# Patient Record
Sex: Female | Born: 1935 | Race: White | Hispanic: No | Marital: Married | Smoking: Never smoker
Health system: Southern US, Community
[De-identification: ages and names within clinical notes are randomized; demographics above are authoritative.]

## PROBLEM LIST (undated history)

## (undated) DIAGNOSIS — K219 Gastro-esophageal reflux disease without esophagitis: Secondary | ICD-10-CM

## (undated) DIAGNOSIS — C801 Malignant (primary) neoplasm, unspecified: Secondary | ICD-10-CM

## (undated) DIAGNOSIS — I10 Essential (primary) hypertension: Secondary | ICD-10-CM

## (undated) DIAGNOSIS — R011 Cardiac murmur, unspecified: Secondary | ICD-10-CM

## (undated) HISTORY — PX: TONSILLECTOMY: SUR1361

## (undated) HISTORY — PX: TUBAL LIGATION: SHX77

## (undated) HISTORY — PX: COLONOSCOPY W/ POLYPECTOMY: SHX1380

---

## 1984-05-28 HISTORY — PX: BREAST BIOPSY: SHX20

## 2012-01-12 DIAGNOSIS — I10 Essential (primary) hypertension: Secondary | ICD-10-CM | POA: Diagnosis not present

## 2012-01-12 DIAGNOSIS — L97909 Non-pressure chronic ulcer of unspecified part of unspecified lower leg with unspecified severity: Secondary | ICD-10-CM | POA: Diagnosis not present

## 2012-01-15 DIAGNOSIS — Z23 Encounter for immunization: Secondary | ICD-10-CM | POA: Diagnosis not present

## 2012-01-19 DIAGNOSIS — Z1231 Encounter for screening mammogram for malignant neoplasm of breast: Secondary | ICD-10-CM | POA: Diagnosis not present

## 2012-01-19 DIAGNOSIS — Z13820 Encounter for screening for osteoporosis: Secondary | ICD-10-CM | POA: Diagnosis not present

## 2012-05-24 DIAGNOSIS — M899 Disorder of bone, unspecified: Secondary | ICD-10-CM | POA: Diagnosis not present

## 2012-05-24 DIAGNOSIS — M949 Disorder of cartilage, unspecified: Secondary | ICD-10-CM | POA: Diagnosis not present

## 2012-05-24 DIAGNOSIS — I10 Essential (primary) hypertension: Secondary | ICD-10-CM | POA: Diagnosis not present

## 2012-05-24 DIAGNOSIS — E559 Vitamin D deficiency, unspecified: Secondary | ICD-10-CM | POA: Diagnosis not present

## 2012-06-22 DIAGNOSIS — I119 Hypertensive heart disease without heart failure: Secondary | ICD-10-CM | POA: Diagnosis not present

## 2012-06-22 DIAGNOSIS — I359 Nonrheumatic aortic valve disorder, unspecified: Secondary | ICD-10-CM | POA: Diagnosis not present

## 2012-08-31 DIAGNOSIS — M259 Joint disorder, unspecified: Secondary | ICD-10-CM | POA: Diagnosis not present

## 2012-09-08 DIAGNOSIS — D485 Neoplasm of uncertain behavior of skin: Secondary | ICD-10-CM | POA: Diagnosis not present

## 2012-09-08 DIAGNOSIS — C44711 Basal cell carcinoma of skin of unspecified lower limb, including hip: Secondary | ICD-10-CM | POA: Diagnosis not present

## 2012-10-05 DIAGNOSIS — L905 Scar conditions and fibrosis of skin: Secondary | ICD-10-CM | POA: Diagnosis not present

## 2012-10-05 DIAGNOSIS — C44711 Basal cell carcinoma of skin of unspecified lower limb, including hip: Secondary | ICD-10-CM | POA: Diagnosis not present

## 2012-11-18 DIAGNOSIS — I10 Essential (primary) hypertension: Secondary | ICD-10-CM | POA: Diagnosis not present

## 2012-11-18 DIAGNOSIS — E559 Vitamin D deficiency, unspecified: Secondary | ICD-10-CM | POA: Diagnosis not present

## 2012-11-18 DIAGNOSIS — E785 Hyperlipidemia, unspecified: Secondary | ICD-10-CM | POA: Diagnosis not present

## 2012-11-18 DIAGNOSIS — Z5181 Encounter for therapeutic drug level monitoring: Secondary | ICD-10-CM | POA: Diagnosis not present

## 2012-11-18 DIAGNOSIS — N390 Urinary tract infection, site not specified: Secondary | ICD-10-CM | POA: Diagnosis not present

## 2012-11-21 DIAGNOSIS — M899 Disorder of bone, unspecified: Secondary | ICD-10-CM | POA: Diagnosis not present

## 2012-11-21 DIAGNOSIS — M949 Disorder of cartilage, unspecified: Secondary | ICD-10-CM | POA: Diagnosis not present

## 2012-11-21 DIAGNOSIS — I10 Essential (primary) hypertension: Secondary | ICD-10-CM | POA: Diagnosis not present

## 2013-02-20 DIAGNOSIS — M899 Disorder of bone, unspecified: Secondary | ICD-10-CM | POA: Diagnosis not present

## 2013-02-20 DIAGNOSIS — E559 Vitamin D deficiency, unspecified: Secondary | ICD-10-CM | POA: Diagnosis not present

## 2013-03-13 DIAGNOSIS — Z1231 Encounter for screening mammogram for malignant neoplasm of breast: Secondary | ICD-10-CM | POA: Diagnosis not present

## 2013-03-15 DIAGNOSIS — D126 Benign neoplasm of colon, unspecified: Secondary | ICD-10-CM | POA: Diagnosis not present

## 2013-03-15 DIAGNOSIS — Z1211 Encounter for screening for malignant neoplasm of colon: Secondary | ICD-10-CM | POA: Diagnosis not present

## 2013-05-11 DIAGNOSIS — I789 Disease of capillaries, unspecified: Secondary | ICD-10-CM | POA: Diagnosis not present

## 2013-05-11 DIAGNOSIS — D1801 Hemangioma of skin and subcutaneous tissue: Secondary | ICD-10-CM | POA: Diagnosis not present

## 2013-05-11 DIAGNOSIS — L570 Actinic keratosis: Secondary | ICD-10-CM | POA: Diagnosis not present

## 2013-05-11 DIAGNOSIS — L723 Sebaceous cyst: Secondary | ICD-10-CM | POA: Diagnosis not present

## 2013-06-26 DIAGNOSIS — H6120 Impacted cerumen, unspecified ear: Secondary | ICD-10-CM | POA: Diagnosis not present

## 2013-06-28 DIAGNOSIS — I359 Nonrheumatic aortic valve disorder, unspecified: Secondary | ICD-10-CM | POA: Diagnosis not present

## 2013-06-28 DIAGNOSIS — I10 Essential (primary) hypertension: Secondary | ICD-10-CM | POA: Diagnosis not present

## 2013-06-28 DIAGNOSIS — I059 Rheumatic mitral valve disease, unspecified: Secondary | ICD-10-CM | POA: Diagnosis not present

## 2013-08-30 DIAGNOSIS — H52209 Unspecified astigmatism, unspecified eye: Secondary | ICD-10-CM | POA: Diagnosis not present

## 2013-08-30 DIAGNOSIS — H903 Sensorineural hearing loss, bilateral: Secondary | ICD-10-CM | POA: Diagnosis not present

## 2013-08-30 DIAGNOSIS — H902 Conductive hearing loss, unspecified: Secondary | ICD-10-CM | POA: Diagnosis not present

## 2013-08-30 DIAGNOSIS — H5200 Hypermetropia, unspecified eye: Secondary | ICD-10-CM | POA: Diagnosis not present

## 2013-08-30 DIAGNOSIS — H9319 Tinnitus, unspecified ear: Secondary | ICD-10-CM | POA: Diagnosis not present

## 2013-08-30 DIAGNOSIS — H6120 Impacted cerumen, unspecified ear: Secondary | ICD-10-CM | POA: Diagnosis not present

## 2013-08-30 DIAGNOSIS — H524 Presbyopia: Secondary | ICD-10-CM | POA: Diagnosis not present

## 2013-08-30 DIAGNOSIS — H2510 Age-related nuclear cataract, unspecified eye: Secondary | ICD-10-CM | POA: Diagnosis not present

## 2013-11-16 DIAGNOSIS — M899 Disorder of bone, unspecified: Secondary | ICD-10-CM | POA: Diagnosis not present

## 2013-11-16 DIAGNOSIS — I10 Essential (primary) hypertension: Secondary | ICD-10-CM | POA: Diagnosis not present

## 2013-11-16 DIAGNOSIS — I359 Nonrheumatic aortic valve disorder, unspecified: Secondary | ICD-10-CM | POA: Diagnosis not present

## 2013-11-16 DIAGNOSIS — Z5181 Encounter for therapeutic drug level monitoring: Secondary | ICD-10-CM | POA: Diagnosis not present

## 2013-11-29 DIAGNOSIS — M899 Disorder of bone, unspecified: Secondary | ICD-10-CM | POA: Diagnosis not present

## 2013-11-29 DIAGNOSIS — H6120 Impacted cerumen, unspecified ear: Secondary | ICD-10-CM | POA: Diagnosis not present

## 2013-11-29 DIAGNOSIS — I359 Nonrheumatic aortic valve disorder, unspecified: Secondary | ICD-10-CM | POA: Diagnosis not present

## 2013-11-29 DIAGNOSIS — Z23 Encounter for immunization: Secondary | ICD-10-CM | POA: Diagnosis not present

## 2013-11-29 DIAGNOSIS — D126 Benign neoplasm of colon, unspecified: Secondary | ICD-10-CM | POA: Diagnosis not present

## 2013-11-29 DIAGNOSIS — I10 Essential (primary) hypertension: Secondary | ICD-10-CM | POA: Diagnosis not present

## 2013-11-29 DIAGNOSIS — E559 Vitamin D deficiency, unspecified: Secondary | ICD-10-CM | POA: Diagnosis not present

## 2014-05-09 DIAGNOSIS — B351 Tinea unguium: Secondary | ICD-10-CM | POA: Diagnosis not present

## 2014-05-09 DIAGNOSIS — L570 Actinic keratosis: Secondary | ICD-10-CM | POA: Diagnosis not present

## 2014-05-09 DIAGNOSIS — D1801 Hemangioma of skin and subcutaneous tissue: Secondary | ICD-10-CM | POA: Diagnosis not present

## 2014-11-07 ENCOUNTER — Other Ambulatory Visit: Payer: Self-pay | Admitting: Internal Medicine

## 2014-11-07 DIAGNOSIS — I351 Nonrheumatic aortic (valve) insufficiency: Secondary | ICD-10-CM | POA: Diagnosis not present

## 2014-11-07 DIAGNOSIS — I10 Essential (primary) hypertension: Secondary | ICD-10-CM | POA: Diagnosis not present

## 2014-11-07 DIAGNOSIS — Z803 Family history of malignant neoplasm of breast: Secondary | ICD-10-CM | POA: Diagnosis not present

## 2014-11-07 DIAGNOSIS — Z23 Encounter for immunization: Secondary | ICD-10-CM | POA: Diagnosis not present

## 2014-11-07 DIAGNOSIS — Z1231 Encounter for screening mammogram for malignant neoplasm of breast: Secondary | ICD-10-CM

## 2014-12-06 ENCOUNTER — Ambulatory Visit
Admission: RE | Admit: 2014-12-06 | Discharge: 2014-12-06 | Disposition: A | Discharge status: Home / Self Care | Payer: Medicare Other | Source: Ambulatory Visit | Attending: Internal Medicine | Admitting: Internal Medicine

## 2014-12-06 ENCOUNTER — Other Ambulatory Visit: Payer: Self-pay | Admitting: Internal Medicine

## 2014-12-06 DIAGNOSIS — Z1231 Encounter for screening mammogram for malignant neoplasm of breast: Secondary | ICD-10-CM

## 2015-02-26 DIAGNOSIS — H2513 Age-related nuclear cataract, bilateral: Secondary | ICD-10-CM | POA: Diagnosis not present

## 2015-03-19 DIAGNOSIS — I10 Essential (primary) hypertension: Secondary | ICD-10-CM | POA: Diagnosis not present

## 2015-03-19 DIAGNOSIS — Z79899 Other long term (current) drug therapy: Secondary | ICD-10-CM | POA: Diagnosis not present

## 2015-03-19 DIAGNOSIS — Z803 Family history of malignant neoplasm of breast: Secondary | ICD-10-CM | POA: Diagnosis not present

## 2015-03-19 DIAGNOSIS — I351 Nonrheumatic aortic (valve) insufficiency: Secondary | ICD-10-CM | POA: Diagnosis not present

## 2015-03-19 DIAGNOSIS — Z0001 Encounter for general adult medical examination with abnormal findings: Secondary | ICD-10-CM | POA: Diagnosis not present

## 2015-03-19 DIAGNOSIS — Z1389 Encounter for screening for other disorder: Secondary | ICD-10-CM | POA: Diagnosis not present

## 2015-03-19 DIAGNOSIS — E559 Vitamin D deficiency, unspecified: Secondary | ICD-10-CM | POA: Diagnosis not present

## 2015-03-19 DIAGNOSIS — K635 Polyp of colon: Secondary | ICD-10-CM | POA: Diagnosis not present

## 2015-05-16 ENCOUNTER — Other Ambulatory Visit: Payer: Self-pay | Admitting: Gastroenterology

## 2015-07-30 ENCOUNTER — Encounter (HOSPITAL_COMMUNITY): Payer: Self-pay | Admitting: *Deleted

## 2015-08-06 ENCOUNTER — Ambulatory Visit (HOSPITAL_COMMUNITY)
Admission: RE | Admit: 2015-08-06 | Discharge: 2015-08-06 | Disposition: A | Discharge status: Home / Self Care | Payer: Medicare Other | Source: Ambulatory Visit | Attending: Gastroenterology | Admitting: Gastroenterology

## 2015-08-06 ENCOUNTER — Ambulatory Visit (HOSPITAL_COMMUNITY): Payer: Medicare Other | Admitting: Anesthesiology

## 2015-08-06 ENCOUNTER — Encounter (HOSPITAL_COMMUNITY)
Admission: RE | Disposition: A | Discharge status: Home / Self Care | Payer: Self-pay | Source: Ambulatory Visit | Attending: Gastroenterology

## 2015-08-06 ENCOUNTER — Encounter (HOSPITAL_COMMUNITY): Payer: Self-pay

## 2015-08-06 DIAGNOSIS — I351 Nonrheumatic aortic (valve) insufficiency: Secondary | ICD-10-CM | POA: Diagnosis not present

## 2015-08-06 DIAGNOSIS — Z86010 Personal history of colonic polyps: Secondary | ICD-10-CM | POA: Insufficient documentation

## 2015-08-06 DIAGNOSIS — I10 Essential (primary) hypertension: Secondary | ICD-10-CM | POA: Insufficient documentation

## 2015-08-06 DIAGNOSIS — K219 Gastro-esophageal reflux disease without esophagitis: Secondary | ICD-10-CM | POA: Insufficient documentation

## 2015-08-06 DIAGNOSIS — Z1211 Encounter for screening for malignant neoplasm of colon: Secondary | ICD-10-CM | POA: Diagnosis not present

## 2015-08-06 HISTORY — DX: Cardiac murmur, unspecified: R01.1

## 2015-08-06 HISTORY — DX: Malignant (primary) neoplasm, unspecified: C80.1

## 2015-08-06 HISTORY — DX: Essential (primary) hypertension: I10

## 2015-08-06 HISTORY — DX: Gastro-esophageal reflux disease without esophagitis: K21.9

## 2015-08-06 HISTORY — PX: COLONOSCOPY WITH PROPOFOL: SHX5780

## 2015-08-06 SURGERY — COLONOSCOPY WITH PROPOFOL
Anesthesia: Monitor Anesthesia Care

## 2015-08-06 MED ORDER — PROPOFOL 10 MG/ML IV BOLUS
INTRAVENOUS | Status: DC | PRN
  Administered 2015-08-06: 100 mg via INTRAVENOUS
  Administered 2015-08-06 (×2): 50 mg via INTRAVENOUS

## 2015-08-06 MED ORDER — LACTATED RINGERS IV SOLN
INTRAVENOUS | Status: DC
  Administered 2015-08-06: 11:00:00 via INTRAVENOUS
  Administered 2015-08-06: 1000 mL via INTRAVENOUS

## 2015-08-06 MED ORDER — PROPOFOL 10 MG/ML IV BOLUS
INTRAVENOUS | Status: AC
  Filled 2015-08-06: qty 20

## 2015-08-06 MED ORDER — SODIUM CHLORIDE 0.9 % IV SOLN: INTRAVENOUS | Status: DC

## 2015-08-06 SURGICAL SUPPLY — 21 items

## 2015-08-06 NOTE — Op Note (Signed)
Procedure: Surveillance colonoscopy. 03/15/2013 colonoscopy performed with removal of a 15 mm sigmoid colon tubulovillous adenomatous polyp  Endoscopist: Earle Gell  Premedication: Propofol administered by anesthesia  Procedure: The patient was placed in the left lateral decubitus position. Anal inspection and digital rectal exam were normal. The Pentax pediatric colonoscope was introduced into the rectum and advanced to the cecum. A normal-appearing appendiceal orifice and ileocecal valve were identified. Colonic preparation for the exam today was good. Withdrawal time was 10 minutes  Rectum. Normal. Retroflex view of the distal rectum was normal  Sigmoid colon and descending colon. Normal  Splenic flexure. Normal  Transverse colon. Normal  Hepatic flexure. Normal  Ascending colon. Normal  Cecum and ileocecal valve. Normal  Assessment: Normal surveillance colonoscopy.

## 2015-08-06 NOTE — Anesthesia Preprocedure Evaluation (Addendum)
Anesthesia Evaluation  Patient identified by MRN, date of birth, ID band Patient awake    Reviewed: Allergy & Precautions, NPO status , Patient's Chart, lab work & pertinent test results  History of Anesthesia Complications Negative for: history of anesthetic complications  Airway Mallampati: II  TM Distance: >3 FB Neck ROM: Full    Dental no notable dental hx. (+) Dental Advisory Given   Pulmonary neg pulmonary ROS,  breath sounds clear to auscultation  Pulmonary exam normal       Cardiovascular hypertension, Pt. on medications Normal cardiovascular examRhythm:Regular Rate:Normal     Neuro/Psych negative neurological ROS  negative psych ROS   GI/Hepatic Neg liver ROS, GERD-  Medicated,  Endo/Other  negative endocrine ROS  Renal/GU negative Renal ROS  negative genitourinary   Musculoskeletal negative musculoskeletal ROS (+)   Abdominal   Peds negative pediatric ROS (+)  Hematology negative hematology ROS (+)   Anesthesia Other Findings   Reproductive/Obstetrics negative OB ROS                           Anesthesia Physical Anesthesia Plan  ASA: II  Anesthesia Plan: MAC   Post-op Pain Management:    Induction: Intravenous  Airway Management Planned: Nasal Cannula  Additional Equipment:   Intra-op Plan:   Post-operative Plan: Extubation in OR  Informed Consent: I have reviewed the patients History and Physical, chart, labs and discussed the procedure including the risks, benefits and alternatives for the proposed anesthesia with the patient or authorized representative who has indicated his/her understanding and acceptance.   Dental advisory given  Plan Discussed with: CRNA  Anesthesia Plan Comments:         Anesthesia Quick Evaluation

## 2015-08-06 NOTE — Anesthesia Postprocedure Evaluation (Signed)
  Anesthesia Post-op Note  Patient: Tracy Preston  Procedure(s) Performed: Procedure(s) (LRB): COLONOSCOPY WITH PROPOFOL (N/A)  Patient Location: PACU  Anesthesia Type: MAC  Level of Consciousness: awake and alert   Airway and Oxygen Therapy: Patient Spontanous Breathing  Post-op Pain: mild  Post-op Assessment: Post-op Vital signs reviewed, Patient's Cardiovascular Status Stable, Respiratory Function Stable, Patent Airway and No signs of Nausea or vomiting  Last Vitals:  Filed Vitals:   08/06/15 1210  BP: 106/42  Pulse: 62  Temp:   Resp: 14    Post-op Vital Signs: stable   Complications: No apparent anesthesia complications

## 2015-08-06 NOTE — Transfer of Care (Signed)
Immediate Anesthesia Transfer of Care Note  Patient: Tracy Preston  Procedure(s) Performed: Procedure(s): COLONOSCOPY WITH PROPOFOL (N/A)  Patient Location: PACU  Anesthesia Type:MAC  Level of Consciousness:  sedated, patient cooperative and responds to stimulation  Airway & Oxygen Therapy:Patient Spontanous Breathing   Post-op Assessment:  Report given to PACU RN and Post -op Vital signs reviewed and stable  Post vital signs:  Reviewed and stable  Last Vitals:  Filed Vitals:   08/06/15 1105  BP: 151/71  Pulse: 75  Temp: 36.7 C  Resp: 24    Complications: No apparent anesthesia complications

## 2015-08-06 NOTE — H&P (Signed)
  Procedure: Surveillance colonoscopy. 03/15/2013 colonoscopy performed with removal of a 15 mm sigmoid colon tubulovillous adenomatous polyp in [place], [place]  History: The patient is a 80 year old female born August 02, 1935. She is scheduled to undergo a surveillance colonoscopy today.  Past medical history: Tubal ligation. Hypertension. Aortic valve insufficiency.  Exam: The patient is alert and lying comfortably on the endoscopy stretcher. Abdomen is soft and nontender to palpation. Lungs are clear to auscultation. Cardiac exam reveals a regular rhythm. She has been diagnosed with aortic valve insufficiency.  Plan: Proceed with surveillance colonoscopy

## 2015-08-08 ENCOUNTER — Encounter (HOSPITAL_COMMUNITY): Payer: Self-pay | Admitting: Gastroenterology

## 2015-11-08 DIAGNOSIS — Z23 Encounter for immunization: Secondary | ICD-10-CM | POA: Diagnosis not present

## 2016-01-16 ENCOUNTER — Other Ambulatory Visit: Payer: Self-pay

## 2016-01-16 DIAGNOSIS — Z1231 Encounter for screening mammogram for malignant neoplasm of breast: Secondary | ICD-10-CM

## 2016-02-24 ENCOUNTER — Ambulatory Visit
Admission: RE | Admit: 2016-02-24 | Discharge: 2016-02-24 | Disposition: A | Discharge status: Home / Self Care | Payer: Medicare Other | Source: Ambulatory Visit

## 2016-02-24 DIAGNOSIS — Z1231 Encounter for screening mammogram for malignant neoplasm of breast: Secondary | ICD-10-CM | POA: Diagnosis not present

## 2016-03-27 DIAGNOSIS — Z803 Family history of malignant neoplasm of breast: Secondary | ICD-10-CM | POA: Diagnosis not present

## 2016-03-27 DIAGNOSIS — Z23 Encounter for immunization: Secondary | ICD-10-CM | POA: Diagnosis not present

## 2016-03-27 DIAGNOSIS — I351 Nonrheumatic aortic (valve) insufficiency: Secondary | ICD-10-CM | POA: Diagnosis not present

## 2016-03-27 DIAGNOSIS — Z79899 Other long term (current) drug therapy: Secondary | ICD-10-CM | POA: Diagnosis not present

## 2016-03-27 DIAGNOSIS — Z86010 Personal history of colonic polyps: Secondary | ICD-10-CM | POA: Diagnosis not present

## 2016-03-27 DIAGNOSIS — L820 Inflamed seborrheic keratosis: Secondary | ICD-10-CM | POA: Diagnosis not present

## 2016-03-27 DIAGNOSIS — I10 Essential (primary) hypertension: Secondary | ICD-10-CM | POA: Diagnosis not present

## 2016-03-27 DIAGNOSIS — Z1389 Encounter for screening for other disorder: Secondary | ICD-10-CM | POA: Diagnosis not present

## 2016-03-27 DIAGNOSIS — E559 Vitamin D deficiency, unspecified: Secondary | ICD-10-CM | POA: Diagnosis not present

## 2016-03-27 DIAGNOSIS — K635 Polyp of colon: Secondary | ICD-10-CM | POA: Diagnosis not present

## 2016-03-27 DIAGNOSIS — Z0001 Encounter for general adult medical examination with abnormal findings: Secondary | ICD-10-CM | POA: Diagnosis not present

## 2016-10-02 DIAGNOSIS — H353111 Nonexudative age-related macular degeneration, right eye, early dry stage: Secondary | ICD-10-CM | POA: Diagnosis not present

## 2016-10-02 DIAGNOSIS — H353121 Nonexudative age-related macular degeneration, left eye, early dry stage: Secondary | ICD-10-CM | POA: Diagnosis not present

## 2016-10-02 DIAGNOSIS — H2513 Age-related nuclear cataract, bilateral: Secondary | ICD-10-CM | POA: Diagnosis not present

## 2016-10-02 DIAGNOSIS — H524 Presbyopia: Secondary | ICD-10-CM | POA: Diagnosis not present

## 2016-10-27 DIAGNOSIS — H2513 Age-related nuclear cataract, bilateral: Secondary | ICD-10-CM | POA: Diagnosis not present

## 2016-10-27 DIAGNOSIS — H2511 Age-related nuclear cataract, right eye: Secondary | ICD-10-CM | POA: Diagnosis not present

## 2016-10-27 DIAGNOSIS — H25811 Combined forms of age-related cataract, right eye: Secondary | ICD-10-CM | POA: Diagnosis not present

## 2016-11-10 DIAGNOSIS — Z23 Encounter for immunization: Secondary | ICD-10-CM | POA: Diagnosis not present

## 2016-11-17 DIAGNOSIS — H25812 Combined forms of age-related cataract, left eye: Secondary | ICD-10-CM | POA: Diagnosis not present

## 2016-11-17 DIAGNOSIS — H2513 Age-related nuclear cataract, bilateral: Secondary | ICD-10-CM | POA: Diagnosis not present

## 2016-11-17 DIAGNOSIS — H2512 Age-related nuclear cataract, left eye: Secondary | ICD-10-CM | POA: Diagnosis not present

## 2017-01-19 DIAGNOSIS — L820 Inflamed seborrheic keratosis: Secondary | ICD-10-CM | POA: Diagnosis not present

## 2017-03-03 ENCOUNTER — Other Ambulatory Visit: Payer: Self-pay | Admitting: Internal Medicine

## 2017-03-03 DIAGNOSIS — Z1231 Encounter for screening mammogram for malignant neoplasm of breast: Secondary | ICD-10-CM

## 2017-03-26 ENCOUNTER — Ambulatory Visit
Admission: RE | Admit: 2017-03-26 | Discharge: 2017-03-26 | Disposition: A | Discharge status: Home / Self Care | Payer: Medicare Other | Source: Ambulatory Visit | Attending: Internal Medicine | Admitting: Internal Medicine

## 2017-03-26 DIAGNOSIS — Z1231 Encounter for screening mammogram for malignant neoplasm of breast: Secondary | ICD-10-CM

## 2017-04-13 DIAGNOSIS — L820 Inflamed seborrheic keratosis: Secondary | ICD-10-CM | POA: Diagnosis not present

## 2017-04-13 DIAGNOSIS — Z803 Family history of malignant neoplasm of breast: Secondary | ICD-10-CM | POA: Diagnosis not present

## 2017-04-13 DIAGNOSIS — Z0000 Encounter for general adult medical examination without abnormal findings: Secondary | ICD-10-CM | POA: Diagnosis not present

## 2017-04-13 DIAGNOSIS — E559 Vitamin D deficiency, unspecified: Secondary | ICD-10-CM | POA: Diagnosis not present

## 2017-04-13 DIAGNOSIS — K635 Polyp of colon: Secondary | ICD-10-CM | POA: Diagnosis not present

## 2017-04-13 DIAGNOSIS — Z79899 Other long term (current) drug therapy: Secondary | ICD-10-CM | POA: Diagnosis not present

## 2017-04-13 DIAGNOSIS — Z1389 Encounter for screening for other disorder: Secondary | ICD-10-CM | POA: Diagnosis not present

## 2017-04-13 DIAGNOSIS — I351 Nonrheumatic aortic (valve) insufficiency: Secondary | ICD-10-CM | POA: Diagnosis not present

## 2017-04-13 DIAGNOSIS — I10 Essential (primary) hypertension: Secondary | ICD-10-CM | POA: Diagnosis not present

## 2017-04-13 DIAGNOSIS — Z86010 Personal history of colonic polyps: Secondary | ICD-10-CM | POA: Diagnosis not present

## 2017-11-16 DIAGNOSIS — Z23 Encounter for immunization: Secondary | ICD-10-CM | POA: Diagnosis not present

## 2018-03-09 ENCOUNTER — Other Ambulatory Visit: Payer: Self-pay | Admitting: Internal Medicine

## 2018-03-09 DIAGNOSIS — Z1231 Encounter for screening mammogram for malignant neoplasm of breast: Secondary | ICD-10-CM

## 2018-04-07 ENCOUNTER — Ambulatory Visit
Admission: RE | Admit: 2018-04-07 | Discharge: 2018-04-07 | Disposition: A | Discharge status: Home / Self Care | Payer: Medicare Other | Source: Ambulatory Visit | Attending: Internal Medicine | Admitting: Internal Medicine

## 2018-04-07 DIAGNOSIS — Z1231 Encounter for screening mammogram for malignant neoplasm of breast: Secondary | ICD-10-CM

## 2018-04-20 DIAGNOSIS — Z0000 Encounter for general adult medical examination without abnormal findings: Secondary | ICD-10-CM | POA: Diagnosis not present

## 2018-04-20 DIAGNOSIS — E559 Vitamin D deficiency, unspecified: Secondary | ICD-10-CM | POA: Diagnosis not present

## 2018-04-20 DIAGNOSIS — Z803 Family history of malignant neoplasm of breast: Secondary | ICD-10-CM | POA: Diagnosis not present

## 2018-04-20 DIAGNOSIS — I351 Nonrheumatic aortic (valve) insufficiency: Secondary | ICD-10-CM | POA: Diagnosis not present

## 2018-04-20 DIAGNOSIS — Z86010 Personal history of colonic polyps: Secondary | ICD-10-CM | POA: Diagnosis not present

## 2018-04-20 DIAGNOSIS — Z1389 Encounter for screening for other disorder: Secondary | ICD-10-CM | POA: Diagnosis not present

## 2018-04-20 DIAGNOSIS — L820 Inflamed seborrheic keratosis: Secondary | ICD-10-CM | POA: Diagnosis not present

## 2018-04-20 DIAGNOSIS — I10 Essential (primary) hypertension: Secondary | ICD-10-CM | POA: Diagnosis not present

## 2018-04-20 DIAGNOSIS — Z79899 Other long term (current) drug therapy: Secondary | ICD-10-CM | POA: Diagnosis not present

## 2018-04-20 DIAGNOSIS — K635 Polyp of colon: Secondary | ICD-10-CM | POA: Diagnosis not present

## 2018-08-12 IMAGING — MG 2D DIGITAL SCREENING BILATERAL MAMMOGRAM WITH CAD AND ADJUNCT TO
7 series · 9 of 19 positions shown · non-contrast
Comparison: Previous exam(s).

CLINICAL DATA: Screening.

EXAM:
2D DIGITAL SCREENING BILATERAL MAMMOGRAM WITH CAD AND ADJUNCT TOMO

[R CC]
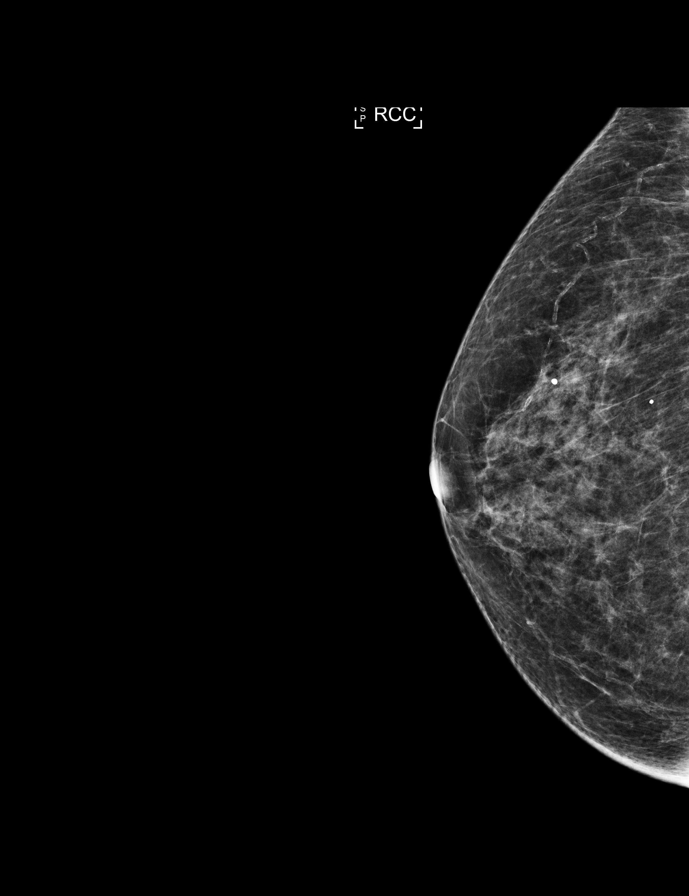

[L CC synth-2D]
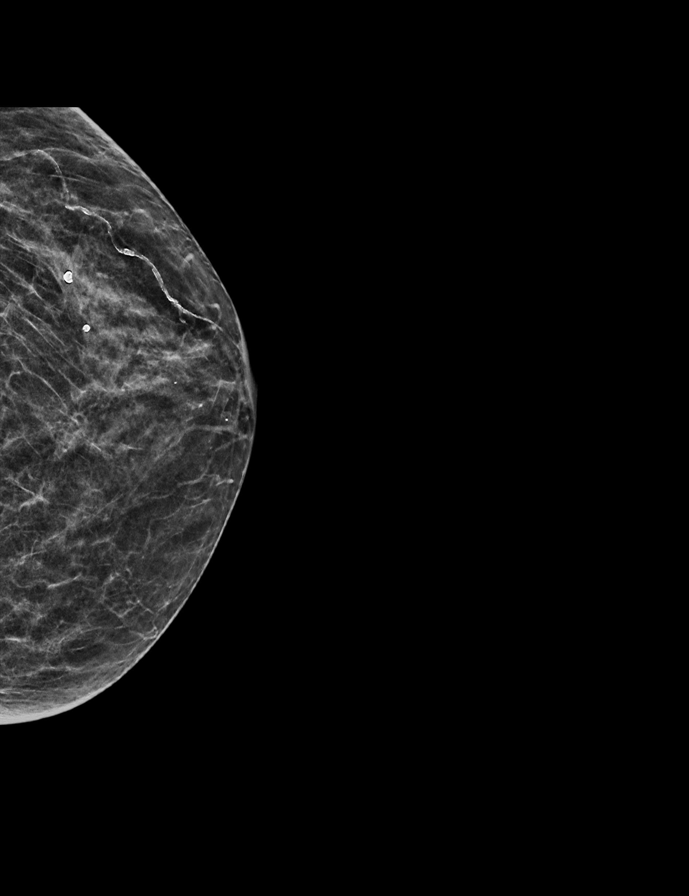

[R MLO synth-2D]
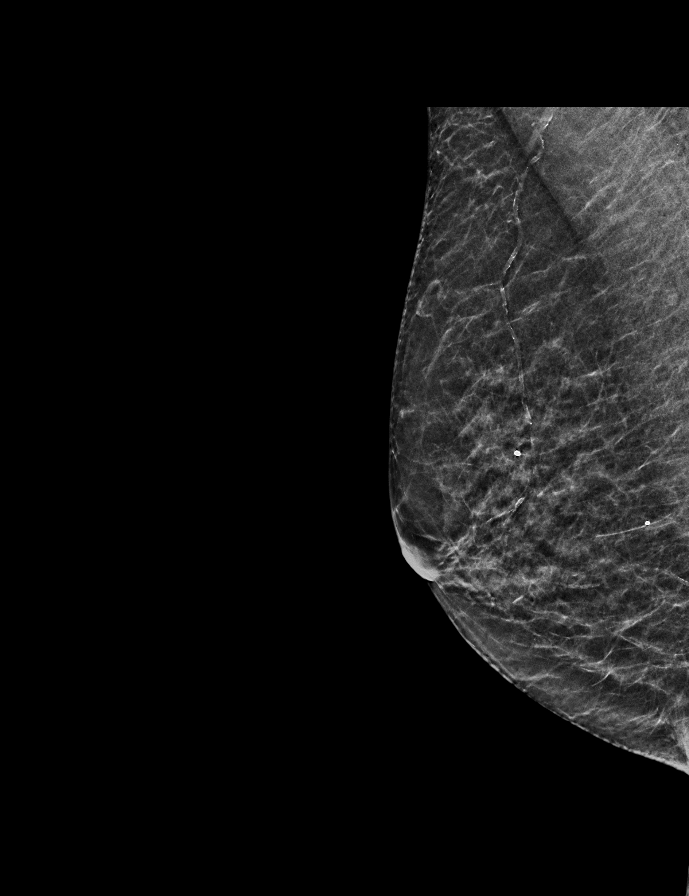

[R CC synth-2D]
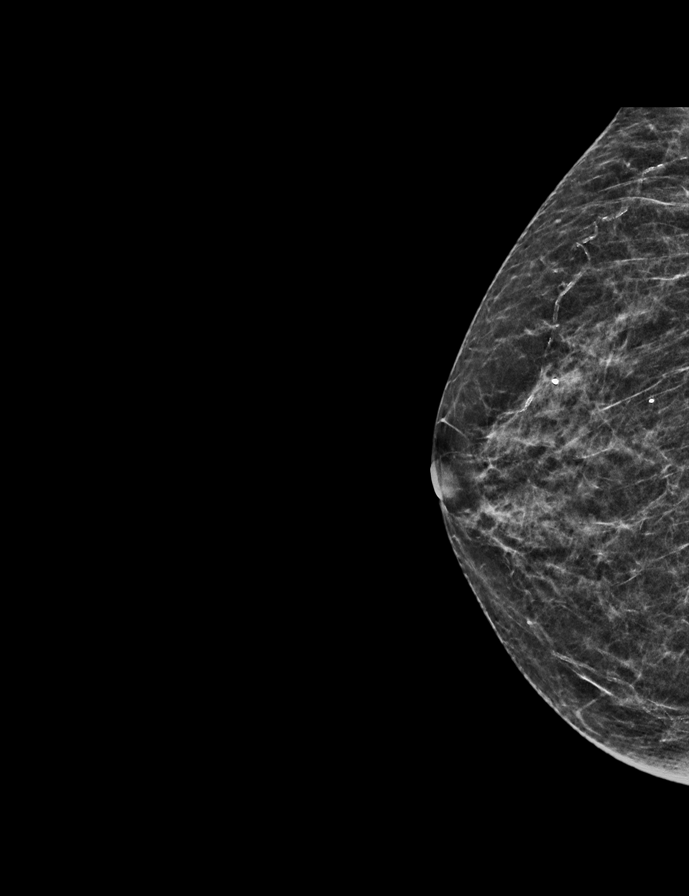

[R MLO tomo · 3 of 42 frames shown]
[frame 14/42]
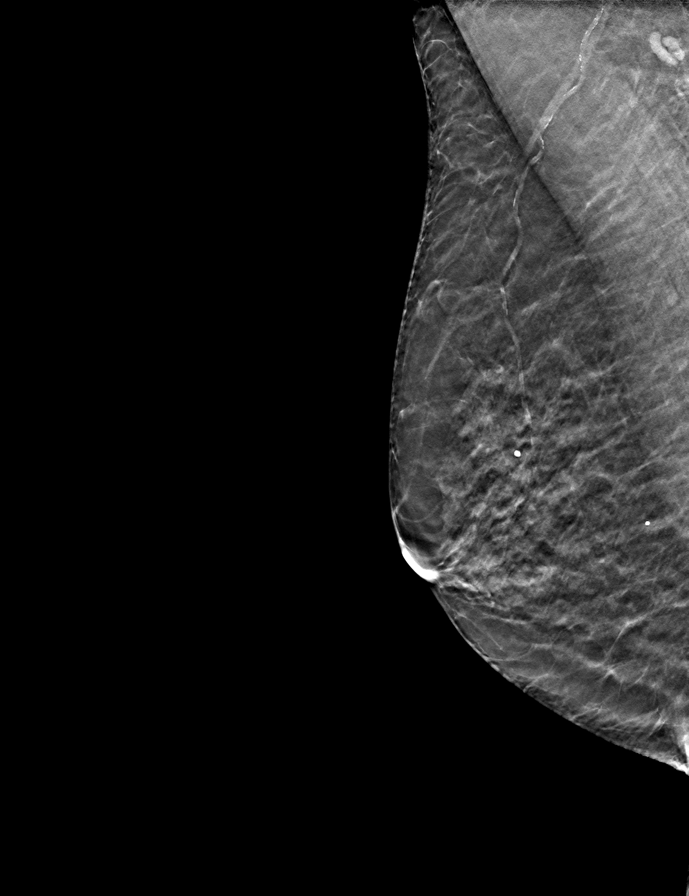
[frame 21/42]
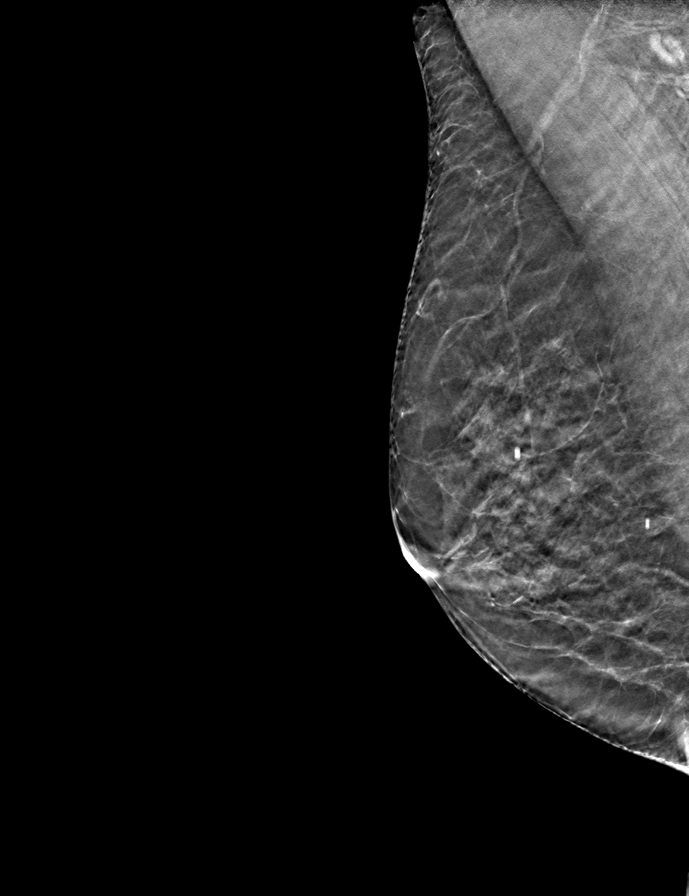
[frame 29/42]
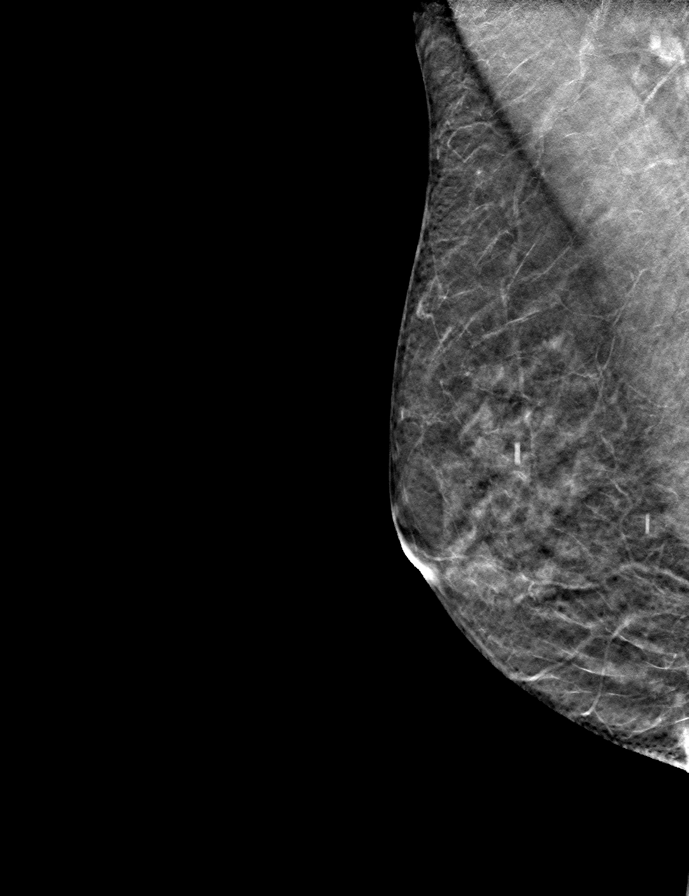

[L MLO tomo · tomo slice 22/43.0]
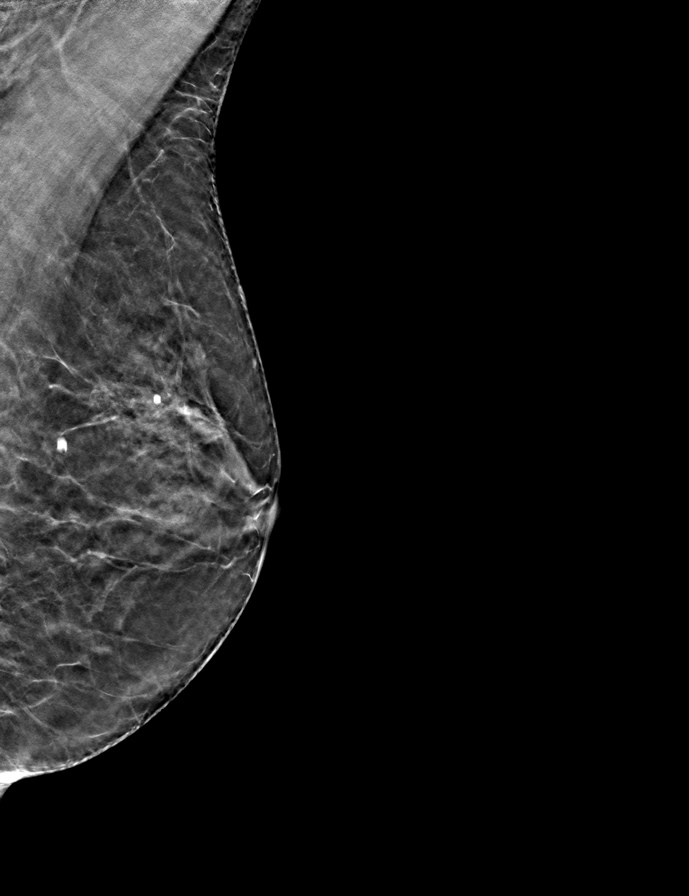

[L CC tomo · tomo slice 23/45.0]
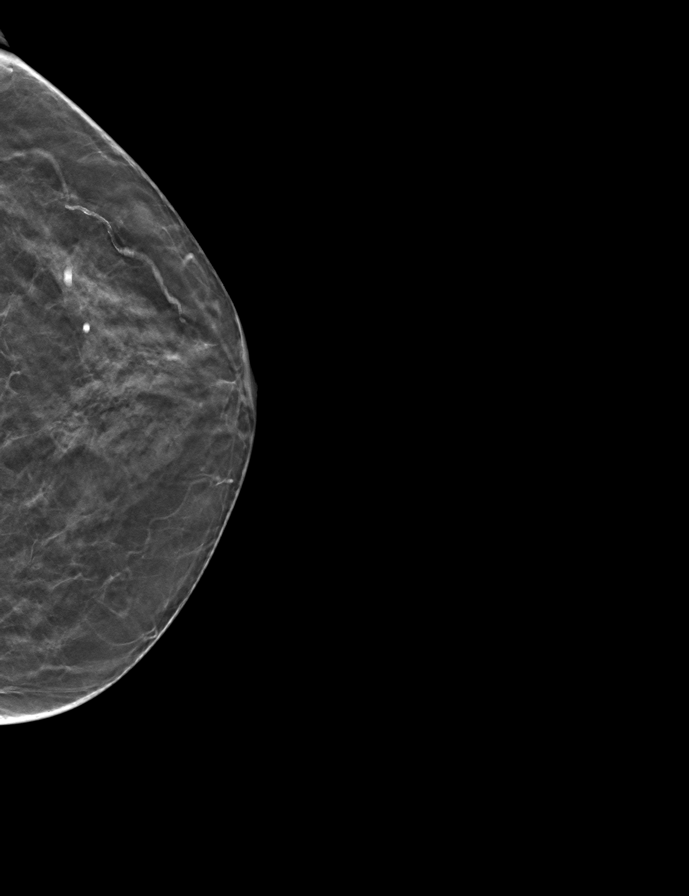

[9 of 19 positions shown; findings below may reference images not displayed]

ACR Breast Density Category c: The breast tissue is heterogeneously
dense, which may obscure small masses.
FINDINGS: There are no findings suspicious for malignancy. Images were
processed with CAD.
IMPRESSION: No mammographic evidence of malignancy. A result letter of this
screening mammogram will be mailed directly to the patient.

RECOMMENDATION:
Screening mammogram in one year. (Code:[id number])

BI-RADS CATEGORY  1: Negative.

## 2018-09-08 DIAGNOSIS — Z23 Encounter for immunization: Secondary | ICD-10-CM | POA: Diagnosis not present

## 2019-03-14 ENCOUNTER — Other Ambulatory Visit: Payer: Self-pay | Admitting: Internal Medicine

## 2019-03-14 DIAGNOSIS — Z1231 Encounter for screening mammogram for malignant neoplasm of breast: Secondary | ICD-10-CM

## 2019-04-13 ENCOUNTER — Ambulatory Visit: Payer: Medicare Other

## 2019-04-26 DIAGNOSIS — I10 Essential (primary) hypertension: Secondary | ICD-10-CM | POA: Diagnosis not present

## 2019-04-26 DIAGNOSIS — Z79899 Other long term (current) drug therapy: Secondary | ICD-10-CM | POA: Diagnosis not present

## 2019-04-26 DIAGNOSIS — Z803 Family history of malignant neoplasm of breast: Secondary | ICD-10-CM | POA: Diagnosis not present

## 2019-04-26 DIAGNOSIS — Z1389 Encounter for screening for other disorder: Secondary | ICD-10-CM | POA: Diagnosis not present

## 2019-04-26 DIAGNOSIS — K635 Polyp of colon: Secondary | ICD-10-CM | POA: Diagnosis not present

## 2019-04-26 DIAGNOSIS — Z0000 Encounter for general adult medical examination without abnormal findings: Secondary | ICD-10-CM | POA: Diagnosis not present

## 2019-04-26 DIAGNOSIS — Z86010 Personal history of colonic polyps: Secondary | ICD-10-CM | POA: Diagnosis not present

## 2019-04-26 DIAGNOSIS — I351 Nonrheumatic aortic (valve) insufficiency: Secondary | ICD-10-CM | POA: Diagnosis not present

## 2019-04-26 DIAGNOSIS — E559 Vitamin D deficiency, unspecified: Secondary | ICD-10-CM | POA: Diagnosis not present

## 2019-06-02 ENCOUNTER — Ambulatory Visit: Payer: Medicare Other

## 2019-06-02 ENCOUNTER — Other Ambulatory Visit: Payer: Self-pay

## 2019-06-02 ENCOUNTER — Ambulatory Visit
Admission: RE | Admit: 2019-06-02 | Discharge: 2019-06-02 | Disposition: A | Discharge status: Home / Self Care | Payer: Medicare Other | Source: Ambulatory Visit | Attending: Internal Medicine | Admitting: Internal Medicine

## 2019-06-02 DIAGNOSIS — Z1231 Encounter for screening mammogram for malignant neoplasm of breast: Secondary | ICD-10-CM | POA: Diagnosis not present

## 2019-06-02 DIAGNOSIS — Z79899 Other long term (current) drug therapy: Secondary | ICD-10-CM | POA: Diagnosis not present

## 2019-06-02 DIAGNOSIS — I351 Nonrheumatic aortic (valve) insufficiency: Secondary | ICD-10-CM | POA: Diagnosis not present

## 2019-06-02 DIAGNOSIS — I10 Essential (primary) hypertension: Secondary | ICD-10-CM | POA: Diagnosis not present

## 2019-06-02 DIAGNOSIS — E559 Vitamin D deficiency, unspecified: Secondary | ICD-10-CM | POA: Diagnosis not present

## 2019-10-04 DIAGNOSIS — Z23 Encounter for immunization: Secondary | ICD-10-CM | POA: Diagnosis not present

## 2019-10-23 DIAGNOSIS — L821 Other seborrheic keratosis: Secondary | ICD-10-CM | POA: Diagnosis not present

## 2019-10-23 DIAGNOSIS — I351 Nonrheumatic aortic (valve) insufficiency: Secondary | ICD-10-CM | POA: Diagnosis not present

## 2019-10-23 DIAGNOSIS — I10 Essential (primary) hypertension: Secondary | ICD-10-CM | POA: Diagnosis not present

## 2019-10-23 DIAGNOSIS — R739 Hyperglycemia, unspecified: Secondary | ICD-10-CM | POA: Diagnosis not present

## 2019-10-23 DIAGNOSIS — E559 Vitamin D deficiency, unspecified: Secondary | ICD-10-CM | POA: Diagnosis not present

## 2019-11-10 DIAGNOSIS — H5213 Myopia, bilateral: Secondary | ICD-10-CM | POA: Diagnosis not present

## 2019-11-10 DIAGNOSIS — H353132 Nonexudative age-related macular degeneration, bilateral, intermediate dry stage: Secondary | ICD-10-CM | POA: Diagnosis not present

## 2019-11-10 DIAGNOSIS — Z961 Presence of intraocular lens: Secondary | ICD-10-CM | POA: Diagnosis not present

## 2020-01-06 ENCOUNTER — Ambulatory Visit: Payer: Medicare Other | Attending: Internal Medicine

## 2020-01-06 DIAGNOSIS — Z23 Encounter for immunization: Secondary | ICD-10-CM | POA: Insufficient documentation

## 2020-01-06 NOTE — Progress Notes (Signed)
   Covid-19 Vaccination Clinic  Name:  Jakyrah Myklebust    MRN: [id number] DOB: [birth year]  01/06/2020  Ms. Floyd was observed post Covid-19 immunization for 15 minutes without incidence. She was provided with Vaccine Information Sheet and instruction to access the V-Safe system.   Ms. Renninger was instructed to call 911 with any severe reactions post vaccine: Marland Kitchen Difficulty breathing  . Swelling of your face and throat  . A fast heartbeat  . A bad rash all over your body  . Dizziness and weakness

## 2020-01-27 ENCOUNTER — Ambulatory Visit: Payer: Medicare PPO

## 2020-01-27 ENCOUNTER — Ambulatory Visit: Payer: Medicare PPO | Attending: Internal Medicine

## 2020-01-27 DIAGNOSIS — Z23 Encounter for immunization: Secondary | ICD-10-CM

## 2020-01-27 NOTE — Progress Notes (Signed)
   Covid-19 Vaccination Clinic  Name:  Tracy Preston    MRN: [id number] DOB: 1935-09-06  01/27/2020  [name] was observed post Covid-19 immunization for 15 minutes without incidence. She was provided with Vaccine Information Sheet and instruction to access the V-Safe system.   [name] was instructed to call 911 with any severe reactions post vaccine: Marland Kitchen Difficulty breathing  . Swelling of your face and throat  . A fast heartbeat  . A bad rash all over your body  . Dizziness and weakness    Immunizations Administered    Name Date Dose VIS Date Route   Pfizer COVID-19 Vaccine 01/27/2020 12:03 PM 0.3 mL 12/08/2019 Intramuscular   Manufacturer: [place]   Lot: [id number]   [place]: [id number]

## 2020-05-29 ENCOUNTER — Other Ambulatory Visit: Payer: Self-pay | Admitting: Internal Medicine

## 2020-05-29 DIAGNOSIS — Z1231 Encounter for screening mammogram for malignant neoplasm of breast: Secondary | ICD-10-CM

## 2020-06-25 ENCOUNTER — Other Ambulatory Visit: Payer: Self-pay

## 2020-06-25 ENCOUNTER — Ambulatory Visit
Admission: RE | Admit: 2020-06-25 | Discharge: 2020-06-25 | Disposition: A | Discharge status: Home / Self Care | Payer: Medicare PPO | Source: Ambulatory Visit | Attending: Internal Medicine | Admitting: Internal Medicine

## 2020-06-25 DIAGNOSIS — Z1231 Encounter for screening mammogram for malignant neoplasm of breast: Secondary | ICD-10-CM

## 2020-10-17 DIAGNOSIS — Z23 Encounter for immunization: Secondary | ICD-10-CM | POA: Diagnosis not present

## 2020-11-20 DIAGNOSIS — Z961 Presence of intraocular lens: Secondary | ICD-10-CM | POA: Diagnosis not present

## 2020-11-20 DIAGNOSIS — H353134 Nonexudative age-related macular degeneration, bilateral, advanced atrophic with subfoveal involvement: Secondary | ICD-10-CM | POA: Diagnosis not present

## 2020-11-20 DIAGNOSIS — H524 Presbyopia: Secondary | ICD-10-CM | POA: Diagnosis not present

## 2021-06-12 ENCOUNTER — Other Ambulatory Visit: Payer: Self-pay | Admitting: Internal Medicine

## 2021-06-12 DIAGNOSIS — Z1231 Encounter for screening mammogram for malignant neoplasm of breast: Secondary | ICD-10-CM

## 2021-07-10 DIAGNOSIS — R739 Hyperglycemia, unspecified: Secondary | ICD-10-CM | POA: Diagnosis not present

## 2021-07-10 DIAGNOSIS — I10 Essential (primary) hypertension: Secondary | ICD-10-CM | POA: Diagnosis not present

## 2021-07-10 DIAGNOSIS — R7309 Other abnormal glucose: Secondary | ICD-10-CM | POA: Diagnosis not present

## 2021-07-10 DIAGNOSIS — E559 Vitamin D deficiency, unspecified: Secondary | ICD-10-CM | POA: Diagnosis not present

## 2021-07-10 DIAGNOSIS — Z1389 Encounter for screening for other disorder: Secondary | ICD-10-CM | POA: Diagnosis not present

## 2021-07-10 DIAGNOSIS — I351 Nonrheumatic aortic (valve) insufficiency: Secondary | ICD-10-CM | POA: Diagnosis not present

## 2021-07-10 DIAGNOSIS — Z0001 Encounter for general adult medical examination with abnormal findings: Secondary | ICD-10-CM | POA: Diagnosis not present

## 2021-08-06 ENCOUNTER — Ambulatory Visit
Admission: RE | Admit: 2021-08-06 | Discharge: 2021-08-06 | Disposition: A | Discharge status: Home / Self Care | Payer: Medicare PPO | Source: Ambulatory Visit | Attending: Internal Medicine | Admitting: Internal Medicine

## 2021-08-06 ENCOUNTER — Other Ambulatory Visit: Payer: Self-pay

## 2021-08-06 DIAGNOSIS — Z1231 Encounter for screening mammogram for malignant neoplasm of breast: Secondary | ICD-10-CM | POA: Diagnosis not present

## 2022-01-29 DIAGNOSIS — H5213 Myopia, bilateral: Secondary | ICD-10-CM | POA: Diagnosis not present

## 2022-01-29 DIAGNOSIS — Z961 Presence of intraocular lens: Secondary | ICD-10-CM | POA: Diagnosis not present

## 2022-01-29 DIAGNOSIS — H353134 Nonexudative age-related macular degeneration, bilateral, advanced atrophic with subfoveal involvement: Secondary | ICD-10-CM | POA: Diagnosis not present

## 2022-07-21 ENCOUNTER — Other Ambulatory Visit: Payer: Self-pay | Admitting: Internal Medicine

## 2022-07-21 DIAGNOSIS — Z1231 Encounter for screening mammogram for malignant neoplasm of breast: Secondary | ICD-10-CM

## 2022-07-28 DIAGNOSIS — Z23 Encounter for immunization: Secondary | ICD-10-CM | POA: Diagnosis not present

## 2022-07-28 DIAGNOSIS — E559 Vitamin D deficiency, unspecified: Secondary | ICD-10-CM | POA: Diagnosis not present

## 2022-07-28 DIAGNOSIS — Z1389 Encounter for screening for other disorder: Secondary | ICD-10-CM | POA: Diagnosis not present

## 2022-07-28 DIAGNOSIS — Z0000 Encounter for general adult medical examination without abnormal findings: Secondary | ICD-10-CM | POA: Diagnosis not present

## 2022-07-28 DIAGNOSIS — Z1331 Encounter for screening for depression: Secondary | ICD-10-CM | POA: Diagnosis not present

## 2022-07-28 DIAGNOSIS — I10 Essential (primary) hypertension: Secondary | ICD-10-CM | POA: Diagnosis not present

## 2022-07-28 DIAGNOSIS — R739 Hyperglycemia, unspecified: Secondary | ICD-10-CM | POA: Diagnosis not present

## 2022-07-28 DIAGNOSIS — R21 Rash and other nonspecific skin eruption: Secondary | ICD-10-CM | POA: Diagnosis not present

## 2022-07-28 DIAGNOSIS — I351 Nonrheumatic aortic (valve) insufficiency: Secondary | ICD-10-CM | POA: Diagnosis not present

## 2022-08-11 ENCOUNTER — Ambulatory Visit
Admission: RE | Admit: 2022-08-11 | Discharge: 2022-08-11 | Disposition: A | Discharge status: Home / Self Care | Payer: Medicare PPO | Source: Ambulatory Visit | Attending: Internal Medicine | Admitting: Internal Medicine

## 2022-08-11 DIAGNOSIS — Z1231 Encounter for screening mammogram for malignant neoplasm of breast: Secondary | ICD-10-CM

## 2022-09-01 DIAGNOSIS — I10 Essential (primary) hypertension: Secondary | ICD-10-CM | POA: Diagnosis not present

## 2022-09-01 DIAGNOSIS — I359 Nonrheumatic aortic valve disorder, unspecified: Secondary | ICD-10-CM | POA: Diagnosis not present

## 2022-09-30 DIAGNOSIS — I359 Nonrheumatic aortic valve disorder, unspecified: Secondary | ICD-10-CM | POA: Diagnosis not present

## 2022-09-30 DIAGNOSIS — I10 Essential (primary) hypertension: Secondary | ICD-10-CM | POA: Diagnosis not present

## 2023-02-15 DIAGNOSIS — Z961 Presence of intraocular lens: Secondary | ICD-10-CM | POA: Diagnosis not present

## 2023-02-15 DIAGNOSIS — H5213 Myopia, bilateral: Secondary | ICD-10-CM | POA: Diagnosis not present

## 2023-02-15 DIAGNOSIS — H26493 Other secondary cataract, bilateral: Secondary | ICD-10-CM | POA: Diagnosis not present

## 2023-02-15 DIAGNOSIS — H353113 Nonexudative age-related macular degeneration, right eye, advanced atrophic without subfoveal involvement: Secondary | ICD-10-CM | POA: Diagnosis not present

## 2023-04-01 DIAGNOSIS — H26491 Other secondary cataract, right eye: Secondary | ICD-10-CM | POA: Diagnosis not present

## 2023-07-14 ENCOUNTER — Other Ambulatory Visit: Payer: Self-pay | Admitting: Internal Medicine

## 2023-07-14 DIAGNOSIS — Z1231 Encounter for screening mammogram for malignant neoplasm of breast: Secondary | ICD-10-CM

## 2023-08-04 DIAGNOSIS — Z803 Family history of malignant neoplasm of breast: Secondary | ICD-10-CM | POA: Diagnosis not present

## 2023-08-04 DIAGNOSIS — Z1331 Encounter for screening for depression: Secondary | ICD-10-CM | POA: Diagnosis not present

## 2023-08-04 DIAGNOSIS — I10 Essential (primary) hypertension: Secondary | ICD-10-CM | POA: Diagnosis not present

## 2023-08-04 DIAGNOSIS — I359 Nonrheumatic aortic valve disorder, unspecified: Secondary | ICD-10-CM | POA: Diagnosis not present

## 2023-08-04 DIAGNOSIS — E559 Vitamin D deficiency, unspecified: Secondary | ICD-10-CM | POA: Diagnosis not present

## 2023-08-04 DIAGNOSIS — Z23 Encounter for immunization: Secondary | ICD-10-CM | POA: Diagnosis not present

## 2023-08-04 DIAGNOSIS — N1831 Chronic kidney disease, stage 3a: Secondary | ICD-10-CM | POA: Diagnosis not present

## 2023-08-04 DIAGNOSIS — I351 Nonrheumatic aortic (valve) insufficiency: Secondary | ICD-10-CM | POA: Diagnosis not present

## 2023-08-04 DIAGNOSIS — L820 Inflamed seborrheic keratosis: Secondary | ICD-10-CM | POA: Diagnosis not present

## 2023-08-04 DIAGNOSIS — Z0000 Encounter for general adult medical examination without abnormal findings: Secondary | ICD-10-CM | POA: Diagnosis not present

## 2023-08-16 ENCOUNTER — Ambulatory Visit
Admission: RE | Admit: 2023-08-16 | Discharge: 2023-08-16 | Disposition: A | Discharge status: Home / Self Care | Payer: Medicare PPO | Source: Ambulatory Visit | Attending: Internal Medicine | Admitting: Internal Medicine

## 2023-08-16 DIAGNOSIS — Z1231 Encounter for screening mammogram for malignant neoplasm of breast: Secondary | ICD-10-CM

## 2023-11-22 DIAGNOSIS — R399 Unspecified symptoms and signs involving the genitourinary system: Secondary | ICD-10-CM | POA: Diagnosis not present

## 2024-02-03 ENCOUNTER — Other Ambulatory Visit (HOSPITAL_COMMUNITY): Payer: Self-pay | Admitting: Internal Medicine

## 2024-02-03 DIAGNOSIS — R140 Abdominal distension (gaseous): Secondary | ICD-10-CM | POA: Diagnosis not present

## 2024-02-03 DIAGNOSIS — N390 Urinary tract infection, site not specified: Secondary | ICD-10-CM | POA: Diagnosis not present

## 2024-02-03 DIAGNOSIS — K56609 Unspecified intestinal obstruction, unspecified as to partial versus complete obstruction: Secondary | ICD-10-CM | POA: Diagnosis not present

## 2024-02-03 DIAGNOSIS — R112 Nausea with vomiting, unspecified: Secondary | ICD-10-CM

## 2024-02-03 DIAGNOSIS — C786 Secondary malignant neoplasm of retroperitoneum and peritoneum: Secondary | ICD-10-CM | POA: Diagnosis not present

## 2024-02-04 ENCOUNTER — Encounter (HOSPITAL_COMMUNITY): Payer: Self-pay | Admitting: Emergency Medicine

## 2024-02-04 ENCOUNTER — Ambulatory Visit (HOSPITAL_COMMUNITY)
Admission: RE | Admit: 2024-02-04 | Discharge: 2024-02-04 | Disposition: A | Discharge status: Home / Self Care | Payer: Medicare PPO | Source: Ambulatory Visit | Attending: Internal Medicine | Admitting: Internal Medicine

## 2024-02-04 ENCOUNTER — Inpatient Hospital Stay (HOSPITAL_COMMUNITY)
Admission: EM | Admit: 2024-02-04 | Discharge: 2024-03-01 | DRG: 987 | Disposition: A | Discharge status: Home Health | Payer: Medicare PPO | Attending: Internal Medicine | Admitting: Internal Medicine

## 2024-02-04 ENCOUNTER — Other Ambulatory Visit: Payer: Self-pay

## 2024-02-04 ENCOUNTER — Inpatient Hospital Stay (HOSPITAL_COMMUNITY): Payer: Medicare PPO

## 2024-02-04 DIAGNOSIS — Z7901 Long term (current) use of anticoagulants: Secondary | ICD-10-CM

## 2024-02-04 DIAGNOSIS — Z79899 Other long term (current) drug therapy: Secondary | ICD-10-CM | POA: Diagnosis not present

## 2024-02-04 DIAGNOSIS — K56609 Unspecified intestinal obstruction, unspecified as to partial versus complete obstruction: Secondary | ICD-10-CM | POA: Diagnosis not present

## 2024-02-04 DIAGNOSIS — R1900 Intra-abdominal and pelvic swelling, mass and lump, unspecified site: Principal | ICD-10-CM

## 2024-02-04 DIAGNOSIS — I428 Other cardiomyopathies: Secondary | ICD-10-CM | POA: Diagnosis not present

## 2024-02-04 DIAGNOSIS — Z6821 Body mass index (BMI) 21.0-21.9, adult: Secondary | ICD-10-CM

## 2024-02-04 DIAGNOSIS — J9811 Atelectasis: Secondary | ICD-10-CM | POA: Diagnosis not present

## 2024-02-04 DIAGNOSIS — N281 Cyst of kidney, acquired: Secondary | ICD-10-CM | POA: Diagnosis not present

## 2024-02-04 DIAGNOSIS — I700 Atherosclerosis of aorta: Secondary | ICD-10-CM | POA: Diagnosis present

## 2024-02-04 DIAGNOSIS — K1230 Oral mucositis (ulcerative), unspecified: Secondary | ICD-10-CM

## 2024-02-04 DIAGNOSIS — I82402 Acute embolism and thrombosis of unspecified deep veins of left lower extremity: Secondary | ICD-10-CM | POA: Diagnosis not present

## 2024-02-04 DIAGNOSIS — Z452 Encounter for adjustment and management of vascular access device: Secondary | ICD-10-CM | POA: Diagnosis not present

## 2024-02-04 DIAGNOSIS — C787 Secondary malignant neoplasm of liver and intrahepatic bile duct: Secondary | ICD-10-CM | POA: Diagnosis not present

## 2024-02-04 DIAGNOSIS — R180 Malignant ascites: Secondary | ICD-10-CM | POA: Diagnosis not present

## 2024-02-04 DIAGNOSIS — R590 Localized enlarged lymph nodes: Secondary | ICD-10-CM | POA: Diagnosis present

## 2024-02-04 DIAGNOSIS — B370 Candidal stomatitis: Secondary | ICD-10-CM | POA: Diagnosis present

## 2024-02-04 DIAGNOSIS — N858 Other specified noninflammatory disorders of uterus: Secondary | ICD-10-CM | POA: Diagnosis not present

## 2024-02-04 DIAGNOSIS — Z85831 Personal history of malignant neoplasm of soft tissue: Secondary | ICD-10-CM

## 2024-02-04 DIAGNOSIS — B3789 Other sites of candidiasis: Secondary | ICD-10-CM | POA: Diagnosis not present

## 2024-02-04 DIAGNOSIS — K561 Intussusception: Secondary | ICD-10-CM | POA: Diagnosis present

## 2024-02-04 DIAGNOSIS — Z888 Allergy status to other drugs, medicaments and biological substances status: Secondary | ICD-10-CM

## 2024-02-04 DIAGNOSIS — I10 Essential (primary) hypertension: Secondary | ICD-10-CM | POA: Diagnosis present

## 2024-02-04 DIAGNOSIS — N390 Urinary tract infection, site not specified: Secondary | ICD-10-CM | POA: Diagnosis present

## 2024-02-04 DIAGNOSIS — C772 Secondary and unspecified malignant neoplasm of intra-abdominal lymph nodes: Secondary | ICD-10-CM | POA: Diagnosis not present

## 2024-02-04 DIAGNOSIS — C7989 Secondary malignant neoplasm of other specified sites: Secondary | ICD-10-CM | POA: Diagnosis not present

## 2024-02-04 DIAGNOSIS — N179 Acute kidney failure, unspecified: Secondary | ICD-10-CM | POA: Diagnosis not present

## 2024-02-04 DIAGNOSIS — K219 Gastro-esophageal reflux disease without esophagitis: Secondary | ICD-10-CM | POA: Diagnosis not present

## 2024-02-04 DIAGNOSIS — E43 Unspecified severe protein-calorie malnutrition: Secondary | ICD-10-CM | POA: Diagnosis present

## 2024-02-04 DIAGNOSIS — J90 Pleural effusion, not elsewhere classified: Secondary | ICD-10-CM | POA: Diagnosis not present

## 2024-02-04 DIAGNOSIS — K5669 Other partial intestinal obstruction: Secondary | ICD-10-CM | POA: Diagnosis present

## 2024-02-04 DIAGNOSIS — R971 Elevated cancer antigen 125 [CA 125]: Secondary | ICD-10-CM | POA: Diagnosis not present

## 2024-02-04 DIAGNOSIS — C579 Malignant neoplasm of female genital organ, unspecified: Secondary | ICD-10-CM | POA: Diagnosis not present

## 2024-02-04 DIAGNOSIS — I82452 Acute embolism and thrombosis of left peroneal vein: Secondary | ICD-10-CM | POA: Diagnosis not present

## 2024-02-04 DIAGNOSIS — C801 Malignant (primary) neoplasm, unspecified: Secondary | ICD-10-CM | POA: Diagnosis not present

## 2024-02-04 DIAGNOSIS — C786 Secondary malignant neoplasm of retroperitoneum and peritoneum: Principal | ICD-10-CM

## 2024-02-04 DIAGNOSIS — I2609 Other pulmonary embolism with acute cor pulmonale: Secondary | ICD-10-CM | POA: Diagnosis not present

## 2024-02-04 DIAGNOSIS — R112 Nausea with vomiting, unspecified: Secondary | ICD-10-CM

## 2024-02-04 DIAGNOSIS — E440 Moderate protein-calorie malnutrition: Secondary | ICD-10-CM

## 2024-02-04 DIAGNOSIS — C569 Malignant neoplasm of unspecified ovary: Secondary | ICD-10-CM | POA: Diagnosis present

## 2024-02-04 DIAGNOSIS — R140 Abdominal distension (gaseous): Secondary | ICD-10-CM | POA: Insufficient documentation

## 2024-02-04 DIAGNOSIS — C482 Malignant neoplasm of peritoneum, unspecified: Secondary | ICD-10-CM | POA: Diagnosis present

## 2024-02-04 DIAGNOSIS — T380X5A Adverse effect of glucocorticoids and synthetic analogues, initial encounter: Secondary | ICD-10-CM | POA: Diagnosis not present

## 2024-02-04 DIAGNOSIS — R188 Other ascites: Secondary | ICD-10-CM | POA: Diagnosis not present

## 2024-02-04 DIAGNOSIS — I2699 Other pulmonary embolism without acute cor pulmonale: Secondary | ICD-10-CM | POA: Diagnosis present

## 2024-02-04 DIAGNOSIS — I129 Hypertensive chronic kidney disease with stage 1 through stage 4 chronic kidney disease, or unspecified chronic kidney disease: Secondary | ICD-10-CM | POA: Diagnosis present

## 2024-02-04 DIAGNOSIS — Z8543 Personal history of malignant neoplasm of ovary: Secondary | ICD-10-CM

## 2024-02-04 DIAGNOSIS — Z7189 Other specified counseling: Secondary | ICD-10-CM

## 2024-02-04 DIAGNOSIS — K2100 Gastro-esophageal reflux disease with esophagitis, without bleeding: Secondary | ICD-10-CM | POA: Diagnosis not present

## 2024-02-04 DIAGNOSIS — Z66 Do not resuscitate: Secondary | ICD-10-CM | POA: Diagnosis not present

## 2024-02-04 DIAGNOSIS — D630 Anemia in neoplastic disease: Secondary | ICD-10-CM | POA: Diagnosis present

## 2024-02-04 DIAGNOSIS — Z4682 Encounter for fitting and adjustment of non-vascular catheter: Secondary | ICD-10-CM | POA: Diagnosis not present

## 2024-02-04 DIAGNOSIS — R109 Unspecified abdominal pain: Secondary | ICD-10-CM | POA: Diagnosis not present

## 2024-02-04 DIAGNOSIS — R4589 Other symptoms and signs involving emotional state: Secondary | ICD-10-CM

## 2024-02-04 DIAGNOSIS — E871 Hypo-osmolality and hyponatremia: Secondary | ICD-10-CM | POA: Diagnosis not present

## 2024-02-04 DIAGNOSIS — Z800 Family history of malignant neoplasm of digestive organs: Secondary | ICD-10-CM

## 2024-02-04 DIAGNOSIS — D649 Anemia, unspecified: Secondary | ICD-10-CM

## 2024-02-04 DIAGNOSIS — I82492 Acute embolism and thrombosis of other specified deep vein of left lower extremity: Secondary | ICD-10-CM | POA: Diagnosis not present

## 2024-02-04 DIAGNOSIS — D509 Iron deficiency anemia, unspecified: Secondary | ICD-10-CM | POA: Diagnosis not present

## 2024-02-04 DIAGNOSIS — Z170 Estrogen receptor positive status [ER+]: Secondary | ICD-10-CM

## 2024-02-04 DIAGNOSIS — Z9221 Personal history of antineoplastic chemotherapy: Secondary | ICD-10-CM

## 2024-02-04 DIAGNOSIS — Z85828 Personal history of other malignant neoplasm of skin: Secondary | ICD-10-CM

## 2024-02-04 DIAGNOSIS — R12 Heartburn: Secondary | ICD-10-CM | POA: Diagnosis not present

## 2024-02-04 DIAGNOSIS — R1907 Generalized intra-abdominal and pelvic swelling, mass and lump: Secondary | ICD-10-CM | POA: Diagnosis present

## 2024-02-04 DIAGNOSIS — R110 Nausea: Secondary | ICD-10-CM | POA: Diagnosis not present

## 2024-02-04 DIAGNOSIS — N189 Chronic kidney disease, unspecified: Secondary | ICD-10-CM | POA: Diagnosis present

## 2024-02-04 DIAGNOSIS — Z515 Encounter for palliative care: Secondary | ICD-10-CM | POA: Diagnosis not present

## 2024-02-04 DIAGNOSIS — C779 Secondary and unspecified malignant neoplasm of lymph node, unspecified: Secondary | ICD-10-CM | POA: Diagnosis not present

## 2024-02-04 DIAGNOSIS — R9389 Abnormal findings on diagnostic imaging of other specified body structures: Secondary | ICD-10-CM | POA: Diagnosis not present

## 2024-02-04 DIAGNOSIS — C541 Malignant neoplasm of endometrium: Secondary | ICD-10-CM | POA: Diagnosis not present

## 2024-02-04 DIAGNOSIS — Z803 Family history of malignant neoplasm of breast: Secondary | ICD-10-CM

## 2024-02-04 DIAGNOSIS — Z853 Personal history of malignant neoplasm of breast: Secondary | ICD-10-CM

## 2024-02-04 DIAGNOSIS — Z86718 Personal history of other venous thrombosis and embolism: Secondary | ICD-10-CM

## 2024-02-04 DIAGNOSIS — Z86711 Personal history of pulmonary embolism: Secondary | ICD-10-CM | POA: Diagnosis not present

## 2024-02-04 DIAGNOSIS — C7960 Secondary malignant neoplasm of unspecified ovary: Secondary | ICD-10-CM | POA: Diagnosis not present

## 2024-02-04 LAB — COMPREHENSIVE METABOLIC PANEL
ALT: 23 U/L (ref 0–44)
AST: 22 U/L (ref 15–41)
Albumin: 3.6 g/dL (ref 3.5–5.0)
Alkaline Phosphatase: 89 U/L (ref 38–126)
Anion gap: 15 (ref 5–15)
BUN: 35 mg/dL — ABNORMAL HIGH (ref 8–23)
CO2: 24 mmol/L (ref 22–32)
Calcium: 10.1 mg/dL (ref 8.9–10.3)
Chloride: 98 mmol/L (ref 98–111)
Creatinine, Ser: 1.27 mg/dL — ABNORMAL HIGH (ref 0.44–1.00)
GFR, Estimated: 41 mL/min — ABNORMAL LOW (ref >60)
Glucose, Bld: 117 mg/dL — ABNORMAL HIGH (ref 70–99)
Potassium: 4.4 mmol/L (ref 3.5–5.1)
Sodium: 137 mmol/L (ref 135–145)
Total Bilirubin: 0.9 mg/dL (ref 0.0–1.2)
Total Protein: 7.3 g/dL (ref 6.5–8.1)

## 2024-02-04 LAB — ABO/RH: ABO/RH(D): B POS

## 2024-02-04 LAB — CBC
HCT: 39.4 % (ref 36.0–46.0)
Hemoglobin: 13.2 g/dL (ref 12.0–15.0)
MCH: 31.5 pg (ref 26.0–34.0)
MCHC: 33.5 g/dL (ref 30.0–36.0)
MCV: 94 fL (ref 80.0–100.0)
Platelets: 334 10*3/uL (ref 150–400)
RBC: 4.19 MIL/uL (ref 3.87–5.11)
RDW: 11.9 % (ref 11.5–15.5)
WBC: 9.8 10*3/uL (ref 4.0–10.5)
nRBC: 0 % (ref 0.0–0.2)

## 2024-02-04 LAB — LIPASE, BLOOD: Lipase: 29 U/L (ref 11–51)

## 2024-02-04 LAB — URINALYSIS, ROUTINE W REFLEX MICROSCOPIC
Bacteria, UA: NONE SEEN
Bilirubin Urine: NEGATIVE
Glucose, UA: NEGATIVE mg/dL
Ketones, ur: 20 mg/dL — AB
Leukocytes,Ua: NEGATIVE
Nitrite: NEGATIVE
Protein, ur: NEGATIVE mg/dL
Specific Gravity, Urine: >1.046 — ABNORMAL HIGH (ref 1.005–1.030)
pH: 5 (ref 5.0–8.0)

## 2024-02-04 LAB — CREATININE, SERUM
Creatinine, Ser: 1.34 mg/dL — ABNORMAL HIGH (ref 0.44–1.00)
GFR, Estimated: 39 mL/min — ABNORMAL LOW (ref >60)

## 2024-02-04 LAB — TYPE AND SCREEN
ABO/RH(D): B POS
Antibody Screen: NEGATIVE

## 2024-02-04 MED ORDER — MORPHINE SULFATE (PF) 2 MG/ML IV SOLN
2.0000 mg | INTRAVENOUS | Status: DC | PRN
  Filled 2024-02-04 (×2): qty 1

## 2024-02-04 MED ORDER — LOSARTAN POTASSIUM 25 MG PO TABS: 100.0000 mg | ORAL_TABLET | Freq: Every morning | ORAL | Status: DC

## 2024-02-04 MED ORDER — ENOXAPARIN SODIUM 40 MG/0.4ML IJ SOSY
40.0000 mg | PREFILLED_SYRINGE | INTRAMUSCULAR | Status: DC
  Administered 2024-02-05 – 2024-02-11 (×7): 40 mg via SUBCUTANEOUS
  Filled 2024-02-04 (×7): qty 0.4

## 2024-02-04 MED ORDER — ONDANSETRON HCL 4 MG/2ML IJ SOLN
4.0000 mg | Freq: Once | INTRAMUSCULAR | Status: AC
  Administered 2024-02-04: 4 mg via INTRAVENOUS
  Filled 2024-02-04: qty 2

## 2024-02-04 MED ORDER — ONDANSETRON HCL 4 MG/2ML IJ SOLN
4.0000 mg | Freq: Four times a day (QID) | INTRAMUSCULAR | Status: DC | PRN
  Administered 2024-02-04 – 2024-02-21 (×3): 4 mg via INTRAVENOUS
  Filled 2024-02-04 (×4): qty 2

## 2024-02-04 MED ORDER — IOHEXOL 300 MG/ML  SOLN
100.0000 mL | Freq: Once | INTRAMUSCULAR | Status: AC | PRN
  Administered 2024-02-04: 100 mL via INTRAVENOUS

## 2024-02-04 MED ORDER — LOSARTAN POTASSIUM 50 MG PO TABS
100.0000 mg | ORAL_TABLET | Freq: Every morning | ORAL | Status: DC
  Administered 2024-02-05 – 2024-02-19 (×15): 100 mg
  Filled 2024-02-04 (×5): qty 2
  Filled 2024-02-04: qty 4
  Filled 2024-02-04 (×10): qty 2

## 2024-02-04 MED ORDER — DEXTROSE IN LACTATED RINGERS 5 % IV SOLN: INTRAVENOUS | Status: AC

## 2024-02-04 MED ORDER — LACTATED RINGERS IV BOLUS
1000.0000 mL | Freq: Once | INTRAVENOUS | Status: AC
  Administered 2024-02-04: 1000 mL via INTRAVENOUS

## 2024-02-04 NOTE — ED Triage Notes (Signed)
 Pt reports abdominal discomfort, heartburn and nausea x 2 weeks. Had a scheduled CT scan from her physician who told her she has 2 masses that are suspicious for endometrial or cervical cancer but also had evidence of a bowel obstruction and needed to come to the ED  Pt reports no abdominal pain at this time just nausea.

## 2024-02-04 NOTE — Consult Note (Signed)
 Reason for Consult: Abdominal distention Referring Physician: Dr. Lenor Quant Tracy Preston is an 89 y.o. female.  HPI: The patient is a 89 year old white female who was in her normal state of health until about 2 weeks ago.  At that time she started experiencing more reflux type symptoms.  Over the last several days she has developed abdominal distention.  She has had several episodes of nausea and vomiting.  She denies any abdominal pain.  She is otherwise in good health and does not smoke.  She is also of the mother-in-law of Dr. Unice who was a neurosurgeon in town several years ago  Past Medical History:  Diagnosis Date   Cancer [facility])    skin cancer leg -no problems now   GERD (gastroesophageal reflux disease)    mild -no meds   Heart murmur    faint   Hypertension     Past Surgical History:  Procedure Laterality Date   BREAST BIOPSY  05/28/1984   benign-unknown which breast   COLONOSCOPY W/ POLYPECTOMY     COLONOSCOPY WITH PROPOFOL  N/A 08/06/2015   Procedure: COLONOSCOPY WITH PROPOFOL ;  Surgeon: Gladis MARLA Louder, MD;  Location: [facility];  Service: Endoscopy;  Laterality: N/A;   TONSILLECTOMY     child   TUBAL LIGATION      Family History  Problem Relation Age of Onset   Breast cancer Sister    Breast cancer Sister    Breast cancer Sister     Social History:  reports that she has never smoked. She does not have any smokeless tobacco history on file. She reports current alcohol use. She reports that she does not use drugs.  Allergies: No Known Allergies  Medications: I have reviewed the patient's current medications.  Results for orders placed or performed during the hospital encounter of 02/04/24 (from the past 48 hours)  Lipase, blood     Status: None   Collection Time: 02/04/24  4:19 PM  Result Value Ref Range   Lipase 29 11 - 51 U/L    Comment: Performed at [facility], [address], [place], [place] [address]  Comprehensive metabolic  panel     Status: Abnormal   Collection Time: 02/04/24  4:19 PM  Result Value Ref Range   Sodium 137 135 - 145 mmol/L   Potassium 4.4 3.5 - 5.1 mmol/L   Chloride 98 98 - 111 mmol/L   CO2 24 22 - 32 mmol/L   Glucose, Bld 117 (H) 70 - 99 mg/dL    Comment: Glucose reference range applies only to samples taken after fasting for at least 8 hours.   BUN 35 (H) 8 - 23 mg/dL   Creatinine, Ser 8.72 (H) 0.44 - 1.00 mg/dL   Calcium 89.8 8.9 - 89.6 mg/dL   Total Protein 7.3 6.5 - 8.1 g/dL   Albumin 3.6 3.5 - 5.0 g/dL   AST 22 15 - 41 U/L   ALT 23 0 - 44 U/L   Alkaline Phosphatase 89 38 - 126 U/L   Total Bilirubin 0.9 0.0 - 1.2 mg/dL   GFR, Estimated 41 (L) >60 mL/min    Comment: (NOTE) Calculated using the CKD-EPI Creatinine Equation (2021)    Anion gap 15 5 - 15    Comment: Performed at [facility], [address] [place], [place] [address]  CBC     Status: None   Collection Time: 02/04/24  4:19 PM  Result Value Ref Range   WBC 9.8  4.0 - 10.5 K/uL   RBC 4.19 3.87 - 5.11 MIL/uL   Hemoglobin 13.2 12.0 - 15.0 g/dL   HCT 60.5 63.9 - 53.9 %   MCV 94.0 80.0 - 100.0 fL   MCH 31.5 26.0 - 34.0 pg   MCHC 33.5 30.0 - 36.0 g/dL   RDW 88.0 88.4 - 84.4 %   Platelets 334 150 - 400 K/uL   nRBC 0.0 0.0 - 0.2 %    Comment: Performed at [facility], [address], [place], [place] [address]  Urinalysis, Routine w reflex microscopic -Urine, Clean Catch     Status: Abnormal   Collection Time: 02/04/24  4:19 PM  Result Value Ref Range   Color, Urine YELLOW YELLOW   APPearance CLEAR CLEAR   Specific Gravity, Urine >1.046 (H) 1.005 - 1.030   pH 5.0 5.0 - 8.0   Glucose, UA NEGATIVE NEGATIVE mg/dL   Hgb urine dipstick SMALL (A) NEGATIVE   Bilirubin Urine NEGATIVE NEGATIVE   Ketones, ur 20 (A) NEGATIVE mg/dL   Protein, ur NEGATIVE NEGATIVE mg/dL   Nitrite NEGATIVE NEGATIVE   Leukocytes,Ua NEGATIVE NEGATIVE   RBC / HPF 0-5 0 - 5 RBC/hpf   WBC, UA 0-5 0 - 5  WBC/hpf   Bacteria, UA NONE SEEN NONE SEEN   Squamous Epithelial / HPF 0-5 0 - 5 /HPF    Comment: Performed at [facility], [address] [place], [place] [address]  Type and screen [facility]     Status: None   Collection Time: 02/04/24  4:19 PM  Result Value Ref Range   ABO/RH(D) B POS    Antibody Screen NEG    Sample Expiration      02/07/2024,2359 Performed at [facility], [address], [place], [place] [address]     CT ABDOMEN PELVIS W CONTRAST Result Date: 02/04/2024 CLINICAL DATA:  Abdominal pain, bloating, nausea and vomiting for 2 weeks. * Tracking Code: BO * EXAM: CT ABDOMEN AND PELVIS WITH CONTRAST TECHNIQUE: Multidetector CT imaging of the abdomen and pelvis was performed using the standard protocol following bolus administration of intravenous contrast. RADIATION DOSE REDUCTION: This exam was performed according to the departmental dose-optimization program which includes automated exposure control, adjustment of the mA and/or kV according to patient size and/or use of iterative reconstruction technique. CONTRAST:  OMNIPAQUE  IOHEXOL  300 MG/ML  SOLN COMPARISON:  None Available. FINDINGS: Lower Chest: No acute findings. Hepatobiliary: A few tiny hepatic cysts are seen, however, there are multiple hypovascular masses involving the right and left hepatic lobes, largest in the central right hepatic lobe measuring 1.9 x 1.5 cm on image 26/2. These are consistent with liver metastases. Gallbladder is unremarkable. No evidence of biliary ductal dilatation. Pancreas:  No mass or inflammatory changes. Spleen: Within normal limits in size and appearance. Adrenals/Urinary Tract: No suspicious masses identified. No evidence of ureteral calculi or hydronephrosis. Stomach/Bowel: Distal small bowel obstruction, with transition point in the right lower quadrant, likely due to peritoneal carcinoma. Rectal intussusception incidentally  noted. Vascular/Lymphatic: Mild retroperitoneal lymphadenopathy in the left para-aortic and aortocaval spaces. No acute vascular findings. Reproductive: A 1.6 cm peripherally calcified fibroid is seen in the uterine fundus. Poorly defined masslike soft tissue prominence is seen in the region of the cervix and lower uterine segment measuring 3.6 x 3.3 cm on image 63/2, possibly representing a primary cervical or endometrial carcinoma. Poorly defined soft tissue density is seen in both adnexal regions and along  the uterine fundus, consistent with peritoneal carcinomatosis. Other: Mild ascites is seen. Peritoneal and omental soft tissue nodules and masses are seen throughout the abdomen and pelvis, consistent with peritoneal carcinomatosis. Largest conglomerate mass is seen in the right abdomen measuring approximately 6.7 x 3.4 cm on image 37/2. Musculoskeletal:  No suspicious bone lesions identified. IMPRESSION: Diffuse peritoneal carcinomatosis and mild ascites. Distal small bowel obstruction, with transition point in the right lower quadrant, likely due to peritoneal carcinomatosis. Masslike soft tissue prominence in the region of the lower uterine segment and cervix, possibly representing primary cervical or endometrial carcinoma. Liver metastases. Mild retroperitoneal lymphadenopathy, consistent with metastatic disease. Rectal intussusception incidentally noted. No definite lead mass identified. Electronically Signed   By: Norleen DELENA Kil M.D.   On: 02/04/2024 11:34    Review of Systems  Constitutional: Negative.   HENT: Negative.    Eyes: Negative.   Respiratory: Negative.    Cardiovascular: Negative.   Gastrointestinal:  Positive for abdominal distention, nausea and vomiting. Negative for abdominal pain.  Endocrine: Negative.   Genitourinary: Negative.   Musculoskeletal: Negative.   Skin: Negative.   Allergic/Immunologic: Negative.   Neurological: Negative.   Hematological: Negative.    Psychiatric/Behavioral: Negative.     Blood pressure 138/75, pulse 74, temperature 98 F (36.7 C), temperature source Oral, resp. rate 17, SpO2 96%. Physical Exam Vitals reviewed.  Constitutional:      General: She is not in acute distress.    Appearance: Normal appearance. She is well-developed.  HENT:     Head: Normocephalic and atraumatic.     Right Ear: External ear normal.     Left Ear: External ear normal.     Nose: Nose normal.     Mouth/Throat:     Mouth: Mucous membranes are dry.     Pharynx: Oropharynx is clear.  Eyes:     General: No scleral icterus.    Extraocular Movements: Extraocular movements intact.     Pupils: Pupils are equal, round, and reactive to light.  Cardiovascular:     Rate and Rhythm: Normal rate and regular rhythm.     Pulses: Normal pulses.     Heart sounds: Murmur heard.  Pulmonary:     Effort: Pulmonary effort is normal. No respiratory distress.     Breath sounds: Normal breath sounds.  Abdominal:     Comments: The abdomen is soft but quite distended with no guarding  Musculoskeletal:        General: No swelling or deformity. Normal range of motion.  Skin:    General: Skin is warm and dry.     Coloration: Skin is not jaundiced.  Neurological:     General: No focal deficit present.     Mental Status: She is alert and oriented to person, place, and time.  Psychiatric:        Mood and Affect: Mood normal.        Behavior: Behavior normal.     Assessment/Plan: The patient appears to have a small bowel obstruction most likely secondary to carcinomatosis.  The primary is unknown at this point.  At this point she will need admission to the medical service.  She will need an oncology consult.  She will need an NG tube placed for decompression.  It is quite possible she will come to need exploration over the next several days.  Her and her family understand that this is not a curable situation.  We will follow her closely and see what  recommendations oncology has.  Deward Null III 02/04/2024, 6:44 PM

## 2024-02-04 NOTE — H&P (Signed)
 History and Physical    Patient: Tracy Preston DOB: 05-08-1935 DOA: 02/04/2024 DOS: the patient was seen and examined on 02/04/2024 PCP: Delice Charleston, MD (Inactive)  Patient coming from: Home  Chief Complaint:  Chief Complaint  Patient presents with   Abdominal Pain   HPI: Tracy Preston is a 89 y.o. female with medical history significant of GERD, essential hypertension, previous history of skin cancer who has been having progressive abdominal pain with nausea over the last 2 weeks.  Patient also noted some constipation.  She went to see her primary care physician who scheduled CT abdomen pelvis that came back showing some masses.  Suspicion was for possible endometrial or cervical cancer.  There are also suspicion for possible small bowel obstruction.  Patient was asked to come to the ER for further evaluation.  In the ER patient appears to have peritoneal carcinomatosis with multiple abdominal masses.  She also has abdominal distention with evidence of small bowel obstruction.  Surgery was consulted and NG tube is started.  Patient is fully awake and able to give history.  Her daughter at her side says she also noticed some weight loss.  No melena, no hematemesis no bright red blood per rectum.  Patient is being admitted with small bowel obstruction secondary to intra-abdominal masses and peritoneal carcinomatosis.  Review of Systems: As mentioned in the history of present illness. All other systems reviewed and are negative. Past Medical History:  Diagnosis Date   Cancer (HCC)    skin cancer leg -no problems now   GERD (gastroesophageal reflux disease)    mild -no meds   Heart murmur    faint   Hypertension    Past Surgical History:  Procedure Laterality Date   BREAST BIOPSY  05/28/1984   benign-unknown which breast   COLONOSCOPY W/ POLYPECTOMY     COLONOSCOPY WITH PROPOFOL  N/A 08/06/2015   Procedure: COLONOSCOPY WITH PROPOFOL ;  Surgeon: Gladis MARLA Louder, MD;   Location: [facility];  Service: Endoscopy;  Laterality: N/A;   TONSILLECTOMY     child   TUBAL LIGATION     Social History:  reports that she has never smoked. She does not have any smokeless tobacco history on file. She reports current alcohol use. She reports that she does not use drugs.  No Known Allergies  Family History  Problem Relation Age of Onset   Breast cancer Sister    Breast cancer Sister    Breast cancer Sister     Prior to Admission medications   Medication Sig Start Date End Date Taking? Authorizing Provider  calcium-vitamin D (OSCAL WITH D) 500-200 MG-UNIT per tablet Take 1 tablet by mouth daily.    [provider]  cholecalciferol (VITAMIN D) 1000 UNITS tablet Take 1,000 Units by mouth daily.    [provider]  losartan  (COZAAR ) 100 MG tablet Take 100 mg by mouth every morning.    [provider]    Physical Exam: Vitals:   02/04/24 1559 02/04/24 1710 02/04/24 1947  BP: (!) 171/106 138/75   Pulse: (!) 102 74   Resp: 16 17   Temp: 98 F (36.7 C)  98 F (36.7 C)  TempSrc: Oral    SpO2: 98% 96%    Constitutional: Chronically ill looking, NG tube in place, pleasant, NAD, calm, comfortable Eyes: PERRL, lids and conjunctivae normal ENMT: Mucous membranes are moist. Posterior pharynx clear of any exudate or lesions.Normal dentition.  Neck: normal, supple, no masses, no thyromegaly Respiratory: clear  to auscultation bilaterally, no wheezing, no crackles. Normal respiratory effort. No accessory muscle use.  Cardiovascular: Sinus tachycardia, no murmurs / rubs / gallops. No extremity edema. 2+ pedal pulses. No carotid bruits.  Abdomen: Distended abdomen, nontender, some palpable intra-abdominal masses.  No hepatosplenomegaly. Bowel sounds positive.  Musculoskeletal: Good range of motion, no joint swelling or tenderness, Skin: no rashes, lesions, ulcers. No induration Neurologic: CN 2-12 grossly intact. Sensation intact, DTR normal.  Strength 5/5 in all 4.  Psychiatric: Normal judgment and insight. Alert and oriented x 3. Normal mood  Data Reviewed:  Blood pressure is 171/106, pulse 102, BUN is 35 creatinine 1.27, no recent labs to compare.  Urinalysis essentially negative.  CT abdomen pelvis showed diffuse peritoneal carcinomatosis with mild ascites.  There was distal small bowel obstruction with transition point in the right lower quadrant.  This is likely due to peritoneal carcinomatosis.  Patient also has masslike soft tissue prominence in the region of the lower uterine segment and cervix possibly representing primary cervical or endometrial carcinoma.  There is evidence of liver metastasis with mild retroperitoneal lymphadenopathy consistent with disease as well as rectal intussusception is incidentally noted  Assessment and Plan:  #1 small bowel obstruction: Most likely as a result of peritoneal carcinomatosis.  Patient will be admitted.  Surgery already consulted.  Will need some tissue probably IR or surgery will await surgical recommendations.  Patient will ultimately need oncology consult.  NG tube is started.  IV fluid.  Pain control.  Nausea control.  Patient wants to have ice chips we will allow ice chips.  Patient will be n.p.o.  #2 peritoneal carcinomatosis: Most likely from her GYN related cancer.  Patient will needs diagnosis.  She has very little ascites otherwise paracentesis may be the best way to get it.  Surgery involved.  We may need IR for fine-needle biopsy.  I will defer to surgery for their recommendations.  #3 essential hypertension: Patient will be NPO.  If blood pressure continues to be markedly elevated we may have to consider IV medications.  #4 AKI: Most likely prerenal.  Will hydrate aggressively and monitor renal function.  #5 GERD: Resume PPIs when ready.  May need IV Protonix .    Advance Care Planning:   Code Status: Full Code   Consults: Dr. Curvin, general surgery  Family  Communication: Daughter in the room with the patient  Severity of Illness: The appropriate patient status for this patient is INPATIENT. Inpatient status is judged to be reasonable and necessary in order to provide the required intensity of service to ensure the patient's safety. The patient's presenting symptoms, physical exam findings, and initial radiographic and laboratory data in the context of their chronic comorbidities is felt to place them at high risk for further clinical deterioration. Furthermore, it is not anticipated that the patient will be medically stable for discharge from the hospital within 2 midnights of admission.   * I certify that at the point of admission it is my clinical judgment that the patient will require inpatient hospital care spanning beyond 2 midnights from the point of admission due to high intensity of service, high risk for further deterioration and high frequency of surveillance required.*  AuthorBETHA SIM KNOLL, MD 02/04/2024 9:13 PM  For on call review [email or web address].

## 2024-02-04 NOTE — ED Provider Notes (Signed)
  EMERGENCY DEPARTMENT AT [facility] Provider Note   CSN: [id number] Arrival date & time: 02/04/24  1553     History  Chief Complaint  Patient presents with   Abdominal Pain    Tracy Preston is a 89 y.o. female.  Patient is a 89 year old female with a history of hypertension, chronic kidney disease, family history of breast cancer who presents with abdominal distention and vomiting.  She reports abdominal distention for the last 2 weeks.  Over the last 2 days she has had some intermittent nausea and vomiting.  This morning she was able to keep down some tea and crackers but has ongoing nausea.  She says she has had normal bowel movements.  She has lost a couple pounds in the last several days due to not eating but otherwise does not report any significant weight loss.  No urinary symptoms.  She saw her PCP earlier this week.  Her urine was concerning for infection and she was started on Macrobid .  She is only taken 1 days worth.  She had a CT scan today which showed some concerning findings and she was sent here for further evaluation.       Home Medications Prior to Admission medications   Medication Sig Start Date End Date Taking? Authorizing Provider  calcium-vitamin D (OSCAL WITH D) 500-200 MG-UNIT per tablet Take 1 tablet by mouth daily.    [provider]  cholecalciferol (VITAMIN D) 1000 UNITS tablet Take 1,000 Units by mouth daily.    [provider]  losartan  (COZAAR ) 100 MG tablet Take 100 mg by mouth every morning.    [provider]      Allergies    Patient has no known allergies.    Review of Systems   Review of Systems  Constitutional:  Negative for chills, diaphoresis, fatigue and fever.  HENT:  Negative for congestion, rhinorrhea and sneezing.   Eyes: Negative.   Respiratory:  Negative for cough, chest tightness and shortness of breath.   Cardiovascular:  Negative for chest pain and leg swelling.   Gastrointestinal:  Positive for abdominal distention, nausea and vomiting. Negative for abdominal pain, blood in stool and diarrhea.  Genitourinary:  Negative for difficulty urinating, flank pain, frequency and hematuria.  Musculoskeletal:  Negative for arthralgias and back pain.  Skin:  Negative for rash.  Neurological:  Negative for dizziness, speech difficulty, weakness, numbness and headaches.    Physical Exam Updated Vital Signs BP 138/75   Pulse 74   Temp 98 F (36.7 C) (Oral)   Resp 17   SpO2 96%  Physical Exam Constitutional:      Appearance: She is well-developed.  HENT:     Head: Normocephalic and atraumatic.  Eyes:     Pupils: Pupils are equal, round, and reactive to light.  Cardiovascular:     Rate and Rhythm: Normal rate and regular rhythm.     Heart sounds: Normal heart sounds.  Pulmonary:     Effort: Pulmonary effort is normal. No respiratory distress.     Breath sounds: Normal breath sounds. No wheezing or rales.  Chest:     Chest wall: No tenderness.  Abdominal:     General: Bowel sounds are normal. There is distension.     Palpations: Abdomen is soft.     Tenderness: There is no abdominal tenderness. There is no guarding or rebound.     Comments: Tympanic to percussion  Musculoskeletal:        General:  Normal range of motion.     Cervical back: Normal range of motion and neck supple.  Lymphadenopathy:     Cervical: No cervical adenopathy.  Skin:    General: Skin is warm and dry.     Findings: No rash.  Neurological:     Mental Status: She is alert and oriented to person, place, and time.     ED Results / Procedures / Treatments   Labs (all labs ordered are listed, but only abnormal results are displayed) Labs Reviewed  COMPREHENSIVE METABOLIC PANEL - Abnormal; Notable for the following components:      Result Value   Glucose, Bld 117 (*)    BUN 35 (*)    Creatinine, Ser 1.27 (*)    GFR, Estimated 41 (*)    All other components within  normal limits  URINALYSIS, ROUTINE W REFLEX MICROSCOPIC - Abnormal; Notable for the following components:   Specific Gravity, Urine >1.046 (*)    Hgb urine dipstick SMALL (*)    Ketones, ur 20 (*)    All other components within normal limits  LIPASE, BLOOD  CBC  TYPE AND SCREEN  ABO/RH    EKG None  Radiology CT ABDOMEN PELVIS W CONTRAST Result Date: 02/04/2024 CLINICAL DATA:  Abdominal pain, bloating, nausea and vomiting for 2 weeks. * Tracking Code: BO * EXAM: CT ABDOMEN AND PELVIS WITH CONTRAST TECHNIQUE: Multidetector CT imaging of the abdomen and pelvis was performed using the standard protocol following bolus administration of intravenous contrast. RADIATION DOSE REDUCTION: This exam was performed according to the departmental dose-optimization program which includes automated exposure control, adjustment of the mA and/or kV according to patient size and/or use of iterative reconstruction technique. CONTRAST:  OMNIPAQUE  IOHEXOL  300 MG/ML  SOLN COMPARISON:  None Available. FINDINGS: Lower Chest: No acute findings. Hepatobiliary: A few tiny hepatic cysts are seen, however, there are multiple hypovascular masses involving the right and left hepatic lobes, largest in the central right hepatic lobe measuring 1.9 x 1.5 cm on image 26/2. These are consistent with liver metastases. Gallbladder is unremarkable. No evidence of biliary ductal dilatation. Pancreas:  No mass or inflammatory changes. Spleen: Within normal limits in size and appearance. Adrenals/Urinary Tract: No suspicious masses identified. No evidence of ureteral calculi or hydronephrosis. Stomach/Bowel: Distal small bowel obstruction, with transition point in the right lower quadrant, likely due to peritoneal carcinoma. Rectal intussusception incidentally noted. Vascular/Lymphatic: Mild retroperitoneal lymphadenopathy in the left para-aortic and aortocaval spaces. No acute vascular findings. Reproductive: A 1.6 cm peripherally  calcified fibroid is seen in the uterine fundus. Poorly defined masslike soft tissue prominence is seen in the region of the cervix and lower uterine segment measuring 3.6 x 3.3 cm on image 63/2, possibly representing a primary cervical or endometrial carcinoma. Poorly defined soft tissue density is seen in both adnexal regions and along the uterine fundus, consistent with peritoneal carcinomatosis. Other: Mild ascites is seen. Peritoneal and omental soft tissue nodules and masses are seen throughout the abdomen and pelvis, consistent with peritoneal carcinomatosis. Largest conglomerate mass is seen in the right abdomen measuring approximately 6.7 x 3.4 cm on image 37/2. Musculoskeletal:  No suspicious bone lesions identified. IMPRESSION: Diffuse peritoneal carcinomatosis and mild ascites. Distal small bowel obstruction, with transition point in the right lower quadrant, likely due to peritoneal carcinomatosis. Masslike soft tissue prominence in the region of the lower uterine segment and cervix, possibly representing primary cervical or endometrial carcinoma. Liver metastases. Mild retroperitoneal lymphadenopathy, consistent with metastatic disease. Rectal intussusception  incidentally noted. No definite lead mass identified. Electronically Signed   By: Norleen DELENA Kil M.D.   On: 02/04/2024 11:34    Procedures Procedures    Medications Ordered in ED Medications  lactated ringers  bolus 1,000 mL (1,000 mLs Intravenous New Bag/Given 02/04/24 1656)  ondansetron  (ZOFRAN ) injection 4 mg (4 mg Intravenous Given 02/04/24 1649)    ED Course/ Medical Decision Making/ A&P                                 Medical Decision Making Amount and/or Complexity of Data Reviewed Labs: ordered.  Risk Prescription drug management. Decision regarding hospitalization.   Patient is a 89 year old female who presents with abdominal distention associate with some recent nausea and vomiting.  She had a CT scan done as an  outpatient.  I reviewed this and she has concerning soft tissue mass in her lower pelvic area as well as peritoneal carcinomatosis.  She has an associated small bowel obstruction and a rectal intussusception.  Her labs reviewed.  Her creatinine is mildly elevated but I cannot find any recent values to compare to it based on chart review.  I did discuss the patient with Dr. Curvin with general surgery.  He recommends a NG tube which was ordered.  He recommends admission to the hospital service.  I spoke with Dr. Sim who will admit the patient for further treatment.  Final Clinical Impression(s) / ED Diagnoses Final diagnoses:  Abdominal mass, unspecified abdominal location  SBO (small bowel obstruction) [facility])    Rx / DC Orders ED Discharge Orders     None         Lenor Hollering, MD 02/04/24 1942

## 2024-02-05 ENCOUNTER — Inpatient Hospital Stay (HOSPITAL_COMMUNITY): Payer: Medicare PPO

## 2024-02-05 DIAGNOSIS — K56609 Unspecified intestinal obstruction, unspecified as to partial versus complete obstruction: Secondary | ICD-10-CM | POA: Diagnosis not present

## 2024-02-05 DIAGNOSIS — R188 Other ascites: Secondary | ICD-10-CM | POA: Diagnosis not present

## 2024-02-05 DIAGNOSIS — C786 Secondary malignant neoplasm of retroperitoneum and peritoneum: Principal | ICD-10-CM

## 2024-02-05 DIAGNOSIS — I10 Essential (primary) hypertension: Secondary | ICD-10-CM

## 2024-02-05 DIAGNOSIS — K219 Gastro-esophageal reflux disease without esophagitis: Secondary | ICD-10-CM | POA: Diagnosis not present

## 2024-02-05 DIAGNOSIS — Z4682 Encounter for fitting and adjustment of non-vascular catheter: Secondary | ICD-10-CM | POA: Diagnosis not present

## 2024-02-05 LAB — CBC
HCT: 36.1 % (ref 36.0–46.0)
HCT: 33.3 % — ABNORMAL LOW (ref 36.0–46.0)
Hemoglobin: 12 g/dL (ref 12.0–15.0)
Hemoglobin: 10.7 g/dL — ABNORMAL LOW (ref 12.0–15.0)
MCH: 31.5 pg (ref 26.0–34.0)
MCH: 30.7 pg (ref 26.0–34.0)
MCHC: 33.2 g/dL (ref 30.0–36.0)
MCHC: 32.1 g/dL (ref 30.0–36.0)
MCV: 94.8 fL (ref 80.0–100.0)
MCV: 95.4 fL (ref 80.0–100.0)
Platelets: 290 10*3/uL (ref 150–400)
Platelets: 286 10*3/uL (ref 150–400)
RBC: 3.81 MIL/uL — ABNORMAL LOW (ref 3.87–5.11)
RBC: 3.49 MIL/uL — ABNORMAL LOW (ref 3.87–5.11)
RDW: 11.9 % (ref 11.5–15.5)
RDW: 12 % (ref 11.5–15.5)
WBC: 8.7 10*3/uL (ref 4.0–10.5)
WBC: 8.9 10*3/uL (ref 4.0–10.5)
nRBC: 0 % (ref 0.0–0.2)
nRBC: 0 % (ref 0.0–0.2)

## 2024-02-05 LAB — COMPREHENSIVE METABOLIC PANEL
ALT: 19 U/L (ref 0–44)
AST: 19 U/L (ref 15–41)
Albumin: 3.2 g/dL — ABNORMAL LOW (ref 3.5–5.0)
Alkaline Phosphatase: 82 U/L (ref 38–126)
Anion gap: 14 (ref 5–15)
BUN: 33 mg/dL — ABNORMAL HIGH (ref 8–23)
CO2: 24 mmol/L (ref 22–32)
Calcium: 9.8 mg/dL (ref 8.9–10.3)
Chloride: 98 mmol/L (ref 98–111)
Creatinine, Ser: 1.36 mg/dL — ABNORMAL HIGH (ref 0.44–1.00)
GFR, Estimated: 38 mL/min — ABNORMAL LOW (ref >60)
Glucose, Bld: 125 mg/dL — ABNORMAL HIGH (ref 70–99)
Potassium: 4 mmol/L (ref 3.5–5.1)
Sodium: 136 mmol/L (ref 135–145)
Total Bilirubin: 1.1 mg/dL (ref 0.0–1.2)
Total Protein: 6.7 g/dL (ref 6.5–8.1)

## 2024-02-05 LAB — BODY FLUID CELL COUNT WITH DIFFERENTIAL
Eos, Fluid: 0 %
Lymphs, Fluid: 46 %
Monocyte-Macrophage-Serous Fluid: 9 % — ABNORMAL LOW (ref 50–90)
Neutrophil Count, Fluid: 45 % — ABNORMAL HIGH (ref 0–25)
Total Nucleated Cell Count, Fluid: 2854 uL — ABNORMAL HIGH (ref 0–1000)

## 2024-02-05 MED ORDER — PANTOPRAZOLE SODIUM 40 MG IV SOLR
40.0000 mg | Freq: Two times a day (BID) | INTRAVENOUS | Status: DC
  Administered 2024-02-05 – 2024-02-26 (×41): 40 mg via INTRAVENOUS
  Filled 2024-02-05 (×41): qty 10

## 2024-02-05 MED ORDER — LIDOCAINE HCL 1 % IJ SOLN
INTRAMUSCULAR | Status: AC
  Filled 2024-02-05: qty 20

## 2024-02-05 MED ORDER — HYDRALAZINE HCL 20 MG/ML IJ SOLN: 10.0000 mg | Freq: Four times a day (QID) | INTRAMUSCULAR | Status: DC | PRN

## 2024-02-05 NOTE — Procedures (Signed)
 PROCEDURE SUMMARY:  Successful US  guided paracentesis from left lateral abdomen.  Yielded 100 mL of yellow fluid.  No immediate complications.  Pt tolerated well.   Specimen was sent for labs.  EBL < 5mL  Solmon Selmer Ku PA-C 02/05/2024 2:04 PM

## 2024-02-05 NOTE — Assessment & Plan Note (Signed)
 Currently on intravenous Protonix  as noted above

## 2024-02-05 NOTE — Progress Notes (Signed)
 Subjective/Chief Complaint: She feels much better this am   Objective: Vital signs in last 24 hours: Temp:  [98 F (36.7 C)-98.5 F (36.9 C)] 98.5 F (36.9 C) (02/08 0617) Pulse Rate:  [69-102] 73 (02/08 0700) Resp:  [16-19] 18 (02/08 0617) BP: (109-171)/(54-106) 134/70 (02/08 0700) SpO2:  [91 %-98 %] 93 % (02/08 0700)    Intake/Output from previous day: No intake/output data recorded. Intake/Output this shift: No intake/output data recorded.  General appearance: alert and cooperative Resp: clear to auscultation bilaterally Cardio: regular rate and rhythm GI: soft, less distended. nontender  Lab Results:  Recent Labs    02/04/24 1619 02/05/24 0003  WBC 9.8 8.7  HGB 13.2 12.0  HCT 39.4 36.1  PLT 334 290   BMET Recent Labs    02/04/24 1619 02/04/24 2316 02/05/24 0003  NA 137  --  136  K 4.4  --  4.0  CL 98  --  98  CO2 24  --  24  GLUCOSE 117*  --  125*  BUN 35*  --  33*  CREATININE 1.27* 1.34* 1.36*  CALCIUM 10.1  --  9.8   PT/INR No results for input(s): LABPROT, INR in the last 72 hours. ABG No results for input(s): PHART, HCO3 in the last 72 hours.  Invalid input(s): PCO2, PO2  Studies/Results: DG Chest Portable 1 View Result Date: 02/05/2024 CLINICAL DATA:  NG tube replacement confirmation EXAM: PORTABLE CHEST - 1 VIEW COMPARISON:  02/04/2024 FINDINGS: Cardiomediastinal silhouette and pulmonary vasculature are within normal limits. Lungs are clear. Nasogastric tube extends to the left upper quadrant the side hole located at the level of the gastroesophageal junction. It is slightly retracted compared to prior exam. IMPRESSION: Nasogastric tube terminates in the left upper quadrant in the expected site of the stomach. The side hole of the NG tube is located at the level of the gastroesophageal junction. Electronically Signed   By: Aliene Lloyd M.D.   On: 02/05/2024 07:38   DG Chest Portable 1 View Result Date: 02/04/2024 CLINICAL DATA:   NG tube placement. Abdominal discomfort, heartburn, and nausea. EXAM: PORTABLE CHEST 1 VIEW COMPARISON:  None Available. FINDINGS: The heart size and mediastinal contours are within normal limits. There is atherosclerotic calcification of the aorta. No consolidation, effusion, or pneumothorax. An enteric tube terminates in the stomach. No acute osseous abnormality. IMPRESSION: 1. No active disease. 2. Enteric tube terminates in the stomach. Electronically Signed   By: Leita Birmingham M.D.   On: 02/04/2024 21:20   CT ABDOMEN PELVIS W CONTRAST Result Date: 02/04/2024 CLINICAL DATA:  Abdominal pain, bloating, nausea and vomiting for 2 weeks. * Tracking Code: BO * EXAM: CT ABDOMEN AND PELVIS WITH CONTRAST TECHNIQUE: Multidetector CT imaging of the abdomen and pelvis was performed using the standard protocol following bolus administration of intravenous contrast. RADIATION DOSE REDUCTION: This exam was performed according to the departmental dose-optimization program which includes automated exposure control, adjustment of the mA and/or kV according to patient size and/or use of iterative reconstruction technique. CONTRAST:  OMNIPAQUE  IOHEXOL  300 MG/ML  SOLN COMPARISON:  None Available. FINDINGS: Lower Chest: No acute findings. Hepatobiliary: A few tiny hepatic cysts are seen, however, there are multiple hypovascular masses involving the right and left hepatic lobes, largest in the central right hepatic lobe measuring 1.9 x 1.5 cm on image 26/2. These are consistent with liver metastases. Gallbladder is unremarkable. No evidence of biliary ductal dilatation. Pancreas:  No mass or inflammatory changes. Spleen: Within normal limits  in size and appearance. Adrenals/Urinary Tract: No suspicious masses identified. No evidence of ureteral calculi or hydronephrosis. Stomach/Bowel: Distal small bowel obstruction, with transition point in the right lower quadrant, likely due to peritoneal carcinoma. Rectal intussusception  incidentally noted. Vascular/Lymphatic: Mild retroperitoneal lymphadenopathy in the left para-aortic and aortocaval spaces. No acute vascular findings. Reproductive: A 1.6 cm peripherally calcified fibroid is seen in the uterine fundus. Poorly defined masslike soft tissue prominence is seen in the region of the cervix and lower uterine segment measuring 3.6 x 3.3 cm on image 63/2, possibly representing a primary cervical or endometrial carcinoma. Poorly defined soft tissue density is seen in both adnexal regions and along the uterine fundus, consistent with peritoneal carcinomatosis. Other: Mild ascites is seen. Peritoneal and omental soft tissue nodules and masses are seen throughout the abdomen and pelvis, consistent with peritoneal carcinomatosis. Largest conglomerate mass is seen in the right abdomen measuring approximately 6.7 x 3.4 cm on image 37/2. Musculoskeletal:  No suspicious bone lesions identified. IMPRESSION: Diffuse peritoneal carcinomatosis and mild ascites. Distal small bowel obstruction, with transition point in the right lower quadrant, likely due to peritoneal carcinomatosis. Masslike soft tissue prominence in the region of the lower uterine segment and cervix, possibly representing primary cervical or endometrial carcinoma. Liver metastases. Mild retroperitoneal lymphadenopathy, consistent with metastatic disease. Rectal intussusception incidentally noted. No definite lead mass identified. Electronically Signed   By: Norleen DELENA Kil M.D.   On: 02/04/2024 11:34    Anti-infectives: Anti-infectives (From admission, onward)    None       Assessment/Plan: s/p * No surgery found * Continue ng and bowel rest She will need to be admitted to medicine service She needs oncology consult and may need gyn oncology to see as well Improving from bowel obstruction standpoint Will follow  LOS: 1 day    Tracy Preston 02/05/2024

## 2024-02-05 NOTE — Assessment & Plan Note (Addendum)
 NG tube at low intermittent suction Keeping n.p.o. managing conservatively for now. Intravenous Protonix  for evidence of small amounts of coffee-ground NG suction Dr. Curvin with General Surgery following, their input is appreciated As needed opiate-based analgesics for associated pain.

## 2024-02-05 NOTE — Assessment & Plan Note (Signed)
 Holding home regimen of losartan  Normotensive As needed intravenous angiotensins for markedly elevated blood pressure.

## 2024-02-05 NOTE — Assessment & Plan Note (Signed)
 CT imaging of the abdomen and pelvis on 2/7 revealing diffuse peritoneal carcinomatosis, masslike soft tissue prominence in the region of the lower ureter and segment and cervix, mild retroperitoneal lymphadenopathy and liver metastases Concern for gynecological malignancy as the source Case discussed with Dr. Rogelio with gynecology oncology who recommended IR consultation for obtaining paracentesis with cytology as well as evaluation for potential biopsy of one of the identified lesions. IR successfully obtained 100 cc of fluid via paracentesis, cytology and cell count obtained.

## 2024-02-05 NOTE — Progress Notes (Signed)
 Interventional Radiology Brief Note:  Patient presents to [facility] ED with abdominal distention, nausea found to have bowel obstruction vs. Ileus and ascites with concern for intra-abdominal malignancy.  IR consulted for paracentesis as well as possible biopsy.  Paracentesis complete with 100 mL removed.  Discussed biopsy with Dr. Karalee who notes the large mass exuding from the cervix by CT imaging.  Recommend biopsy of this mass if possible by Gyn/Onc or other performing service.  If unable to achieve diagnosis with cervical biopsy and diagnostic paracentesis, could consider liver lesion biopsy noting these masses are located very centrally.  Discussed with patient at bedside who is agreeable to next  steps as directed by medical team.   Bryant Lipps, MS RD PA-C

## 2024-02-05 NOTE — ED Notes (Signed)
 ED tech went in to help pt use bedside commode and pt began sneezing. After pt sneezed the NG tube came out. This RN replaced NG tube with a 14 fr.

## 2024-02-05 NOTE — Hospital Course (Addendum)
 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   89 year old female with history of hypertension, breast cancer who presented with abdominal distention, vomiting for 2 weeks.    CT abdomen was concerning for new metastatic disease from possible GYN origin, SBO.  Further workup showed peritoneal carcinomatosis with likely GYN malignancy.   GYN oncology, general surgery consulted, recommended pelvic MRI which showed advanced GYN malignancy.   CT chest with contrast showed distal pulmonary embolism present with cor pulmonale, started on heparin  drip.   Received first cycle of chemo on 2/15.    Hospital course remarkable for persistent bowel obstruction, started on TPN, Decadron .  Palliative care following.  Now having bowel movements.  Small bowel SBO protocol shows nondilated colon with contrast on entire colon. NG tube removed on 2/22, started on clears.  After that she developed intermittent nausea, andominal distention. Consulted  IR for exploring option of venting PEG tube

## 2024-02-05 NOTE — ED Notes (Addendum)
 Assumed care of pt. Pt is resting comfortably in bed. NG tube placement confirmed and placed back on low intermittent suction. Admitted and waiting for bed placement. D5LR maintenance running.

## 2024-02-05 NOTE — Progress Notes (Addendum)
 PROGRESS NOTE   Tracy Preston  FMW:[id number] DOB: 11/01/1935 DOA: 02/04/2024 PCP: Delice Charleston, MD (Inactive)   Date of Service: the patient was seen and examined on 02/05/2024  Brief Narrative:  89 year old female with a history of hypertension, chronic kidney disease, family history of breast cancer who presents with abdominal distention and vomiting for approximately 2 weeks presenting to [facility] emergency department by her outpatient provider after CT scan of the abdomen is performed and is found to have findings concerning for new metastatic gynecological malignancy.  Upon evaluation in the emergency department, the hospitalist group was asked to assess the patient for admission the hospital.  General surgery was additionally consulted and recommended gynecology/oncology evaluation.   Assessment & Plan SBO (small bowel obstruction) (HCC) NG tube at low intermittent suction Keeping n.p.o. managing conservatively for now. Intravenous Protonix  for evidence of small amounts of coffee-ground NG suction Dr. Curvin with General Surgery following, their input is appreciated As needed opiate-based analgesics for associated pain. Peritoneal carcinomatosis (HCC) CT imaging of the abdomen and pelvis on 2/7 revealing diffuse peritoneal carcinomatosis, masslike soft tissue prominence in the region of the lower ureter and segment and cervix, mild retroperitoneal lymphadenopathy and liver metastases Concern for gynecological malignancy as the source Case discussed with Dr. Rogelio with gynecology oncology who recommended IR consultation for obtaining paracentesis with cytology as well as evaluation for potential biopsy of one of the identified lesions. IR successfully obtained 100 cc of fluid via paracentesis, cytology and cell count obtained. Essential hypertension Holding home regimen of losartan  Normotensive As needed intravenous angiotensins for markedly elevated blood  pressure. GERD (gastroesophageal reflux disease) Currently on intravenous Protonix  as noted above    Subjective:  Patient complaining of mild abdominal pain currently, stating that her abdominal pain has dramatically improved since yesterday.  Physical Exam:  Vitals:   02/05/24 0900 02/05/24 0930 02/05/24 1000 02/05/24 1002  BP: 129/68 121/67 122/64   Pulse: 72 71 70   Resp: 18 18 18    Temp:    98.4 F (36.9 C)  TempSrc:    Oral  SpO2: 93% 91% 91%     Constitutional: Awake alert and oriented x3, no associated distress.   Skin: no rashes, no lesions, poor skin turgor noted. Eyes: Pupils are equally reactive to light.  No evidence of scleral icterus or conjunctival pallor.  ENMT: NG tube in place, dry mucous membranes noted.  Posterior pharynx clear of any exudate or lesions.   Respiratory: clear to auscultation bilaterally, no wheezing, no crackles. Normal respiratory effort. No accessory muscle use.  Cardiovascular: Regular rate and rhythm, no murmurs / rubs / gallops. No extremity edema. 2+ pedal pulses. No carotid bruits.  Abdomen: Abdomen is soft and nontender.  No evidence of intra-abdominal masses.  Positive bowel sounds noted in all quadrants.   Musculoskeletal: No joint deformity upper and lower extremities. Good ROM, no contractures. Normal muscle tone.    Data Reviewed:  I have personally reviewed and interpreted labs, imaging.  Significant findings are   CBC: Recent Labs  Lab 02/04/24 1619 02/05/24 0003  WBC 9.8 8.7  HGB 13.2 12.0  HCT 39.4 36.1  MCV 94.0 94.8  PLT 334 290   Basic Metabolic Panel: Recent Labs  Lab 02/04/24 1619 02/04/24 2316 02/05/24 0003  NA 137  --  136  K 4.4  --  4.0  CL 98  --  98  CO2 24  --  24  GLUCOSE 117*  --  125*  BUN 35*  --  33*  CREATININE 1.27* 1.34* 1.36*  CALCIUM 10.1  --  9.8   GFR: CrCl cannot be calculated (Unknown ideal weight.). Liver Function Tests: Recent Labs  Lab 02/04/24 1619 02/05/24 0003   AST 22 19  ALT 23 19  ALKPHOS 89 82  BILITOT 0.9 1.1  PROT 7.3 6.7  ALBUMIN 3.6 3.2*     Code Status:  DNR.  Code status decision has been confirmed with: Patient.  Additionally, MOST form reviewed that is scanned into [employer] from 2023 Family Communication: none   Severity of Illness:  The appropriate patient status for this patient is INPATIENT. Inpatient status is judged to be reasonable and necessary in order to provide the required intensity of service to ensure the patient's safety. The patient's presenting symptoms, physical exam findings, and initial radiographic and laboratory data in the context of their chronic comorbidities is felt to place them at high risk for further clinical deterioration. Furthermore, it is not anticipated that the patient will be medically stable for discharge from the hospital within 2 midnights of admission.   * I certify that at the point of admission it is my clinical judgment that the patient will require inpatient hospital care spanning beyond 2 midnights from the point of admission due to high intensity of service, high risk for further deterioration and high frequency of surveillance required.*  Time spent:  54 minutes  Author:  Zachary JINNY Ba MD  02/05/2024 10:23 AM

## 2024-02-06 DIAGNOSIS — K56609 Unspecified intestinal obstruction, unspecified as to partial versus complete obstruction: Secondary | ICD-10-CM | POA: Diagnosis not present

## 2024-02-06 DIAGNOSIS — C786 Secondary malignant neoplasm of retroperitoneum and peritoneum: Secondary | ICD-10-CM | POA: Diagnosis not present

## 2024-02-06 DIAGNOSIS — K219 Gastro-esophageal reflux disease without esophagitis: Secondary | ICD-10-CM | POA: Diagnosis not present

## 2024-02-06 DIAGNOSIS — I10 Essential (primary) hypertension: Secondary | ICD-10-CM | POA: Diagnosis not present

## 2024-02-06 DIAGNOSIS — D649 Anemia, unspecified: Secondary | ICD-10-CM

## 2024-02-06 LAB — CBC WITH DIFFERENTIAL/PLATELET
Abs Immature Granulocytes: 0.03 10*3/uL (ref 0.00–0.07)
Basophils Absolute: 0 10*3/uL (ref 0.0–0.1)
Basophils Relative: 0 %
Eosinophils Absolute: 0.1 10*3/uL (ref 0.0–0.5)
Eosinophils Relative: 1 %
HCT: 31.6 % — ABNORMAL LOW (ref 36.0–46.0)
Hemoglobin: 9.9 g/dL — ABNORMAL LOW (ref 12.0–15.0)
Immature Granulocytes: 1 %
Lymphocytes Relative: 15 %
Lymphs Abs: 1 10*3/uL (ref 0.7–4.0)
MCH: 30.3 pg (ref 26.0–34.0)
MCHC: 31.3 g/dL (ref 30.0–36.0)
MCV: 96.6 fL (ref 80.0–100.0)
Monocytes Absolute: 0.8 10*3/uL (ref 0.1–1.0)
Monocytes Relative: 12 %
Neutro Abs: 4.7 10*3/uL (ref 1.7–7.7)
Neutrophils Relative %: 71 %
Platelets: 262 10*3/uL (ref 150–400)
RBC: 3.27 MIL/uL — ABNORMAL LOW (ref 3.87–5.11)
RDW: 12 % (ref 11.5–15.5)
WBC: 6.6 10*3/uL (ref 4.0–10.5)
nRBC: 0 % (ref 0.0–0.2)

## 2024-02-06 LAB — COMPREHENSIVE METABOLIC PANEL
ALT: 15 U/L (ref 0–44)
AST: 15 U/L (ref 15–41)
Albumin: 2.4 g/dL — ABNORMAL LOW (ref 3.5–5.0)
Alkaline Phosphatase: 62 U/L (ref 38–126)
Anion gap: 6 (ref 5–15)
BUN: 23 mg/dL (ref 8–23)
CO2: 31 mmol/L (ref 22–32)
Calcium: 8.9 mg/dL (ref 8.9–10.3)
Chloride: 105 mmol/L (ref 98–111)
Creatinine, Ser: 1.08 mg/dL — ABNORMAL HIGH (ref 0.44–1.00)
GFR, Estimated: 50 mL/min — ABNORMAL LOW (ref >60)
Glucose, Bld: 115 mg/dL — ABNORMAL HIGH (ref 70–99)
Potassium: 3.8 mmol/L (ref 3.5–5.1)
Sodium: 142 mmol/L (ref 135–145)
Total Bilirubin: 0.5 mg/dL (ref 0.0–1.2)
Total Protein: 5.3 g/dL — ABNORMAL LOW (ref 6.5–8.1)

## 2024-02-06 LAB — FERRITIN: Ferritin: 205 ng/mL (ref 11–307)

## 2024-02-06 LAB — FOLATE: Folate: 8.9 ng/mL (ref >5.9)

## 2024-02-06 LAB — IRON AND TIBC
Iron: 28 ug/dL (ref 28–170)
Saturation Ratios: 12 % (ref 10.4–31.8)
TIBC: 230 ug/dL — ABNORMAL LOW (ref 250–450)
UIBC: 202 ug/dL

## 2024-02-06 LAB — CANCER ANTIGEN 19-9: CA 19-9: 18 U/mL (ref 0–35)

## 2024-02-06 LAB — CEA: CEA: 2.7 ng/mL (ref 0.0–4.7)

## 2024-02-06 LAB — CA 125: Cancer Antigen (CA) 125: 584 U/mL — ABNORMAL HIGH (ref 0.0–38.1)

## 2024-02-06 LAB — MAGNESIUM: Magnesium: 1.9 mg/dL (ref 1.7–2.4)

## 2024-02-06 LAB — VITAMIN B12: Vitamin B-12: 419 pg/mL (ref 180–914)

## 2024-02-06 NOTE — Progress Notes (Addendum)
 PROGRESS NOTE   Tracy Preston  FMW:[id number] DOB: Sep 30, 1935 DOA: 02/04/2024 PCP: Delice Charleston, MD (Inactive)   Date of Service: the patient was seen and examined on 02/06/2024  Brief Narrative:  89 year old female with a history of hypertension, chronic kidney disease, family history of breast cancer who presents with abdominal distention and vomiting for approximately 2 weeks presenting to [facility] emergency department by her outpatient provider after CT scan of the abdomen is performed and is found to have findings concerning for new metastatic gynecological malignancy and small bowel obstruction.  Upon evaluation in the emergency department, the hospitalist group was asked to assess the patient for admission the hospital.    General surgery was consulted and recommended NG tube placement set to intermittent suction with intravenous fluids.  They additionally recommended gynecology/oncology evaluation.  Dr. Rogelio with hematology/oncology was consulted and recommended obtaining tumor markers as well as obtaining an IR guided paracentesis for cytology.  This was performed on 2/8.  Plan is for focused pelvic exam at the cancer center on 2/10 and eventual plan for initiation of chemotherapy once tissue diagnosis established.     Assessment & Plan SBO (small bowel obstruction) (HCC) Etiology likely secondary to concurrent malignancy.   NG tube continues to be at low intermittent suction Keeping n.p.o.  Intravenous Protonix  for evidence of small amounts of coffee-ground NG suction on 2/8 likely from gastric wall trauma. Dr. Toth/White with General Surgery following, their input is appreciated As needed opiate-based analgesics for associated pain.  Peritoneal carcinomatosis (HCC) CT imaging of the abdomen and pelvis on 2/7 revealing diffuse peritoneal carcinomatosis, masslike soft tissue prominence in the region of the lower ureter and segment and cervix, mild  retroperitoneal lymphadenopathy and liver metastases Concern for gynecological malignancy as the source supported by presence of elevated CA 125. Dr. Rogelio with gynecology oncology following, her input is appreciated.  Plan is for patient to have focused gynecological examination in the cancer center on 2/10.   IR guided paracentesis for cytology obtained 2/9, 100 cc obtained.  Awaiting final results.   Additional ways definitive diagnosis may be obtained may include biopsy of liver masses or samples taken from the cervix during planned pelvic exam on 2/10  Essential hypertension Holding home regimen of losartan  Patient remains normotensive As needed intravenous angiotensins for markedly elevated blood pressure. GERD (gastroesophageal reflux disease) Currently on intravenous Protonix  as noted above Anemia Notable substantial anemia No clinical evidence of bleeding We will obtain iron panel, folate, ferritin and vitamin b12 Monitoring hemoglobin and hematocrit with serial CBCs     Subjective:  Abdominal discomfort is mild and vague.  Patient reports having occasional flatus but no bowel movements.  Physical Exam:  Vitals:   02/05/24 2158 02/06/24 0556 02/06/24 1432 02/06/24 2106  BP: 137/71 115/63 115/61 122/63  Pulse: 71 72 77 74  Resp: 18 18 18 19   Temp: 97.7 F (36.5 C) 98.6 F (37 C) 98.1 F (36.7 C) 98.7 F (37.1 C)  TempSrc: Oral Oral Oral Oral  SpO2: 93% 96% 94% 95%    Constitutional: Awake alert and oriented x3, no associated distress.   Skin: no rashes, no lesions, poor skin turgor noted. Eyes: Pupils are equally reactive to light.  No evidence of scleral icterus or conjunctival pallor.  ENMT: NG tube in place, dry mucous membranes noted.  Posterior pharynx clear of any exudate or lesions.   Respiratory: clear to auscultation bilaterally, no wheezing, no crackles. Normal respiratory effort. No accessory muscle use.  Cardiovascular: Regular rate and rhythm,  no murmurs / rubs / gallops. No extremity edema. 2+ pedal pulses. No carotid bruits.  Abdomen: Abdomen is soft with mild generalized tenderness.  Notable protuberance of the abdomen without definitive abdominal masses identified.  Positive bowel sounds noted in all quadrants.   Musculoskeletal: No joint deformity upper and lower extremities. Good ROM, no contractures. Normal muscle tone.    Data Reviewed:  I have personally reviewed and interpreted labs, imaging.  Significant findings are   CBC: Recent Labs  Lab 02/04/24 1619 02/05/24 0003 02/05/24 1612 02/06/24 0526  WBC 9.8 8.7 8.9 6.6  NEUTROABS  --   --   --  4.7  HGB 13.2 12.0 10.7* 9.9*  HCT 39.4 36.1 33.3* 31.6*  MCV 94.0 94.8 95.4 96.6  PLT 334 290 286 262   Basic Metabolic Panel: Recent Labs  Lab 02/04/24 1619 02/04/24 2316 02/05/24 0003 02/06/24 0526  NA 137  --  136 142  K 4.4  --  4.0 3.8  CL 98  --  98 105  CO2 24  --  24 31  GLUCOSE 117*  --  125* 115*  BUN 35*  --  33* 23  CREATININE 1.27* 1.34* 1.36* 1.08*  CALCIUM 10.1  --  9.8 8.9  MG  --   --   --  1.9   GFR: CrCl cannot be calculated (Unknown ideal weight.). Liver Function Tests: Recent Labs  Lab 02/04/24 1619 02/05/24 0003 02/06/24 0526  AST 22 19 15   ALT 23 19 15   ALKPHOS 89 82 62  BILITOT 0.9 1.1 0.5  PROT 7.3 6.7 5.3*  ALBUMIN 3.6 3.2* 2.4*     Code Status:  DNR.  Code status decision has been confirmed with: Patient.  Additionally, MOST form reviewed that is scanned into [employer] from 2023 Family Communication: Son and daughter-in-law updated on plan of care at the bedside.   Severity of Illness:  The appropriate patient status for this patient is INPATIENT. Inpatient status is judged to be reasonable and necessary in order to provide the required intensity of service to ensure the patient's safety. The patient's presenting symptoms, physical exam findings, and initial radiographic and laboratory data in the context of their  chronic comorbidities is felt to place them at high risk for further clinical deterioration. Furthermore, it is not anticipated that the patient will be medically stable for discharge from the hospital within 2 midnights of admission.   * I certify that at the point of admission it is my clinical judgment that the patient will require inpatient hospital care spanning beyond 2 midnights from the point of admission due to high intensity of service, high risk for further deterioration and high frequency of surveillance required.*  Time spent:  50 minutes  Author:  Zachary JINNY Ba MD  02/06/2024 9:14 PM

## 2024-02-06 NOTE — Assessment & Plan Note (Addendum)
 Etiology likely secondary to concurrent malignancy.   NG tube continues to be at low intermittent suction Keeping n.p.o.  Intravenous Protonix  for evidence of small amounts of coffee-ground NG suction on 2/8 likely from gastric wall trauma. Dr. Toth/White with General Surgery following, their input is appreciated As needed opiate-based analgesics for associated pain.

## 2024-02-06 NOTE — Assessment & Plan Note (Addendum)
 Currently on intravenous Protonix  as noted above

## 2024-02-06 NOTE — Assessment & Plan Note (Deleted)
 Holding home regimen of losartan  Normotensive As needed intravenous angiotensins for markedly elevated blood pressure.

## 2024-02-06 NOTE — Progress Notes (Signed)
 Mobility Specialist - Progress Note   02/06/24 0949  Mobility  Activity Ambulated with assistance in hallway  Level of Assistance Independent after set-up  Assistive Device Other (Comment) (IV Pole)  Distance Ambulated (ft) 700 ft  Activity Response Tolerated well  Mobility Referral Yes  Mobility visit 1 Mobility  Mobility Specialist Stop Time (ACUTE ONLY) 0948   Pt received in bed and agreeable to mobility. No complaints during session. Pt to bathroom after session with all needs met. Instructed pt to pull call bell when finished. NT made aware.   [facility]

## 2024-02-06 NOTE — Consult Note (Signed)
 Reason for Consult:SBO, concern for gynecologic malignancy Referring Physician: Zachary Ba, MD  Tracy Preston is an 89 y.o. female.  HPI: The patient is an 89 year old female who presents with abdominal swelling and gastrointestinal symptoms. She is accompanied by her family, including her daughter and son-in-law.   She began experiencing abdominal swelling about three weeks ago, accompanied by significant acid reflux. Initially, dietary changes such as avoiding wine and rich foods provided some relief, but the reflux persisted even with bland foods. The abdominal swelling continued to increase, leading to severe heartburn and reduced food intake.  Last Tuesday, she started vomiting, which led her to stop eating altogether. She contacted her doctor on Wednesday and was seen on Thursday, with a CT scan ordered for Friday morning. Following the scan, she was immediately sent to the hospital. Since being admitted and having fluid removed from her abdomen, she no longer feels nauseous, and her heartburn has resolved. She reports minimal gas passage and no significant hunger, although she does not feel nauseous at present. She has not experienced any weight loss until recently, noting a drop from her usual 121-122 pounds to 119 pounds as of her last doctor's visit. Her last substantial meal was on the previous Sunday night, with minimal food intake since then, consisting mainly of crackers and apples. She had a bowel movement on Friday morning after eating some rice and noodles on Monday night.  She has a family history of cancer, with four sisters having had breast cancer and an aunt on her father's side with colon cancer. She has no personal history of cancer or hepatitis.  She was active, walking five miles a day and traveling the world until her recent symptoms began.  No current abdominal pain, vaginal bleeding, and she has not passed significant gas or had a bowel movement since Friday  morning  Past Medical History:  Diagnosis Date   Cancer (HCC)    skin cancer leg -no problems now   GERD (gastroesophageal reflux disease)    mild -no meds   Heart murmur    faint   Hypertension     Past Surgical History:  Procedure Laterality Date   BREAST BIOPSY  05/28/1984   benign-unknown which breast   COLONOSCOPY W/ POLYPECTOMY     COLONOSCOPY WITH PROPOFOL  N/A 08/06/2015   Procedure: COLONOSCOPY WITH PROPOFOL ;  Surgeon: Gladis MARLA Louder, MD;  Location: [facility];  Service: Endoscopy;  Laterality: N/A;   TONSILLECTOMY     child   TUBAL LIGATION      Family History  Problem Relation Age of Onset   Breast cancer Sister    Breast cancer Sister    Breast cancer Sister     Social History:  reports that she has never smoked. She does not have any smokeless tobacco history on file. She reports current alcohol use. She reports that she does not use drugs.  Allergies:  Allergies  Allergen Reactions   Calcium-Containing Compounds Other (See Comments)    Upsets the stomach, so it is not taken very often    Medications: I have reviewed the patient's current medications.  Results for orders placed or performed during the hospital encounter of 02/04/24 (from the past 48 hours)  Lipase, blood     Status: None   Collection Time: 02/04/24  4:19 PM  Result Value Ref Range   Lipase 29 11 - 51 U/L    Comment: Performed at [facility], [address] [place],  [place] [address]  Comprehensive metabolic panel     Status: Abnormal   Collection Time: 02/04/24  4:19 PM  Result Value Ref Range   Sodium 137 135 - 145 mmol/L   Potassium 4.4 3.5 - 5.1 mmol/L   Chloride 98 98 - 111 mmol/L   CO2 24 22 - 32 mmol/L   Glucose, Bld 117 (H) 70 - 99 mg/dL    Comment: Glucose reference range applies only to samples taken after fasting for at least 8 hours.   BUN 35 (H) 8 - 23 mg/dL   Creatinine, Ser 8.72 (H) 0.44 - 1.00 mg/dL   Calcium 89.8 8.9 - 89.6 mg/dL   Total  Protein 7.3 6.5 - 8.1 g/dL   Albumin 3.6 3.5 - 5.0 g/dL   AST 22 15 - 41 U/L   ALT 23 0 - 44 U/L   Alkaline Phosphatase 89 38 - 126 U/L   Total Bilirubin 0.9 0.0 - 1.2 mg/dL   GFR, Estimated 41 (L) >60 mL/min    Comment: (NOTE) Calculated using the CKD-EPI Creatinine Equation (2021)    Anion gap 15 5 - 15    Comment: Performed at [facility], [address], [place], [place] [address]  CBC     Status: None   Collection Time: 02/04/24  4:19 PM  Result Value Ref Range   WBC 9.8 4.0 - 10.5 K/uL   RBC 4.19 3.87 - 5.11 MIL/uL   Hemoglobin 13.2 12.0 - 15.0 g/dL   HCT 60.5 63.9 - 53.9 %   MCV 94.0 80.0 - 100.0 fL   MCH 31.5 26.0 - 34.0 pg   MCHC 33.5 30.0 - 36.0 g/dL   RDW 88.0 88.4 - 84.4 %   Platelets 334 150 - 400 K/uL   nRBC 0.0 0.0 - 0.2 %    Comment: Performed at [facility], [address] [place], [place] [address]  Urinalysis, Routine w reflex microscopic -Urine, Clean Catch     Status: Abnormal   Collection Time: 02/04/24  4:19 PM  Result Value Ref Range   Color, Urine YELLOW YELLOW   APPearance CLEAR CLEAR   Specific Gravity, Urine >1.046 (H) 1.005 - 1.030   pH 5.0 5.0 - 8.0   Glucose, UA NEGATIVE NEGATIVE mg/dL   Hgb urine dipstick SMALL (A) NEGATIVE   Bilirubin Urine NEGATIVE NEGATIVE   Ketones, ur 20 (A) NEGATIVE mg/dL   Protein, ur NEGATIVE NEGATIVE mg/dL   Nitrite NEGATIVE NEGATIVE   Leukocytes,Ua NEGATIVE NEGATIVE   RBC / HPF 0-5 0 - 5 RBC/hpf   WBC, UA 0-5 0 - 5 WBC/hpf   Bacteria, UA NONE SEEN NONE SEEN   Squamous Epithelial / HPF 0-5 0 - 5 /HPF    Comment: Performed at [facility], [address], [place], [place] [address]  Type and screen [facility]     Status: None   Collection Time: 02/04/24  4:19 PM  Result Value Ref Range   ABO/RH(D) B POS    Antibody Screen NEG    Sample Expiration      02/07/2024,2359 Performed at [facility], [address] [place], [place] [address]   ABO/Rh     Status: None   Collection Time: 02/04/24  4:22 PM  Result Value Ref Range   ABO/RH(D)      B POS Performed at [facility], [address], [place], [place] [address]   Creatinine, serum  Status: Abnormal   Collection Time: 02/04/24 11:16 PM  Result Value Ref Range   Creatinine, Ser 1.34 (H) 0.44 - 1.00 mg/dL   GFR, Estimated 39 (L) >60 mL/min    Comment: (NOTE) Calculated using the CKD-EPI Creatinine Equation (2021) Performed at [facility], [address] [place], [place] [address]   Comprehensive metabolic panel     Status: Abnormal   Collection Time: 02/05/24 12:03 AM  Result Value Ref Range   Sodium 136 135 - 145 mmol/L   Potassium 4.0 3.5 - 5.1 mmol/L   Chloride 98 98 - 111 mmol/L   CO2 24 22 - 32 mmol/L   Glucose, Bld 125 (H) 70 - 99 mg/dL    Comment: Glucose reference range applies only to samples taken after fasting for at least 8 hours.   BUN 33 (H) 8 - 23 mg/dL   Creatinine, Ser 8.63 (H) 0.44 - 1.00 mg/dL   Calcium 9.8 8.9 - 89.6 mg/dL   Total Protein 6.7 6.5 - 8.1 g/dL   Albumin 3.2 (L) 3.5 - 5.0 g/dL   AST 19 15 - 41 U/L   ALT 19 0 - 44 U/L   Alkaline Phosphatase 82 38 - 126 U/L   Total Bilirubin 1.1 0.0 - 1.2 mg/dL   GFR, Estimated 38 (L) >60 mL/min    Comment: (NOTE) Calculated using the CKD-EPI Creatinine Equation (2021)    Anion gap 14 5 - 15    Comment: Performed at [facility], [address] [place], [place] [address]  CBC     Status: Abnormal   Collection Time: 02/05/24 12:03 AM  Result Value Ref Range   WBC 8.7 4.0 - 10.5 K/uL   RBC 3.81 (L) 3.87 - 5.11 MIL/uL   Hemoglobin 12.0 12.0 - 15.0 g/dL   HCT 63.8 63.9 - 53.9 %   MCV 94.8 80.0 - 100.0 fL   MCH 31.5 26.0 - 34.0 pg   MCHC 33.2 30.0 - 36.0 g/dL   RDW 88.0 88.4 - 84.4 %   Platelets 290 150 - 400 K/uL   nRBC 0.0 0.0 - 0.2 %    Comment: Performed at [facility], [address] [place], [place] [address]  CA 125     Status: Abnormal   Collection Time: 02/05/24 11:52 AM  Result Value Ref Range   Cancer Antigen (CA) 125 584.0 (H) 0.0 - 38.1 U/mL    Comment: (NOTE) Roche [employer] Electrochemiluminescence Immunoassay (ECLIA) Values obtained with different assay methods or kits cannot be used interchangeably.  Results cannot be interpreted as absolute evidence of the presence or absence of malignant disease. Performed At: [facility] [address] [place], [place] [id number] Jennette Shorter MD Ey:[id number]   CEA     Status: None   Collection Time: 02/05/24 11:52 AM  Result Value Ref Range   CEA 2.7 0.0 - 4.7 ng/mL    Comment: (NOTE)                             Nonsmokers          <3.9                             Smokers             <5.6 Roche [employer] Electrochemiluminescence Immunoassay (ECLIA) Values obtained with different assay methods or kits cannot  be used interchangeably.  Results cannot be interpreted as absolute evidence of the presence or absence of malignant disease. Performed At: [facility] [address] [place], [place] [id number] Jennette Shorter MD Ey:[id number]   Cancer antigen 19-9     Status: None   Collection Time: 02/05/24 11:52 AM  Result Value Ref Range   CA 19-9 18 0 - 35 U/mL    Comment: (NOTE) Roche [employer] Electrochemiluminescence Immunoassay (ECLIA) Values obtained with different assay methods or kits cannot be used interchangeably.  Results cannot be interpreted as absolute evidence of the presence or absence of malignant disease. Performed At: [facility] [address] [place], [place] [id number] Jennette Shorter MD Ey:[id number]   Body fluid cell count with differential     Status: Abnormal   Collection Time: 02/05/24  2:14 PM  Result Value Ref Range   Fluid Type-FCT CYTO PLEU    Color, Fluid YELLOW (A) YELLOW   Appearance, Fluid CLOUDY (A) CLEAR   Total Nucleated  Cell Count, Fluid 2,854 (H) 0 - 1,000 cu mm   Neutrophil Count, Fluid 45 (H) 0 - 25 %   Lymphs, Fluid 46 %   Monocyte-Macrophage-Serous Fluid 9 (L) 50 - 90 %   Eos, Fluid 0 %    Comment: Performed at [facility], [address] [place], [place] [address]  Body fluid culture w Gram Stain     Status: None (Preliminary result)   Collection Time: 02/05/24  2:14 PM   Specimen: PATH Cytology Pleural fluid  Result Value Ref Range   Specimen Description      PLEURAL Performed at [facility], [address], [place], [place] [address]    Special Requests      NONE Performed at [facility], [address], [place], [place] [address]    Gram Stain      WBC SEEN NO ORGANISMS SEEN CYTOSPIN SMEAR Performed at [facility], [address] [place], [place] [address]    Culture PENDING    Report Status PENDING   CBC     Status: Abnormal   Collection Time: 02/05/24  4:12 PM  Result Value Ref Range   WBC 8.9 4.0 - 10.5 K/uL   RBC 3.49 (L) 3.87 - 5.11 MIL/uL   Hemoglobin 10.7 (L) 12.0 - 15.0 g/dL   HCT 66.6 (L) 63.9 - 53.9 %   MCV 95.4 80.0 - 100.0 fL   MCH 30.7 26.0 - 34.0 pg   MCHC 32.1 30.0 - 36.0 g/dL   RDW 87.9 88.4 - 84.4 %   Platelets 286 150 - 400 K/uL   nRBC 0.0 0.0 - 0.2 %    Comment: Performed at [facility], [address], [place], [place] [address]  CBC with Differential/Platelet     Status: Abnormal   Collection Time: 02/06/24  5:26 AM  Result Value Ref Range   WBC 6.6 4.0 - 10.5 K/uL   RBC 3.27 (L) 3.87 - 5.11 MIL/uL   Hemoglobin 9.9 (L) 12.0 - 15.0 g/dL   HCT 68.3 (L) 63.9 - 53.9 %   MCV 96.6 80.0 - 100.0 fL   MCH 30.3 26.0 - 34.0 pg   MCHC 31.3 30.0 - 36.0 g/dL   RDW 87.9 88.4 - 84.4 %   Platelets 262 150 - 400 K/uL   nRBC 0.0 0.0 - 0.2 %   Neutrophils Relative % 71 %   Neutro Abs 4.7 1.7 - 7.7 K/uL  Lymphocytes Relative 15 %   Lymphs Abs 1.0 0.7 - 4.0 K/uL   Monocytes  Relative 12 %   Monocytes Absolute 0.8 0.1 - 1.0 K/uL   Eosinophils Relative 1 %   Eosinophils Absolute 0.1 0.0 - 0.5 K/uL   Basophils Relative 0 %   Basophils Absolute 0.0 0.0 - 0.1 K/uL   Immature Granulocytes 1 %   Abs Immature Granulocytes 0.03 0.00 - 0.07 K/uL    Comment: Performed at [facility], [address], [place], [place] [address]  Comprehensive metabolic panel     Status: Abnormal   Collection Time: 02/06/24  5:26 AM  Result Value Ref Range   Sodium 142 135 - 145 mmol/L   Potassium 3.8 3.5 - 5.1 mmol/L   Chloride 105 98 - 111 mmol/L   CO2 31 22 - 32 mmol/L   Glucose, Bld 115 (H) 70 - 99 mg/dL    Comment: Glucose reference range applies only to samples taken after fasting for at least 8 hours.   BUN 23 8 - 23 mg/dL   Creatinine, Ser 8.91 (H) 0.44 - 1.00 mg/dL   Calcium 8.9 8.9 - 89.6 mg/dL   Total Protein 5.3 (L) 6.5 - 8.1 g/dL   Albumin 2.4 (L) 3.5 - 5.0 g/dL   AST 15 15 - 41 U/L   ALT 15 0 - 44 U/L   Alkaline Phosphatase 62 38 - 126 U/L   Total Bilirubin 0.5 0.0 - 1.2 mg/dL   GFR, Estimated 50 (L) >60 mL/min    Comment: (NOTE) Calculated using the CKD-EPI Creatinine Equation (2021)    Anion gap 6 5 - 15    Comment: Performed at [facility], [address], [place], [place] [address]  Magnesium      Status: None   Collection Time: 02/06/24  5:26 AM  Result Value Ref Range   Magnesium  1.9 1.7 - 2.4 mg/dL    Comment: Performed at [facility], [address], [place], [place] [address]  Ferritin     Status: None   Collection Time: 02/06/24  8:56 AM  Result Value Ref Range   Ferritin 205 11 - 307 ng/mL    Comment: Performed at [facility], [address], [place], [place] [address]  Folate     Status: None   Collection Time: 02/06/24  8:56 AM  Result Value Ref Range   Folate 8.9 >5.9 ng/mL    Comment: Performed at [facility], [address] [place],  [place] [address]  Iron and TIBC     Status: Abnormal   Collection Time: 02/06/24  8:56 AM  Result Value Ref Range   Iron 28 28 - 170 ug/dL   TIBC 769 (L) 749 - 549 ug/dL   Saturation Ratios 12 10.4 - 31.8 %   UIBC 202 ug/dL    Comment: Performed at [facility], [address] [place], [place] [address]  Vitamin B12     Status: None   Collection Time: 02/06/24  8:56 AM  Result Value Ref Range   Vitamin B-12 419 180 - 914 pg/mL    Comment: (NOTE) This assay is not validated for testing neonatal or myeloproliferative syndrome specimens for Vitamin B12 levels. Performed at [facility], [address], [place], [place] [address]     US  Paracentesis Result Date: 02/05/2024 INDICATION: 89 year old with new abdominal distention, ascites. Request made for diagnostic and therapeutic paracentesis. EXAM:  ULTRASOUND GUIDED DIAGNOSTIC  PARACENTESIS MEDICATIONS: 10 mL 1% lidocaine . COMPLICATIONS: None immediate. PROCEDURE: Informed written consent was obtained from the patient after a discussion of the risks, benefits and alternatives to treatment. A timeout was performed prior to the initiation of the procedure. Initial ultrasound scanning demonstrates a small amount of ascites within the left lateral abdomen interlooped with bowel. The left lateral abdomen was prepped and draped in the usual sterile fashion. 1% lidocaine  was used for local anesthesia. Following this, a 19 gauge, 7-cm, Yueh catheter was introduced. An ultrasound image was saved for documentation purposes. The paracentesis was performed. The catheter was removed and a dressing was applied. The patient tolerated the procedure well without immediate post procedural complication. FINDINGS: A total of approximately 100 mL of yellow fluid was removed. Samples were sent to the laboratory as requested by the clinical team. IMPRESSION: Successful ultrasound-guided paracentesis yielding 100 mL of peritoneal fluid.  Performed by: Kacie Matthews PA-C Electronically Signed   By: Wilkie Lent M.D.   On: 02/05/2024 14:31   US  PELVIC COMPLETE WITH TRANSVAGINAL Result Date: 02/05/2024 CLINICAL DATA:  89 year old female with ascites, peritoneal carcinomatosis on CT Abdomen and Pelvis yesterday. EXAM: TRANSABDOMINAL AND TRANSVAGINAL ULTRASOUND OF PELVIS TECHNIQUE: Both transabdominal and transvaginal ultrasound examinations of the pelvis were performed. Transabdominal technique was performed for global imaging of the pelvis including uterus, ovaries, adnexal regions, and pelvic cul-de-sac. It was necessary to proceed with endovaginal exam following the transabdominal exam to visualize the ovaries. COMPARISON:  CT Abdomen and Pelvis 02/04/2024. FINDINGS: Uterus Measurements: 6.9 x 3.9 x 3.9 cm = volume: 41 mL. No obvious myometrial mass. Endometrium Heterogeneous hypoechoic area at the fundus endometrium (image 49 of series 1). This area encompasses about 2 cm (image 55). See also cine series 3 images. Otherwise the echogenic endometrial stripe is up to 5 mm. Right ovary Measurements: 3.5 x 2.1 x 2.8 cm = volume: 11 mL. No ovarian mass identified. Left ovary Measurements: 2.3 x 1.5 x 1.7 cm = volume: 3 mL. No ovarian mass identified. Other findings Small volume ascites again visible in the pelvis. IMPRESSION: 1. No ovarian mass identified by ultrasound. 2. There is a 2 cm heterogeneous area at the uterine fundus which is favored to be endometrial related. Elsewhere the endometrial stripe is about 5 mm. An Endometrial Malignancy is not excluded. 3. Ascites, in conjunction with the extensive additional abdominal and pelvic abnormalities demonstrated by CT yesterday. Electronically Signed   By: VEAR Hurst M.D.   On: 02/05/2024 12:17   DG Chest Portable 1 View Result Date: 02/05/2024 CLINICAL DATA:  NG tube replacement confirmation EXAM: PORTABLE CHEST - 1 VIEW COMPARISON:  02/04/2024 FINDINGS: Cardiomediastinal silhouette and  pulmonary vasculature are within normal limits. Lungs are clear. Nasogastric tube extends to the left upper quadrant the side hole located at the level of the gastroesophageal junction. It is slightly retracted compared to prior exam. IMPRESSION: Nasogastric tube terminates in the left upper quadrant in the expected site of the stomach. The side hole of the NG tube is located at the level of the gastroesophageal junction. Electronically Signed   By: Aliene Lloyd M.D.   On: 02/05/2024 07:38   DG Chest Portable 1 View Result Date: 02/04/2024 CLINICAL DATA:  NG tube placement. Abdominal discomfort, heartburn, and nausea. EXAM: PORTABLE CHEST 1 VIEW COMPARISON:  None Available. FINDINGS: The heart size and mediastinal contours are within normal limits. There is atherosclerotic calcification of the aorta. No consolidation, effusion, or pneumothorax. An enteric tube  terminates in the stomach. No acute osseous abnormality. IMPRESSION: 1. No active disease. 2. Enteric tube terminates in the stomach. Electronically Signed   By: Leita Birmingham M.D.   On: 02/04/2024 21:20    Review of Systems  Gastrointestinal:  Positive for abdominal distention.  Genitourinary:  Negative for difficulty urinating and vaginal bleeding.   Blood pressure 115/63, pulse 72, temperature 98.6 F (37 C), temperature source Oral, resp. rate 18, SpO2 96%. Physical Exam Constitutional:      Comments: Thin  Abdominal:     General: Bowel sounds are decreased. There is distension.     Palpations: Abdomen is soft.     Tenderness: There is no abdominal tenderness.  Musculoskeletal:     Right lower leg: No edema.     Left lower leg: No edema.  Skin:    General: Skin is warm.  Neurological:     General: No focal deficit present.  Psychiatric:        Mood and Affect: Mood normal.     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 Elevated CA 125.   Symptomatic improvement w/bowel decompression/NGT. -Continue supportive, conservative management for now -Review the cystology from the paracentesis -Possible pelvic exam at the Cancer Center -Consider TPN for prolonged SBO/NGT -Anticipate she will be a candidate for chemotherapy after a tissue diagnosis is established  [place], MD GYN-Oncology 02/06/2024, 11:33 AM

## 2024-02-06 NOTE — Assessment & Plan Note (Deleted)
 NG tube at low intermittent suction Keeping n.p.o. managing conservatively for now. Intravenous Protonix  for evidence of small amounts of coffee-ground NG suction Dr. Curvin with General Surgery following, their input is appreciated As needed opiate-based analgesics for associated pain.

## 2024-02-06 NOTE — Plan of Care (Signed)
   Problem: Education: Goal: Knowledge of General Education information will improve Description Including pain rating scale, medication(s)/side effects and non-pharmacologic comfort measures Outcome: Progressing   Problem: Health Behavior/Discharge Planning: Goal: Ability to manage health-related needs will improve Outcome: Progressing

## 2024-02-06 NOTE — Assessment & Plan Note (Deleted)
 Currently on intravenous Protonix  as noted above

## 2024-02-06 NOTE — Assessment & Plan Note (Deleted)
 CT imaging of the abdomen and pelvis on 2/7 revealing diffuse peritoneal carcinomatosis, masslike soft tissue prominence in the region of the lower ureter and segment and cervix, mild retroperitoneal lymphadenopathy and liver metastases Concern for gynecological malignancy as the source Case discussed with Dr. Rogelio with gynecology oncology who recommended IR consultation for obtaining paracentesis with cytology as well as evaluation for potential biopsy of one of the identified lesions. IR successfully obtained 100 cc of fluid via paracentesis, cytology and cell count obtained.

## 2024-02-06 NOTE — Assessment & Plan Note (Signed)
 Notable substantial anemia No clinical evidence of bleeding We will obtain iron panel, folate, ferritin and vitamin b12 Monitoring hemoglobin and hematocrit with serial CBCs

## 2024-02-06 NOTE — Assessment & Plan Note (Addendum)
 CT imaging of the abdomen and pelvis on 2/7 revealing diffuse peritoneal carcinomatosis, masslike soft tissue prominence in the region of the lower ureter and segment and cervix, mild retroperitoneal lymphadenopathy and liver metastases Concern for gynecological malignancy as the source supported by presence of elevated CA 125. Dr. Rogelio with gynecology oncology following, her input is appreciated.  Plan is for patient to have focused gynecological examination in the cancer center on 2/10.   IR guided paracentesis for cytology obtained 2/9, 100 cc obtained.  Awaiting final results.   Additional ways definitive diagnosis may be obtained may include biopsy of liver masses or samples taken from the cervix during planned pelvic exam on 2/10

## 2024-02-06 NOTE — Assessment & Plan Note (Addendum)
 Holding home regimen of losartan  Patient remains normotensive As needed intravenous angiotensins for markedly elevated blood pressure.

## 2024-02-06 NOTE — Progress Notes (Signed)
  Subjective No acute events.  Procedure from yesterday has been noted.  Reports she is feeling quite well.  NG tube in place, denies any nausea or vomiting.  Has had some flatus, no bowel movements.  Denies any abdominal pain.  Objective: Vital signs in last 24 hours: Temp:  [97.7 F (36.5 C)-98.6 F (37 C)] 98.6 F (37 C) (02/09 0556) Pulse Rate:  [70-74] 72 (02/09 0556) Resp:  [18] 18 (02/09 0556) BP: (110-137)/(61-75) 115/63 (02/09 0556) SpO2:  [89 %-96 %] 96 % (02/09 0556) Last BM Date : 02/04/24  Intake/Output from previous day: 02/08 0701 - 02/09 0700 In: 2702 [I.V.:2702] Out: 1450 [Urine:900; Emesis/NG output:550] Intake/Output this shift: No intake/output data recorded.  Gen: NAD, comfortable CV: RRR Pulm: Normal work of breathing Abd: Soft, mild to moderate distention, nontender.  No rebound or guarding. Ext: SCDs in place  Lab Results: CBC  Recent Labs    02/05/24 1612 02/06/24 0526  WBC 8.9 6.6  HGB 10.7* 9.9*  HCT 33.3* 31.6*  PLT 286 262   BMET Recent Labs    02/05/24 0003 02/06/24 0526  NA 136 142  K 4.0 3.8  CL 98 105  CO2 24 31  GLUCOSE 125* 115*  BUN 33* 23  CREATININE 1.36* 1.08*  CALCIUM 9.8 8.9   PT/INR No results for input(s): LABPROT, INR in the last 72 hours. ABG No results for input(s): PHART, HCO3 in the last 72 hours.  Invalid input(s): PCO2, PO2  Studies/Results:  Anti-infectives: Anti-infectives (From admission, onward)    None        Assessment/Plan: Patient Active Problem List   Diagnosis Date Noted   SBO (small bowel obstruction) (HCC) 02/04/2024   GERD (gastroesophageal reflux disease) 02/04/2024   Essential hypertension 02/04/2024   Peritoneal carcinomatosis (HCC) 02/04/2024   At least pSBO; suspected secondary to peritoneal carcinomatosis with evidence of suspected malignant ascites, origin has not been definitively established yet  Paracentesis 02/05/24-100 cc of yellow fluid sent for  cytology.  Noted high cell count at 2850  CT imaging notes - concern for mass exuding from the cervix  Recommendations have been made for attempted biopsy of this mass with Gyn/Onc -would recommend reaching out to their service for further evaluation. Would also recommend engagement with medical oncology.  Regarding her apparent suspected partial small bowel obstruction, continue NG tube to low intermittent wall suction; NPO; IV fluid per primary  All the above has been reviewed with her.  All of her questions were answered to her satisfaction, she expressed understanding, and agreement with the plan.   LOS: 2 days   I spent a total of 35 minutes in both face-to-face and non-face-to-face activities, excluding procedures performed, for this visit on the date of this encounter.  Lonni Pizza, MD [facility], [facility]

## 2024-02-07 ENCOUNTER — Encounter (HOSPITAL_COMMUNITY): Payer: Self-pay | Admitting: Internal Medicine

## 2024-02-07 DIAGNOSIS — C772 Secondary and unspecified malignant neoplasm of intra-abdominal lymph nodes: Secondary | ICD-10-CM | POA: Diagnosis not present

## 2024-02-07 DIAGNOSIS — C787 Secondary malignant neoplasm of liver and intrahepatic bile duct: Secondary | ICD-10-CM

## 2024-02-07 DIAGNOSIS — R188 Other ascites: Secondary | ICD-10-CM

## 2024-02-07 DIAGNOSIS — R971 Elevated cancer antigen 125 [CA 125]: Secondary | ICD-10-CM

## 2024-02-07 DIAGNOSIS — K56609 Unspecified intestinal obstruction, unspecified as to partial versus complete obstruction: Secondary | ICD-10-CM | POA: Diagnosis not present

## 2024-02-07 DIAGNOSIS — C801 Malignant (primary) neoplasm, unspecified: Secondary | ICD-10-CM | POA: Diagnosis not present

## 2024-02-07 DIAGNOSIS — C786 Secondary malignant neoplasm of retroperitoneum and peritoneum: Secondary | ICD-10-CM | POA: Diagnosis not present

## 2024-02-07 LAB — COMPREHENSIVE METABOLIC PANEL
ALT: 13 U/L (ref 0–44)
AST: 16 U/L (ref 15–41)
Albumin: 2.5 g/dL — ABNORMAL LOW (ref 3.5–5.0)
Alkaline Phosphatase: 69 U/L (ref 38–126)
Anion gap: 9 (ref 5–15)
BUN: 18 mg/dL (ref 8–23)
CO2: 26 mmol/L (ref 22–32)
Calcium: 8.6 mg/dL — ABNORMAL LOW (ref 8.9–10.3)
Chloride: 106 mmol/L (ref 98–111)
Creatinine, Ser: 1.06 mg/dL — ABNORMAL HIGH (ref 0.44–1.00)
GFR, Estimated: 51 mL/min — ABNORMAL LOW (ref >60)
Glucose, Bld: 86 mg/dL (ref 70–99)
Potassium: 3.7 mmol/L (ref 3.5–5.1)
Sodium: 141 mmol/L (ref 135–145)
Total Bilirubin: 0.5 mg/dL (ref 0.0–1.2)
Total Protein: 5.4 g/dL — ABNORMAL LOW (ref 6.5–8.1)

## 2024-02-07 LAB — CBC WITH DIFFERENTIAL/PLATELET
Abs Immature Granulocytes: 0.04 10*3/uL (ref 0.00–0.07)
Basophils Absolute: 0 10*3/uL (ref 0.0–0.1)
Basophils Relative: 0 %
Eosinophils Absolute: 0.1 10*3/uL (ref 0.0–0.5)
Eosinophils Relative: 1 %
HCT: 34.3 % — ABNORMAL LOW (ref 36.0–46.0)
Hemoglobin: 10.8 g/dL — ABNORMAL LOW (ref 12.0–15.0)
Immature Granulocytes: 1 %
Lymphocytes Relative: 14 %
Lymphs Abs: 1 10*3/uL (ref 0.7–4.0)
MCH: 30.5 pg (ref 26.0–34.0)
MCHC: 31.5 g/dL (ref 30.0–36.0)
MCV: 96.9 fL (ref 80.0–100.0)
Monocytes Absolute: 0.8 10*3/uL (ref 0.1–1.0)
Monocytes Relative: 12 %
Neutro Abs: 5.2 10*3/uL (ref 1.7–7.7)
Neutrophils Relative %: 72 %
Platelets: 288 10*3/uL (ref 150–400)
RBC: 3.54 MIL/uL — ABNORMAL LOW (ref 3.87–5.11)
RDW: 12 % (ref 11.5–15.5)
WBC: 7.2 10*3/uL (ref 4.0–10.5)
nRBC: 0 % (ref 0.0–0.2)

## 2024-02-07 MED ORDER — METOPROLOL TARTRATE 5 MG/5ML IV SOLN: 5.0000 mg | INTRAVENOUS | Status: DC | PRN

## 2024-02-07 MED ORDER — TRAZODONE HCL 50 MG PO TABS: 50.0000 mg | ORAL_TABLET | Freq: Every evening | ORAL | Status: DC | PRN

## 2024-02-07 MED ORDER — SENNOSIDES-DOCUSATE SODIUM 8.6-50 MG PO TABS: 1.0000 | ORAL_TABLET | Freq: Every evening | ORAL | Status: DC | PRN

## 2024-02-07 MED ORDER — IPRATROPIUM-ALBUTEROL 0.5-2.5 (3) MG/3ML IN SOLN
3.0000 mL | RESPIRATORY_TRACT | Status: DC | PRN
  Administered 2024-02-15: 3 mL via RESPIRATORY_TRACT
  Filled 2024-02-07: qty 3

## 2024-02-07 MED ORDER — GUAIFENESIN 100 MG/5ML PO LIQD: 5.0000 mL | ORAL | Status: DC | PRN

## 2024-02-07 MED ORDER — ACETAMINOPHEN 325 MG PO TABS: 650.0000 mg | ORAL_TABLET | Freq: Four times a day (QID) | ORAL | Status: DC | PRN

## 2024-02-07 NOTE — Progress Notes (Signed)
 Subjective: CC: Feeling overall better since admission - stable from yesterday.  No nausea. Abdomen feels tight with continued bloating. Small amount of flatus. No BM. NGT w/ 600cc/24 hours.  Her 2 sons and grandson are at bedside.  She reports she is about to go to the cancer center for a pelvic exam.   Objective: Vital signs in last 24 hours: Temp:  [98.1 F (36.7 C)-98.7 F (37.1 C)] 98.7 F (37.1 C) (02/10 0613) Pulse Rate:  [66-77] 66 (02/10 0613) Resp:  [18-19] 18 (02/10 0613) BP: (115-138)/(61-78) 138/78 (02/10 0613) SpO2:  [94 %-97 %] 97 % (02/10 0613) Last BM Date : 02/04/24  Intake/Output from previous day: 02/09 0701 - 02/10 0700 In: 0  Out: 1050 [Urine:450; Emesis/NG output:600] Intake/Output this shift: No intake/output data recorded.  PE: Gen:  Alert, NAD, pleasant Abd: Soft, at least mild distension, NT, some BS. NGT in place on LIWS.   Lab Results:  Recent Labs    02/06/24 0526 02/07/24 0449  WBC 6.6 7.2  HGB 9.9* 10.8*  HCT 31.6* 34.3*  PLT 262 288   BMET Recent Labs    02/06/24 0526 02/07/24 0449  NA 142 141  K 3.8 3.7  CL 105 106  CO2 31 26  GLUCOSE 115* 86  BUN 23 18  CREATININE 1.08* 1.06*  CALCIUM 8.9 8.6*   PT/INR No results for input(s): "LABPROT", "INR" in the last 72 hours. CMP     Component Value Date/Time   NA 141 02/07/2024 0449   K 3.7 02/07/2024 0449   CL 106 02/07/2024 0449   CO2 26 02/07/2024 0449   GLUCOSE 86 02/07/2024 0449   BUN 18 02/07/2024 0449   CREATININE 1.06 (H) 02/07/2024 0449   CALCIUM 8.6 (L) 02/07/2024 0449   PROT 5.4 (L) 02/07/2024 0449   ALBUMIN 2.5 (L) 02/07/2024 0449   AST 16 02/07/2024 0449   ALT 13 02/07/2024 0449   ALKPHOS 69 02/07/2024 0449   BILITOT 0.5 02/07/2024 0449   GFRNONAA 51 (L) 02/07/2024 0449   Lipase     Component Value Date/Time   LIPASE 29 02/04/2024 1619    Studies/Results: US  Paracentesis Result Date: 02/05/2024 INDICATION: 89 year old with new abdominal  distention, ascites. Request made for diagnostic and therapeutic paracentesis. EXAM: ULTRASOUND GUIDED DIAGNOSTIC  PARACENTESIS MEDICATIONS: 10 mL 1% lidocaine . COMPLICATIONS: None immediate. PROCEDURE: Informed written consent was obtained from the patient after a discussion of the risks, benefits and alternatives to treatment. A timeout was performed prior to the initiation of the procedure. Initial ultrasound scanning demonstrates a small amount of ascites within the left lateral abdomen interlooped with bowel. The left lateral abdomen was prepped and draped in the usual sterile fashion. 1% lidocaine  was used for local anesthesia. Following this, a 19 gauge, 7-cm, Yueh catheter was introduced. An ultrasound image was saved for documentation purposes. The paracentesis was performed. The catheter was removed and a dressing was applied. The patient tolerated the procedure well without immediate post procedural complication. FINDINGS: A total of approximately 100 mL of yellow fluid was removed. Samples were sent to the laboratory as requested by the clinical team. IMPRESSION: Successful ultrasound-guided paracentesis yielding 100 mL of peritoneal fluid. Performed by: Kacie Matthews PA-C Electronically Signed   By: Fernando Hoyer M.D.   On: 02/05/2024 14:31   US  PELVIC COMPLETE WITH TRANSVAGINAL Result Date: 02/05/2024 CLINICAL DATA:  89 year old female with ascites, peritoneal carcinomatosis on CT Abdomen and Pelvis yesterday. EXAM: TRANSABDOMINAL AND  TRANSVAGINAL ULTRASOUND OF PELVIS TECHNIQUE: Both transabdominal and transvaginal ultrasound examinations of the pelvis were performed. Transabdominal technique was performed for global imaging of the pelvis including uterus, ovaries, adnexal regions, and pelvic cul-de-sac. It was necessary to proceed with endovaginal exam following the transabdominal exam to visualize the ovaries. COMPARISON:  CT Abdomen and Pelvis 02/04/2024. FINDINGS: Uterus Measurements: 6.9 x  3.9 x 3.9 cm = volume: 41 mL. No obvious myometrial mass. Endometrium Heterogeneous hypoechoic area at the fundus endometrium (image 49 of series 1). This area encompasses about 2 cm (image 55). See also cine series 3 images. Otherwise the echogenic endometrial stripe is up to 5 mm. Right ovary Measurements: 3.5 x 2.1 x 2.8 cm = volume: 11 mL. No ovarian mass identified. Left ovary Measurements: 2.3 x 1.5 x 1.7 cm = volume: 3 mL. No ovarian mass identified. Other findings Small volume ascites again visible in the pelvis. IMPRESSION: 1. No ovarian mass identified by ultrasound. 2. There is a 2 cm heterogeneous area at the uterine fundus which is favored to be endometrial related. Elsewhere the endometrial stripe is about 5 mm. An Endometrial Malignancy is not excluded. 3. Ascites, in conjunction with the extensive additional abdominal and pelvic abnormalities demonstrated by CT yesterday. Electronically Signed   By: Marlise Simpers M.D.   On: 02/05/2024 12:17    Anti-infectives: Anti-infectives (From admission, onward)    None        Assessment/Plan pSBO - pSBO Suspected secondary to peritoneal carcinomatosis with evidence of suspected malignant ascites, origin has not been definitively established yet. CT w/ masslike soft tissue prominence in the region of the lower uterine segment and cervix, Liver metastases and Mild retroperitoneal lymphadenopathy. CA -125 noted to be elevated at 584  - S/p Paracentesis 02/05/24-100 cc of yellow fluid sent for cytology.  Noted high cell count at 2850. Cytology pending.  - GYN ONC following. They are reported to be planning pelvic exam today.  - Would also recommend engagement with medical oncology.  Regarding her apparent suspected partial small bowel obstruction, continue NG tube to low intermittent wall suction; NPO; IV fluid per primary. Okay to clamp ngt for mobilization.    All the above has been reviewed with her.  All of her questions were answered to her  satisfaction, she expressed understanding, and agreement with the plan.  FEN - NPO, NGT to LIWS, IVF per primary  VTE - SCDs, Lovenox  ID - None  I reviewed nursing notes, last 24 h vitals and pain scores, last 48 h intake and output, last 24 h labs and trends, and last 24 h imaging results.   LOS: 3 days    Delton Filbert , [facility] 02/07/2024, 9:57 AM Please see Amion for pager number during day hours 7:00am-4:30pm

## 2024-02-07 NOTE — TOC CM/SW Note (Signed)
 Transition of Care [facility]) - Inpatient Brief Assessment   Patient Details  Name: Tracy Preston MRN: [id number] Date of Birth: 07-28-1935  Transition of [facility] CM/SW Contact:    Bari Leys, RN Phone Number: 02/07/2024, 2:23 PM   Clinical Narrative: Met with pt and family at bedside to introduce role of TOC/NCM and review for dc planning, pt reports she has an established PCP and pharmacy, no current home care services or home DME, reports she feels safe returning home with support from family, confirmed transportation is available at discharge. TOC Brief Assessment completed. No TOC needs identified at this time.     Transition of Care Asessment: Insurance and Status: Insurance coverage has been reviewed Patient has primary care physician: Yes Home environment has been reviewed: resides alone with support from family Prior level of function:: Independent Prior/Current Home Services: No current home services Social Drivers of Health Review: SDOH reviewed no interventions necessary Readmission risk has been reviewed: Yes Transition of care needs: no transition of care needs at this time

## 2024-02-07 NOTE — Plan of Care (Signed)
   Problem: Clinical Measurements: Goal: Will remain free from infection Outcome: Progressing Goal: Diagnostic test results will improve Outcome: Progressing

## 2024-02-07 NOTE — Progress Notes (Signed)
 Inpatient GYN Oncology Progress Note  Subjective: Patient reports doing well this am. Nausea and reflux improved after NG tube placement. No significant flatus passed or BM. Felt relief after paracentesis. Reports at home prior to admission, she started having moderate reflux and abdominal distention, which started around 3 weeks ago. The reflux caused decrease in food intake and nausea. She reached out to her PCP when she started vomiting on 02/01/24 and ended up having a CT scan on 02/04/24 which returned with evidence of widespread metastatic disease in the abdomen/pelvis.    She denies vaginal bleeding, spotting or discharge prior to admission and after age of menopause. She was active prior to admission. Family history of cancer with four sisters having had breast cancer and a paternal aunt with colon cancer. She has no personal history of cancer or hepatitis.   Also reports that she had pap smears collected through her 67s. Denies any abnormal pap smears.  Objective: Vital signs in last 24 hours: Temp:  [98.1 F (36.7 C)-98.7 F (37.1 C)] 98.7 F (37.1 C) (02/10 0613) Pulse Rate:  [66-77] 66 (02/10 0613) Resp:  [18-19] 18 (02/10 1610) BP: (115-138)/(61-78) 138/78 (02/10 9604) SpO2:  [94 %-97 %] 97 % (02/10 0613) Last BM Date : 02/04/24  Intake/Output from previous day: 02/09 0701 - 02/10 0700 In: 0  Out: 1050 [Urine:450; Emesis/NG output:600]  Physical Examination: General: alert, cooperative, and no distress Resp: clear to auscultation bilaterally Cardio: regular rate and rhythm, S1, S2 normal, no murmur, click, rub or gallop GI:   Extremities: extremities normal, atraumatic, no cyanosis or edema   General: Alert, oriented, no acute distress. HEENT: Normocephalic, atraumatic. NGT in place, clamped. Chest: Normal work of breathing. Clear to auscultation bilaterally.   Cardiovascular: Regular rate and rhythm, no murmurs. Abdomen: Soft, nontender.  Decreased bowel sounds.  Distended and tympanitic abdomen.  Extremities: Grossly normal range of motion.  Warm, well perfused.  No edema bilaterally. Skin: No rashes or lesions noted. Lymphatics: No cervical, supraclavicular, or inguinal adenopathy. GU: Normal appearing external genitalia without erythema, excoriation, or lesions.  Speculum exam reveals normal vaginal mucosa.  Normal-appearing cervix which is deviated to patient right..  Bimanual exam reveals uterus small and mobile, deviated to the left, possible fullness in the posterior cul-de-sac.  Discrete mass difficult to palpate due to abdominal distention.  Rectovaginal exam stool in vaginal vault.  No clear nodularit.. Exam chaperoned by Vira Grieves, NP   EMB Procedure: After appropriate verbal informed consent was obtained, a timeout was performed. A sterile speculum was placed in the vagina, and the area was cleaned with betadine x3. An endometrial biopsy pipelle was advanced carefully to the uterine fundus which sounded to 7.5cm. A minimal sample was obtained over 2 passes. The tenaculum was removed, and tenaculum sites were noted to be hemostatic. The speculum was removed from the vagina. The patient tolerated the procedure well.   UPT: n/a  Labs: WBC/Hgb/Hct/Plts:  7.2/10.8/34.3/288 (02/10 0449) BUN/Cr/glu/ALT/AST/amyl/lip:  18/1.06/--/13/16/--/-- (02/10 0449)  CT AP 02/04/24: -Diffuse peritoneal carcinomatosis and mild ascites. -Distal small bowel obstruction, with transition point in the right lower quadrant, likely due to peritoneal carcinomatosis. -Masslike soft tissue prominence in the region of the lower uterine segment and cervix, possibly representing primary cervical or endometrial carcinoma. -Liver metastases. -Mild retroperitoneal lymphadenopathy, consistent with metastatic disease. -Rectal intussusception incidentally noted. No definite lead mass identified.  US  on 02/05/24: 1. No ovarian mass identified by ultrasound. 2. There is a 2 cm  heterogeneous area at the  uterine fundus which is favored to be endometrial related. Elsewhere the endometrial stripe is about 5 mm. An Endometrial Malignancy is not excluded. 3. Ascites, in conjunction with the extensive additional abdominal and pelvic abnormalities demonstrated by CT yesterday.  Assessment: 89 y.o. admitted for SBO with imaging demonstrating ascites, 6.7cm abdominal mass, diffuse peritoneal carcinomatosis, liver lesions concerning for metastases, and soft tissue prominence in the region of the cervix/lower uterine segment as well as in the adnexal regions.  Elevated CA125.  Together, this is concerning for possible advanced stage GYN malignancy.  Patient underwent paracentesis on 02/05/2024.  Cytology in process. Endometrial biopsy collected today and will send for [facility] pathology.  Reviewed findings to date with patient and her family members.  At this time we will await pathology results to better determine our next steps.  If this were to represent advanced ovarian cancer, we discussed that treatment would first be initiated with neoadjuvant chemotherapy.  In this setting, could consider G-tube and TPN if no resolution in SBO with conservative treatment.  Also briefly reviewed that in the setting of endometrial cancer we typically add immunotherapy to chemotherapy for advanced stage.  Her sample on her endometrial biopsy was minimal so this looks less concerning for source of disease at this time but will follow-up final results.  Recommendations: - We will follow-up pathology results and determine next steps pending these results. - Continue conservative management of SBO at this time.    LOS: 3 days    Suellyn Emory 02/07/2024, 10:50 AM

## 2024-02-07 NOTE — Progress Notes (Signed)
 Mobility Specialist - Progress Note   02/07/24 0902  Mobility  Activity Ambulated with assistance in hallway  Level of Assistance Standby assist, set-up cues, supervision of patient - no hands on  Assistive Device Front wheel walker  Distance Ambulated (ft) 300 ft  Range of Motion/Exercises Active  Activity Response Tolerated well  Mobility Referral Yes  Mobility visit 1 Mobility  Mobility Specialist Start Time (ACUTE ONLY) 0845  Mobility Specialist Stop Time (ACUTE ONLY) 0902  Mobility Specialist Time Calculation (min) (ACUTE ONLY) 17 min   Pt was found in bed and agreeable to ambulate. No complaints with session. At EOS returned to bed with all needs met. Call bell in reach and family in room.  Lorna Rose Mobility Specialist

## 2024-02-08 ENCOUNTER — Inpatient Hospital Stay (HOSPITAL_COMMUNITY): Payer: Medicare PPO

## 2024-02-08 ENCOUNTER — Other Ambulatory Visit: Payer: Self-pay

## 2024-02-08 DIAGNOSIS — Z4682 Encounter for fitting and adjustment of non-vascular catheter: Secondary | ICD-10-CM | POA: Diagnosis not present

## 2024-02-08 DIAGNOSIS — E440 Moderate protein-calorie malnutrition: Secondary | ICD-10-CM

## 2024-02-08 DIAGNOSIS — Z452 Encounter for adjustment and management of vascular access device: Secondary | ICD-10-CM | POA: Diagnosis not present

## 2024-02-08 DIAGNOSIS — K56609 Unspecified intestinal obstruction, unspecified as to partial versus complete obstruction: Secondary | ICD-10-CM | POA: Diagnosis not present

## 2024-02-08 LAB — BASIC METABOLIC PANEL
Anion gap: 10 (ref 5–15)
BUN: 21 mg/dL (ref 8–23)
CO2: 24 mmol/L (ref 22–32)
Calcium: 8.5 mg/dL — ABNORMAL LOW (ref 8.9–10.3)
Chloride: 104 mmol/L (ref 98–111)
Creatinine, Ser: 0.95 mg/dL (ref 0.44–1.00)
GFR, Estimated: 58 mL/min — ABNORMAL LOW (ref >60)
Glucose, Bld: 102 mg/dL — ABNORMAL HIGH (ref 70–99)
Potassium: 3.5 mmol/L (ref 3.5–5.1)
Sodium: 138 mmol/L (ref 135–145)

## 2024-02-08 LAB — CBC
HCT: 33.8 % — ABNORMAL LOW (ref 36.0–46.0)
Hemoglobin: 10.8 g/dL — ABNORMAL LOW (ref 12.0–15.0)
MCH: 30.5 pg (ref 26.0–34.0)
MCHC: 32 g/dL (ref 30.0–36.0)
MCV: 95.5 fL (ref 80.0–100.0)
Platelets: 288 10*3/uL (ref 150–400)
RBC: 3.54 MIL/uL — ABNORMAL LOW (ref 3.87–5.11)
RDW: 11.9 % (ref 11.5–15.5)
WBC: 7.9 10*3/uL (ref 4.0–10.5)
nRBC: 0 % (ref 0.0–0.2)

## 2024-02-08 LAB — MAGNESIUM: Magnesium: 1.8 mg/dL (ref 1.7–2.4)

## 2024-02-08 LAB — SURGICAL PATHOLOGY

## 2024-02-08 LAB — PHOSPHORUS: Phosphorus: 2.9 mg/dL (ref 2.5–4.6)

## 2024-02-08 MED ORDER — DEXTROSE-SODIUM CHLORIDE 5-0.45 % IV SOLN: INTRAVENOUS | Status: DC

## 2024-02-08 MED ORDER — SODIUM CHLORIDE 0.9% FLUSH
10.0000 mL | Freq: Two times a day (BID) | INTRAVENOUS | Status: DC
  Administered 2024-02-08 – 2024-02-22 (×22): 10 mL
  Administered 2024-02-23: 20 mL
  Administered 2024-02-24 – 2024-03-01 (×12): 10 mL

## 2024-02-08 MED ORDER — DEXTROSE-SODIUM CHLORIDE 5-0.45 % IV SOLN: INTRAVENOUS | Status: AC

## 2024-02-08 MED ORDER — INSULIN ASPART 100 UNIT/ML IJ SOLN
0.0000 [IU] | Freq: Three times a day (TID) | INTRAMUSCULAR | Status: DC
  Administered 2024-02-10 – 2024-02-11 (×4): 1 [IU] via SUBCUTANEOUS
  Administered 2024-02-11: 2 [IU] via SUBCUTANEOUS
  Administered 2024-02-12 – 2024-02-13 (×2): 1 [IU] via SUBCUTANEOUS
  Administered 2024-02-14: 2 [IU] via SUBCUTANEOUS
  Administered 2024-02-14: 1 [IU] via SUBCUTANEOUS

## 2024-02-08 MED ORDER — POTASSIUM CHLORIDE 10 MEQ/100ML IV SOLN
10.0000 meq | INTRAVENOUS | Status: AC
  Administered 2024-02-08 (×3): 10 meq via INTRAVENOUS
  Filled 2024-02-08 (×2): qty 100

## 2024-02-08 MED ORDER — TRAVASOL 10 % IV SOLN
INTRAVENOUS | Status: AC
  Filled 2024-02-08: qty 396

## 2024-02-08 MED ORDER — SODIUM CHLORIDE 0.9% FLUSH: 10.0000 mL | INTRAVENOUS | Status: DC | PRN

## 2024-02-08 MED ORDER — CHLORHEXIDINE GLUCONATE CLOTH 2 % EX PADS
6.0000 | MEDICATED_PAD | Freq: Every day | CUTANEOUS | Status: DC
  Administered 2024-02-08 – 2024-03-01 (×22): 6 via TOPICAL

## 2024-02-08 MED ORDER — POTASSIUM CHLORIDE 10 MEQ/100ML IV SOLN
10.0000 meq | INTRAVENOUS | Status: AC
Start: 2024-02-08 — End: 2024-02-08

## 2024-02-08 NOTE — Progress Notes (Signed)
PROGRESS NOTE    Tracy Preston  ZOX:[id number] DOB: 11-23-1935 DOA: 02/04/2024 PCP: Marden Noble, MD (Inactive)    Brief Narrative:  89 year old female with a history of hypertension, chronic kidney disease, family history of breast cancer who presents with abdominal distention and vomiting for approximately 2 weeks presenting to [facility] emergency department by her outpatient provider after CT scan of the abdomen is performed and is found to have findings concerning for new metastatic gynecological malignancy and small bowel obstruction.  Workup in the hospital showed concerning new onset peritoneal carcinomatosis likely GYN malignancy. General surgery and GYN oncology following.   Assessment & Plan:  Principal Problem:   SBO (small bowel obstruction) (HCC) Active Problems:   GERD (gastroesophageal reflux disease)   Essential hypertension   Peritoneal carcinomatosis (HCC)   Anemia   SBO (small bowel obstruction) (HCC) Concerning this is secondary to malignancy.  Still remains to have very poor oral intake.  Will place PICC line and for TPN starting today at family's request which I agree with.  Hopefully this is short-term.  Poor nutritional status - Discussed with patient and family members are present at bedside.  Will place PICC line and start TPN in the meantime.    Peritoneal carcinomatosis (HCC) CT abdomen pelvis showing peritoneal carcinomatosis, masslike soft tissue in the ureter/cervix.  GYN oncology performed endometrial biopsy on 2/12.  CA125 is elevated.  Awaiting final pathology results for definitive diagnosis. -Status post paracentesis 2/8, pathology results are pending  Essential hypertension -Losartan.  IV as needed  GERD (gastroesophageal reflux disease) PPI  Anemia -Anemia of chronic disease.   DVT prophylaxis: enoxaparin (LOVENOX) injection 40 mg Start: 02/05/24 1000    Code Status: Do not attempt resuscitation (DNR) PRE-ARREST  INTERVENTIONS DESIRED Family Communication: Family members are present at bedside Status is: Inpatient Remains inpatient appropriate because: Continue hospital stay for SBO management    Subjective: Patient seen and examined at bedside.  Multiple family members are also present at bedside.  Patient does not necessarily have any complaints that she is feeling little better.  Does have very poor oral intake which family is reasonably concerned about.  Family and patient requesting PICC line for TPN for at least short-term.  They understand risks and side effects.   Examination:  General exam: Appears calm and comfortable  Respiratory system: Clear to auscultation. Respiratory effort normal. Cardiovascular system: S1 & S2 heard, RRR. No JVD, murmurs, rubs, gallops or clicks. No pedal edema. Gastrointestinal system: Abdomen is nondistended, soft and nontender. No organomegaly or masses felt. Normal bowel sounds heard. Central nervous system: Alert and oriented. No focal neurological deficits. Extremities: Symmetric 5 x 5 power. Skin: No rashes, lesions or ulcers Psychiatry: Judgement and insight appear normal. Mood & affect appropriate.                Diet Orders (From admission, onward)     Start     Ordered   02/04/24 2245  Diet NPO time specified  Diet effective now        02/04/24 2244            Objective: Vitals:   02/07/24 0613 02/07/24 1344 02/07/24 2119 02/08/24 0511  BP: 138/78 130/60 (!) 131/55 139/63  Pulse: 66 75 70 72  Resp: 18 18 18 18   Temp: 98.7 F (37.1 C) 98.4 F (36.9 C) 98.9 F (37.2 C) 98.9 F (37.2 C)  TempSrc: Oral Oral Oral Oral  SpO2: 97% 93% 93% 95%  Intake/Output Summary (Last 24 hours) at 02/08/2024 1231 Last data filed at 02/08/2024 1000 Gross per 24 hour  Intake 0 ml  Output 1875 ml  Net -1875 ml   There were no vitals filed for this visit.  Scheduled Meds:  enoxaparin (LOVENOX) injection  40 mg Subcutaneous Q24H    [START ON 02/09/2024] insulin aspart  0-9 Units Subcutaneous Q8H   losartan  100 mg Per Tube q morning   pantoprazole (PROTONIX) IV  40 mg Intravenous Q12H   Continuous Infusions:  dextrose 5 % and 0.45 % NaCl     TPN ADULT (ION)      Nutritional status     There is no height or weight on file to calculate BMI.  Data Reviewed:   CBC: Recent Labs  Lab 02/05/24 0003 02/05/24 1612 02/06/24 0526 02/07/24 0449 02/08/24 0519  WBC 8.7 8.9 6.6 7.2 7.9  NEUTROABS  --   --  4.7 5.2  --   HGB 12.0 10.7* 9.9* 10.8* 10.8*  HCT 36.1 33.3* 31.6* 34.3* 33.8*  MCV 94.8 95.4 96.6 96.9 95.5  PLT 290 286 262 288 288   Basic Metabolic Panel: Recent Labs  Lab 02/04/24 1619 02/04/24 2316 02/05/24 0003 02/06/24 0526 02/07/24 0449 02/08/24 0519  NA 137  --  136 142 141 138  K 4.4  --  4.0 3.8 3.7 3.5  CL 98  --  98 105 106 104  CO2 24  --  24 31 26 24   GLUCOSE 117*  --  125* 115* 86 102*  BUN 35*  --  33* 23 18 21   CREATININE 1.27* 1.34* 1.36* 1.08* 1.06* 0.95  CALCIUM 10.1  --  9.8 8.9 8.6* 8.5*  MG  --   --   --  1.9  --  1.8  PHOS  --   --   --   --   --  2.9   GFR: CrCl cannot be calculated (Unknown ideal weight.). Liver Function Tests: Recent Labs  Lab 02/04/24 1619 02/05/24 0003 02/06/24 0526 02/07/24 0449  AST 22 19 15 16   ALT 23 19 15 13   ALKPHOS 89 82 62 69  BILITOT 0.9 1.1 0.5 0.5  PROT 7.3 6.7 5.3* 5.4*  ALBUMIN 3.6 3.2* 2.4* 2.5*   Recent Labs  Lab 02/04/24 1619  LIPASE 29   No results for input(s): "AMMONIA" in the last 168 hours. Coagulation Profile: No results for input(s): "INR", "PROTIME" in the last 168 hours. Cardiac Enzymes: No results for input(s): "CKTOTAL", "CKMB", "CKMBINDEX", "TROPONINI" in the last 168 hours. BNP (last 3 results) No results for input(s): "PROBNP" in the last 8760 hours. HbA1C: No results for input(s): "HGBA1C" in the last 72 hours. CBG: No results for input(s): "GLUCAP" in the last 168 hours. Lipid Profile: No  results for input(s): "CHOL", "HDL", "LDLCALC", "TRIG", "CHOLHDL", "LDLDIRECT" in the last 72 hours. Thyroid Function Tests: No results for input(s): "TSH", "T4TOTAL", "FREET4", "T3FREE", "THYROIDAB" in the last 72 hours. Anemia Panel: Recent Labs    02/06/24 0856  VITAMINB12 419  FOLATE 8.9  FERRITIN 205  TIBC 230*  IRON 28   Sepsis Labs: No results for input(s): "PROCALCITON", "LATICACIDVEN" in the last 168 hours.  Recent Results (from the past 240 hours)  Body fluid culture w Gram Stain     Status: None (Preliminary result)   Collection Time: 02/05/24  2:14 PM   Specimen: PATH Cytology Pleural fluid  Result Value Ref Range Status   Specimen Description   Final  PLEURAL Performed at [facility], [address] [place], [place] [address]    Special Requests   Final    NONE Performed at [facility], [address], [place], [place] [address]    Gram Stain WBC SEEN NO ORGANISMS SEEN CYTOSPIN SMEAR   Final   Culture   Final    NO GROWTH 2 DAYS Performed at [facility], [address] [place], [place] [address]    Report Status PENDING  Incomplete         Radiology Studies: [place] EKG SITE RITE Result Date: 02/08/2024 If Site Rite image not attached, placement could not be confirmed due to current cardiac rhythm.  DG Abd Portable 1V Result Date: 02/08/2024 CLINICAL DATA:  Check gastric catheter placement EXAM: PORTABLE ABDOMEN - 1 VIEW COMPARISON:  02/04/2024 FINDINGS: Scattered large and small bowel gas is noted. Mild small bowel dilatation is noted significantly improved when compared with the prior exam. Gastric catheter is noted with the tip in the stomach. No free air is noted. IMPRESSION: Persistent but significantly improved small bowel dilatation when compared with the prior exam. Electronically Signed   By: Alcide Clever M.D.   On: 02/08/2024 09:34           LOS: 4 days   Time spent= 35 mins    Miguel Rota, MD [facility]  If 7PM-7AM, please contact night-coverage  02/08/2024, 12:31 PM

## 2024-02-08 NOTE — Plan of Care (Signed)
Problem: Coping: Goal: Level of anxiety will decrease Outcome: Progressing   Problem: Elimination: Goal: Will not experience complications related to bowel motility Outcome: Progressing

## 2024-02-08 NOTE — Progress Notes (Signed)
PHARMACY - TOTAL PARENTERAL NUTRITION CONSULT NOTE   Indication: Prolonged ileus   Assessment: 89 year old female with SBO likely in setting of malignancy. CT abd/pelvis with peritoneal carcinomatosis, masslike soft tissue in the ureter/cervix. S/p endometrial biopsy with GYN onc, pathology pending for definitive diagnosis. Pharmacy consulted for TPN management.  Glucose / Insulin: no hx DM Electrolytes: WNL, including corrCa Renal: ok Hepatic: alb 2.5, LFTs ok Intake / Output; MIVF:  -UOP 725 ml documented yesterday -NG 300 ml yesterday -MIVF D5 1/2NS at 75 ml/hr ordered (no IV access currently) GI Imaging: 2/11 Abd xray: persistent but significantly improved small bowel dilatation when compared with prior GI Surgeries / Procedures: NA  Central access: pending TPN start date: 2/11 pending PICC  Nutritional Goals: Goal TPN rate is approximately 60 mL/hr (provides 79 g of protein and 1488 kcals per day) - will adjust based on RD calculated needs  RD Assessment: pending    Current Nutrition: NPO  Plan:  -KCl 10 mEq IV x 3 ordered - no IV access currently -Start TPN at 30 mL/hr at 1800 once PICC placed -Electrolytes in TPN: Na 50 mEq/L, K 50 mEq/L, Ca 5 mEq/L, Mg 5 mEq/L, and Phos 15 mmol/L. Cl:Ac 1:1 -Add standard MVI and trace elements to TPN -Initiate Sensitive q8h SSI and adjust as needed  -Reduce MIVF to 40 mL/hr at 1800 -Monitor TPN labs on Mon/Thurs, daily with initiation to monitor for refeeding syndrome  Pricilla Riffle, PharmD, BCPS Clinical Pharmacist 02/08/2024 1:24 PM

## 2024-02-08 NOTE — Progress Notes (Signed)
Mobility Specialist - Progress Note   02/08/24 0853  Mobility  Activity Ambulated with assistance in hallway  Level of Assistance Modified independent, requires aide device or extra time  Assistive Device Front wheel walker  Distance Ambulated (ft) 500 ft  Activity Response Tolerated well  Mobility Referral Yes  Mobility visit 1 Mobility  Mobility Specialist Start Time (ACUTE ONLY) [id number]  Mobility Specialist Stop Time (ACUTE ONLY) [id number]  Mobility Specialist Time Calculation (min) (ACUTE ONLY) 15 min   Pt received in bed and agreeable to mobility. No complaints during session. Pt to bed after session with all needs met.    [facility]

## 2024-02-08 NOTE — Progress Notes (Addendum)
Subjective: CC: Her abdomen continues to feel bloated but less tight today. She feels like things are moving in her lower abdomen like she is going to have a bm. Passing a small amount of flatus. No BM. No nausea. NGT w/ 950cc/24 hours. Mobilizing. Xray pending.   Objective: Vital signs in last 24 hours: Temp:  [98.4 F (36.9 C)-98.9 F (37.2 C)] 98.9 F (37.2 C) (02/11 0511) Pulse Rate:  [70-75] 72 (02/11 0511) Resp:  [18] 18 (02/11 0511) BP: (130-139)/(55-63) 139/63 (02/11 0511) SpO2:  [93 %-95 %] 95 % (02/11 0511) Last BM Date : 02/04/24  Intake/Output from previous day: 02/10 0701 - 02/11 0700 In: 0  Out: 1875 [Urine:925; Emesis/NG output:950] Intake/Output this shift: No intake/output data recorded.  PE: Gen:  Alert, NAD, pleasant Abd: Soft, mild to mod distension, NT, some BS. NGT in place on LIWS.   Lab Results:  Recent Labs    02/07/24 0449 02/08/24 0519  WBC 7.2 7.9  HGB 10.8* 10.8*  HCT 34.3* 33.8*  PLT 288 288   BMET Recent Labs    02/07/24 0449 02/08/24 0519  NA 141 138  K 3.7 3.5  CL 106 104  CO2 26 24  GLUCOSE 86 102*  BUN 18 21  CREATININE 1.06* 0.95  CALCIUM 8.6* 8.5*   PT/INR No results for input(s): "LABPROT", "INR" in the last 72 hours. CMP     Component Value Date/Time   NA 138 02/08/2024 0519   K 3.5 02/08/2024 0519   CL 104 02/08/2024 0519   CO2 24 02/08/2024 0519   GLUCOSE 102 (H) 02/08/2024 0519   BUN 21 02/08/2024 0519   CREATININE 0.95 02/08/2024 0519   CALCIUM 8.5 (L) 02/08/2024 0519   PROT 5.4 (L) 02/07/2024 0449   ALBUMIN 2.5 (L) 02/07/2024 0449   AST 16 02/07/2024 0449   ALT 13 02/07/2024 0449   ALKPHOS 69 02/07/2024 0449   BILITOT 0.5 02/07/2024 0449   GFRNONAA 58 (L) 02/08/2024 0519   Lipase     Component Value Date/Time   LIPASE 29 02/04/2024 1619    Studies/Results: No results found.   Anti-infectives: Anti-infectives (From admission, onward)    None        Assessment/Plan pSBO -  pSBO Suspected secondary to peritoneal carcinomatosis with evidence of suspected malignant ascites, origin has not been definitively established yet. CT w/ masslike soft tissue prominence in the region of the lower uterine segment and cervix, Liver metastases and Mild retroperitoneal lymphadenopathy. CA -125 noted to be elevated at 584  - S/p Paracentesis 02/05/24-100 cc of yellow fluid sent for cytology.  Noted high cell count at 2850. Cytology pending.  - GYN ONC following. S/p pelvic exam and endometrial bx. Path pending.  - Would also recommend engagement with medical oncology.  Regarding her apparent suspected partial small bowel obstruction, continue NG tube to low intermittent wall suction; NPO; IV fluid per primary. Okay to clamp ngt for mobilization. May need to consider alternative nutrition in near future.     FEN - NPO, NGT to LIWS, IVF per primary  VTE - SCDs, Lovenox ID - None  I reviewed nursing notes, last 24 h vitals and pain scores, last 48 h intake and output, last 24 h labs and trends, and last 24 h imaging results.   LOS: 4 days    Jacinto Halim , [facility] 02/08/2024, 8:37 AM Please see Amion for pager number during day hours 7:00am-4:30pm

## 2024-02-08 NOTE — Progress Notes (Signed)
Peripherally Inserted Central Catheter Placement  The IV Nurse has discussed with the patient and/or persons authorized to consent for the patient, the purpose of this procedure and the potential benefits and risks involved with this procedure.  The benefits include less needle sticks, lab draws from the catheter, and the patient may be discharged home with the catheter. Risks include, but not limited to, infection, bleeding, blood clot (thrombus formation), and puncture of an artery; nerve damage and irregular heartbeat and possibility to perform a PICC exchange if needed/ordered by physician.  Alternatives to this procedure were also discussed.  Bard Power PICC patient education guide, fact sheet on infection prevention and patient information card has been provided to patient /or left at bedside.    PICC Placement Documentation  PICC Double Lumen 02/08/24 Right Brachial 37 cm 0 cm (Active)  Indication for Insertion or Continuance of Line Administration of hyperosmolar/irritating solutions (i.e. TPN, Vancomycin, etc.) 02/08/24 1616  Exposed Catheter (cm) 0 cm 02/08/24 1616  Site Assessment Clean, Dry, Intact 02/08/24 1616  Lumen #1 Status Flushed;Saline locked;Blood return noted 02/08/24 1616  Lumen #2 Status Flushed;Saline locked;Blood return noted 02/08/24 1616  Dressing Type Transparent;Securing device 02/08/24 1616  Dressing Status Antimicrobial disc/dressing in place;Clean, Dry, Intact 02/08/24 1616  Line Care Connections checked and tightened 02/08/24 1616  Line Adjustment (NICU/IV Team Only) No 02/08/24 1616  Dressing Intervention New dressing;Adhesive placed at insertion site (IV team only) 02/08/24 1616  Dressing Change Due 02/15/24 02/08/24 1616       Audrie Gallus 02/08/2024, 4:19 PM

## 2024-02-08 NOTE — Progress Notes (Signed)
GYN Oncology Progress Note  Patient is alert, oriented, in no acute distress, resting in bed with 2 sons at the bedside.  She reports doing well at this time.  No nausea or emesis reported. No BM or flatus. NG tube remains to low intermittent wall suction with currently around 100 cc in the canister.  Advised we are waiting on the results of the paracentesis cytology along with the endometrial biopsy performed in the office yesterday.  Advised she would be notified when these results return. TPN and PICC line placement ordered per Hospitalist team. Continue current plan of care.

## 2024-02-08 NOTE — Progress Notes (Addendum)
Initial Nutrition Assessment  DOCUMENTATION CODES:   Non-severe (moderate) malnutrition in context of acute illness/injury  INTERVENTION:  TPN pharmacy to monitor and adjust.  Advance diet as medically applicable   NUTRITION DIAGNOSIS:   Moderate Malnutrition related to acute illness as evidenced by severe fat depletion, moderate fat depletion.    GOAL:   Patient will meet greater than or equal to 90% of their needs    MONITOR:   Diet advancement, Weight trends, Labs  REASON FOR ASSESSMENT:   Consult New TPN/TNA  ASSESSMENT:   89 y.o. F, Presented to ED with complaints of abdominal distention and vomiting for the last 2 weeks. CT showed concerning findings and she was sent to ED for further evaluation. PMH; HTN, CKD. Patient admitted related to small bowel obstruction secondary to intra-abdominal masses of peritoneal carcinomatosis. Review of EMR revealed office visit weight of 116.4# (02/03/2024) Patient alert and orientated.  Friends in room with her. Stated that it was fine to discuss medical concerns in front of her friends. Patient reports that she had good appetite physically fit prior to getting sick. Reports not having much to eat in the last 2 weeks, symptoms started out as burning in stomach, advancing to nausea and vomiting. Last thing she can remember consuming was last Tuesday crackers and tea.  NG suctioning with fluid coming coming out and patient stated that she was starting to feel better.  She said that her normal weight is around  121-122# .  Reported that she weighed at dr. Isidore Moos and it was 119#.  NFPE; Revealed severe to moderate muscle and fat depletion.  Suspect some this depletion is age related and not nutrition related, although due to poor oral intake over the last 2 weeks patient meets criteria for non severe malnutrition verses severe.  Pharmacy plan:  -KCl 10 mEq IV x 3 ordered - no IV access currently -Start TPN at 30 mL/hr at 1800 once PICC  placed -Electrolytes in TPN: Na 50 mEq/L, K 50 mEq/L, Ca 5 mEq/L, Mg 5 mEq/L, and Phos 15 mmol/L. Cl:Ac 1:1 -Add standard MVI and trace elements to TPN -Initiate Sensitive q8h SSI and adjust as needed  -Reduce MIVF to 40 mL/hr at 1800 -Monitor TPN labs on Mon/Thurs, daily with initiation to monitor for refeeding syndrome  2/7- NG vented/dual lumen 16 Fr. Intermittent suction.NPO 2/11- TPN Admit weight: 54.4 kg 54.4 kg   Average Meal Intake: NPO  Nutritionally Relevant Medications:  Continuous Infusions:  dextrose 5 % and 0.45 % NaCl     TPN ADULT (ION)      Labs Reviewed    NUTRITION - FOCUSED PHYSICAL EXAM:  Flowsheet Row Most Recent Value  Orbital Region Moderate depletion  Upper Arm Region Moderate depletion  Thoracic and Lumbar Region Moderate depletion  Buccal Region Mild depletion  Temple Region Mild depletion  Clavicle Bone Region Severe depletion  Clavicle and Acromion Bone Region Severe depletion  Scapular Bone Region Severe depletion  Dorsal Hand Severe depletion  Patellar Region Mild depletion  Anterior Thigh Region Mild depletion  Posterior Calf Region Mild depletion  Edema (RD Assessment) None  Hair Reviewed  Eyes Reviewed  Mouth Reviewed  Skin Reviewed  Nails Reviewed       Diet Order:   Diet Order             Diet NPO time specified  Diet effective now                   EDUCATION  NEEDS:   Education needs have been addressed  Skin:  Skin Assessment: Reviewed RN Assessment  Last BM:  PTA  Height:   Ht Readings from Last 1 Encounters:  08/06/15 5' 4.5" (1.638 m)    Weight:   Wt Readings from Last 1 Encounters:  08/06/15 54.4 kg    Ideal Body Weight:     BMI:  There is no height or weight on file to calculate BMI.  Estimated Nutritional Needs:   Kcal:  1625-1900 kcal  Protein:  70-90 g  Fluid:  62ml/kcal    Jamelle Haring RDN, LDN Clinical Dietitian   If unable to reach, please contact "RD Inpatient" secure  chat group between 8 am-4 pm daily"

## 2024-02-09 DIAGNOSIS — K56609 Unspecified intestinal obstruction, unspecified as to partial versus complete obstruction: Secondary | ICD-10-CM | POA: Diagnosis not present

## 2024-02-09 LAB — COMPREHENSIVE METABOLIC PANEL
ALT: 20 U/L (ref 0–44)
AST: 28 U/L (ref 15–41)
Albumin: 2.5 g/dL — ABNORMAL LOW (ref 3.5–5.0)
Alkaline Phosphatase: 75 U/L (ref 38–126)
Anion gap: 8 (ref 5–15)
BUN: 21 mg/dL (ref 8–23)
CO2: 26 mmol/L (ref 22–32)
Calcium: 8.3 mg/dL — ABNORMAL LOW (ref 8.9–10.3)
Chloride: 107 mmol/L (ref 98–111)
Creatinine, Ser: 0.92 mg/dL (ref 0.44–1.00)
GFR, Estimated: >60 mL/min (ref >60)
Glucose, Bld: 118 mg/dL — ABNORMAL HIGH (ref 70–99)
Potassium: 3.8 mmol/L (ref 3.5–5.1)
Sodium: 141 mmol/L (ref 135–145)
Total Bilirubin: 0.4 mg/dL (ref 0.0–1.2)
Total Protein: 5.6 g/dL — ABNORMAL LOW (ref 6.5–8.1)

## 2024-02-09 LAB — CBC
HCT: 33.5 % — ABNORMAL LOW (ref 36.0–46.0)
Hemoglobin: 10.5 g/dL — ABNORMAL LOW (ref 12.0–15.0)
MCH: 30.9 pg (ref 26.0–34.0)
MCHC: 31.3 g/dL (ref 30.0–36.0)
MCV: 98.5 fL (ref 80.0–100.0)
Platelets: 305 10*3/uL (ref 150–400)
RBC: 3.4 MIL/uL — ABNORMAL LOW (ref 3.87–5.11)
RDW: 11.9 % (ref 11.5–15.5)
WBC: 8 10*3/uL (ref 4.0–10.5)
nRBC: 0 % (ref 0.0–0.2)

## 2024-02-09 LAB — GLUCOSE, CAPILLARY
Glucose-Capillary: 110 mg/dL — ABNORMAL HIGH (ref 70–99)
Glucose-Capillary: 134 mg/dL — ABNORMAL HIGH (ref 70–99)
Glucose-Capillary: 128 mg/dL — ABNORMAL HIGH (ref 70–99)

## 2024-02-09 LAB — BODY FLUID CULTURE W GRAM STAIN

## 2024-02-09 LAB — MAGNESIUM: Magnesium: 2 mg/dL (ref 1.7–2.4)

## 2024-02-09 LAB — PHOSPHORUS: Phosphorus: 2.3 mg/dL — ABNORMAL LOW (ref 2.5–4.6)

## 2024-02-09 MED ORDER — TRAVASOL 10 % IV SOLN
INTRAVENOUS | Status: AC
  Filled 2024-02-09: qty 792

## 2024-02-09 MED ORDER — MAGIC MOUTHWASH W/LIDOCAINE: 5.0000 mL | Freq: Three times a day (TID) | ORAL | Status: DC | PRN

## 2024-02-09 MED ORDER — POTASSIUM PHOSPHATES 15 MMOLE/5ML IV SOLN
15.0000 mmol | Freq: Once | INTRAVENOUS | Status: AC
  Administered 2024-02-09: 15 mmol via INTRAVENOUS
  Filled 2024-02-09: qty 5

## 2024-02-09 MED ORDER — MENTHOL 3 MG MT LOZG
1.0000 | LOZENGE | OROMUCOSAL | Status: DC | PRN
  Filled 2024-02-09: qty 9

## 2024-02-09 NOTE — Progress Notes (Signed)
GYN Oncology Progress Note  Patient seen by myself and Dr. Pricilla Holm. Pt passed a small amount of flatus earlier today. Feels less full than when she came in but still distended.    Pathology is pending from paracentesis but preliminary results are leaning towards GYN malignancy. Immunostains should be available tomorrow. Recommend CT chest imaging to rule out metastatic disease in the chest. Recommend port a cath placement. Dr. Truett Perna with Medical Oncology to see patient tomorrow. Dr. Pricilla Holm recommends first cycle of chemotherapy while inpatient with continued bowel management, TPN until return of bowel function.

## 2024-02-09 NOTE — Progress Notes (Signed)
Mobility Specialist - Progress Note   02/09/24 0849  Mobility  Activity Ambulated with assistance in hallway;Ambulated with assistance to bathroom  Level of Assistance Modified independent, requires aide device or extra time  Assistive Device Front wheel walker  Distance Ambulated (ft) 500 ft  Activity Response Tolerated well  Mobility Referral Yes  Mobility visit 1 Mobility  Mobility Specialist Start Time (ACUTE ONLY) 0831  Mobility Specialist Stop Time (ACUTE ONLY) 0848  Mobility Specialist Time Calculation (min) (ACUTE ONLY) 17 min   Pt received in bed and agreeable to mobility. No complaints during session. Upon returning to room, pt requested assistance to the bathroom. Pt to recliner after session with all needs met.    [facility]

## 2024-02-09 NOTE — Progress Notes (Signed)
PROGRESS NOTE    Tracy Preston  VHQ:[id number] DOB: 1935-02-13 DOA: 02/04/2024 PCP: Marden Noble, MD (Inactive)    Brief Narrative:  89 year old female with a history of hypertension, chronic kidney disease, family history of breast cancer who presents with abdominal distention and vomiting for approximately 2 weeks presenting to [facility] emergency department by her outpatient provider after CT scan of the abdomen is performed and is found to have findings concerning for new metastatic gynecological malignancy and small bowel obstruction.  Workup in the hospital showed concerning new onset peritoneal carcinomatosis likely GYN malignancy. General surgery and GYN oncology following.   Assessment & Plan:  Principal Problem:   SBO (small bowel obstruction) (HCC) Active Problems:   GERD (gastroesophageal reflux disease)   Essential hypertension   Peritoneal carcinomatosis (HCC)   Anemia   SBO (small bowel obstruction) (HCC) Concerning this is secondary to malignancy.  Still remains to have very poor oral intake.  PICC line placed 2/11, on TPN  Poor nutritional status - Discussed with patient and family members are present at bedside.  Getting nutrition via TPN    Peritoneal carcinomatosis (HCC) CT abdomen pelvis showing peritoneal carcinomatosis, masslike soft tissue in the ureter/cervix.  GYN oncology performed endometrial biopsy on 2/12 which is negative for malignancy.  CA125 is elevated.  Non-GYN cytology from paracentesis is pending. -Status post paracentesis 2/8, pathology results are pending  Essential hypertension -Losartan.  IV as needed  GERD (gastroesophageal reflux disease) PPI  Anemia -Anemia of chronic disease.   DVT prophylaxis: enoxaparin (LOVENOX) injection 40 mg Start: 02/05/24 1000    Code Status: Do not attempt resuscitation (DNR) PRE-ARREST INTERVENTIONS DESIRED Family Communication: Family members are present at bedside Status is:  Inpatient Remains inpatient appropriate because: Continue hospital stay for SBO management    Subjective:  Sitting up in the chair. Feels better.  Some sore throat with NGT as expected.  Tolerating TPN  Examination:  General exam: Appears calm and comfortable ; NGT in place.  Respiratory system: Clear to auscultation. Respiratory effort normal. Cardiovascular system: S1 & S2 heard, RRR. No JVD, murmurs, rubs, gallops or clicks. No pedal edema. Gastrointestinal system: Abdomen is nondistended, soft and nontender. No organomegaly or masses felt. Normal bowel sounds heard. Central nervous system: Alert and oriented. No focal neurological deficits. Extremities: Symmetric 5 x 5 power. Skin: No rashes, lesions or ulcers Psychiatry: Judgement and insight appear normal. Mood & affect appropriate.                Diet Orders (From admission, onward)     Start     Ordered   02/04/24 2245  Diet NPO time specified  Diet effective now        02/04/24 2244            Objective: Vitals:   02/08/24 0511 02/08/24 1355 02/08/24 2047 02/09/24 0643  BP: 139/63 (!) 115/100 (!) 141/57 126/62  Pulse: 72 74 63 65  Resp: 18 18 18 18   Temp: 98.9 F (37.2 C) 99.1 F (37.3 C) 98.7 F (37.1 C) 98.1 F (36.7 C)  TempSrc: Oral Oral Oral Oral  SpO2: 95% 92% 95% 97%    Intake/Output Summary (Last 24 hours) at 02/09/2024 1127 Last data filed at 02/09/2024 4132 Gross per 24 hour  Intake 772.69 ml  Output 2150 ml  Net -1377.31 ml   There were no vitals filed for this visit.  Scheduled Meds:  Chlorhexidine Gluconate Cloth  6 each Topical Daily  enoxaparin (LOVENOX) injection  40 mg Subcutaneous Q24H   insulin aspart  0-9 Units Subcutaneous Q8H   losartan  100 mg Per Tube q morning   pantoprazole (PROTONIX) IV  40 mg Intravenous Q12H   sodium chloride flush  10-40 mL Intracatheter Q12H   Continuous Infusions:  dextrose 5 % and 0.45 % NaCl 40 mL/hr at 02/08/24 1823   TPN ADULT  (ION) 30 mL/hr at 02/08/24 1610    Nutritional status Signs/Symptoms: severe fat depletion, moderate fat depletion Interventions: TPN There is no height or weight on file to calculate BMI.  Data Reviewed:   CBC: Recent Labs  Lab 02/05/24 1612 02/06/24 0526 02/07/24 0449 02/08/24 0519 02/09/24 0419  WBC 8.9 6.6 7.2 7.9 8.0  NEUTROABS  --  4.7 5.2  --   --   HGB 10.7* 9.9* 10.8* 10.8* 10.5*  HCT 33.3* 31.6* 34.3* 33.8* 33.5*  MCV 95.4 96.6 96.9 95.5 98.5  PLT 286 262 288 288 305   Basic Metabolic Panel: Recent Labs  Lab 02/05/24 0003 02/06/24 0526 02/07/24 0449 02/08/24 0519 02/09/24 0419  NA 136 142 141 138 141  K 4.0 3.8 3.7 3.5 3.8  CL 98 105 106 104 107  CO2 24 31 26 24 26   GLUCOSE 125* 115* 86 102* 118*  BUN 33* 23 18 21 21   CREATININE 1.36* 1.08* 1.06* 0.95 0.92  CALCIUM 9.8 8.9 8.6* 8.5* 8.3*  MG  --  1.9  --  1.8 2.0  PHOS  --   --   --  2.9 2.3*   GFR: CrCl cannot be calculated (Unknown ideal weight.). Liver Function Tests: Recent Labs  Lab 02/04/24 1619 02/05/24 0003 02/06/24 0526 02/07/24 0449 02/09/24 0419  AST 22 19 15 16 28   ALT 23 19 15 13 20   ALKPHOS 89 82 62 69 75  BILITOT 0.9 1.1 0.5 0.5 0.4  PROT 7.3 6.7 5.3* 5.4* 5.6*  ALBUMIN 3.6 3.2* 2.4* 2.5* 2.5*   Recent Labs  Lab 02/04/24 1619  LIPASE 29   No results for input(s): "AMMONIA" in the last 168 hours. Coagulation Profile: No results for input(s): "INR", "PROTIME" in the last 168 hours. Cardiac Enzymes: No results for input(s): "CKTOTAL", "CKMB", "CKMBINDEX", "TROPONINI" in the last 168 hours. BNP (last 3 results) No results for input(s): "PROBNP" in the last 8760 hours. HbA1C: No results for input(s): "HGBA1C" in the last 72 hours. CBG: Recent Labs  Lab 02/08/24 2345 02/09/24 0805  GLUCAP 110* 134*   Lipid Profile: No results for input(s): "CHOL", "HDL", "LDLCALC", "TRIG", "CHOLHDL", "LDLDIRECT" in the last 72 hours. Thyroid Function Tests: No results for  input(s): "TSH", "T4TOTAL", "FREET4", "T3FREE", "THYROIDAB" in the last 72 hours. Anemia Panel: No results for input(s): "VITAMINB12", "FOLATE", "FERRITIN", "TIBC", "IRON", "RETICCTPCT" in the last 72 hours. Sepsis Labs: No results for input(s): "PROCALCITON", "LATICACIDVEN" in the last 168 hours.  Recent Results (from the past 240 hours)  Body fluid culture w Gram Stain     Status: None (Preliminary result)   Collection Time: 02/05/24  2:14 PM   Specimen: PATH Cytology Pleural fluid  Result Value Ref Range Status   Specimen Description   Final    PLEURAL Performed at [facility], [address] [place], [place] [address]    Special Requests   Final    NONE Performed at [facility], [address] [place], [place] [address]    Gram Stain WBC SEEN NO ORGANISMS SEEN CYTOSPIN SMEAR   Final  Culture   Final    NO GROWTH 3 DAYS Performed at [facility], [address], [place], [place] [address]    Report Status PENDING  Incomplete         Radiology Studies: DG Chest 1 View Result Date: 02/08/2024 CLINICAL DATA:  PICC line placement EXAM: CHEST  1 VIEW COMPARISON:  02/05/2024 FINDINGS: Right PICC line in place with the tip in the SVC. NG tube is in the stomach. Heart and mediastinal contours within normal limits. No confluent opacities or effusions. No acute bony abnormality. IMPRESSION: Right PICC line tip in the SVC. No active cardiopulmonary disease. Electronically Signed   By: Charlett Nose M.D.   On: 02/08/2024 17:51   [place] EKG SITE RITE Result Date: 02/08/2024 If Site Rite image not attached, placement could not be confirmed due to current cardiac rhythm.  DG Abd Portable 1V Result Date: 02/08/2024 CLINICAL DATA:  Check gastric catheter placement EXAM: PORTABLE ABDOMEN - 1 VIEW COMPARISON:  02/04/2024 FINDINGS: Scattered large and small bowel gas is noted. Mild small bowel dilatation is noted significantly improved when  compared with the prior exam. Gastric catheter is noted with the tip in the stomach. No free air is noted. IMPRESSION: Persistent but significantly improved small bowel dilatation when compared with the prior exam. Electronically Signed   By: Alcide Clever M.D.   On: 02/08/2024 09:34           LOS: 5 days   Time spent= 35 mins    Miguel Rota, MD [facility]  If 7PM-7AM, please contact night-coverage  02/09/2024, 11:27 AM

## 2024-02-09 NOTE — Progress Notes (Signed)
PHARMACY - TOTAL PARENTERAL NUTRITION CONSULT NOTE   Indication: Prolonged ileus   Assessment: 89 year old female with SBO likely in setting of malignancy. CT abd/pelvis with peritoneal carcinomatosis, masslike soft tissue in the ureter/cervix. S/p endometrial biopsy by GynOnc, pathology pending for definitive diagnosis. Pharmacy consulted for TPN management.  Glucose / Insulin: no hx DM - CBGs well controlled after starting TPN at 1/2 rate - no SSI used yesterday Electrolytes: Phos slightly low today, otherwise all lytes WNL, including corrCa Renal: ok Hepatic: Alb low but stable; LFTs/Tbili stable WNL - TG pending I/O:  - UOP adequate - NG output increased significantly yesterday to > 1000 ml - MIVF: D5 1/2NS at 40 ml/hr GI Imaging: 2/11 Abd xray: persistent but significantly improved small bowel dilatation when compared with prior GI Surgeries / Procedures: NA  Central access: pending TPN start date: 2/11 pending PICC  Nutritional Goals: Goal TPN rate is approximately 60 mL/hr (provides 79 g of protein and 1488 kcals per day) - will adjust based on RD calculated needs  RD Assessment: pending Estimated Needs Total Energy Estimated Needs: 1625-1900 kcal Total Protein Estimated Needs: 70-90 g Total Fluid Estimated Needs: 68ml/kcal  Current Nutrition: NPO  Plan:  KPhos 15 mmol (provides~22 mEq K) IV x 1 today  Advance TPN with lipids to 60 mL/hr at 1800 Electrolytes in TPN: no changes today with advancing TPN rate Na - 50 mEq/L K - 50 mEq/L Ca - 5 mEq/L Mg - 5 mEq/L Phos - 15 mmol/L Cl:Ac ratio - 1:1 Add standard MVI and trace elements to TPN Continue Sensitive q8h SSI and adjust as needed  Stop MVIF at 1800; further mIVF per MD Monitor TPN labs on Mon/Thurs  Bernadene Person, PharmD, BCPS [phone] 02/09/2024, 12:19 PM

## 2024-02-09 NOTE — Progress Notes (Signed)
Subjective: CC: Still having no symptoms.  Still waiting for a plan to be formed.  In good spirits.  Did pass some gas! No bowel movements.  Objective: Vital signs in last 24 hours: Temp:  [98.1 F (36.7 C)-99.1 F (37.3 C)] 98.1 F (36.7 C) (02/12 0643) Pulse Rate:  [63-74] 65 (02/12 0643) Resp:  [18] 18 (02/12 0643) BP: (115-141)/(57-100) 126/62 (02/12 0643) SpO2:  [92 %-97 %] 97 % (02/12 0643) Last BM Date : 02/04/24  Intake/Output from previous day: 02/11 0701 - 02/12 0700 In: 772.7 [I.V.:670.3; IV Piggyback:102.4] Out: 1950 [Urine:1350; Emesis/NG output:600] Intake/Output this shift: Total I/O In: 0  Out: 400 [Emesis/NG output:400]  PE: Gen:  Alert, NAD, pleasant Abd: Soft, mild to mod distension, NT, some BS. NGT in place on LIWS.   Lab Results:  Recent Labs    02/08/24 0519 02/09/24 0419  WBC 7.9 8.0  HGB 10.8* 10.5*  HCT 33.8* 33.5*  PLT 288 305   BMET Recent Labs    02/08/24 0519 02/09/24 0419  NA 138 141  K 3.5 3.8  CL 104 107  CO2 24 26  GLUCOSE 102* 118*  BUN 21 21  CREATININE 0.95 0.92  CALCIUM 8.5* 8.3*   PT/INR No results for input(s): "LABPROT", "INR" in the last 72 hours. CMP     Component Value Date/Time   NA 141 02/09/2024 0419   K 3.8 02/09/2024 0419   CL 107 02/09/2024 0419   CO2 26 02/09/2024 0419   GLUCOSE 118 (H) 02/09/2024 0419   BUN 21 02/09/2024 0419   CREATININE 0.92 02/09/2024 0419   CALCIUM 8.3 (L) 02/09/2024 0419   PROT 5.6 (L) 02/09/2024 0419   ALBUMIN 2.5 (L) 02/09/2024 0419   AST 28 02/09/2024 0419   ALT 20 02/09/2024 0419   ALKPHOS 75 02/09/2024 0419   BILITOT 0.4 02/09/2024 0419   GFRNONAA >60 02/09/2024 0419   Lipase     Component Value Date/Time   LIPASE 29 02/04/2024 1619    Studies/Results: DG Chest 1 View Result Date: 02/08/2024 CLINICAL DATA:  PICC line placement EXAM: CHEST  1 VIEW COMPARISON:  02/05/2024 FINDINGS: Right PICC line in place with the tip in the SVC. NG tube is in the  stomach. Heart and mediastinal contours within normal limits. No confluent opacities or effusions. No acute bony abnormality. IMPRESSION: Right PICC line tip in the SVC. No active cardiopulmonary disease. Electronically Signed   By: Charlett Nose M.D.   On: 02/08/2024 17:51   [place] EKG SITE RITE Result Date: 02/08/2024 If Site Rite image not attached, placement could not be confirmed due to current cardiac rhythm.  DG Abd Portable 1V Result Date: 02/08/2024 CLINICAL DATA:  Check gastric catheter placement EXAM: PORTABLE ABDOMEN - 1 VIEW COMPARISON:  02/04/2024 FINDINGS: Scattered large and small bowel gas is noted. Mild small bowel dilatation is noted significantly improved when compared with the prior exam. Gastric catheter is noted with the tip in the stomach. No free air is noted. IMPRESSION: Persistent but significantly improved small bowel dilatation when compared with the prior exam. Electronically Signed   By: Alcide Clever M.D.   On: 02/08/2024 09:34     Anti-infectives: Anti-infectives (From admission, onward)    None        Assessment/Plan pSBO - pSBO Suspected secondary to peritoneal carcinomatosis with evidence of suspected malignant ascites, origin has not been definitively established yet. CT w/ masslike soft tissue prominence in the region of the  lower uterine segment and cervix, Liver metastases and Mild retroperitoneal lymphadenopathy. CA -125 noted to be elevated at 584  - S/p Paracentesis 02/05/24-100 cc of yellow fluid sent for cytology.  Noted high cell count at 2850. Cytology pending.  - GYN ONC following. S/p pelvic exam and endometrial bx. Path pending.   Regarding her partial small bowel obstruction, continue NG tube to low intermittent wall suction for now.  Passing some flatus, eventually may be able to remove NG tube and allow liquid diet, may however need continued decompression.  Nutrition plan will be made in conjunction with treatment plan once results reviewed by  GYN/ONC team.  Surgery team available to support as needed.     FEN - NPO, NGT to LIWS, IVF per primary  VTE - SCDs, Lovenox ID - None  I reviewed nursing notes, last 24 h vitals and pain scores, last 48 h intake and output, last 24 h labs and trends, and last 24 h imaging results.   LOS: 5 days    Quentin Ore, MD  [facility] Surgery 02/09/2024, 10:12 AM Please see Amion for pager number during day hours 7:00am-4:30pm

## 2024-02-09 NOTE — Care Management Important Message (Signed)
Important Message  Patient Details IM Letter delivered Name: RILIE GLANZ MRN: [id number] Date of Birth: [birth year]   Important Message Given:  Yes - Medicare IM     Caren Macadam 02/09/2024, 12:34 PM

## 2024-02-10 ENCOUNTER — Inpatient Hospital Stay (HOSPITAL_COMMUNITY): Payer: Medicare PPO

## 2024-02-10 DIAGNOSIS — Z7189 Other specified counseling: Secondary | ICD-10-CM

## 2024-02-10 DIAGNOSIS — R4589 Other symptoms and signs involving emotional state: Secondary | ICD-10-CM

## 2024-02-10 DIAGNOSIS — C786 Secondary malignant neoplasm of retroperitoneum and peritoneum: Secondary | ICD-10-CM | POA: Diagnosis not present

## 2024-02-10 DIAGNOSIS — Z515 Encounter for palliative care: Principal | ICD-10-CM

## 2024-02-10 DIAGNOSIS — R140 Abdominal distension (gaseous): Secondary | ICD-10-CM | POA: Diagnosis not present

## 2024-02-10 DIAGNOSIS — E440 Moderate protein-calorie malnutrition: Secondary | ICD-10-CM | POA: Diagnosis not present

## 2024-02-10 DIAGNOSIS — Z66 Do not resuscitate: Secondary | ICD-10-CM

## 2024-02-10 DIAGNOSIS — K56609 Unspecified intestinal obstruction, unspecified as to partial versus complete obstruction: Secondary | ICD-10-CM | POA: Diagnosis not present

## 2024-02-10 DIAGNOSIS — C787 Secondary malignant neoplasm of liver and intrahepatic bile duct: Secondary | ICD-10-CM | POA: Insufficient documentation

## 2024-02-10 DIAGNOSIS — C801 Malignant (primary) neoplasm, unspecified: Secondary | ICD-10-CM | POA: Insufficient documentation

## 2024-02-10 DIAGNOSIS — Z4682 Encounter for fitting and adjustment of non-vascular catheter: Secondary | ICD-10-CM | POA: Diagnosis not present

## 2024-02-10 LAB — COMPREHENSIVE METABOLIC PANEL
ALT: 24 U/L (ref 0–44)
AST: 31 U/L (ref 15–41)
Albumin: 2.2 g/dL — ABNORMAL LOW (ref 3.5–5.0)
Alkaline Phosphatase: 90 U/L (ref 38–126)
Anion gap: 6 (ref 5–15)
BUN: 24 mg/dL — ABNORMAL HIGH (ref 8–23)
CO2: 25 mmol/L (ref 22–32)
Calcium: 8.2 mg/dL — ABNORMAL LOW (ref 8.9–10.3)
Chloride: 109 mmol/L (ref 98–111)
Creatinine, Ser: 0.87 mg/dL (ref 0.44–1.00)
GFR, Estimated: >60 mL/min (ref >60)
Glucose, Bld: 124 mg/dL — ABNORMAL HIGH (ref 70–99)
Potassium: 3.9 mmol/L (ref 3.5–5.1)
Sodium: 140 mmol/L (ref 135–145)
Total Bilirubin: 0.3 mg/dL (ref 0.0–1.2)
Total Protein: 5.6 g/dL — ABNORMAL LOW (ref 6.5–8.1)

## 2024-02-10 LAB — GLUCOSE, CAPILLARY
Glucose-Capillary: 124 mg/dL — ABNORMAL HIGH (ref 70–99)
Glucose-Capillary: 125 mg/dL — ABNORMAL HIGH (ref 70–99)
Glucose-Capillary: 140 mg/dL — ABNORMAL HIGH (ref 70–99)
Glucose-Capillary: 163 mg/dL — ABNORMAL HIGH (ref 70–99)

## 2024-02-10 LAB — PHOSPHORUS: Phosphorus: 3.3 mg/dL (ref 2.5–4.6)

## 2024-02-10 LAB — CBC
HCT: 32.6 % — ABNORMAL LOW (ref 36.0–46.0)
Hemoglobin: 10.7 g/dL — ABNORMAL LOW (ref 12.0–15.0)
MCH: 31.6 pg (ref 26.0–34.0)
MCHC: 32.8 g/dL (ref 30.0–36.0)
MCV: 96.2 fL (ref 80.0–100.0)
Platelets: 297 10*3/uL (ref 150–400)
RBC: 3.39 MIL/uL — ABNORMAL LOW (ref 3.87–5.11)
RDW: 12.1 % (ref 11.5–15.5)
WBC: 8.3 10*3/uL (ref 4.0–10.5)
nRBC: 0 % (ref 0.0–0.2)

## 2024-02-10 LAB — MAGNESIUM: Magnesium: 1.9 mg/dL (ref 1.7–2.4)

## 2024-02-10 MED ORDER — TRAVASOL 10 % IV SOLN
INTRAVENOUS | Status: AC
Start: 2024-02-10 — End: 2024-02-11
  Filled 2024-02-10: qty 840

## 2024-02-10 MED ORDER — PHENOL 1.4 % MT LIQD
1.0000 | OROMUCOSAL | Status: DC | PRN
  Filled 2024-02-10: qty 177

## 2024-02-10 MED ORDER — OCTREOTIDE ACETATE 100 MCG/ML IJ SOLN
100.0000 ug | Freq: Three times a day (TID) | INTRAMUSCULAR | Status: DC
  Filled 2024-02-10: qty 1

## 2024-02-10 MED ORDER — DEXAMETHASONE SODIUM PHOSPHATE 10 MG/ML IJ SOLN
8.0000 mg | Freq: Every day | INTRAMUSCULAR | Status: DC
  Administered 2024-02-10 – 2024-02-11 (×2): 8 mg via INTRAVENOUS
  Filled 2024-02-10 (×2): qty 1

## 2024-02-10 NOTE — Consult Note (Addendum)
[facility] CONSULT NOTE  Patient Care Team: Marden Noble, MD (Inactive) as PCP - General (Internal Medicine)  CHIEF COMPLAINTS/PURPOSE OF CONSULTATION:  Abdominal distention.  Likely ovarian CA  REFERRING PHYSICIAN: Dr. Nelson Chimes  HISTORY OF PRESENTING ILLNESS:  Tracy Preston 89 y.o. female who relates that she was in her normal state of health, walking 8 miles a day, when around mid January she began to have nausea, heartburn, abdominal distention.  The abdominal distention and heartburn worsened and she began to feel very fatigued.  She did go to see her PCP who diagnosed her with an UTI and also sent her for CT scan.  This showed masses and she was sent to the ER for further evaluation.  Patient came into the ER on 02/04/2024 and further workup was done. Workup included CT of abdomen pelvis which showed diffuse peritoneal carcinomatosis and mild ascites.  Also noted was distal small bowel obstruction which was likely due to peritoneal carcinomatosis.  There is a masslike region in the lower uterine and cervix representing primary cervical or endometrial carcinoma with liver mets noted.  GYN oncology was notified.  Medical oncology/Dr. Truett Perna also notified. Patient is seen awake and alert today sitting up in chair at bedside with IV tPA and ongoing via right PICC line.  She also has an NG tube intact to wall suction that is draining well.  She is a very pleasant lady who is a good historian. Medical history includes hypertension.  Surgical history is noncontributory and includes tubal ligation in her mid 96s. Cancer screening includes last mammogram in 2024 which she states was negative for malignancy.  Last Pap smear was done in her early 21s and patient states that was also negative. Family history is significant for 4 sisters with breast cancer.   Social history- she is originally from [place] and moved to the [place] in [place].  Denies tobacco use, denies illicit drug use, social  alcohol use 1 glass wine per week.  I have reviewed her chart and materials related to her cancer extensively and collaborated history with the patient. Summary of oncologic history is as follows: Oncology History   No history exists.    ASSESSMENT & PLAN:  1.  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 -Newly diagnosed February 2025 - Per CT scan done 02/04/2024 which shows diffuse peritoneal carcinomatosis and mild ascites. - Pelvic ultrasound on 02/05/2024 shows 2 cm heterogeneous area at the uterine fundus, endometrial malignancy not excluded. - Status post paracentesis done 02/05/2024, 100 mL of yellow fluid removed. 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 - Will most likely plan initiation of chemotherapy carboplatin and Taxol  regimen during this hospitalization. - Medical oncology/Dr. Truett Perna is following closely.  2.  Small bowel obstruction - Distal SBO with transition point in the right lower quadrant  -Likely due to peritoneal carcinomatosis - Continue drainage NG tube to wall suction as ordered - Patient is on TPN, infusing well and tolerating well - Surgery following  3.  Anemia, normocytic - Hemoglobin 10.7 today - Transfuse PRBC for hemoglobin <7.0.  No transfusional intervention warranted at this time - Monitor CBC with differential closely  4.  Hypertension - On losartan at home - Monitor BP counts - Primary team following     Orders Placed This Encounter  Procedures   Body fluid culture w Gram Stain    Standing Status:  Standing    Number of Occurrences:   1   DG Chest Portable 1 View    Standing Status:   Standing    Number of Occurrences:   1    Reason for Exam (SYMPTOM  OR DIAGNOSIS REQUIRED):   NG tube placement verification   DG Chest Portable 1 View    Standing Status:   Standing    Number of  Occurrences:   1    Reason for Exam (SYMPTOM  OR DIAGNOSIS REQUIRED):   NG tube replacement confirmation   IR Radiologist Eval & Mgmt    Standing Status:   Standing    Number of Occurrences:   1    Can the patient sign their own consent?:   Yes    Is the patient on any bloodthinners?:   No    Is the patient NPO:   Yes    If indicated for the ordered procedure, I authorize the administration of contrast media per Radiology protocol:   Yes    Indication for Consultation::   Peritoneal carcinomatosis, suspicion for gynecologic malignancy.  Per GYN request, perform paracentesis with cytology (if enough fluid) and additionally obtain most accessible biopsy possible.   US PELVIC COMPLETE WITH TRANSVAGINAL    Standing Status:   Standing    Number of Occurrences:   1    Symptom/Reason for Exam:   Peritoneal carcinomatosis (HCC) [161096]   US Paracentesis    Standing Status:   Standing    Number of Occurrences:   1    If therapeutic, is there a maximum amount of fluid to be removed?:   No    Are labs required for specimen collection?:   Yes    Lab orders requested (DO NOT place separate lab orders, these will be automatically ordered during procedure specimen collection)::   Cytology - Non Pap    Lab orders requested (DO NOT place separate lab orders, these will be automatically ordered during procedure specimen collection)::   Body Fluid Cell Count With Differential    Lab orders requested (DO NOT place separate lab orders, these will be automatically ordered during procedure specimen collection)::   Body Fluid Culture    Is Albumin medication needed?:   No    Can the patient sign their own consent?:   Yes    Is the patient on any bloodthinners?:   No    Symptom/Reason for Exam:   Ascites [[id number]]   DG Abd Portable 1V    Standing Status:   Standing    Number of Occurrences:   1    Symptom/Reason for Exam:   SBO (small bowel obstruction) (HCC) [045409]   [place] EKG SITE RITE    Standing Status:    Standing    Number of Occurrences:   1    If unable to confirm placement with [facility], place order for [employer] 1 View [phone]):   Yes   DG Chest 1 View    Standing Status:   Standing    Number of Occurrences:   1    Symptom/Reason for Exam:   Status post peripherally inserted central catheter (PICC) central line placement [[id number]]   DG Abd 1 View    Standing Status:   Standing    Number of Occurrences:   1    Symptom/Reason for Exam:   Abdominal distention [[id number]]    Symptom/Reason for Exam:   SBO (small bowel obstruction) (HCC) [[id number]]    Radiology Contrast Protocol -  do NOT remove file path:   [email or web address]   Lipase, blood    Standing Status:   Standing    Number of Occurrences:   1   Comprehensive metabolic panel    Standing Status:   Standing    Number of Occurrences:   1   CBC    Standing Status:   Standing    Number of Occurrences:   1   Urinalysis, Routine w reflex microscopic -Urine, Clean Catch    Standing Status:   Standing    Number of Occurrences:   1    Specimen Source:   Urine, Clean Catch [76]   Creatinine, serum    Baseline for enoxaparin therapy IF NOT ALREADY DRAWN.    Standing Status:   Standing    Number of Occurrences:   1   Creatinine, serum    while on enoxaparin therapy    Standing Status:   Standing    Number of Occurrences:   3   Comprehensive metabolic panel    Standing Status:   Standing    Number of Occurrences:   1   CBC    Standing Status:   Standing    Number of Occurrences:   1   CBC    Standing Status:   Standing    Number of Occurrences:   1   CA 125    Standing Status:   Standing    Number of Occurrences:   1   CEA    Standing Status:   Standing    Number of Occurrences:   1   Cancer antigen 19-9    Standing Status:   Standing    Number of Occurrences:   1   Body fluid cell count with differential    Standing Status:   Standing    Number of Occurrences:   1    CBC with Differential/Platelet    Standing Status:   Standing    Number of Occurrences:   1   Comprehensive metabolic panel    Standing Status:   Standing    Number of Occurrences:   1   Magnesium    Standing Status:   Standing    Number of Occurrences:   1   Ferritin    Standing Status:   Standing    Number of Occurrences:   1   Folate    Standing Status:   Standing    Number of Occurrences:   1   Iron and TIBC    Standing Status:   Standing    Number of Occurrences:   1   Vitamin B12    Standing Status:   Standing    Number of Occurrences:   1   CBC with Differential/Platelet    Standing Status:   Standing    Number of Occurrences:   1   Comprehensive metabolic panel    Standing Status:   Standing    Number of Occurrences:   1   Basic metabolic panel    Standing Status:   Standing    Number of Occurrences:   7   CBC    Standing Status:   Standing    Number of Occurrences:   7   Magnesium    Standing Status:   Standing    Number of Occurrences:   7   Phosphorus    Standing Status:   Standing    Number of Occurrences:   1   Comprehensive metabolic panel  Standing Status:   Standing    Number of Occurrences:   30   Magnesium    Standing Status:   Standing    Number of Occurrences:   3   Phosphorus    Standing Status:   Standing    Number of Occurrences:   3   Triglycerides    Standing Status:   Standing    Number of Occurrences:   3   Comprehensive metabolic panel    Standing Status:   Standing    Number of Occurrences:   1   Phosphorus    Standing Status:   Standing    Number of Occurrences:   1   Glucose, capillary    Standing Status:   Standing    Number of Occurrences:   1   Glucose, capillary    Standing Status:   Standing    Number of Occurrences:   1   Glucose, capillary    Standing Status:   Standing    Number of Occurrences:   1   Glucose, capillary    Standing Status:   Standing    Number of Occurrences:   1   Glucose, capillary     Standing Status:   Standing    Number of Occurrences:   1   Diet NPO time specified    Standing Status:   Standing    Number of Occurrences:   1   Gastric tube    Standing Status:   Standing    Number of Occurrences:   1    Gastric tube insertion route:   Nasal    Gastric tube to:   Low intermittent suction   Vital signs    Standing Status:   Standing    Number of Occurrences:   1   Notify physician (specify)    Standing Status:   Standing    Number of Occurrences:   20    Notify Physician:   for pulse less than 55 or greater than 120    Notify Physician:   for respiratory rate less than 12 or greater than 25    Notify Physician:   for temperature greater than 100.5 F    Notify Physician:   for urinary output less than 30 mL/hr for four hours    Notify Physician:   for systolic BP less than 90 or greater than 160, diastolic BP less than 60 or greater than 100    Notify Physician:   for new hypoxia w/ oxygen saturations < 88%   Mobility Protocol: No Restrictions RN to initiate protocols based on patient's level of care    RN to initiate protocols based on patient's level of care    Standing Status:   Standing    Number of Occurrences:   1   Refer to Sidebar Report Refer to ICU, Med-Surg, Progressive, and Step-Down Mobility Protocol Sidebars    Refer to ICU, Med-Surg, Progressive, and Step-Down Mobility Protocol Sidebars    Standing Status:   Standing    Number of Occurrences:   1   Initiate Adult Central Line Maintenance and Catheter Protocol for patients with central line (CVC, PICC, Port, Hemodialysis, Trialysis)    Standing Status:   Standing    Number of Occurrences:   1   Do not place and if present remove PureWick    Standing Status:   Standing    Number of Occurrences:   1   Initiate Oral Care Protocol    Standing  Status:   Standing    Number of Occurrences:   1   Initiate Carrier Fluid Protocol    Standing Status:   Standing    Number of Occurrences:   1   RN may  order General Admission PRN Orders utilizing "General Admission PRN medications" (through manage orders) for the following patient needs: allergy symptoms (Claritin), cold sores (Carmex), cough (Robitussin DM), eye irritation (Liquifilm Tears), hemorrhoids (Tucks), indigestion (Maalox), minor skin irritation (Hydrocortisone Cream), muscle pain Romeo Apple Gay), nose irritation (saline nasal spray) and sore throat (Chloraseptic spray).    Standing Status:   Standing    Number of Occurrences:   [phone]   Patient has an active order for admit to inpatient/place in observation    Standing Status:   Standing    Number of Occurrences:   1   Out of Bed    Standing Status:   Standing    Number of Occurrences:   1   1 % lidocaine HCL, up to 5 ml intradermal at insertion site for line placement    Standing Status:   Standing    Number of Occurrences:   1   When hypoglycemia is suspected obtain a STAT CBG  If treated, recheck every 15 minutes after each treatment until CBG >/= 70mg /dl    If treated, recheck every 15 minutes after each treatment until CBG >/= 70mg /dl    Standing Status:   Standing    Number of Occurrences:   1   Refer to Sidebar Report: Hypoglycemia Protocol for treatment of CBG 70mg /dl    Hypoglycemia Protocol for treatment of CBG 70mg /dl    Standing Status:   Standing    Number of Occurrences:   1   Notify physician (specify)  Specify: Notify attending and TPN ordering physician of fever > 101 F. If catheter is pulled, follow procedure to culture.    Standing Status:   Standing    Number of Occurrences:   [phone]    Specify:   Notify attending and TPN ordering physician of fever > 101 F. If catheter is pulled, follow procedure to culture.   TPN Instructions:  TPN tubing, cap, and filter to be changed daily by Vascular Access Team.    TPN tubing, cap, and filter to be changed daily by Vascular Access Team.    Standing Status:   Standing    Number of Occurrences:   1   Vascular Access Team to  check central line dressing daily and change as needed per policy.    Standing Status:   Standing    Number of Occurrences:   1   TPN Instructions:  Maintain TPN rate as ordered. Do not catch up. If TPN is temporarily discontinued or unavailable please notify pharmacy, who will place order for D10W or other appropriate solution at the rate that TPN was running.    Maintain TPN rate as ordered. Do not catch up. If TPN is temporarily discontinued or unavailable please notify pharmacy, who will place order for D10W or other appropriate solution at the rate that TPN was running.    Standing Status:   Standing    Number of Occurrences:   1   TPN Instructions:  The TPN port is for nutritional solutions only. No blood drawing, CVP readings, or administration of drugs, fluids, or blood products through the TPN port without physician consent. Consult pharmacist regarding compatibility of ...    The TPN port is for nutritional solutions only. No blood drawing, CVP  readings, or administration of drugs, fluids, or blood products through the TPN port without physician consent. Consult pharmacist regarding compatibility of drugs and TPN.    Standing Status:   Standing    Number of Occurrences:   1   TPN Instructions:  TPN must be given through a central line and before its first administration the location of the tip of the central line needs to be verified.    TPN must be given through a central line and before its first administration the location of the tip of the central line needs to be verified.    Standing Status:   Standing    Number of Occurrences:   1   TPN Instructions:  Cyclic TPN rate adjustments are to be done by nurse caring for the patient not by the vascular team.    Cyclic TPN rate adjustments are to be done by nurse caring for the patient not by the vascular team.    Standing Status:   Standing    Number of Occurrences:   1   Strict intake and output    With 8-hour and 24-hour summaries     Standing Status:   Standing    Number of Occurrences:   1   Weigh patient    Standing Status:   Standing    Number of Occurrences:   1   Vital signs    Standing Status:   Standing    Number of Occurrences:   1   TPN Instructions:  At the completion of therapy prior to discontinuing TPN; taper off by reducing the current rate by one-half for 2 hours then stop infusion.  Follow tapering instructions unless TPN needs to be discontinued due to an emergency si...    At the completion of therapy prior to discontinuing TPN; taper off by reducing the current rate by one-half for 2 hours then stop infusion.  Follow tapering instructions unless TPN needs to be discontinued due to an emergency situation.    Standing Status:   Standing    Number of Occurrences:   1   Refer to Sidebar Report - CHG Cloths Sidebar    - CHG Cloths Sidebar    Standing Status:   Standing    Number of Occurrences:   1   Patient Education: - [facility] Daily CHG Bathing    Standing Status:   Standing    Number of Occurrences:   1    Specify:   - [facility] Daily CHG Bathing   Apply needleless connector (cap)    Standing Status:   Standing    Number of Occurrences:   20   May draw labs from central line    Standing Status:   Standing    Number of Occurrences:   20   For first catheter occlusion notify IV team ([facility], [facility]) or provider at all other sites.    Standing Status:   Standing    Number of Occurrences:   1   May access port-a-cath if present    Standing Status:   Standing    Number of Occurrences:   20   Do not attempt resuscitation (DNR) Pre-Arrest Interventions Desired    Standing Status:   Standing    Number of Occurrences:   1    If pulseless and not breathing:   No CPR or chest compressions.    In Pre-Arrest Conditions (Patient Has Pulse and Is Breathing):   May intubate, use advanced airway interventions and cardioversion/ACLS medications  if appropriate or indicated. May transfer to ICU.    Consent::   Discussion  documented in EHR or advanced directives reviewed   Consult to general surgery  Send to phone number [phone] please    Send to phone number [phone] please    Standing Status:   Standing    Number of Occurrences:   1    Place call to::   oncall General Surgery    Reason for Consult:   Consult   Consult to hospitalist    Standing Status:   Standing    Number of Occurrences:   1    Place call to::   [facility]    Reason for Consult:   Admit   Consult to gynecologic oncology Consult Timeframe: ROUTINE - requires response within 24 hours; Instructions: At [facility] and [facility] campuses, [facility] consult orders are only for documentation-tracking purposes; a phone call MUST be made.; Reason for Consult? P...    Standing Status:   Standing    Number of Occurrences:   1    Consult Timeframe:   ROUTINE - requires response within 24 hours    Instructions:   At [facility] and [facility] campuses, [facility] consult orders are only for documentation-tracking purposes; a phone call MUST be made.    Reason for Consult?:   Peritoneal carcinomatosis from suspected gynecologic malignancy.   TPN per pharmacy consult    Standing Status:   Standing    Number of Occurrences:   1    Indication::   Prolonged Ileus   Consult to Registered Dietitian    Standing Status:   Standing    Number of Occurrences:   1    Reason for consult?:   New TNA/TPN   Oxygen therapy Mode or (Route): Nasal cannula; Liters Per Minute: 2; Keep O2 saturation between: greater than 92 %    Standing Status:   Standing    Number of Occurrences:   1    Mode or (Route):   Nasal cannula    Liters Per Minute:   2    Keep O2 saturation between:   greater than 92 %   Type and screen [facility]    [facility]     Standing Status:   Standing    Number of Occurrences:   1   ABO/Rh    Standing Status:   Standing    Number of Occurrences:   1   Routine line care    Standing Status:   Standing    Number of Occurrences:    33   Admit to Inpatient (patient's expected length of stay will be greater than 2 midnights or inpatient only procedure)    Standing Status:   Standing    Number of Occurrences:   1    Hospital Area:   [facility] [[id number]]    Level of Care:   Med-Surg [16]    May admit patient to [facility] or [facility] if equivalent level of care is available::   No    Covid Evaluation:   Asymptomatic - no recent exposure (last 10 days) testing not required    Diagnosis:   SBO (small bowel obstruction) (HCC) [295284]    Admitting Physician:   Rometta Emery [[id number]]    Attending Physician:   Rometta Emery [[id number]]    Certification::   I certify this patient will need inpatient services for at least 2 midnights    Expected Medical  Readiness:   02/08/2024     MEDICAL HISTORY:  Past Medical History:  Diagnosis Date   Cancer (HCC)    skin cancer leg -no problems now   GERD (gastroesophageal reflux disease)    mild -no meds   Heart murmur    "faint"   Hypertension     SURGICAL HISTORY: Past Surgical History:  Procedure Laterality Date   BREAST BIOPSY  05/28/1984   benign-unknown which breast   COLONOSCOPY W/ POLYPECTOMY     COLONOSCOPY WITH PROPOFOL N/A 08/06/2015   Procedure: COLONOSCOPY WITH PROPOFOL;  Surgeon: Charolett Bumpers, MD;  Location: [facility];  Service: Endoscopy;  Laterality: N/A;   TONSILLECTOMY     child   TUBAL LIGATION      SOCIAL HISTORY: Social History   Socioeconomic History   Marital status: Married    Spouse name: Not on file   Number of children: Not on file   Years of education: Not on file   Highest education level: Not on file  Occupational History   Not on file  Tobacco Use   Smoking status: Never   Smokeless tobacco: Not on file  Substance and Sexual Activity   Alcohol use: Yes    Comment: 1 glass wine nightly   Drug use: No   Sexual activity: Not on file  Other Topics Concern   Not on file  Social History Narrative   Not  on file   Social Drivers of Health   Financial Resource Strain: Not on file  Food Insecurity: No Food Insecurity (02/05/2024)   Hunger Vital Sign    Worried About Running Out of Food in the Last Year: Never true    Ran Out of Food in the Last Year: Never true  Transportation Needs: No Transportation Needs (02/05/2024)   PRAPARE - Administrator, Civil Service (Medical): No    Lack of Transportation (Non-Medical): No  Physical Activity: Not on file  Stress: Not on file  Social Connections: Socially Integrated (02/05/2024)   Social Connection and Isolation Panel [NHANES]    Frequency of Communication with Friends and Family: More than three times a week    Frequency of Social Gatherings with Friends and Family: Twice a week    Attends Religious Services: More than 4 times per year    Active Member of [employer] or Organizations: Yes    Attends Banker Meetings: 1 to 4 times per year    Marital Status: Married  Catering manager Violence: Not At Risk (02/05/2024)   Humiliation, Afraid, Rape, and Kick questionnaire    Fear of Current or Ex-Partner: No    Emotionally Abused: No    Physically Abused: No    Sexually Abused: No    FAMILY HISTORY: Family History  Problem Relation Age of Onset   Breast cancer Sister    Breast cancer Sister    Breast cancer Sister    Colon cancer Paternal Aunt     REVIEW OF SYSTEMS:   Constitutional: +Fatigue, denies fevers, chills or abnormal night sweats Eyes: Denies blurriness of vision, double vision or watery eyes Ears, nose, mouth, throat, and face: Denies mucositis or sore throat Respiratory: Denies cough, dyspnea or wheezes Cardiovascular: Denies palpitation, chest discomfort or lower extremity swelling Gastrointestinal: +Abdominal distention Skin: Denies abnormal skin rashes Lymphatics: Denies new lymphadenopathy or easy bruising Neurological: Denies numbness, tingling or new weaknesses Behavioral/Psych: Mood is stable, no  new changes  All other systems were reviewed with the patient and  are negative.  PHYSICAL EXAMINATION: ECOG PERFORMANCE STATUS: 2 - Symptomatic, <50% confined to bed  Vitals:   02/09/24 2027 02/10/24 0403  BP: 133/68 110/74  Pulse: 67 74  Resp: 15 16  Temp: 98.4 F (36.9 C) 98.9 F (37.2 C)  SpO2: 94% 96%   Filed Weights   02/10/24 0437  Weight: 125 lb (56.7 kg)    GENERAL: alert, + ill-appearing SKIN: +Pale skin color, texture, turgor are normal, no rashes or significant lesions EYES: normal, conjunctiva are pink and non-injected, sclera clear OROPHARYNX: no exudate, no erythema and lips, buccal mucosa, and tongue normal  NECK: supple, thyroid normal size, non-tender, without nodularity LYMPH: no palpable lymphadenopathy in the cervical, axillary or inguinal LUNGS: clear to auscultation and percussion with normal breathing effort HEART: regular rate & rhythm and no murmurs and no lower extremity edema ABDOMEN: abdomen soft, non-tender and normal bowel sounds MUSCULOSKELETAL: no cyanosis of digits and no clubbing  PSYCH: alert & oriented x 3 with fluent speech NEURO: no focal motor/sensory deficits   ALLERGIES:  is allergic to calcium-containing compounds.  MEDICATIONS:  Current Facility-Administered Medications  Medication Dose Route Frequency Provider Last Rate Last Admin   acetaminophen (TYLENOL) tablet 650 mg  650 mg Per Tube Q6H PRN Amin, Ankit C, MD       Chlorhexidine Gluconate Cloth 2 % PADS 6 each  6 each Topical Daily Amin, Ankit C, MD   6 each at 02/10/24 0817   enoxaparin (LOVENOX) injection 40 mg  40 mg Subcutaneous Q24H Earlie Lou L, MD   40 mg at 02/10/24 0758   guaiFENesin (ROBITUSSIN) 100 MG/5ML liquid 5 mL  5 mL Per Tube Q4H PRN Amin, Ankit C, MD       hydrALAZINE (APRESOLINE) injection 10 mg  10 mg Intravenous Q6H PRN Shalhoub, Deno Lunger, MD       insulin aspart (novoLOG) injection 0-9 Units  0-9 Units Subcutaneous Q8H Pricilla Riffle, RPH   1  Units at 02/10/24 0754   ipratropium-albuterol (DUONEB) 0.5-2.5 (3) MG/3ML nebulizer solution 3 mL  3 mL Nebulization Q4H PRN Amin, Ankit C, MD       losartan (COZAAR) tablet 100 mg  100 mg Per Tube q morning Earlie Lou L, MD   100 mg at 02/10/24 4098   magic mouthwash w/lidocaine  5 mL Oral TID PRN Amin, Ankit C, MD       menthol-cetylpyridinium (CEPACOL) lozenge 3 mg  1 lozenge Oral PRN Amin, Ankit C, MD       metoprolol tartrate (LOPRESSOR) injection 5 mg  5 mg Intravenous Q4H PRN Amin, Ankit C, MD       morphine (PF) 2 MG/ML injection 2 mg  2 mg Intravenous Q2H PRN Rometta Emery, MD       ondansetron (ZOFRAN) injection 4 mg  4 mg Intravenous Q6H PRN Earlie Lou L, MD   4 mg at 02/04/24 2031   pantoprazole (PROTONIX) injection 40 mg  40 mg Intravenous Q12H Shalhoub, Deno Lunger, MD   40 mg at 02/10/24 0757   senna-docusate (Senokot-S) tablet 1 tablet  1 tablet Per Tube QHS PRN Amin, Ankit C, MD       sodium chloride flush (NS) 0.9 % injection 10-40 mL  10-40 mL Intracatheter Q12H Amin, Ankit C, MD   10 mL at 02/10/24 0818   sodium chloride flush (NS) 0.9 % injection 10-40 mL  10-40 mL Intracatheter PRN Amin, Ankit C, MD       TPN  ADULT (ION)   Intravenous Continuous TPN Danford Bad, RPH 60 mL/hr at 02/09/24 1743 New Bag at 02/09/24 1743   traZODone (DESYREL) tablet 50 mg  50 mg Per Tube QHS PRN Amin, Ankit C, MD         LABORATORY DATA:  I have reviewed the data as listed Lab Results  Component Value Date   WBC 8.3 02/10/2024   HGB 10.7 (L) 02/10/2024   HCT 32.6 (L) 02/10/2024   MCV 96.2 02/10/2024   PLT 297 02/10/2024   Recent Labs    02/07/24 0449 02/08/24 0519 02/09/24 0419 02/10/24 0345  NA 141 138 141 140  K 3.7 3.5 3.8 3.9  CL 106 104 107 109  CO2 26 24 26 25   GLUCOSE 86 102* 118* 124*  BUN 18 21 21  24*  CREATININE 1.06* 0.95 0.92 0.87  CALCIUM 8.6* 8.5* 8.3* 8.2*  GFRNONAA 51* 58* >60 >60  PROT 5.4*  --  5.6* 5.6*  ALBUMIN 2.5*  --  2.5* 2.2*  AST  16  --  28 31  ALT 13  --  20 24  ALKPHOS 69  --  75 90  BILITOT 0.5  --  0.4 0.3    RADIOGRAPHIC STUDIES: I have personally reviewed the radiological images as listed and agreed with the findings in the report. DG Chest 1 View Result Date: 02/08/2024 CLINICAL DATA:  PICC line placement EXAM: CHEST  1 VIEW COMPARISON:  02/05/2024 FINDINGS: Right PICC line in place with the tip in the SVC. NG tube is in the stomach. Heart and mediastinal contours within normal limits. No confluent opacities or effusions. No acute bony abnormality. IMPRESSION: Right PICC line tip in the SVC. No active cardiopulmonary disease. Electronically Signed   By: Charlett Nose M.D.   On: 02/08/2024 17:51   [place] EKG SITE RITE Result Date: 02/08/2024 If Site Rite image not attached, placement could not be confirmed due to current cardiac rhythm.  DG Abd Portable 1V Result Date: 02/08/2024 CLINICAL DATA:  Check gastric catheter placement EXAM: PORTABLE ABDOMEN - 1 VIEW COMPARISON:  02/04/2024 FINDINGS: Scattered large and small bowel gas is noted. Mild small bowel dilatation is noted significantly improved when compared with the prior exam. Gastric catheter is noted with the tip in the stomach. No free air is noted. IMPRESSION: Persistent but significantly improved small bowel dilatation when compared with the prior exam. Electronically Signed   By: Alcide Clever M.D.   On: 02/08/2024 09:34   US Paracentesis Result Date: 02/05/2024 INDICATION: 89 year old with new abdominal distention, ascites. Request made for diagnostic and therapeutic paracentesis. EXAM: ULTRASOUND GUIDED DIAGNOSTIC  PARACENTESIS MEDICATIONS: 10 mL 1% lidocaine. COMPLICATIONS: None immediate. PROCEDURE: Informed written consent was obtained from the patient after a discussion of the risks, benefits and alternatives to treatment. A timeout was performed prior to the initiation of the procedure. Initial ultrasound scanning demonstrates a small amount of ascites  within the left lateral abdomen interlooped with bowel. The left lateral abdomen was prepped and draped in the usual sterile fashion. 1% lidocaine was used for local anesthesia. Following this, a 19 gauge, 7-cm, Yueh catheter was introduced. An ultrasound image was saved for documentation purposes. The paracentesis was performed. The catheter was removed and a dressing was applied. The patient tolerated the procedure well without immediate post procedural complication. FINDINGS: A total of approximately 100 mL of yellow fluid was removed. Samples were sent to the laboratory as requested by the clinical team. IMPRESSION: Successful  ultrasound-guided paracentesis yielding 100 mL of peritoneal fluid. Performed by: Loyce Dys PA-C Electronically Signed   By: Malachy Moan M.D.   On: 02/05/2024 14:31   US PELVIC COMPLETE WITH TRANSVAGINAL Result Date: 02/05/2024 CLINICAL DATA:  89 year old female with ascites, peritoneal carcinomatosis on CT Abdomen and Pelvis yesterday. EXAM: TRANSABDOMINAL AND TRANSVAGINAL ULTRASOUND OF PELVIS TECHNIQUE: Both transabdominal and transvaginal ultrasound examinations of the pelvis were performed. Transabdominal technique was performed for global imaging of the pelvis including uterus, ovaries, adnexal regions, and pelvic cul-de-sac. It was necessary to proceed with endovaginal exam following the transabdominal exam to visualize the ovaries. COMPARISON:  CT Abdomen and Pelvis 02/04/2024. FINDINGS: Uterus Measurements: 6.9 x 3.9 x 3.9 cm = volume: 41 mL. No obvious myometrial mass. Endometrium Heterogeneous hypoechoic area at the fundus endometrium (image 49 of series 1). This area encompasses about 2 cm (image 55). See also cine series 3 images. Otherwise the echogenic endometrial stripe is up to 5 mm. Right ovary Measurements: 3.5 x 2.1 x 2.8 cm = volume: 11 mL. No ovarian mass identified. Left ovary Measurements: 2.3 x 1.5 x 1.7 cm = volume: 3 mL. No ovarian mass identified.  Other findings Small volume ascites again visible in the pelvis. IMPRESSION: 1. No ovarian mass identified by ultrasound. 2. There is a 2 cm heterogeneous area at the uterine fundus which is favored to be endometrial related. Elsewhere the endometrial stripe is about 5 mm. An Endometrial Malignancy is not excluded. 3. Ascites, in conjunction with the extensive additional abdominal and pelvic abnormalities demonstrated by CT yesterday. Electronically Signed   By: Odessa Fleming M.D.   On: 02/05/2024 12:17   DG Chest Portable 1 View Result Date: 02/05/2024 CLINICAL DATA:  NG tube replacement confirmation EXAM: PORTABLE CHEST - 1 VIEW COMPARISON:  02/04/2024 FINDINGS: Cardiomediastinal silhouette and pulmonary vasculature are within normal limits. Lungs are clear. Nasogastric tube extends to the left upper quadrant the side hole located at the level of the gastroesophageal junction. It is slightly retracted compared to prior exam. IMPRESSION: Nasogastric tube terminates in the left upper quadrant in the expected site of the stomach. The side hole of the NG tube is located at the level of the gastroesophageal junction. Electronically Signed   By: Acquanetta Belling M.D.   On: 02/05/2024 07:38   DG Chest Portable 1 View Result Date: 02/04/2024 CLINICAL DATA:  NG tube placement. Abdominal discomfort, heartburn, and nausea. EXAM: PORTABLE CHEST 1 VIEW COMPARISON:  None Available. FINDINGS: The heart size and mediastinal contours are within normal limits. There is atherosclerotic calcification of the aorta. No consolidation, effusion, or pneumothorax. An enteric tube terminates in the stomach. No acute osseous abnormality. IMPRESSION: 1. No active disease. 2. Enteric tube terminates in the stomach. Electronically Signed   By: Thornell Sartorius M.D.   On: 02/04/2024 21:20   CT ABDOMEN PELVIS W CONTRAST Result Date: 02/04/2024 CLINICAL DATA:  Abdominal pain, bloating, nausea and vomiting for 2 weeks. * Tracking Code: BO * EXAM: CT  ABDOMEN AND PELVIS WITH CONTRAST TECHNIQUE: Multidetector CT imaging of the abdomen and pelvis was performed using the standard protocol following bolus administration of intravenous contrast. RADIATION DOSE REDUCTION: This exam was performed according to the departmental dose-optimization program which includes automated exposure control, adjustment of the mA and/or kV according to patient size and/or use of iterative reconstruction technique. CONTRAST:  OMNIPAQUE IOHEXOL 300 MG/ML  SOLN COMPARISON:  None Available. FINDINGS: Lower Chest: No acute findings. Hepatobiliary: A few tiny hepatic cysts are  seen, however, there are multiple hypovascular masses involving the right and left hepatic lobes, largest in the central right hepatic lobe measuring 1.9 x 1.5 cm on image 26/2. These are consistent with liver metastases. Gallbladder is unremarkable. No evidence of biliary ductal dilatation. Pancreas:  No mass or inflammatory changes. Spleen: Within normal limits in size and appearance. Adrenals/Urinary Tract: No suspicious masses identified. No evidence of ureteral calculi or hydronephrosis. Stomach/Bowel: Distal small bowel obstruction, with transition point in the right lower quadrant, likely due to peritoneal carcinoma. Rectal intussusception incidentally noted. Vascular/Lymphatic: Mild retroperitoneal lymphadenopathy in the left para-aortic and aortocaval spaces. No acute vascular findings. Reproductive: A 1.6 cm peripherally calcified fibroid is seen in the uterine fundus. Poorly defined masslike soft tissue prominence is seen in the region of the cervix and lower uterine segment measuring 3.6 x 3.3 cm on image 63/2, possibly representing a primary cervical or endometrial carcinoma. Poorly defined soft tissue density is seen in both adnexal regions and along the uterine fundus, consistent with peritoneal carcinomatosis. Other: Mild ascites is seen. Peritoneal and omental soft tissue nodules and masses are  seen throughout the abdomen and pelvis, consistent with peritoneal carcinomatosis. Largest conglomerate mass is seen in the right abdomen measuring approximately 6.7 x 3.4 cm on image 37/2. Musculoskeletal:  No suspicious bone lesions identified. IMPRESSION: Diffuse peritoneal carcinomatosis and mild ascites. Distal small bowel obstruction, with transition point in the right lower quadrant, likely due to peritoneal carcinomatosis. Masslike soft tissue prominence in the region of the lower uterine segment and cervix, possibly representing primary cervical or endometrial carcinoma. Liver metastases. Mild retroperitoneal lymphadenopathy, consistent with metastatic disease. Rectal intussusception incidentally noted. No definite lead mass identified. Electronically Signed   By: Danae Orleans M.D.   On: 02/04/2024 11:34     The total time spent in the appointment was 60 minutes encounter with patients including review of chart and various tests results, discussions about plan of care and coordination of care plan   All questions were answered. The patient knows to call the clinic with any problems, questions or concerns. No barriers to learning was detected.  Dawson Bills, NP 2/13/20259:24 AM  Ms. Sinnett was interviewed and examined.  I reviewed the admission CT, pathology report, and notes from consultants.  She presents with a small bowel obstruction secondary to carcinomatosis.  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  The differential diagnosis includes ovarian cancer and primary peritoneal carcinoma, and less likely uterine cancer.  No ovarian mass was identified on ultrasound.  She is passing a small amount of flatus and had a bowel movement this morning.  No therapy will be curative.  There is a significant chance of clinical improvement with chemotherapy.  I recommend treatment with paclitaxel/carboplatin.  We will submit the  cytology specimen for additional testing to include NGS sequencing, ER/PR, and HRD.  We will refer her to the genetics counselor for BRCA testing.  We will recommend subsequent treatment including maintenance therapy based on the results of the above testing.  Ms. Goldblatt does not have significant comorbid conditions and had an excellent performance status prior to becoming ill last month.  I will see her the a.m. 02/11/2024 with the plan to begin chemotherapy within the next 1-2 days.  Recommendations: Continue management of the small bowel obstruction per the surgical service Submit the cytology tissue for molecular testing, ER/PR, and HRD Paclitaxel/carboplatin to begin within the next 1-2 days Referred to genetics  counselor for BRCA testing Oncology will continue following her in the hospital and outpatient follow-up will be scheduled [facility]

## 2024-02-10 NOTE — Progress Notes (Signed)
GYN Oncology Progress Note  Patient seen by Dr. Pricilla Holm and myself.  Patient alert, oriented, resting in bed, in no acute distress, with family at the bedside. Reports doing well. Had a small bowel firmer BM this am and episode of loose stool this afternoon.    Endometrial biopsy from 02/07/24 returned with scanty atrophic glands with no EIN or malignancy. 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  Differentials include serous uterine cancer vs ovarian cancer. Plan of care and treatment recommendations discussed with patient and family by Dr. Pricilla Holm. Recommend CT chest to evaluate for pulmonary metastasis and pelvic MRI with and without contrast to further evaluate mass-like soft tissue prominence in the region of the lower uterine segment and cervix on CT imaging.

## 2024-02-10 NOTE — Progress Notes (Signed)
Mobility Specialist - Progress Note   02/10/24 0848  Mobility  Activity Ambulated with assistance in hallway;Ambulated with assistance to bathroom  Level of Assistance Modified independent, requires aide device or extra time  Assistive Device Front wheel walker  Distance Ambulated (ft) 500 ft  Activity Response Tolerated well  Mobility Referral Yes  Mobility visit 1 Mobility  Mobility Specialist Start Time (ACUTE ONLY) 0827  Mobility Specialist Stop Time (ACUTE ONLY) 0848  Mobility Specialist Time Calculation (min) (ACUTE ONLY) 21 min   Pt received in bed and agreeable to mobility. No complaints during session. Upon returning to room, pt requested assistance to the bathroom. Pt to recliner after session with all needs met.    [facility]

## 2024-02-10 NOTE — Plan of Care (Signed)
?  Problem: Clinical Measurements: ?Goal: Will remain free from infection ?Outcome: Progressing ?  ?

## 2024-02-10 NOTE — Progress Notes (Signed)
PROGRESS NOTE    Tracy Preston  RUE:[id number] DOB: 1935-01-30 DOA: 02/04/2024 PCP: Marden Noble, MD (Inactive)    Brief Narrative:  89 year old female with a history of hypertension, chronic kidney disease, family history of breast cancer who presents with abdominal distention and vomiting for approximately 2 weeks presenting to [facility] emergency department by her outpatient provider after CT scan of the abdomen is performed and is found to have findings concerning for new metastatic gynecological malignancy and small bowel obstruction.  Workup in the hospital showed concerning new onset peritoneal carcinomatosis likely GYN malignancy. General surgery and GYN oncology following.   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  Slowly resolving small bowel obstruction.  Management per general surgery at this time.  Currently NG tube is in place.  Paracentesis fluid now has resulted showing high-grade adenocarcinoma of GYN origin.  CA125 is elevated.  GYN oncology and oncology team consulted who plans on starting chemotherapy.  No acute surgical indication at this time.  Overall I am thinking this is poor prognosis therefore palliative care team has been consulted.  Family in agreement   Poor nutritional status Severe protein calorie malnutrition - Discussed with patient and family members are present at bedside.  Getting nutrition via TPN   Essential hypertension -Losartan.  IV as needed  GERD (gastroesophageal reflux disease) PPI  Anemia -Anemia of chronic disease.   DVT prophylaxis: enoxaparin (LOVENOX) injection 40 mg Start: 02/05/24 1000    Code  Status: Do not attempt resuscitation (DNR) PRE-ARREST INTERVENTIONS DESIRED Family Communication: Family members are present at bedside Status is: Inpatient Remains inpatient appropriate because: Continue hospital stay for SBO management    Subjective: Patient seen and examined at bedside.  Family members are also present at bedside.  We had prolonged discussion regarding her care and all the questions have been answered by me.  Patient continues to tell me that her abdomen feels tight but overall feels okay.  She had a small bowel movement this morning.   Examination:  General exam: Appears calm and comfortable ; NGT in place.  Respiratory system: Clear to auscultation. Respiratory effort normal. Cardiovascular system: S1 & S2 heard, RRR. No JVD, murmurs, rubs, gallops or clicks. No pedal edema. Gastrointestinal system: Abdomen is nondistended, soft and nontender. No organomegaly or masses felt. Normal bowel sounds heard. Central nervous system: Alert and oriented. No focal neurological deficits. Extremities: Symmetric 5 x 5 power. Skin: No rashes, lesions or ulcers Psychiatry: Judgement and insight appear normal. Mood & affect appropriate.                Diet Orders (From admission, onward)     Start     Ordered   02/04/24 2245  Diet NPO time specified  Diet effective now        02/04/24 2244            Objective: Vitals:   02/09/24 1341 02/09/24 2027 02/10/24 0403 02/10/24 0437  BP: 121/67 133/68 110/74   Pulse: 70 67 74   Resp: 16 15 16    Temp: 98.6 F (37 C) 98.4 F (36.9 C) 98.9 F (37.2 C)   TempSrc: Oral Oral Oral   SpO2: 97% 94% 96%  Weight:    56.7 kg    Intake/Output Summary (Last 24 hours) at 02/10/2024 1205 Last data filed at 02/10/2024 1000 Gross per 24 hour  Intake 1403.17 ml  Output 900 ml  Net 503.17 ml   Filed Weights   02/10/24 0437  Weight: 56.7 kg    Scheduled Meds:  Chlorhexidine Gluconate Cloth  6 each Topical Daily    enoxaparin (LOVENOX) injection  40 mg Subcutaneous Q24H   insulin aspart  0-9 Units Subcutaneous Q8H   losartan  100 mg Per Tube q morning   pantoprazole (PROTONIX) IV  40 mg Intravenous Q12H   sodium chloride flush  10-40 mL Intracatheter Q12H   Continuous Infusions:  TPN ADULT (ION) 60 mL/hr at 02/09/24 1743   TPN ADULT (ION)      Nutritional status Signs/Symptoms: severe fat depletion, moderate fat depletion Interventions: TPN Body mass index is 21.12 kg/m.  Data Reviewed:   CBC: Recent Labs  Lab 02/06/24 0526 02/07/24 0449 02/08/24 0519 02/09/24 0419 02/10/24 0345  WBC 6.6 7.2 7.9 8.0 8.3  NEUTROABS 4.7 5.2  --   --   --   HGB 9.9* 10.8* 10.8* 10.5* 10.7*  HCT 31.6* 34.3* 33.8* 33.5* 32.6*  MCV 96.6 96.9 95.5 98.5 96.2  PLT 262 288 288 305 297   Basic Metabolic Panel: Recent Labs  Lab 02/06/24 0526 02/07/24 0449 02/08/24 0519 02/09/24 0419 02/10/24 0345  NA 142 141 138 141 140  K 3.8 3.7 3.5 3.8 3.9  CL 105 106 104 107 109  CO2 31 26 24 26 25   GLUCOSE 115* 86 102* 118* 124*  BUN 23 18 21 21  24*  CREATININE 1.08* 1.06* 0.95 0.92 0.87  CALCIUM 8.9 8.6* 8.5* 8.3* 8.2*  MG 1.9  --  1.8 2.0 1.9  PHOS  --   --  2.9 2.3* 3.3   GFR: CrCl cannot be calculated (Unknown ideal weight.). Liver Function Tests: Recent Labs  Lab 02/05/24 0003 02/06/24 0526 02/07/24 0449 02/09/24 0419 02/10/24 0345  AST 19 15 16 28 31   ALT 19 15 13 20 24   ALKPHOS 82 62 69 75 90  BILITOT 1.1 0.5 0.5 0.4 0.3  PROT 6.7 5.3* 5.4* 5.6* 5.6*  ALBUMIN 3.2* 2.4* 2.5* 2.5* 2.2*   Recent Labs  Lab 02/04/24 1619  LIPASE 29   No results for input(s): "AMMONIA" in the last 168 hours. Coagulation Profile: No results for input(s): "INR", "PROTIME" in the last 168 hours. Cardiac Enzymes: No results for input(s): "CKTOTAL", "CKMB", "CKMBINDEX", "TROPONINI" in the last 168 hours. BNP (last 3 results) No results for input(s): "PROBNP" in the last 8760 hours. HbA1C: No results for  input(s): "HGBA1C" in the last 72 hours. CBG: Recent Labs  Lab 02/08/24 2345 02/09/24 0805 02/09/24 1604 02/10/24 0027 02/10/24 0731  GLUCAP 110* 134* 128* 140* 124*   Lipid Profile: No results for input(s): "CHOL", "HDL", "LDLCALC", "TRIG", "CHOLHDL", "LDLDIRECT" in the last 72 hours. Thyroid Function Tests: No results for input(s): "TSH", "T4TOTAL", "FREET4", "T3FREE", "THYROIDAB" in the last 72 hours. Anemia Panel: No results for input(s): "VITAMINB12", "FOLATE", "FERRITIN", "TIBC", "IRON", "RETICCTPCT" in the last 72 hours. Sepsis Labs: No results for input(s): "PROCALCITON", "LATICACIDVEN" in the last 168 hours.  Recent Results (from the past 240 hours)  Body fluid culture w Gram Stain     Status: None   Collection Time: 02/05/24  2:14 PM   Specimen: PATH Cytology Pleural fluid  Result Value Ref Range Status   Specimen Description  Final    PLEURAL Performed at [facility], [address] [place], [place] [address]    Special Requests   Final    NONE Performed at [facility], [address], [place], [place] [address]    Gram Stain WBC SEEN NO ORGANISMS SEEN CYTOSPIN SMEAR   Final   Culture   Final    NO GROWTH 3 DAYS Performed at [facility], [address], [place], [place] [address]    Report Status 02/09/2024 FINAL  Final         Radiology Studies: DG Abd 1 View Result Date: 02/10/2024 CLINICAL DATA:  [address] Abdominal distention [id number] [phone] SBO (small bowel obstruction) (HCC) [id number] EXAM: ABDOMEN - 1 VIEW COMPARISON:  02/08/2024 FINDINGS: NG tube terminates within the proximal stomach. Worsening small bowel dilation in the mid abdomen measuring up to 5.3 cm (previously 3.3 cm. Paucity of bowel gas within the colon. No gross free intraperitoneal air. IMPRESSION: 1. Worsening small bowel obstruction. 2. NG tube terminates within the proximal stomach. Recommend advancement. Electronically Signed   By: Duanne Guess D.O.   On: 02/10/2024 09:35   DG Chest 1 View Result Date: 02/08/2024 CLINICAL DATA:  PICC line placement EXAM: CHEST  1 VIEW COMPARISON:  02/05/2024 FINDINGS: Right PICC line in place with the tip in the SVC. NG tube is in the stomach. Heart and mediastinal contours within normal limits. No confluent opacities or effusions. No acute bony abnormality. IMPRESSION: Right PICC line tip in the SVC. No active cardiopulmonary disease. Electronically Signed   By: Charlett Nose M.D.   On: 02/08/2024 17:51           LOS: 6 days   Time spent= 35 mins    Miguel Rota, MD [facility]  If 7PM-7AM, please contact night-coverage  02/10/2024, 12:05 PM

## 2024-02-10 NOTE — Progress Notes (Addendum)
Nutrition Follow-up  DOCUMENTATION CODES:   Non-severe (moderate) malnutrition in context of acute illness/injury  INTERVENTION:  TPN pharmacy to monitor and adjust.  Advance diet as medically applicable   NUTRITION DIAGNOSIS:   Moderate Malnutrition related to acute illness as evidenced by severe fat depletion, moderate fat depletion.    GOAL:   Patient will meet greater than or equal to 90% of their needs    MONITOR:   Diet advancement, Weight trends, Labs  REASON FOR ASSESSMENT:   Consult New TPN/TNA  ASSESSMENT:   89 y.o. F, Presented to ED with complaints of abdominal distention and vomiting for the last 2 weeks. CT showed concerning findings and she was sent to ED for further evaluation. PMH; HTN, CKD.  Patient setting up in chair. Stated that she was ok today. York Spaniel it was just confirmed that she has ovarian cancer. Waiting to speak with family and oncologist for further GOC.  Stated she did have a very small amount of loose stool this morning.  Received consult today.  Patient being followed by dietitian. RD met with Patient this morning.   - NGT low intermittent suctioning continues. 24 hr out put Continues with TPN support, 50ml/hr With Protein   kcal/kg kcal %  Protein 5.59 316.8 21.36  Dextrose 12.95 734.4 49.51  Lipids 7.62 432 29.13  Total 26.16 1,483.2      NUTRITION - FOCUSED PHYSICAL EXAM:  Flowsheet Row Most Recent Value  Orbital Region Moderate depletion  Upper Arm Region Moderate depletion  Thoracic and Lumbar Region Moderate depletion  Buccal Region Mild depletion  Temple Region Mild depletion  Clavicle Bone Region Severe depletion  Clavicle and Acromion Bone Region Severe depletion  Scapular Bone Region Severe depletion  Dorsal Hand Severe depletion  Patellar Region Mild depletion  Anterior Thigh Region Mild depletion  Posterior Calf Region Mild depletion  Edema (RD Assessment) None  Hair Reviewed  Eyes Reviewed  Mouth  Reviewed  Skin Reviewed  Nails Reviewed       Diet Order:   Diet Order             Diet NPO time specified  Diet effective now                   EDUCATION NEEDS:   Education needs have been addressed  Skin:  Skin Assessment: Reviewed RN Assessment  Last BM:  02/10/24  Height:   Ht Readings from Last 1 Encounters:  08/06/15 5' 4.5" (1.638 m)    Weight:   Wt Readings from Last 1 Encounters:  02/10/24 56.7 kg    Ideal Body Weight:     BMI:  Body mass index is 21.12 kg/m.  Estimated Nutritional Needs:   Kcal:  1625-1900 kcal  Protein:  70-90 g  Fluid:  7ml/kcal    Jamelle Haring RDN, LDN Clinical Dietitian   If unable to reach, please contact "RD Inpatient" secure chat group between 8 am-4 pm daily"

## 2024-02-10 NOTE — Plan of Care (Signed)
Problem: Education: Goal: Knowledge of General Education information will improve Description: Including pain rating scale, medication(s)/side effects and non-pharmacologic comfort measures Outcome: Progressing   Problem: Activity: Goal: Risk for activity intolerance will decrease Outcome: Progressing

## 2024-02-10 NOTE — Progress Notes (Signed)
PT Cancellation Note  Patient Details Name: KATERRA INGMAN MRN: [id number] DOB: 08/23/1935   Cancelled Treatment:    Reason Eval/Treat Not Completed: PT screened, no needs identified, will sign off. Chart review revealed pt ambulating 300-700 ft, modified ind with mobility specialist; will sign off at this time.    Tori Metzli Pollick PT, DPT 02/10/24, 1:48 PM

## 2024-02-10 NOTE — Consult Note (Signed)
Consultation Note Date: 02/10/2024   Patient Name: Tracy Preston  DOB: 08-21-1935  MRN: [id number]  Age / Sex: 89 y.o., female   PCP: Marden Noble, MD (Inactive) Referring Physician: Miguel Rota, MD  Reason for Consultation: Establishing goals of care     Chief Complaint/History of Present Illness:   Patient is a 89 year old female with past medical history of hypertension, CKD, and family history of breast cancer who was admitted on 02/04/2024 for management of abdominal distention and vomiting for approximately 2 weeks.  Imaging concerning for new metastatic gynecological malignancy with small bowel obstruction.  Imaging also showed likely metastatic disease to liver and peritoneal carcinomatosis.  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  Gyn Onc and oncology consulted for recommendations.  Palliative medicine team consulted to assist with complex medical decision making.  Extensive review of EMR prior to presenting to bedside.  Discussed care with hospitalist as well for updates.  Patient has poor nutritional status and has been receiving TPN for support.  Patient has NG tube in place for management of malignant small bowel obstruction secondary to peritoneal carcinomatosis.  Current plan is for patient to start inpatient chemotherapy.  Presented to bedside to meet with patient.  Patient able to ambulate around room and use restroom on her own.  Once patient comfortable in bed, with permission able to introduce myself as a member of the palliative medicine team and my role in patient's medical journey.  Spent time learning about patient's life outside of the hospital.  Patient described that prior to onset of symptoms, she had a great quality of life.  Patient was living independently and taking care of her ADLs and IADLs.  Patient enjoyed things like gardening and visiting with her friends and family.  She was able to discussed that her husband had died in 02/17/21  from cancer quite suddenly after being well for years.  She described that her husband had hospice support at the end of his life.  Spent time providing emotional support via active listening.  With permission, we then discussed patient's medical diagnosis of high-grade adenocarcinoma of GYN origin and that it is metastatic meaning she has stage IV cancer which is not curable, therapies aimed at her cancer would only be to "keep it at bay" divide more time.  Patient acknowledged this.  We discussed possible pathways for medical care moving forward.  Discussed aggressive medical care including cancer directed therapies such as chemotherapy and hospitalizations.  With permission also able to discuss alternate pathway for medical care including focusing on symptom management and quality time moving forward.  Patient easily able to acknowledge that quality of life is more important to her than quantity of life.  Good quality of life for the patient moving forward would be time spent at home with her family being able to interact and feeling as well as she can.  Able to discuss interventions that can assist in symptom management such as steroids, octreotide, and potentially venting G-tube for management to allow time at home.  Patient also expressed that one of her priorities is that she not be in pain at the end of life.  Acknowledged that pain is a symptom that can be managed as well.  Patient wanting to discuss prognosis.  Expressed concern that based on her cancer and metastatic disease noted, patient's prognosis would likely be months.  Complicating this is also her lack of nutrition and continuing to deteriorate functional status with acute illness and hospitalization.  With permission, also able to discuss CODE STATUS with patient.  Patient able to provide MOST form in the room that has DNR.  Again reviewed CODE STATUS with patient in full code versus DNR/DNI.  Patient easily able to vocalize that in the  event of cardiac or respiratory arrest she would not want interventions such as cardiac resuscitation and intubation with mechanical ventilation.  CODE STATUS will be appropriately changed to DNR/DNI.  Also signed and provided patient with goal DNR form.  Spent time providing emotional support via active listening.  Thanked patient for allowing me to speak with her today.  Discussed care with IDT including oncology, gyn onc, hospitalist, surgery, and RN  Primary Diagnoses  Present on Admission:  SBO (small bowel obstruction) (HCC)  GERD (gastroesophageal reflux disease)  Essential hypertension  Peritoneal carcinomatosis (HCC)   Past Medical History:  Diagnosis Date   Cancer (HCC)    skin cancer leg -no problems now   GERD (gastroesophageal reflux disease)    mild -no meds   Heart murmur    "faint"   Hypertension    Social History   Socioeconomic History   Marital status: Married    Spouse name: Not on file   Number of children: Not on file   Years of education: Not on file   Highest education level: Not on file  Occupational History   Not on file  Tobacco Use   Smoking status: Never   Smokeless tobacco: Not on file  Substance and Sexual Activity   Alcohol use: Yes    Comment: 1 glass wine nightly   Drug use: No   Sexual activity: Not on file  Other Topics Concern   Not on file  Social History Narrative   Not on file   Social Drivers of Health   Financial Resource Strain: Not on file  Food Insecurity: No Food Insecurity (02/05/2024)   Hunger Vital Sign    Worried About Running Out of Food in the Last Year: Never true    Ran Out of Food in the Last Year: Never true  Transportation Needs: No Transportation Needs (02/05/2024)   PRAPARE - Administrator, Civil Service (Medical): No    Lack of Transportation (Non-Medical): No  Physical Activity: Not on file  Stress: Not on file  Social Connections: Socially Integrated (02/05/2024)   Social Connection and  Isolation Panel [NHANES]    Frequency of Communication with Friends and Family: More than three times a week    Frequency of Social Gatherings with Friends and Family: Twice a week    Attends Religious Services: More than 4 times per year    Active Member of [employer] or Organizations: Yes    Attends Banker Meetings: 1 to 4 times per year    Marital Status: Married   Family History  Problem Relation Age of Onset   Breast cancer Sister    Breast cancer Sister    Breast cancer Sister    Colon cancer Paternal Aunt    Scheduled Meds:  Chlorhexidine Gluconate Cloth  6 each Topical Daily   enoxaparin (LOVENOX) injection  40 mg Subcutaneous Q24H   insulin aspart  0-9 Units Subcutaneous Q8H   losartan  100 mg Per Tube q morning   pantoprazole (PROTONIX) IV  40 mg Intravenous Q12H   sodium chloride flush  10-40 mL Intracatheter Q12H   Continuous Infusions:  TPN ADULT (ION) 60 mL/hr at 02/09/24 1743   TPN ADULT (ION)  PRN Meds:.acetaminophen, guaiFENesin, hydrALAZINE, ipratropium-albuterol, magic mouthwash w/lidocaine, menthol-cetylpyridinium, metoprolol tartrate, morphine injection, ondansetron (ZOFRAN) IV, phenol, senna-docusate, sodium chloride flush, traZODone Allergies  Allergen Reactions   Calcium-Containing Compounds Other (See Comments)    Upsets the stomach, so it is not taken very often   CBC:    Component Value Date/Time   WBC 8.3 02/10/2024 0345   HGB 10.7 (L) 02/10/2024 0345   HCT 32.6 (L) 02/10/2024 0345   PLT 297 02/10/2024 0345   MCV 96.2 02/10/2024 0345   NEUTROABS 5.2 02/07/2024 0449   LYMPHSABS 1.0 02/07/2024 0449   MONOABS 0.8 02/07/2024 0449   EOSABS 0.1 02/07/2024 0449   BASOSABS 0.0 02/07/2024 0449   Comprehensive Metabolic Panel:    Component Value Date/Time   NA 140 02/10/2024 0345   K 3.9 02/10/2024 0345   CL 109 02/10/2024 0345   CO2 25 02/10/2024 0345   BUN 24 (H) 02/10/2024 0345   CREATININE 0.87 02/10/2024 0345   GLUCOSE 124 (H)  02/10/2024 0345   CALCIUM 8.2 (L) 02/10/2024 0345   AST 31 02/10/2024 0345   ALT 24 02/10/2024 0345   ALKPHOS 90 02/10/2024 0345   BILITOT 0.3 02/10/2024 0345   PROT 5.6 (L) 02/10/2024 0345   ALBUMIN 2.2 (L) 02/10/2024 0345    Physical Exam: Vital Signs: BP 110/74 (BP Location: Left Arm)   Pulse 74   Temp 98.9 F (37.2 C) (Oral)   Resp 16   Wt 56.7 kg   SpO2 96%   BMI 21.12 kg/m  SpO2: SpO2: 96 % O2 Device: O2 Device: Room Air O2 Flow Rate:   Intake/output summary:  Intake/Output Summary (Last 24 hours) at 02/10/2024 1119 Last data filed at 02/10/2024 1000 Gross per 24 hour  Intake 1403.17 ml  Output 900 ml  Net 503.17 ml   LBM: Last BM Date : 02/10/24 Baseline Weight: Weight: 56.7 kg Most recent weight: Weight: 56.7 kg  General: NAD, alert, frail Cardiovascular: RRR, no edema in LE b/l Respiratory: no increased work of breathing noted, not in respiratory distress Skin: no rashes or lesions on visible skin Neuro: A&Ox4, following commands easily Psych: appropriately answers all questions          Palliative Performance Scale: 60%              Additional Data Reviewed: Recent Labs    02/09/24 0419 02/10/24 0345  WBC 8.0 8.3  HGB 10.5* 10.7*  PLT 305 297  NA 141 140  BUN 21 24*  CREATININE 0.92 0.87    Imaging: DG Abd 1 View CLINICAL DATA:  [id number] Abdominal distention [id number] SBO (small bowel obstruction) (HCC) [id number]  EXAM: ABDOMEN - 1 VIEW  COMPARISON:  02/08/2024  FINDINGS: NG tube terminates within the proximal stomach. Worsening small bowel dilation in the mid abdomen measuring up to 5.3 cm (previously 3.3 cm. Paucity of bowel gas within the colon. No gross free intraperitoneal air.  IMPRESSION: 1. Worsening small bowel obstruction. 2. NG tube terminates within the proximal stomach. Recommend advancement.  Electronically Signed   By: Duanne Guess D.O.   On: 02/10/2024 09:35    I personally reviewed recent imaging.    Palliative Care Assessment and Plan Summary of Established Goals of Care and Medical Treatment Preferences   Patient is a 89 year old female with past medical history of hypertension, CKD, and family history of breast cancer who was admitted on 02/04/2024 for management of abdominal distention and vomiting for approximately 2 weeks.  Imaging concerning for new  metastatic gynecological malignancy with small bowel obstruction.  Imaging also showed likely metastatic disease to liver and peritoneal carcinomatosis.  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  Gyn Onc and oncology consulted for recommendations.  Palliative medicine team consulted to assist with complex medical decision making.  # Complex medical decision making/goals of care  -Extensive discussion with patient as detailed above in HPI.  Discussed possible pathways for medical care moving forward including continuing with aggressive medical management versus comfort focused care.  Patient able to discuss importance to her quality of life.  Quality of life to her would entail being able to coherently interact with her loved ones and make more memories.  Patient able to discuss how quality of life is more important than quantity of life.  Patient continues discussions with oncologist regarding cancer directed therapies.  Patient noting at this time willing to try chemo though if it causes her more symptom burden that is unbearable, she would not want to pursue further chemo and would instead like to focus on her comfort and time at home.  Patient's priorities are that her symptoms to be managed and that she not be in pain.  Noted palliative medicine team continue to follow along and discussed medical care moving forward.  -  Code Status: Limited: Do not attempt resuscitation (DNR) -DNR-LIMITED -Do Not Intubate/DNI     -Discussed CODE STATUS with patient as detailed above in HPI.  Patient has MOST form on file stating DNR and  also can state she would not want cardiac resuscitation or intubation with mechanical ventilation in the event of cardiac or respiratory arrest.  CODE STATUS appropriately updated.  # Symptom management Patient is receiving these palliative interventions for symptom management with an intent to improve quality of life.   -Malignant small bowel obstruction secondary to peritoneal carcinomatosis   -Recommend use of octreotide and dexamethasone.  Discussed with care team.  At least starting IV dexamethasone 8 mg daily today.    -Madariaga A, Cherlynn Kaiser, Ghoshal A, Dzier?anowski T, Verl Bangs, Sobocki J, Dickman A, Furness K, Harmon, Crawford GB, Lheureux S. [facility] multidisciplinary evidence-based recommendations for the management of malignant bowel obstruction in advanced cancer. Support Care Cancer. 2022 Jun;30(6):4711-4728. doi: 10.1007/s00520-022-06889-8. Epub 2022 Mar 10. PMID: [id number]; PMCID: [id number].  # Psycho-social/Spiritual Support:  - Support System: daughters, son-in-law, 8 grandchildren   # Discharge Planning:  To Be Determined  Thank you for allowing the palliative care team to participate in the care [facility].  [name], DO Palliative Care Provider PMT # [phone]  If patient remains symptomatic despite maximum doses, please call PMT at [phone] between 0700 and 1900. Outside of these hours, please call attending, as PMT does not have night coverage.

## 2024-02-10 NOTE — Progress Notes (Signed)
PHARMACY - TOTAL PARENTERAL NUTRITION CONSULT NOTE   Indication: Prolonged ileus   Assessment: 89 year old female with SBO likely in setting of malignancy. CT abd/pelvis with peritoneal carcinomatosis, masslike soft tissue in the ureter/cervix. S/p endometrial biopsy by GynOnc, pathology pending for definitive diagnosis. Pharmacy consulted for TPN management.  Glucose / Insulin: no hx DM -CBGs at goal < 150 -2u SSI this am Electrolytes: WNL, including corrCa Renal: ok Hepatic: Alb low at 2.2; LFTs/Tbili WNL I/O:  -UOP adequate -NG output 650 ml recorded yesterday GI Imaging: 2/11 Abd xray: persistent but significantly improved small bowel dilatation when compared with prior GI Surgeries / Procedures: NA  Central access: PICC TPN start date: 2/11  Nutritional Goals: Goal TPN rate is 70 mL/hr (provides 84 g of protein and 1696 kcals per day)  RD Assessment: Estimated Needs Total Energy Estimated Needs: 1625-1900 kcal Total Protein Estimated Needs: 70-90 g Total Fluid Estimated Needs: 62ml/kcal  Current Nutrition: NPO  Plan:  -Advance TPN to 70 mL/hr at 1800 -Electrolytes in TPN: Na 50 mEq/L, K 50 mEq/L, Ca 5 mEq/L, Mg 5 mEq/L, Phos 15 mmol/L. Cl:Ac ratio 1:1 -Add standard MVI and trace elements to TPN -Continue Sensitive q8h SSI and adjust as needed  -Monitor TPN labs on Mon/Thurs at minimum, more frequently as needed   Pricilla Riffle, PharmD, BCPS Clinical Pharmacist 02/10/2024 9:25 AM

## 2024-02-10 NOTE — Progress Notes (Addendum)
Subjective: CC: Reports she spoke with GYN onc yesterday and med onc today. They are considering chemo in house.   Her abdomen does feel tight but she denies pain or nausea. Passing a small amount of flatus. Small liquid bm today.   Afebrile. WBC wnl.   Objective: Vital signs in last 24 hours: Temp:  [98.4 F (36.9 C)-98.9 F (37.2 C)] 98.9 F (37.2 C) (02/13 0403) Pulse Rate:  [67-74] 74 (02/13 0403) Resp:  [15-16] 16 (02/13 0403) BP: (110-133)/(67-74) 110/74 (02/13 0403) SpO2:  [94 %-97 %] 96 % (02/13 0403) Weight:  [56.7 kg] 56.7 kg (02/13 0437) Last BM Date : 02/10/24  Intake/Output from previous day: 02/12 0701 - 02/13 0700 In: 1378.8 [P.O.:60; I.V.:1318.8] Out: 1300 [Urine:750; Emesis/NG output:550] Intake/Output this shift: Total I/O In: 30 [P.O.:30] Out: -   PE: Gen:  Alert, NAD, pleasant Abd: Soft, mild to mod distension, NT, some BS. NGT in place on suction.   Lab Results:  Recent Labs    02/09/24 0419 02/10/24 0345  WBC 8.0 8.3  HGB 10.5* 10.7*  HCT 33.5* 32.6*  PLT 305 297   BMET Recent Labs    02/09/24 0419 02/10/24 0345  NA 141 140  K 3.8 3.9  CL 107 109  CO2 26 25  GLUCOSE 118* 124*  BUN 21 24*  CREATININE 0.92 0.87  CALCIUM 8.3* 8.2*   PT/INR No results for input(s): "LABPROT", "INR" in the last 72 hours. CMP     Component Value Date/Time   NA 140 02/10/2024 0345   K 3.9 02/10/2024 0345   CL 109 02/10/2024 0345   CO2 25 02/10/2024 0345   GLUCOSE 124 (H) 02/10/2024 0345   BUN 24 (H) 02/10/2024 0345   CREATININE 0.87 02/10/2024 0345   CALCIUM 8.2 (L) 02/10/2024 0345   PROT 5.6 (L) 02/10/2024 0345   ALBUMIN 2.2 (L) 02/10/2024 0345   AST 31 02/10/2024 0345   ALT 24 02/10/2024 0345   ALKPHOS 90 02/10/2024 0345   BILITOT 0.3 02/10/2024 0345   GFRNONAA >60 02/10/2024 0345   Lipase     Component Value Date/Time   LIPASE 29 02/04/2024 1619    Studies/Results: DG Abd 1 View Result Date: 02/10/2024 CLINICAL DATA:   [id number] Abdominal distention [id number] SBO (small bowel obstruction) (HCC) [id number] EXAM: ABDOMEN - 1 VIEW COMPARISON:  02/08/2024 FINDINGS: NG tube terminates within the proximal stomach. Worsening small bowel dilation in the mid abdomen measuring up to 5.3 cm (previously 3.3 cm. Paucity of bowel gas within the colon. No gross free intraperitoneal air. IMPRESSION: 1. Worsening small bowel obstruction. 2. NG tube terminates within the proximal stomach. Recommend advancement. Electronically Signed   By: Duanne Guess D.O.   On: 02/10/2024 09:35   DG Chest 1 View Result Date: 02/08/2024 CLINICAL DATA:  PICC line placement EXAM: CHEST  1 VIEW COMPARISON:  02/05/2024 FINDINGS: Right PICC line in place with the tip in the SVC. NG tube is in the stomach. Heart and mediastinal contours within normal limits. No confluent opacities or effusions. No acute bony abnormality. IMPRESSION: Right PICC line tip in the SVC. No active cardiopulmonary disease. Electronically Signed   By: Charlett Nose M.D.   On: 02/08/2024 17:51   [place] EKG SITE RITE Result Date: 02/08/2024 If Site Rite image not attached, placement could not be confirmed due to current cardiac rhythm.    Anti-infectives: Anti-infectives (From admission, onward)    None  Assessment/Plan pSBO - pSBO Suspected secondary to peritoneal carcinomatosis with evidence of suspected malignant ascites, origin has not been definitively established yet. CT w/ masslike soft tissue prominence in the region of the lower uterine segment and cervix, Liver metastases and Mild retroperitoneal lymphadenopathy. CA -125 noted to be elevated at 584  - S/p Paracentesis 02/05/24-100 cc of yellow fluid sent for cytology.  Noted high cell count at 2850. Cytology pending. Per notes, prelim path leaning towards GYN malignancy  - GYN ONC following. S/p pelvic exam and endometrial bx. Path neg.   Regarding her partial small bowel obstruction, continue NG tube to low  intermittent wall suction for now while awaiting final recs from gynonc and medonc as this appears to be 2/2 secondary to peritoneal carcinomatosis and they are reported to be considering chemo while inpatient. She is passing some flatus and had a small bm today. May eventually may be able to remove NG tube and allow liquid diet, may however need continued decompression.  Nutrition plan per primary, gynonc, medonc team.  Surgery team available to support as needed.     FEN - NPO, NGT to LIWS, IVF per primary. PICC/TPN VTE - SCDs, Lovenox ID - None  I reviewed nursing notes, last 24 h vitals and pain scores, last 48 h intake and output, last 24 h labs and trends, and last 24 h imaging results.   LOS: 6 days    Jacinto Halim, [facility] Surgery 02/10/2024, 9:39 AM Please see Amion for pager number during day hours 7:00am-4:30pm

## 2024-02-11 ENCOUNTER — Inpatient Hospital Stay (HOSPITAL_COMMUNITY): Payer: Medicare PPO

## 2024-02-11 DIAGNOSIS — Z7189 Other specified counseling: Secondary | ICD-10-CM | POA: Diagnosis not present

## 2024-02-11 DIAGNOSIS — K56609 Unspecified intestinal obstruction, unspecified as to partial versus complete obstruction: Secondary | ICD-10-CM | POA: Diagnosis not present

## 2024-02-11 DIAGNOSIS — Z515 Encounter for palliative care: Secondary | ICD-10-CM | POA: Diagnosis not present

## 2024-02-11 DIAGNOSIS — C801 Malignant (primary) neoplasm, unspecified: Secondary | ICD-10-CM | POA: Diagnosis not present

## 2024-02-11 DIAGNOSIS — C787 Secondary malignant neoplasm of liver and intrahepatic bile duct: Secondary | ICD-10-CM | POA: Diagnosis not present

## 2024-02-11 DIAGNOSIS — R4589 Other symptoms and signs involving emotional state: Secondary | ICD-10-CM | POA: Diagnosis not present

## 2024-02-11 DIAGNOSIS — C786 Secondary malignant neoplasm of retroperitoneum and peritoneum: Secondary | ICD-10-CM | POA: Diagnosis not present

## 2024-02-11 DIAGNOSIS — E440 Moderate protein-calorie malnutrition: Secondary | ICD-10-CM | POA: Diagnosis not present

## 2024-02-11 LAB — GLUCOSE, CAPILLARY
Glucose-Capillary: 111 mg/dL — ABNORMAL HIGH (ref 70–99)
Glucose-Capillary: 112 mg/dL — ABNORMAL HIGH (ref 70–99)
Glucose-Capillary: 141 mg/dL — ABNORMAL HIGH (ref 70–99)

## 2024-02-11 LAB — CBC
HCT: 33.5 % — ABNORMAL LOW (ref 36.0–46.0)
Hemoglobin: 10.7 g/dL — ABNORMAL LOW (ref 12.0–15.0)
MCH: 31.3 pg (ref 26.0–34.0)
MCHC: 31.9 g/dL (ref 30.0–36.0)
MCV: 98 fL (ref 80.0–100.0)
Platelets: 304 10*3/uL (ref 150–400)
RBC: 3.42 MIL/uL — ABNORMAL LOW (ref 3.87–5.11)
RDW: 12.1 % (ref 11.5–15.5)
WBC: 9.6 10*3/uL (ref 4.0–10.5)
nRBC: 0 % (ref 0.0–0.2)

## 2024-02-11 LAB — BASIC METABOLIC PANEL
Anion gap: 8 (ref 5–15)
BUN: 28 mg/dL — ABNORMAL HIGH (ref 8–23)
CO2: 22 mmol/L (ref 22–32)
Calcium: 8.7 mg/dL — ABNORMAL LOW (ref 8.9–10.3)
Chloride: 111 mmol/L (ref 98–111)
Creatinine, Ser: 0.84 mg/dL (ref 0.44–1.00)
GFR, Estimated: >60 mL/min (ref >60)
Glucose, Bld: 130 mg/dL — ABNORMAL HIGH (ref 70–99)
Potassium: 4.2 mmol/L (ref 3.5–5.1)
Sodium: 141 mmol/L (ref 135–145)

## 2024-02-11 LAB — MAGNESIUM: Magnesium: 2 mg/dL (ref 1.7–2.4)

## 2024-02-11 MED ORDER — DIPHENHYDRAMINE HCL 50 MG/ML IJ SOLN
25.0000 mg | Freq: Once | INTRAMUSCULAR | Status: AC
  Administered 2024-02-12: 25 mg via INTRAVENOUS
  Filled 2024-02-11: qty 1

## 2024-02-11 MED ORDER — SODIUM CHLORIDE 0.9 % IV SOLN
135.0000 mg/m2 | Freq: Once | INTRAVENOUS | Status: AC
  Administered 2024-02-12: 210 mg via INTRAVENOUS
  Filled 2024-02-11 (×2): qty 35

## 2024-02-11 MED ORDER — TRAVASOL 10 % IV SOLN
INTRAVENOUS | Status: AC
  Filled 2024-02-11: qty 840

## 2024-02-11 MED ORDER — HEPARIN (PORCINE) 25000 UT/250ML-% IV SOLN
900.0000 [IU]/h | INTRAVENOUS | Status: DC
  Administered 2024-02-11 – 2024-02-12 (×2): 850 [IU]/h via INTRAVENOUS
  Administered 2024-02-14: 750 [IU]/h via INTRAVENOUS
  Administered 2024-02-15 (×2): 850 [IU]/h via INTRAVENOUS
  Administered 2024-02-16: 1150 [IU]/h via INTRAVENOUS
  Administered 2024-02-17 – 2024-02-18 (×2): 1200 [IU]/h via INTRAVENOUS
  Administered 2024-02-19: 1100 [IU]/h via INTRAVENOUS
  Administered 2024-02-20 – 2024-02-25 (×5): 900 [IU]/h via INTRAVENOUS
  Filled 2024-02-11 (×13): qty 250

## 2024-02-11 MED ORDER — TRAVASOL 10 % IV SOLN: INTRAVENOUS | Status: DC

## 2024-02-11 MED ORDER — IOHEXOL 300 MG/ML  SOLN
75.0000 mL | Freq: Once | INTRAMUSCULAR | Status: AC | PRN
  Administered 2024-02-11: 75 mL via INTRAVENOUS

## 2024-02-11 MED ORDER — SODIUM CHLORIDE 0.9 % IV SOLN
150.0000 mg | Freq: Once | INTRAVENOUS | Status: AC
  Administered 2024-02-12: 150 mg via INTRAVENOUS
  Filled 2024-02-11: qty 150

## 2024-02-11 MED ORDER — DEXAMETHASONE SODIUM PHOSPHATE 10 MG/ML IJ SOLN
10.0000 mg | Freq: Once | INTRAMUSCULAR | Status: AC
  Administered 2024-02-12: 10 mg via INTRAVENOUS
  Filled 2024-02-11: qty 1

## 2024-02-11 MED ORDER — GADOBUTROL 1 MMOL/ML IV SOLN
5.0000 mL | Freq: Once | INTRAVENOUS | Status: AC | PRN
  Administered 2024-02-11: 5 mL via INTRAVENOUS

## 2024-02-11 MED ORDER — FAMOTIDINE IN NACL 20-0.9 MG/50ML-% IV SOLN
20.0000 mg | Freq: Once | INTRAVENOUS | Status: AC
  Administered 2024-02-12: 20 mg via INTRAVENOUS
  Filled 2024-02-11: qty 50

## 2024-02-11 MED ORDER — DEXAMETHASONE SODIUM PHOSPHATE 10 MG/ML IJ SOLN
8.0000 mg | Freq: Every day | INTRAMUSCULAR | Status: DC
  Administered 2024-02-13 – 2024-02-25 (×13): 8 mg via INTRAVENOUS
  Filled 2024-02-11 (×13): qty 1

## 2024-02-11 MED ORDER — PALONOSETRON HCL INJECTION 0.25 MG/5ML
0.2500 mg | Freq: Once | INTRAVENOUS | Status: AC
  Administered 2024-02-12: 0.25 mg via INTRAVENOUS
  Filled 2024-02-11: qty 5

## 2024-02-11 MED ORDER — HEPARIN BOLUS VIA INFUSION
3000.0000 [IU] | Freq: Once | INTRAVENOUS | Status: AC
  Administered 2024-02-11: 3000 [IU] via INTRAVENOUS
  Filled 2024-02-11: qty 3000

## 2024-02-11 MED ORDER — SODIUM CHLORIDE 0.9 % IV SOLN
297.0000 mg | Freq: Once | INTRAVENOUS | Status: AC
  Administered 2024-02-12: 300 mg via INTRAVENOUS
  Filled 2024-02-11 (×4): qty 30

## 2024-02-11 NOTE — Progress Notes (Signed)
GYN Oncology Progress Note 89 year old female admitted w/a malignant bowel obstruction secondary to advanced, metastatic gynecologic malignancy. Endometrial biopsy from 02/07/24 returned with scanty atrophic glands with no EIN or malignancy. 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   She reports doing well this am. Has passed flatus and had BMs. Son in law at the bedside. She has been ambulating frequently with feels steady. Voiding without difficulty. Tolerating ice chips   Exam: Alert, oriented, in no acute distress, resting in bed. Lungs clear. Heart regular in rate and rhythm. Abdomen less distended, hypoactive bowel sounds. NG tube to intermittent suction. No lower extremity edema.    Plan moving forward discussed with patient and family by Dr. Pricilla Holm. Plan for MRI and CT scan today with hopes for initiation of chemotherapy under the guidance of Dr. Truett Perna tomorrow. Plan will be for carboplatin and taxol. Based on the MRI results, there may be further discussion about possible biopsy to obtain additional tissue to guide treatment decisions. Patient will eventually need to be transferred to the oncology floor. Dr. Pricilla Holm spoke with Maylon Cos in [place] here at the [facility]. Inpatient genetics consultation is not possible but will be arranged outpatient.

## 2024-02-11 NOTE — Progress Notes (Signed)
OT Cancellation Note  Patient Details Name: Tracy Preston MRN: [id number] DOB: Sep 03, 1935   Cancelled Treatment:    Reason Eval/Treat Not Completed: OT screened, no needs identified, will sign off  Galen Manila 02/11/2024, 9:47 AM

## 2024-02-11 NOTE — Progress Notes (Addendum)
Pt TPN clamped and left attached to pt PICC line lumen. Per ICU Charge Ethan, RN, this would be best course of action so pt could have MRI performed. Will restart TPN when pt returns to floor.  1255: Pt returned to floor and TPN restarted.

## 2024-02-11 NOTE — Plan of Care (Signed)

## 2024-02-11 NOTE — Progress Notes (Signed)
Daily Progress Note   Patient Name: Tracy Preston       Date: 02/11/2024 DOB: 1935-10-29  Age: 89 y.o. MRN#: [id number] Attending Physician: Miguel Rota, MD Primary Care Physician: Marden Noble, MD (Inactive) Admit Date: 02/04/2024 Length of Stay: 7 days  Reason for Consultation/Follow-up: Establishing goals of care  Subjective:   Reviewed EMR prior to presenting to bedside.  Patient obtaining MRI pelvis and chest CT for staging purposes.  Oncology discussed care with Gyn Onc and plan is to start chemotherapy today.  Patient continues to have NG tube in place for suction.  Receiving TPN.  Attempted to visit with patient in the morning though was gone for imaging.  Discussed care with RN for updates.  Presented to bedside in the afternoon once informed patient had returned from imaging.  Plan is for patient to transfer to 6 floor for chemotherapy. Discussed care plan with patient.  Patient voiced appreciation for our extensive time spent by Dr. Truett Perna and Dr. Pricilla Holm following up regarding pathways for medical care.  At this time patient agreeing to proceed with dose of chemotherapy.  Hope is with patient's functional status, chemotherapy can improve her overall survival.  Patient noted that she did discuss with them both importance of quality of life to her.  Patient stated that based on their discussions, hope is that patient can continue to have quality of life while also getting a little more quantity.  Acknowledged this.  Patient also acknowledged that should the side effects be too difficult with chemotherapy, care team has agreed that she can say to discontinue aggressive medical interventions at any point.  Acknowledged this as well.  Also discussed how palliative medicine team will continue to follow along to assist with symptom management as able.  Patient voiced appreciation for this.  Patient denied any symptoms of concern that needed to be addressed at this time.  Spent time  visiting for emotional support and answering questions as able.  Noted palliative medicine team will continue to follow along with patient's medical journey.  Patient voiced appreciation for continued open conversations and information.  Objective:   Vital Signs:  BP (!) 148/62 (BP Location: Left Arm)   Pulse 71   Temp (!) 97.5 F (36.4 C) (Oral)   Resp 18   Wt 53.9 kg   SpO2 96%   BMI 20.09 kg/m   Physical Exam:  General: NAD, alert Cardiovascular: RRR, no edema in LE b/l Respiratory: no increased work of breathing noted, not in respiratory distress Skin: no rashes or lesions on visible skin Neuro: A&Ox4, following commands easily Psych: appropriately answers all questions  Imaging: I personally reviewed recent imaging.   Assessment & Plan:   Assessment: Patient is a 89 year old female with past medical history of hypertension, CKD, and family history of breast cancer who was admitted on 02/04/2024 for management of abdominal distention and vomiting for approximately 2 weeks.  Imaging concerning for new metastatic gynecological malignancy with small bowel obstruction.  Imaging also showed likely metastatic disease to liver and peritoneal carcinomatosis.  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  Gyn Onc and oncology consulted for recommendations.  Palliative medicine team consulted to assist with complex medical decision making.   Recommendations/Plan: # Complex medical decision making/goals of care: -Discussed care with patient as detailed above in HPI.  Patient was able to follow-up with oncologist and  Gyn Onc to answer her questions regarding chemotherapy management moving forward.  Patient able to discuss with them  importance of her quality of life.  Patient has decided to proceed with initial round of chemotherapy.  Based on outcomes and symptom burden from this, patient can then decide if she would want to proceed with further chemotherapy or  discontinue aggressive medical interventions.    -Patient has continued to state that quality of life is incredibly important to her.  Patient's priorities are that her symptoms to be managed and that she not be in pain.                 -  Code Status: Limited: Do not attempt resuscitation (DNR) -DNR-LIMITED -Do Not Intubate/DNI    # Symptom management Patient is receiving these palliative interventions for symptom management with an intent to improve quality of life.                 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  Receiving IV dexamethasone for symptom management.  Should it be needed, would consider addition of octreotide.                                                -Madariaga A, Cherlynn Kaiser, Ghoshal A, Dzier?anowski T, Verl Bangs, Sobocki J, Dickman A, Furness K, [address], [place] GB, Lheureux S. [facility] multidisciplinary evidence-based recommendations for the management of malignant bowel obstruction in advanced cancer. Support Care Cancer. 2022 Jun;30(6):4711-4728. doi: 10.1007/s00520-022-06889-8. Epub 2022 Mar 10. PMID: [id number]; PMCID: [id number].   # Psycho-social/Spiritual Support:  - Support System: daughters, son-in-law, 8 grandchildren    # Discharge Planning:  To Be Determined  Discussed with: Patient, RN  Thank you for allowing the palliative care team to participate in the care Tracy Preston.  [name], DO Palliative Care Provider PMT # [phone]  If patient remains symptomatic despite maximum doses, please call PMT at [phone] between 0700 and 1900. Outside of these hours, please call attending, as PMT does not have night coverage.  Personally spent 35 minutes in patient care including extensive chart review (labs, imaging, progress/consult notes, vital signs), medically appropraite exam, discussed with treatment team, education to  patient, family, and staff, documenting clinical information, medication review and management, coordination of care, and available advanced directive documents.

## 2024-02-11 NOTE — Progress Notes (Signed)
PROGRESS NOTE    Tracy Preston  ZOX:[id number] DOB: [birth year] DOA: 02/04/2024 PCP: Marden Noble, MD (Inactive)    Brief Narrative:  89 year old female with a history of hypertension, chronic kidney disease, family history of breast cancer who presents with abdominal distention and vomiting for approximately 2 weeks presenting to [facility] emergency department by her outpatient provider after CT scan of the abdomen is performed and is found to have findings concerning for new metastatic gynecological malignancy and small bowel obstruction.  Workup in the hospital showed concerning new onset peritoneal carcinomatosis likely GYN malignancy. General surgery and GYN oncology following.  For now we will be planning further MRI pelvic, CT chest with contrast and chemotherapy in the hospital.   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  Oncology and GYN oncology has seen the patient and planning to further delineate anatomy with MRI pelvis, CT chest. - Plan for now to go ahead and start chemotherapy for cycle - MRI pelvis, CT chest - Continue steroids for malignant bowel bowel obstruction.  If necessary later we can add octreotide.  In the future we can also discuss G-tube placement depending on her response to chemotherapy. -Family understands that overall she can have poor prognosis and we may have to shift our focus towards comfort care depending on her tolerance to the treatment planned above.  Poor nutritional status Severe protein calorie  malnutrition - TPN   Essential hypertension -Losartan.  IV as needed  GERD (gastroesophageal reflux disease) PPI  Anemia -Anemia of chronic disease.   DVT prophylaxis: enoxaparin (LOVENOX) injection 40 mg Start: 02/05/24 1000    Code Status: Do not attempt resuscitation (DNR) PRE-ARREST INTERVENTIONS DESIRED Family Communication: Family members up to date Status is: Inpatient Remains inpatient appropriate because: Continue hospital stay for SBO management    Subjective: Feeling better today.  Does not have any new complaints.  Examination:  General exam: Appears calm and comfortable ; NGT in place.  Respiratory system: Clear to auscultation. Respiratory effort normal. Cardiovascular system: S1 & S2 heard, RRR. No JVD, murmurs, rubs, gallops or clicks. No pedal edema. Gastrointestinal system: Abdomen is nondistended, soft and nontender. No organomegaly or masses felt. Normal bowel sounds heard. Central nervous system: Alert and oriented. No focal neurological deficits. Extremities: Symmetric 5 x 5 power. Skin: No rashes, lesions or ulcers Psychiatry: Judgement and insight appear normal. Mood & affect appropriate.          Diet Orders (From admission, onward)     Start     Ordered   02/04/24 2245  Diet NPO time specified  Diet effective now        02/04/24 2244            Objective: Vitals:   02/10/24 2017 02/11/24 0624 02/11/24 0632 02/11/24 0900  BP: (!) 148/79 (!) 148/62    Pulse: 71 71    Resp: 18 18    Temp: 97.7 F (36.5 C) (!) 97.5 F (36.4 C)    TempSrc: Oral Oral    SpO2: 97% 96%    Weight:   53.9 kg 53.9 kg  Height:  5\' 4"  (1.626 m)    Intake/Output Summary (Last 24 hours) at 02/11/2024 1202 Last data filed at 02/11/2024 0940 Gross per 24 hour  Intake 604.11 ml  Output 1550 ml  Net -945.89 ml   Filed Weights   02/10/24 0437 02/11/24 0632 02/11/24 0900  Weight: 56.7 kg 53.9 kg 53.9 kg    Scheduled Meds:  Chlorhexidine Gluconate  Cloth  6 each Topical Daily   dexamethasone (DECADRON) injection  8 mg Intravenous Daily   enoxaparin (LOVENOX) injection  40 mg Subcutaneous Q24H   insulin aspart  0-9 Units Subcutaneous Q8H   losartan  100 mg Per Tube q morning   pantoprazole (PROTONIX) IV  40 mg Intravenous Q12H   sodium chloride flush  10-40 mL Intracatheter Q12H   Continuous Infusions:  TPN ADULT (ION) 70 mL/hr at 02/10/24 1715   TPN ADULT (ION)      Nutritional status Signs/Symptoms: severe fat depletion, moderate fat depletion Interventions: TPN Body mass index is 20.41 kg/m.  Data Reviewed:   CBC: Recent Labs  Lab 02/06/24 0526 02/07/24 0449 02/08/24 0519 02/09/24 0419 02/10/24 0345 02/11/24 0337  WBC 6.6 7.2 7.9 8.0 8.3 9.6  NEUTROABS 4.7 5.2  --   --   --   --   HGB 9.9* 10.8* 10.8* 10.5* 10.7* 10.7*  HCT 31.6* 34.3* 33.8* 33.5* 32.6* 33.5*  MCV 96.6 96.9 95.5 98.5 96.2 98.0  PLT 262 288 288 305 297 304   Basic Metabolic Panel: Recent Labs  Lab 02/06/24 0526 02/07/24 0449 02/08/24 0519 02/09/24 0419 02/10/24 0345 02/11/24 0337  NA 142 141 138 141 140 141  K 3.8 3.7 3.5 3.8 3.9 4.2  CL 105 106 104 107 109 111  CO2 31 26 24 26 25 22   GLUCOSE 115* 86 102* 118* 124* 130*  BUN 23 18 21 21  24* 28*  CREATININE 1.08* 1.06* 0.95 0.92 0.87 0.84  CALCIUM 8.9 8.6* 8.5* 8.3* 8.2* 8.7*  MG 1.9  --  1.8 2.0 1.9 2.0  PHOS  --   --  2.9 2.3* 3.3  --    GFR: Estimated Creatinine Clearance: 40.9 mL/min (by C-G formula based on SCr of 0.84 mg/dL). Liver Function Tests: Recent Labs  Lab 02/05/24 0003 02/06/24 0526 02/07/24 0449 02/09/24 0419 02/10/24 0345  AST 19 15 16 28 31   ALT 19 15 13 20 24   ALKPHOS 82 62 69 75 90  BILITOT 1.1 0.5 0.5 0.4 0.3  PROT 6.7 5.3* 5.4* 5.6* 5.6*  ALBUMIN 3.2* 2.4* 2.5* 2.5* 2.2*   Recent Labs  Lab 02/04/24 1619  LIPASE 29   No results for input(s): "AMMONIA" in the last 168 hours. Coagulation Profile: No results for input(s): "INR", "PROTIME" in the  last 168 hours. Cardiac Enzymes: No results for input(s): "CKTOTAL", "CKMB", "CKMBINDEX", "TROPONINI" in the last 168 hours. BNP (last 3 results) No results for input(s): "PROBNP" in the last 8760 hours. HbA1C: No results for input(s): "HGBA1C" in the last 72 hours. CBG: Recent Labs  Lab 02/10/24 0027 02/10/24 0731 02/10/24 1541 02/10/24 2350 02/11/24 0834  GLUCAP 140* 124* 125* 163* 112*   Lipid Profile: No results for input(s): "CHOL", "HDL", "LDLCALC", "TRIG", "CHOLHDL", "LDLDIRECT" in the last 72 hours. Thyroid Function Tests: No results for input(s): "TSH", "T4TOTAL", "FREET4", "T3FREE", "THYROIDAB" in the last 72 hours. Anemia Panel: No results for input(s): "VITAMINB12", "FOLATE", "FERRITIN", "TIBC", "IRON", "RETICCTPCT" in the last 72 hours. Sepsis Labs: No results for input(s): "PROCALCITON", "LATICACIDVEN" in the last 168 hours.  Recent Results (from the past 240 hours)  Body fluid culture w Gram Stain     Status: None   Collection Time: 02/05/24  2:14 PM   Specimen: PATH Cytology Pleural fluid  Result Value Ref Range Status   Specimen Description   Final    PLEURAL Performed at [facility], [address], [place], [place] [address]    Special Requests   Final    NONE Performed at [facility], [address], [place], [place] [address]    Gram Stain WBC SEEN NO ORGANISMS SEEN CYTOSPIN SMEAR   Final   Culture   Final    NO GROWTH 3 DAYS Performed at [facility], [address], [place], [place] [address]    Report Status 02/09/2024 FINAL  Final         Radiology Studies: DG Abd 1 View Result Date: 02/10/2024 CLINICAL DATA:  [id number] Abdominal distention [id number] SBO (small bowel obstruction) (HCC) [id number] EXAM: ABDOMEN - 1 VIEW COMPARISON:  02/08/2024 FINDINGS: NG tube terminates within the proximal stomach. Worsening small bowel dilation in the mid abdomen measuring up to 5.3 cm (previously 3.3 cm.  Paucity of bowel gas within the colon. No gross free intraperitoneal air. IMPRESSION: 1. Worsening small bowel obstruction. 2. NG tube terminates within the proximal stomach. Recommend advancement. Electronically Signed   By: Duanne Guess D.O.   On: 02/10/2024 09:35           LOS: 7 days   Time spent= 35 mins    Miguel Rota, MD [facility]  If 7PM-7AM, please contact night-coverage  02/11/2024, 12:02 PM

## 2024-02-11 NOTE — Progress Notes (Signed)
PT Cancellation Note  Patient Details Name: EMALYNN CLEWIS MRN: [id number] DOB: 06-18-1935   Cancelled Treatment:    Reason Eval/Treat Not Completed: PT screened, no needs identified, will sign off  Received another PT order.  Pt currently mobilizing well with mobility specialist.  Pt appears to be starting chemotherapy soon.  Will request mobility specialist to check in with therapy if mobility declines.  Will sign off at this time but please reorder if/when needs change.   Janan Halter Payson 02/11/2024, 4:48 PM Paulino Door, DPT Physical Therapist Acute Rehabilitation Services Office: [phone]

## 2024-02-11 NOTE — Progress Notes (Signed)
START OFF PATHWAY REGIMEN - Other   [id number]:Carboplatin AUC=5 IV D1 + Paclitaxel 135 mg/m2 IV D1 q21 Days:   A cycle is every 21 days:     Paclitaxel      Carboplatin   **Always confirm dose/schedule in your pharmacy ordering system**  Patient Characteristics: Intent of Therapy: Non-Curative / Palliative Intent, Discussed with Patient

## 2024-02-11 NOTE — Progress Notes (Signed)
PHARMACY - TOTAL PARENTERAL NUTRITION CONSULT NOTE   Indication: Prolonged ileus   Assessment: 89 year old female with SBO likely in setting of malignancy. CT abd/pelvis with peritoneal carcinomatosis, masslike soft tissue in the ureter/cervix. S/p endometrial biopsy by GynOnc, pathology pending for definitive diagnosis. Pharmacy consulted for TPN management.  Glucose / Insulin: no hx DM -CBGs at goal < 150 -4u SSI previous 24 hrs -Dexamethasone 8 mg daily started 2/13 Electrolytes: WNL, including corrCa Renal: ok Hepatic: Alb low at 2.2; LFTs/Tbili WNL I/O:  -UOP 1200 ml yesterday -NG output 300 ml recorded yesterday GI Imaging: 2/11 Abd xray: persistent but significantly improved small bowel dilatation when compared with prior GI Surgeries / Procedures: NA  Central access: PICC TPN start date: 2/11  Nutritional Goals: Goal TPN rate is 70 mL/hr (provides 84 g of protein and 1696 kcals per day)  RD Assessment: Estimated Needs Total Energy Estimated Needs: 1625-1900 kcal Total Protein Estimated Needs: 70-90 g Total Fluid Estimated Needs: 14ml/kcal  Current Nutrition: NPO  Plan:  -Continue TPN at 70 mL/hr -Electrolytes in TPN: Na 50 mEq/L, K 40 mEq/L, Ca 3 mEq/L, Mg 5 mEq/L, Phos 15 mmol/L. Cl:Ac ratio 1:1 -Add standard MVI and trace elements to TPN -Continue Sensitive q8h SSI and adjust as needed  -Monitor TPN labs on Mon/Thurs at minimum, more frequently as needed   Pricilla Riffle, PharmD, BCPS Clinical Pharmacist 02/11/2024 10:48 AM

## 2024-02-11 NOTE — Progress Notes (Signed)
Pt will receive dex 10mg  IVP with chemo tx on 2/15, so daily dex 8mg  IVP order will resume on Sunday 2/16 per Dr. Truett Perna.  Drusilla Kanner, PharmD, MBA

## 2024-02-11 NOTE — Progress Notes (Signed)
PHARMACY - ANTICOAGULATION CONSULT NOTE  Pharmacy Consult for IV Heparin Indication: pulmonary embolus  Allergies  Allergen Reactions   Calcium-Containing Compounds Other (See Comments)    Upsets the stomach, so it is not taken very often    Patient Measurements: Height: 5\' 4"  (162.6 cm) Weight: 53.9 kg (118 lb 14.4 oz) IBW/kg (Calculated) : 54.7  Vital Signs: Temp: 97.8 F (36.6 C) (02/14 1517) Temp Source: Oral (02/14 1517) BP: 121/70 (02/14 1517) Pulse Rate: 73 (02/14 1517)  Labs: Recent Labs    02/09/24 0419 02/10/24 0345 02/11/24 0337  HGB 10.5* 10.7* 10.7*  HCT 33.5* 32.6* 33.5*  PLT 305 297 304  CREATININE 0.92 0.87 0.84    Estimated Creatinine Clearance: 40.9 mL/min (by C-G formula based on SCr of 0.84 mg/dL).   Medical History: Past Medical History:  Diagnosis Date   Cancer (HCC)    skin cancer leg -no problems now   GERD (gastroesophageal reflux disease)    mild -no meds   Heart murmur    "faint"   Hypertension     Medications:  Medications Prior to Admission  Medication Sig Dispense Refill Last Dose/Taking   calcium-vitamin D (OSCAL WITH D) 500-200 MG-UNIT per tablet Take 1 tablet by mouth every 7 (seven) days.   Past Week   Cholecalciferol (VITAMIN D3) 1000 units CAPS Take 1,000 Units by mouth daily with breakfast.   02/03/2024 Morning   clotrimazole (ANTI-FUNGAL) 1 % cream Apply 1 Application topically daily as needed (for any yeast issues- affected areas).   Taking As Needed   losartan (COZAAR) 100 MG tablet Take 100 mg by mouth every morning.   02/03/2024    Assessment: Pharmacy is consulted to start heparin drip in 89 yo female diagnosed with acute PE. CT of chest shows Incidental acute pulmonary embolism involving the distal right pulmonary artery and lobar and segmental right upper lobe and right middle lobe pulmonary artery branches. No saddle embolus. Normal caliber main pulmonary artery. Borderline elevated RV/LV Ratio = 1.0. 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 Pt has been on enoxaparin 40 mg daily with LD being on 2/14 at 0931.    Today, 02/11/24 Hgb 10.7, plt 304  SCr 0.84 mg/dl, Crcl 41 ml/min    Goal of Therapy:  Heparin level 0.3-0.7 units/ml Monitor platelets by anticoagulation protocol: Yes   Plan:  Heparin 3000 unit bolus followed heparin 850 units/hr  Obtain HL 8 hours after start of infusion  Daily CBC while on heparin Monitor for signs and symptoms of bleeding    Adalberto Cole, PharmD, BCPS 02/11/2024 4:37 PM

## 2024-02-11 NOTE — Progress Notes (Addendum)
Tracy Preston   DOB:11/15/1935   ZO#:[id number]      ASSESSMENT & PLAN:  1.  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 -Newly diagnosed February 2025 - CT scan done 02/04/2024 shows diffuse peritoneal carcinomatosis and mild ascites. - Pelvic ultrasound on 02/05/2024 shows 2 cm heterogeneous area at the uterine fundus, endometrial malignancy not excluded. - S/p paracentesis 02/05/2024, 100 mL of yellow fluid removed. 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 - Plan initiation of chemotherapy carboplatin and Taxol  regimen today. Discussed between GYN Onc and Med Onc.  -MRI pelvis and CT chest for staging pending. - Medical oncology/Dr. Truett Perna is following closely.   2.  Small bowel obstruction - Distal SBO with transition point in the right lower quadrant  -Likely due to peritoneal carcinomatosis - On NGT to wall suction.   - TPN continues to infuse well.   - Surgery following   3.  Anemia, normocytic - Hemoglobin remains stable at 10.7 today  - Transfuse PRBC for hemoglobin <7.0.  No transfusional intervention warranted at this time - Monitor CBC with differential closely   4.  Hypertension - On losartan at home - Monitor BP counts - Primary team following  Code Status DNR-Limited    Subjective:  Patient seen awake and alert sitting up in bed.  NGT remains intact. TPN continues to infuse well.  Feels okay, aware of chemotherapy planned for today. No other acute distress is noted, she is very pleasant.    Objective:  Vitals:   02/10/24 2017 02/11/24 0624  BP: (!) 148/79 (!) 148/62  Pulse: 71 71  Resp: 18 18  Temp: 97.7 F (36.5 C) (!) 97.5 F (36.4 C)  SpO2: 97% 96%     Intake/Output Summary (Last 24 hours) at 02/11/2024 1108 Last data filed at 02/11/2024 0940 Gross per 24 hour  Intake 604.11 ml  Output 1550  ml  Net -945.89 ml     REVIEW OF SYSTEMS:   Constitutional: +fatigue, Denies fevers, chills or abnormal night sweats Eyes: Denies blurriness of vision, double vision or watery eyes Ears, nose, mouth, throat, and face: Denies mucositis or sore throat Respiratory: Denies cough, dyspnea or wheezes Cardiovascular: Denies palpitation, chest discomfort or lower extremity swelling Gastrointestinal:  +abdominal distention Skin: Denies abnormal skin rashes Lymphatics: Denies new lymphadenopathy or easy bruising Neurological: Denies numbness, tingling or new weaknesses Behavioral/Psych: Mood is stable, no new changes  All other systems were reviewed with the patient and are negative.  PHYSICAL EXAMINATION: ECOG PERFORMANCE STATUS: 2 - Symptomatic, <50% confined to bed  Vitals:   02/10/24 2017 02/11/24 0624  BP: (!) 148/79 (!) 148/62  Pulse: 71 71  Resp: 18 18  Temp: 97.7 F (36.5 C) (!) 97.5 F (36.4 C)  SpO2: 97% 96%   Filed Weights   02/10/24 0437 02/11/24 0632 02/11/24 0900  Weight: 125 lb (56.7 kg) 118 lb 14.4 oz (53.9 kg) 118 lb 14.4 oz (53.9 kg)    GENERAL: alert, no distress and comfortable +NGT intact SKIN: +pale skin color, texture, turgor are normal, no rashes or significant lesions EYES: normal, conjunctiva are pink and non-injected, sclera clear OROPHARYNX: no exudate, no erythema and lips, buccal mucosa, and tongue normal  NECK: supple, thyroid normal size, non-tender, without nodularity LYMPH: 1.5-2 cm mobile left axillary node LUNGS: clear to auscultation and percussion with normal breathing effort HEART: regular rate & rhythm and no  murmurs and no lower extremity edema ABDOMEN: abdomen soft, mildly distended, non-tender and normal bowel sounds MUSCULOSKELETAL: no cyanosis of digits and no clubbing  PSYCH: alert & oriented x 3 with fluent speech NEURO: no focal motor/sensory deficits   All questions were answered. The patient knows to call the clinic with any  problems, questions or concerns.   The total time spent in the appointment was 60 minutes encounter with patient including review of chart and various tests results, discussions about plan of care and coordination of care plan  Dawson Bills, NP 02/11/2024 11:08 AM    Labs Reviewed:  Lab Results  Component Value Date   WBC 9.6 02/11/2024   HGB 10.7 (L) 02/11/2024   HCT 33.5 (L) 02/11/2024   MCV 98.0 02/11/2024   PLT 304 02/11/2024   Recent Labs    02/07/24 0449 02/08/24 0519 02/09/24 0419 02/10/24 0345 02/11/24 0337  NA 141   < > 141 140 141  K 3.7   < > 3.8 3.9 4.2  CL 106   < > 107 109 111  CO2 26   < > 26 25 22   GLUCOSE 86   < > 118* 124* 130*  BUN 18   < > 21 24* 28*  CREATININE 1.06*   < > 0.92 0.87 0.84  CALCIUM 8.6*   < > 8.3* 8.2* 8.7*  GFRNONAA 51*   < > >60 >60 >60  PROT 5.4*  --  5.6* 5.6*  --   ALBUMIN 2.5*  --  2.5* 2.2*  --   AST 16  --  28 31  --   ALT 13  --  20 24  --   ALKPHOS 69  --  75 90  --   BILITOT 0.5  --  0.4 0.3  --    < > = values in this interval not displayed.    Studies Reviewed:  DG Abd 1 View Result Date: 02/10/2024 CLINICAL DATA:  14782 Abdominal distention [id number] [phone] SBO (small bowel obstruction) (HCC) [id number] EXAM: ABDOMEN - 1 VIEW COMPARISON:  02/08/2024 FINDINGS: NG tube terminates within the proximal stomach. Worsening small bowel dilation in the mid abdomen measuring up to 5.3 cm (previously 3.3 cm. Paucity of bowel gas within the colon. No gross free intraperitoneal air. IMPRESSION: 1. Worsening small bowel obstruction. 2. NG tube terminates within the proximal stomach. Recommend advancement. Electronically Signed   By: Duanne Guess D.O.   On: 02/10/2024 09:35   DG Chest 1 View Result Date: 02/08/2024 CLINICAL DATA:  PICC line placement EXAM: CHEST  1 VIEW COMPARISON:  02/05/2024 FINDINGS: Right PICC line in place with the tip in the SVC. NG tube is in the stomach. Heart and mediastinal contours within normal limits. No  confluent opacities or effusions. No acute bony abnormality. IMPRESSION: Right PICC line tip in the SVC. No active cardiopulmonary disease. Electronically Signed   By: Charlett Nose M.D.   On: 02/08/2024 17:51   [place] EKG SITE RITE Result Date: 02/08/2024 If Site Rite image not attached, placement could not be confirmed due to current cardiac rhythm.  DG Abd Portable 1V Result Date: 02/08/2024 CLINICAL DATA:  Check gastric catheter placement EXAM: PORTABLE ABDOMEN - 1 VIEW COMPARISON:  02/04/2024 FINDINGS: Scattered large and small bowel gas is noted. Mild small bowel dilatation is noted significantly improved when compared with the prior exam. Gastric catheter is noted with the tip in the stomach. No free air is noted. IMPRESSION: Persistent but significantly improved  small bowel dilatation when compared with the prior exam. Electronically Signed   By: Alcide Clever M.D.   On: 02/08/2024 09:34   US Paracentesis Result Date: 02/05/2024 INDICATION: 89 year old with new abdominal distention, ascites. Request made for diagnostic and therapeutic paracentesis. EXAM: ULTRASOUND GUIDED DIAGNOSTIC  PARACENTESIS MEDICATIONS: 10 mL 1% lidocaine. COMPLICATIONS: None immediate. PROCEDURE: Informed written consent was obtained from the patient after a discussion of the risks, benefits and alternatives to treatment. A timeout was performed prior to the initiation of the procedure. Initial ultrasound scanning demonstrates a small amount of ascites within the left lateral abdomen interlooped with bowel. The left lateral abdomen was prepped and draped in the usual sterile fashion. 1% lidocaine was used for local anesthesia. Following this, a 19 gauge, 7-cm, Yueh catheter was introduced. An ultrasound image was saved for documentation purposes. The paracentesis was performed. The catheter was removed and a dressing was applied. The patient tolerated the procedure well without immediate post procedural complication. FINDINGS: A  total of approximately 100 mL of yellow fluid was removed. Samples were sent to the laboratory as requested by the clinical team. IMPRESSION: Successful ultrasound-guided paracentesis yielding 100 mL of peritoneal fluid. Performed by: Loyce Dys PA-C Electronically Signed   By: Malachy Moan M.D.   On: 02/05/2024 14:31   US PELVIC COMPLETE WITH TRANSVAGINAL Result Date: 02/05/2024 CLINICAL DATA:  89 year old female with ascites, peritoneal carcinomatosis on CT Abdomen and Pelvis yesterday. EXAM: TRANSABDOMINAL AND TRANSVAGINAL ULTRASOUND OF PELVIS TECHNIQUE: Both transabdominal and transvaginal ultrasound examinations of the pelvis were performed. Transabdominal technique was performed for global imaging of the pelvis including uterus, ovaries, adnexal regions, and pelvic cul-de-sac. It was necessary to proceed with endovaginal exam following the transabdominal exam to visualize the ovaries. COMPARISON:  CT Abdomen and Pelvis 02/04/2024. FINDINGS: Uterus Measurements: 6.9 x 3.9 x 3.9 cm = volume: 41 mL. No obvious myometrial mass. Endometrium Heterogeneous hypoechoic area at the fundus endometrium (image 49 of series 1). This area encompasses about 2 cm (image 55). See also cine series 3 images. Otherwise the echogenic endometrial stripe is up to 5 mm. Right ovary Measurements: 3.5 x 2.1 x 2.8 cm = volume: 11 mL. No ovarian mass identified. Left ovary Measurements: 2.3 x 1.5 x 1.7 cm = volume: 3 mL. No ovarian mass identified. Other findings Small volume ascites again visible in the pelvis. IMPRESSION: 1. No ovarian mass identified by ultrasound. 2. There is a 2 cm heterogeneous area at the uterine fundus which is favored to be endometrial related. Elsewhere the endometrial stripe is about 5 mm. An Endometrial Malignancy is not excluded. 3. Ascites, in conjunction with the extensive additional abdominal and pelvic abnormalities demonstrated by CT yesterday. Electronically Signed   By: Odessa Fleming M.D.   On:  02/05/2024 12:17   DG Chest Portable 1 View Result Date: 02/05/2024 CLINICAL DATA:  NG tube replacement confirmation EXAM: PORTABLE CHEST - 1 VIEW COMPARISON:  02/04/2024 FINDINGS: Cardiomediastinal silhouette and pulmonary vasculature are within normal limits. Lungs are clear. Nasogastric tube extends to the left upper quadrant the side hole located at the level of the gastroesophageal junction. It is slightly retracted compared to prior exam. IMPRESSION: Nasogastric tube terminates in the left upper quadrant in the expected site of the stomach. The side hole of the NG tube is located at the level of the gastroesophageal junction. Electronically Signed   By: Acquanetta Belling M.D.   On: 02/05/2024 07:38   DG Chest Portable 1 View Result Date:  02/04/2024 CLINICAL DATA:  NG tube placement. Abdominal discomfort, heartburn, and nausea. EXAM: PORTABLE CHEST 1 VIEW COMPARISON:  None Available. FINDINGS: The heart size and mediastinal contours are within normal limits. There is atherosclerotic calcification of the aorta. No consolidation, effusion, or pneumothorax. An enteric tube terminates in the stomach. No acute osseous abnormality. IMPRESSION: 1. No active disease. 2. Enteric tube terminates in the stomach. Electronically Signed   By: Thornell Sartorius M.D.   On: 02/04/2024 21:20   CT ABDOMEN PELVIS W CONTRAST Result Date: 02/04/2024 CLINICAL DATA:  Abdominal pain, bloating, nausea and vomiting for 2 weeks. * Tracking Code: BO * EXAM: CT ABDOMEN AND PELVIS WITH CONTRAST TECHNIQUE: Multidetector CT imaging of the abdomen and pelvis was performed using the standard protocol following bolus administration of intravenous contrast. RADIATION DOSE REDUCTION: This exam was performed according to the departmental dose-optimization program which includes automated exposure control, adjustment of the mA and/or kV according to patient size and/or use of iterative reconstruction technique. CONTRAST:  OMNIPAQUE IOHEXOL 300  MG/ML  SOLN COMPARISON:  None Available. FINDINGS: Lower Chest: No acute findings. Hepatobiliary: A few tiny hepatic cysts are seen, however, there are multiple hypovascular masses involving the right and left hepatic lobes, largest in the central right hepatic lobe measuring 1.9 x 1.5 cm on image 26/2. These are consistent with liver metastases. Gallbladder is unremarkable. No evidence of biliary ductal dilatation. Pancreas:  No mass or inflammatory changes. Spleen: Within normal limits in size and appearance. Adrenals/Urinary Tract: No suspicious masses identified. No evidence of ureteral calculi or hydronephrosis. Stomach/Bowel: Distal small bowel obstruction, with transition point in the right lower quadrant, likely due to peritoneal carcinoma. Rectal intussusception incidentally noted. Vascular/Lymphatic: Mild retroperitoneal lymphadenopathy in the left para-aortic and aortocaval spaces. No acute vascular findings. Reproductive: A 1.6 cm peripherally calcified fibroid is seen in the uterine fundus. Poorly defined masslike soft tissue prominence is seen in the region of the cervix and lower uterine segment measuring 3.6 x 3.3 cm on image 63/2, possibly representing a primary cervical or endometrial carcinoma. Poorly defined soft tissue density is seen in both adnexal regions and along the uterine fundus, consistent with peritoneal carcinomatosis. Other: Mild ascites is seen. Peritoneal and omental soft tissue nodules and masses are seen throughout the abdomen and pelvis, consistent with peritoneal carcinomatosis. Largest conglomerate mass is seen in the right abdomen measuring approximately 6.7 x 3.4 cm on image 37/2. Musculoskeletal:  No suspicious bone lesions identified. IMPRESSION: Diffuse peritoneal carcinomatosis and mild ascites. Distal small bowel obstruction, with transition point in the right lower quadrant, likely due to peritoneal carcinomatosis. Masslike soft tissue prominence in the region of the  lower uterine segment and cervix, possibly representing primary cervical or endometrial carcinoma. Liver metastases. Mild retroperitoneal lymphadenopathy, consistent with metastatic disease. Rectal intussusception incidentally noted. No definite lead mass identified. Electronically Signed   By: Danae Orleans M.D.   On: 02/04/2024 11:34   Ms. 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  She is admitted with a bowel obstruction, likely secondary to carcinomatosis.  The primary tumor site is unclear.  The differential diagnosis occludes ovarian cancer, uterine cancer, fallopian tube carcinoma, and primary peritoneal carcinoma.  She is followed by the surgical and gynecologic oncology services.  I discussed the case with Dr. Pricilla Holm this morning.  Dr. Pricilla Holm recommends proceeding with systemic therapy in hopes of alleviating the bowel obstruction.  I reviewed treatment options with Ms. [employer]  and her family.  She would like to proceed with chemotherapy.  I recommend paclitaxel/carboplatin given on a 3-week schedule.  We will consider adding immunotherapy or bevacizumab based on results of additional staging and tumor tissue testing.  We reviewed potential toxicities associated with the paclitaxel/carboplatin regimen including the chance of nausea, alopecia, hematologic toxicity, infection, and bleeding.  We discussed the allergic reaction and neuropathy associated with paclitaxel.  She agrees to proceed.  A right PICC is in place.  This will be used for cycle 1 chemotherapy.  We will consider referring her for Port-A-Cath placement for subsequent chemotherapy.  Ms. Lycan will receive cycle 1 as an inpatient on 02/12/2024.  A treatment plan was entered today.  Recommendations: Continue management of the small bowel obstruction per surgery and GYN oncology Continue TPN Staging pelvic MRI and chest  CT Cycle 1 paclitaxel/carboplatin 02/12/2024

## 2024-02-12 ENCOUNTER — Inpatient Hospital Stay (HOSPITAL_COMMUNITY): Payer: Medicare PPO

## 2024-02-12 DIAGNOSIS — R4589 Other symptoms and signs involving emotional state: Secondary | ICD-10-CM | POA: Diagnosis not present

## 2024-02-12 DIAGNOSIS — C801 Malignant (primary) neoplasm, unspecified: Secondary | ICD-10-CM | POA: Diagnosis not present

## 2024-02-12 DIAGNOSIS — Z86711 Personal history of pulmonary embolism: Secondary | ICD-10-CM

## 2024-02-12 DIAGNOSIS — Z515 Encounter for palliative care: Secondary | ICD-10-CM | POA: Diagnosis not present

## 2024-02-12 DIAGNOSIS — Z66 Do not resuscitate: Secondary | ICD-10-CM | POA: Diagnosis not present

## 2024-02-12 DIAGNOSIS — I82452 Acute embolism and thrombosis of left peroneal vein: Secondary | ICD-10-CM | POA: Diagnosis not present

## 2024-02-12 DIAGNOSIS — C786 Secondary malignant neoplasm of retroperitoneum and peritoneum: Secondary | ICD-10-CM | POA: Diagnosis not present

## 2024-02-12 DIAGNOSIS — C787 Secondary malignant neoplasm of liver and intrahepatic bile duct: Secondary | ICD-10-CM | POA: Diagnosis not present

## 2024-02-12 DIAGNOSIS — K56609 Unspecified intestinal obstruction, unspecified as to partial versus complete obstruction: Secondary | ICD-10-CM | POA: Diagnosis not present

## 2024-02-12 LAB — BASIC METABOLIC PANEL
Anion gap: 7 (ref 5–15)
BUN: 27 mg/dL — ABNORMAL HIGH (ref 8–23)
CO2: 22 mmol/L (ref 22–32)
Calcium: 8.2 mg/dL — ABNORMAL LOW (ref 8.9–10.3)
Chloride: 110 mmol/L (ref 98–111)
Creatinine, Ser: 0.73 mg/dL (ref 0.44–1.00)
GFR, Estimated: >60 mL/min (ref >60)
Glucose, Bld: 105 mg/dL — ABNORMAL HIGH (ref 70–99)
Potassium: 4 mmol/L (ref 3.5–5.1)
Sodium: 139 mmol/L (ref 135–145)

## 2024-02-12 LAB — CBC
HCT: 31.7 % — ABNORMAL LOW (ref 36.0–46.0)
Hemoglobin: 10.1 g/dL — ABNORMAL LOW (ref 12.0–15.0)
MCH: 30.8 pg (ref 26.0–34.0)
MCHC: 31.9 g/dL (ref 30.0–36.0)
MCV: 96.6 fL (ref 80.0–100.0)
Platelets: 318 10*3/uL (ref 150–400)
RBC: 3.28 MIL/uL — ABNORMAL LOW (ref 3.87–5.11)
RDW: 12.4 % (ref 11.5–15.5)
WBC: 11.2 10*3/uL — ABNORMAL HIGH (ref 4.0–10.5)
nRBC: 0 % (ref 0.0–0.2)

## 2024-02-12 LAB — HEPARIN LEVEL (UNFRACTIONATED)
Heparin Unfractionated: 0.65 [IU]/mL (ref 0.30–0.70)
Heparin Unfractionated: 0.58 [IU]/mL (ref 0.30–0.70)

## 2024-02-12 LAB — GLUCOSE, CAPILLARY
Glucose-Capillary: 113 mg/dL — ABNORMAL HIGH (ref 70–99)
Glucose-Capillary: 142 mg/dL — ABNORMAL HIGH (ref 70–99)

## 2024-02-12 LAB — MAGNESIUM: Magnesium: 2 mg/dL (ref 1.7–2.4)

## 2024-02-12 MED ORDER — TRAVASOL 10 % IV SOLN
INTRAVENOUS | Status: AC
  Filled 2024-02-12: qty 840

## 2024-02-12 MED ORDER — FILGRASTIM-AAFI 300 MCG/0.5ML IJ SOSY
300.0000 ug | PREFILLED_SYRINGE | Freq: Every day | INTRAMUSCULAR | Status: DC
  Filled 2024-02-12: qty 0.5

## 2024-02-12 NOTE — Progress Notes (Signed)
 PHARMACY - ANTICOAGULATION CONSULT NOTE  Pharmacy Consult for IV Heparin Indication: pulmonary embolus  Allergies  Allergen Reactions   Calcium-Containing Compounds Other (See Comments)    Upsets the stomach, so it is not taken very often    Patient Measurements: Height: 5\' 4"  (162.6 cm) Weight: 53.9 kg (118 lb 14.4 oz) IBW/kg (Calculated) : 54.7  Vital Signs: Temp: 97.8 F (36.6 C) (02/14 2324) Temp Source: Oral (02/14 2324) BP: 126/76 (02/14 2324) Pulse Rate: 64 (02/14 2324)  Labs: Recent Labs    02/10/24 0345 02/11/24 0337 02/12/24 0230  HGB 10.7* 10.7* 10.1*  HCT 32.6* 33.5* 31.7*  PLT 297 304 318  HEPARINUNFRC  --   --  0.65  CREATININE 0.87 0.84 0.73    Estimated Creatinine Clearance: 43 mL/min (by C-G formula based on SCr of 0.73 mg/dL).   Medical History: Past Medical History:  Diagnosis Date   Cancer (HCC)    skin cancer leg -no problems now   GERD (gastroesophageal reflux disease)    mild -no meds   Heart murmur    "faint"   Hypertension     Medications:  Medications Prior to Admission  Medication Sig Dispense Refill Last Dose/Taking   calcium-vitamin D (OSCAL WITH D) 500-200 MG-UNIT per tablet Take 1 tablet by mouth every 7 (seven) days.   Past Week   Cholecalciferol (VITAMIN D3) 1000 units CAPS Take 1,000 Units by mouth daily with breakfast.   02/03/2024 Morning   clotrimazole (ANTI-FUNGAL) 1 % cream Apply 1 Application topically daily as needed (for any yeast issues- affected areas).   Taking As Needed   losartan (COZAAR) 100 MG tablet Take 100 mg by mouth every morning.   02/03/2024    Assessment: Pharmacy is consulted to start heparin drip in 89 yo female diagnosed with acute PE. CT of chest shows Incidental acute pulmonary embolism involving the distal right pulmonary artery and lobar and segmental right upper lobe and right middle lobe pulmonary artery branches. No saddle embolus. Normal caliber main pulmonary artery. Borderline elevated RV/LV  Ratio = 1.0. PMH includes primary cervical or endometrial carcinoma with liver mets and plan is to started on chemotherapy. Pt has been on enoxaparin 40 mg daily with LD being on 2/14 at 0931.    Today, 02/12/24 Heparin level = 0.65 (therapeutic) with heparin gtt @ 850 units/hr No complications of therapy noted Hgb 10.1, plt wnl SCr 0.73 mg/dl, Crcl 43 ml/min    Goal of Therapy:  Heparin level 0.3-0.7 units/ml Monitor platelets by anticoagulation protocol: Yes   Plan:  Continue heparin gtt @  850 units/hr  Check heparin level in 8 hr to confirm therapeutic dose Daily CBC while on heparin Monitor for signs and symptoms of bleeding   Terrilee Files, PharmD 02/12/2024 3:20 AM

## 2024-02-12 NOTE — Plan of Care (Signed)
  Problem: Clinical Measurements: Goal: Will remain free from infection Outcome: Progressing   Problem: Activity: Goal: Risk for activity intolerance will decrease Outcome: Progressing   Problem: Safety: Goal: Ability to remain free from injury will improve Outcome: Progressing   Problem: Skin Integrity: Goal: Risk for impaired skin integrity will decrease Outcome: Progressing

## 2024-02-12 NOTE — Progress Notes (Signed)
 PROGRESS NOTE    Tracy Preston  ZOX:[id number] DOB: 07/08/1935 DOA: 02/04/2024 PCP: Marden Noble, MD (Inactive)    Brief Narrative:  89 year old female with a history of hypertension, chronic kidney disease, family history of breast cancer who presents with abdominal distention and vomiting for approximately 2 weeks presenting to [facility] emergency department by her outpatient provider after CT scan of the abdomen is performed and is found to have findings concerning for new metastatic gynecological malignancy and small bowel obstruction.  Workup in the hospital showed concerning new onset peritoneal carcinomatosis likely GYN malignancy. General surgery and GYN oncology following.  Had pelvic MRI and CT chest.  Pelvic MRI clearly shows advanced GYN malignancy.  CT chest with contrast showed distal pulmonary embolism without cor pulmonale.  Started on heparin drip.  There is evidence of metastatic disease as previously demonstrated.   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  Oncology and GYN oncology has seen the patient and planning to further delineate anatomy with MRI pelvis, CT chest. - Plans to start chemotherapy today - MRI pelvis, CT chest-reviewed.  Showing pulmonary embolism. - Continue steroids for malignant bowel bowel obstruction.  If necessary later we can add octreotide.  In the future we can also discuss G-tube placement depending on her response to chemotherapy. -Family understands that overall she can have poor prognosis and  we may have to shift our focus towards comfort care depending on her tolerance to the treatment planned above.  Right-sided distal pulmonary embolism without cor pulmonale - Currently on heparin drip.  This is likely from underlying malignancy - Lower extremity Dopplers  Poor nutritional status Severe protein calorie malnutrition - TPN  Essential hypertension -Losartan.  IV as needed  GERD (gastroesophageal reflux disease) PPI  Anemia -Anemia of chronic disease.   DVT prophylaxis: Heparin drip    Code Status: Do not attempt resuscitation (DNR) PRE-ARREST INTERVENTIONS DESIRED Family Communication: Family members up to date Status is: Inpatient Remains inpatient appropriate because: Continue hospital stay for SBO management    Subjective: Feels okay no complaints.  Examination:  General exam: Appears calm and comfortable ; NGT in place.  Respiratory system: Clear to auscultation. Respiratory effort normal. Cardiovascular system: S1 & S2 heard, RRR. No JVD, murmurs, rubs, gallops or clicks. No pedal edema. Gastrointestinal system: Abdomen is nondistended, soft and nontender. No organomegaly or masses felt. Normal bowel sounds heard. Central nervous system: Alert and oriented. No focal neurological deficits. Extremities: Symmetric 5 x 5 power. Skin: No rashes, lesions or ulcers Psychiatry: Judgement and insight appear normal. Mood & affect appropriate.                Diet Orders (From admission, onward)     Start     Ordered   02/11/24 1525  Diet NPO time specified Except for: Ice Chips  Diet effective now       Question:  Except for  Answer:  Ice Chips   02/11/24 1524            Objective: Vitals:   02/11/24 1517 02/11/24 2007 02/11/24 2324 02/12/24 0456  BP: 121/70  137/69 126/76 (!) 150/70  Pulse: 73 67 64 73  Resp: 16 18  16   Temp: 97.8 F (36.6 C) (!) 97.3 F (36.3 C) 97.8 F (36.6 C) 97.7 F (36.5 C)  TempSrc: Oral Oral Oral Oral  SpO2:   95% 95% 95%  Weight:      Height:        Intake/Output Summary (Last 24 hours) at 02/12/2024 1307 Last data filed at 02/12/2024 0452 Gross per 24 hour  Intake 707.61 ml  Output 401 ml  Net 306.61 ml   Filed Weights   02/10/24 0437 02/11/24 0632 02/11/24 0900  Weight: 56.7 kg 53.9 kg 53.9 kg    Scheduled Meds:  CARBOplatin  300 mg Intravenous Once   Chlorhexidine Gluconate Cloth  6 each Topical Daily   [START ON 02/13/2024] dexamethasone (DECADRON) injection  8 mg Intravenous Daily   [START ON 02/13/2024] filgastrim (NIVESTYM) SQ  300 mcg Subcutaneous Daily   insulin aspart  0-9 Units Subcutaneous Q8H   losartan  100 mg Per Tube q morning   pantoprazole (PROTONIX) IV  40 mg Intravenous Q12H   sodium chloride flush  10-40 mL Intracatheter Q12H   Continuous Infusions:  heparin 850 Units/hr (02/12/24 0351)   TPN ADULT (ION) 70 mL/hr at 02/12/24 0351   TPN ADULT (ION)      Nutritional status Signs/Symptoms: severe fat depletion, moderate fat depletion Interventions: TPN Body mass index is 20.41 kg/m.  Data Reviewed:   CBC: Recent Labs  Lab 02/06/24 0526 02/07/24 0449 02/08/24 0519 02/09/24 0419 02/10/24 0345 02/11/24 0337 02/12/24 0230  WBC 6.6 7.2 7.9 8.0 8.3 9.6 11.2*  NEUTROABS 4.7 5.2  --   --   --   --   --   HGB 9.9* 10.8* 10.8* 10.5* 10.7* 10.7* 10.1*  HCT 31.6* 34.3* 33.8* 33.5* 32.6* 33.5* 31.7*  MCV 96.6 96.9 95.5 98.5 96.2 98.0 96.6  PLT 262 288 288 305 297 304 318   Basic Metabolic Panel: Recent Labs  Lab 02/08/24 0519 02/09/24 0419 02/10/24 0345 02/11/24 0337 02/12/24 0230  NA 138 141 140 141 139  K 3.5 3.8 3.9 4.2 4.0  CL 104 107 109 111 110  CO2 24 26 25 22 22   GLUCOSE 102* 118* 124* 130* 105*  BUN 21 21 24* 28* 27*  CREATININE 0.95 0.92 0.87 0.84 0.73  CALCIUM 8.5* 8.3* 8.2* 8.7* 8.2*  MG 1.8 2.0 1.9 2.0 2.0  PHOS 2.9 2.3* 3.3  --   --    GFR: Estimated Creatinine Clearance: 43 mL/min (by C-G formula based on SCr of 0.73  mg/dL). Liver Function Tests: Recent Labs  Lab 02/06/24 0526 02/07/24 0449 02/09/24 0419 02/10/24 0345  AST 15 16 28 31   ALT 15 13 20 24   ALKPHOS 62 69 75 90  BILITOT 0.5 0.5 0.4 0.3  PROT 5.3* 5.4* 5.6* 5.6*  ALBUMIN 2.4* 2.5* 2.5* 2.2*   No results for input(s): "LIPASE", "AMYLASE" in the last 168 hours. No results for input(s): "AMMONIA" in the last 168 hours. Coagulation Profile: No results for input(s): "INR", "PROTIME" in the last 168 hours. Cardiac Enzymes: No results for input(s): "CKTOTAL", "CKMB", "CKMBINDEX", "TROPONINI" in the last 168 hours. BNP (last 3 results) No results for input(s): "PROBNP" in the last 8760 hours. HbA1C: No results for input(s): "HGBA1C" in the last 72 hours. CBG: Recent Labs  Lab 02/10/24 2350 02/11/24 0834 02/11/24 1612 02/11/24 2341 02/12/24 0724  GLUCAP 163* 112* 141* 111* 113*   Lipid Profile: No  results for input(s): "CHOL", "HDL", "LDLCALC", "TRIG", "CHOLHDL", "LDLDIRECT" in the last 72 hours. Thyroid Function Tests: No results for input(s): "TSH", "T4TOTAL", "FREET4", "T3FREE", "THYROIDAB" in the last 72 hours. Anemia Panel: No results for input(s): "VITAMINB12", "FOLATE", "FERRITIN", "TIBC", "IRON", "RETICCTPCT" in the last 72 hours. Sepsis Labs: No results for input(s): "PROCALCITON", "LATICACIDVEN" in the last 168 hours.  Recent Results (from the past 240 hours)  Body fluid culture w Gram Stain     Status: None   Collection Time: 02/05/24  2:14 PM   Specimen: PATH Cytology Pleural fluid  Result Value Ref Range Status   Specimen Description   Final    PLEURAL Performed at [facility], [address], [place], [place] [address]    Special Requests   Final    NONE Performed at [facility], [address] [place], [place] [address]    Gram Stain WBC SEEN NO ORGANISMS SEEN CYTOSPIN SMEAR   Final   Culture   Final    NO GROWTH 3 DAYS Performed at [facility],  [address] [place], [place] [address]    Report Status 02/09/2024 FINAL  Final         Radiology Studies: CT CHEST W CONTRAST Addendum Date: 02/11/2024 ADDENDUM REPORT: 02/11/2024 15:54 ADDENDUM: Critical Value/emergent results were called by telephone at the time of interpretation on 02/11/2024 at 3:54 pm to provider Eugene Garnet, MD, who verbally acknowledged these results. Electronically Signed   By: Delbert Phenix M.D.   On: 02/11/2024 15:54   Result Date: 02/11/2024 CLINICAL DATA:  Peritoneal carcinomatosis. Chest staging. * Tracking Code: BO * EXAM: CT CHEST WITH CONTRAST TECHNIQUE: Multidetector CT imaging of the chest was performed during intravenous contrast administration. RADIATION DOSE REDUCTION: This exam was performed according to the departmental dose-optimization program which includes automated exposure control, adjustment of the mA and/or kV according to patient size and/or use of iterative reconstruction technique. CONTRAST:  75mL OMNIPAQUE IOHEXOL 300 MG/ML  SOLN COMPARISON:  02/04/2024 CT abdomen/pelvis. 02/08/2024 chest radiograph. FINDINGS: Cardiovascular: Normal heart size. No significant pericardial effusion/thickening. Left anterior descending coronary atherosclerosis. Right PICC terminates in the middle third of the SVC. Atherosclerotic nonaneurysmal thoracic aorta. Incidental acute pulmonary embolism involving the distal right pulmonary artery and lobar and segmental right upper lobe and right middle lobe pulmonary artery branches. No saddle embolus. Normal caliber main pulmonary artery. RV/LV ratio 1.0. Mediastinum/Nodes: No significant thyroid nodules. Fluid level in the mid to lower thoracic esophagus with enteric tube terminating in the proximal stomach. No right axillary adenopathy. Mild left axillary adenopathy up to 1.1 cm (series 2/image 61). No pathologically enlarged mediastinal or hilar nodes. Lungs/Pleura: No pneumothorax. Small layering right pleural effusion.  No left pleural effusion. Calcified 1 cm posterior right lower lobe granuloma. Mild dependent bilateral lower lobe atelectasis. No acute consolidative airspace disease, lung masses or significant pulmonary nodules. Upper abdomen: Moderate upper abdominal ascites with nodular soft tissue caking in the visualized anterior left upper peritoneum, as seen on recent CT abdomen study. Several hypodense liver lesions scattered throughout the visualized liver, unchanged, largest 1.6 cm in the inferior liver on series 2/image 155. Musculoskeletal: No aggressive appearing focal osseous lesions. Mild thoracic spondylosis. IMPRESSION: 1. Incidental acute pulmonary embolism involving the distal right pulmonary artery and lobar and segmental right upper lobe and right middle lobe pulmonary artery branches. No saddle embolus. Normal caliber main pulmonary artery. Borderline elevated RV/LV Ratio = 1.0. 2. Small layering right pleural effusion. Mild  dependent bilateral lower lobe atelectasis. 3. Mild left axillary adenopathy, cannot exclude metastatic disease. No other evidence of metastatic disease in the chest. 4. One vessel coronary atherosclerosis. 5. Moderate upper abdominal ascites with nodular soft tissue caking in the visualized anterior left upper peritoneum, as seen on recent CT abdomen study, compatible with peritoneal carcinomatosis. Several hypodense liver lesions scattered throughout the visualized liver, unchanged, suspicious for liver metastases. Electronically Signed: By: Delbert Phenix M.D. On: 02/11/2024 15:28   MR PELVIS W WO CONTRAST Result Date: 02/11/2024 CLINICAL DATA:  "Occult malignancy". Peritoneal carcinomatosis on abdominopelvic CT with bowel obstruction. EXAM: MRI PELVIS WITHOUT AND WITH CONTRAST TECHNIQUE: Multiplanar multisequence MR imaging of the pelvis was performed both before and after administration of intravenous contrast. CONTRAST:  5mL GADAVIST GADOBUTROL 1 MMOL/ML IV SOLN COMPARISON:  CT of  02/04/2024 FINDINGS: Urinary Tract: Primarily decompressed urinary bladder. Left renal sinus cysts without hydronephrosis. Bowel: Fluid-filled small bowel loops within the upper and mid pelvis measure up to 2.0 cm on 19/17. Improved from 3.4 cm on the prior exam. Again identified is an intussusception of the sigmoid into the rectum. There is mild hyperenhancement within the lead point including on 41/17, without restricted diffusion in this area. Vascular/Lymphatic: Ileocolic mesenteric adenopathy including at 2.2 x 1.8 cm on 08/17. No pelvic sidewall adenopathy. Reproductive: The endometrium is thickened for age including at 1.0 cm on 18/4. This is followed to the level of the cervix and lower uterine segment, where soft tissue fullness is again identified including on 18/4 and 21/3. Correlate restricted diffusion on 22/11. Example 3.7 x 3.7 cm on 31/17. There is amorphous soft tissue fullness within both adnexa, greater right than left. Example restricted diffusion on 17/11. The right adnexal soft tissue fullness measures 3.3 x 3.3 cm on 18/17. No separate ovarian tissue identified. Other: Peritoneal carcinomatosis, as evidenced by diffuse peritoneal enhancement and restricted diffusion (example 22/11). Small volume pelvic fluid. Musculoskeletal: No acute osseous abnormality. IMPRESSION: 1. Abnormal amorphous soft tissue throughout lower uterine segment and cervix could represent primary uterine/cervical carcinoma or metastatic disease. 2. Diffuse peritoneal carcinomatosis as better imaged on dedicated CT. Ileocolic mesenteric nodal metastasis. 3. Right greater than left adnexal soft tissue fullness likely represents ovarian metastasis. 4. Chronic or recurrent sigmoid/rectal intussusception since 02/04/2024. Vague hyperenhancement in the region of the lead point, without dominant mass. Recommend physical exam correlation to exclude either primary rectosigmoid carcinoma or serosal sigmoid implant lead point. 5.  Small volume pelvic fluid. 6. Improved small bowel dilatation since prior CT. Electronically Signed   By: Jeronimo Greaves M.D.   On: 02/11/2024 15:42           LOS: 8 days   Time spent= 35 mins    Miguel Rota, MD [facility]  If 7PM-7AM, please contact night-coverage  02/12/2024, 1:07 PM

## 2024-02-12 NOTE — Progress Notes (Signed)
 GYN Onc Progress Note  Subjective: Patient reports doing well. + some continued flatus, small bowel movement this morning. Feels less distended. Denies nausea. Denies shortness of breath.    Objective: Vital signs in last 24 hours: Temp:  [97.3 F (36.3 C)-97.8 F (36.6 C)] 97.7 F (36.5 C) (02/15 0456) Pulse Rate:  [64-73] 73 (02/15 0456) Resp:  [16-18] 16 (02/15 0456) BP: (121-150)/(65-76) 150/70 (02/15 0456) SpO2:  [95 %-99 %] 95 % (02/15 0456) Last BM Date : 02/11/24  Intake/Output from previous day: 02/14 0701 - 02/15 0700 In: 737.6 [P.O.:30; I.V.:707.6] Out: 501 [Emesis/NG output:500; Stool:1]  Physical Examination: Gen: NAD, alert and oriented; NGT in place CV: RRR, no murmurs or rubs Pulm: clear to auscultation bilaterally, no wheezes or rhonchi Abd: soft, moderately distended, hypoactive BS, nontender Ext: warm and well perfused, no edema or tenderness to palpation  Labs:    Latest Ref Rng & Units 02/12/2024    2:30 AM 02/11/2024    3:37 AM 02/10/2024    3:45 AM  CBC  WBC 4.0 - 10.5 K/uL 11.2  9.6  8.3   Hemoglobin 12.0 - 15.0 g/dL 78.4  69.6  29.5   Hematocrit 36.0 - 46.0 % 31.7  33.5  32.6   Platelets 150 - 400 K/uL 318  304  297       Latest Ref Rng & Units 02/12/2024    2:30 AM 02/11/2024    3:37 AM 02/10/2024    3:45 AM  BMP  Glucose 70 - 99 mg/dL 284  132  440   BUN 8 - 23 mg/dL 27  28  24    Creatinine 0.44 - 1.00 mg/dL 1.02  7.25  3.66   Sodium 135 - 145 mmol/L 139  141  140   Potassium 3.5 - 5.1 mmol/L 4.0  4.2  3.9   Chloride 98 - 111 mmol/L 110  111  109   CO2 22 - 32 mmol/L 22  22  25    Calcium 8.9 - 10.3 mg/dL 8.2  8.7  8.2    Assessment:  89 y.o. with Stage IV gyn malignancy admitted with a malignant bowel obstruction, plan to start chemotherapy today with dose-reduced carboplatin and taxol. PE diagnosed on staging CT chest yesterday.   Metastatic cancer: Based on my discussion with the patient last night, and reviewing MRI images, I favor  that this represents ovarian or primary peritoneal cancer rather than endometrial.  She has a pathologically enlarged left axillary lymph node which we have discussed potentially biopsying both for additional tissue diagnosis and next generation sequencing. Based on long discussions with the patient and her family and in line with her goals of care, plan to start dose reduced chemotherapy with carboplatin and paclitaxel today in the hopes of symptom improvement.  Malignant bowel obstruction: Plan to continue bowel rest with NG tube in place, GI prophylaxis, and dexamethasone.  Given anticipated prolonged period requiring n.p.o. status, patient was started on TPN last week.  We have discussed the possibility of future G-tube placement to allow the patient to go home from the hospital although I would favor continued conservative management in the hope that chemotherapy may help relieve her malignant obstruction.  She has had some GI function over the last few days already.  Pulmonary embolism: Discussed with the patient hypercoagulable state that cancer causes.  Recommend lower extremity Dopplers and no use of SCDs this has been performed.  Patient started on heparin drip yesterday.  Goal would be to transition  to DOAC once she is able to take p.o. and ready for discharge.   LOS: 8 days    Carver Fila 02/12/2024, 9:12 AM

## 2024-02-12 NOTE — Progress Notes (Signed)
 PHARMACY - ANTICOAGULATION CONSULT NOTE  Pharmacy Consult for heparin  Indication: acute pulmonary embolus  Allergies  Allergen Reactions   Calcium-Containing Compounds Other (See Comments)    Upsets the stomach, so it is not taken very often    Patient Measurements: Height: 5\' 4"  (162.6 cm) Weight: 53.9 kg (118 lb 14.4 oz) IBW/kg (Calculated) : 54.7 Heparin Dosing Weight: 54 kg  Vital Signs: Temp: 97.7 F (36.5 C) (02/15 0456) Temp Source: Oral (02/15 0456) BP: 150/70 (02/15 0456) Pulse Rate: 73 (02/15 0456)  Labs: Recent Labs    02/10/24 0345 02/11/24 0337 02/12/24 0230  HGB 10.7* 10.7* 10.1*  HCT 32.6* 33.5* 31.7*  PLT 297 304 318  HEPARINUNFRC  --   --  0.65  CREATININE 0.87 0.84 0.73    Estimated Creatinine Clearance: 43 mL/min (by C-G formula based on SCr of 0.73 mg/dL).   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 Chest CT on 2/14 showed incidental acute PE in the "the distal right pulmonary artery and lobar and segmental right upper lobe and right middle lobe pulmonary artery branches" with borderline elevated RV/LV ratio.  She's currently on heparin drip for VTE treatment.  Today, 02/12/2024: - confirmatory heparin level collected at 10:39a is therapeutic at 0.58 - cbc stable - no bleeding documented  - she is scheduled to get chemo today   Goal of Therapy:  Heparin level 0.3-0.7 units/ml Monitor platelets by anticoagulation protocol: Yes   Plan:  - continue heparin drip at 850 units/hr - daily heparin level and cbc  - monitor for s/sx bleeding   Rashied Corallo P 02/12/2024,8:19 AM

## 2024-02-12 NOTE — Progress Notes (Signed)
 BLE venous duplex has been completed. Preliminary findings given to Dr. Nelson Chimes.   Results can be found under chart review under CV PROC. 02/12/2024 5:02 PM Shmiel Morton RVT, RDMS

## 2024-02-12 NOTE — Progress Notes (Signed)
 IP PROGRESS NOTE  Subjective:   Tracy Preston reports a small bowel movement this morning and some flatus.  No new complaint.  Her family members are at the bedside.  Objective: Vital signs in last 24 hours: Blood pressure (!) 150/70, pulse 73, temperature 97.7 F (36.5 C), temperature source Oral, resp. rate 16, height 5\' 4"  (1.626 m), weight 118 lb 14.4 oz (53.9 kg), SpO2 95%.  Intake/Output from previous day: 02/14 0701 - 02/15 0700 In: 737.6 [P.O.:30; I.V.:707.6] Out: 501 [Emesis/NG output:500; Stool:1]  Physical Exam:  HEENT: NG tube in place  Abdomen: Mildly distended, no mass, nontender Extremities: No leg edema   Portacath/PICC-without erythema  Lab Results: Recent Labs    02/11/24 0337 02/12/24 0230  WBC 9.6 11.2*  HGB 10.7* 10.1*  HCT 33.5* 31.7*  PLT 304 318    BMET Recent Labs    02/11/24 0337 02/12/24 0230  NA 141 139  K 4.2 4.0  CL 111 110  CO2 22 22  GLUCOSE 130* 105*  BUN 28* 27*  CREATININE 0.84 0.73  CALCIUM 8.7* 8.2*    Lab Results  Component Value Date   CEA1 2.7 02/05/2024   ZOX096 18 02/05/2024    Studies/Results: CT CHEST W CONTRAST Addendum Date: 02/11/2024 ADDENDUM REPORT: 02/11/2024 15:54 ADDENDUM: Critical Value/emergent results were called by telephone at the time of interpretation on 02/11/2024 at 3:54 pm to provider Eugene Garnet, MD, who verbally acknowledged these results. Electronically Signed   By: Delbert Phenix M.D.   On: 02/11/2024 15:54   Result Date: 02/11/2024 CLINICAL DATA:  Peritoneal carcinomatosis. Chest staging. * Tracking Code: BO * EXAM: CT CHEST WITH CONTRAST TECHNIQUE: Multidetector CT imaging of the chest was performed during intravenous contrast administration. RADIATION DOSE REDUCTION: This exam was performed according to the departmental dose-optimization program which includes automated exposure control, adjustment of the mA and/or kV according to patient size and/or use of iterative reconstruction  technique. CONTRAST:  75mL OMNIPAQUE IOHEXOL 300 MG/ML  SOLN COMPARISON:  02/04/2024 CT abdomen/pelvis. 02/08/2024 chest radiograph. FINDINGS: Cardiovascular: Normal heart size. No significant pericardial effusion/thickening. Left anterior descending coronary atherosclerosis. Right PICC terminates in the middle third of the SVC. Atherosclerotic nonaneurysmal thoracic aorta. Incidental acute pulmonary embolism involving the distal right pulmonary artery and lobar and segmental right upper lobe and right middle lobe pulmonary artery branches. No saddle embolus. Normal caliber main pulmonary artery. RV/LV ratio 1.0. Mediastinum/Nodes: No significant thyroid nodules. Fluid level in the mid to lower thoracic esophagus with enteric tube terminating in the proximal stomach. No right axillary adenopathy. Mild left axillary adenopathy up to 1.1 cm (series 2/image 61). No pathologically enlarged mediastinal or hilar nodes. Lungs/Pleura: No pneumothorax. Small layering right pleural effusion. No left pleural effusion. Calcified 1 cm posterior right lower lobe granuloma. Mild dependent bilateral lower lobe atelectasis. No acute consolidative airspace disease, lung masses or significant pulmonary nodules. Upper abdomen: Moderate upper abdominal ascites with nodular soft tissue caking in the visualized anterior left upper peritoneum, as seen on recent CT abdomen study. Several hypodense liver lesions scattered throughout the visualized liver, unchanged, largest 1.6 cm in the inferior liver on series 2/image 155. Musculoskeletal: No aggressive appearing focal osseous lesions. Mild thoracic spondylosis. IMPRESSION: 1. Incidental acute pulmonary embolism involving the distal right pulmonary artery and lobar and segmental right upper lobe and right middle lobe pulmonary artery branches. No saddle embolus. Normal caliber main pulmonary artery. Borderline elevated RV/LV Ratio = 1.0. 2. Small layering right pleural effusion. Mild  dependent bilateral lower  lobe atelectasis. 3. Mild left axillary adenopathy, cannot exclude metastatic disease. No other evidence of metastatic disease in the chest. 4. One vessel coronary atherosclerosis. 5. Moderate upper abdominal ascites with nodular soft tissue caking in the visualized anterior left upper peritoneum, as seen on recent CT abdomen study, compatible with peritoneal carcinomatosis. Several hypodense liver lesions scattered throughout the visualized liver, unchanged, suspicious for liver metastases. Electronically Signed: By: Delbert Phenix M.D. On: 02/11/2024 15:28   MR PELVIS W WO CONTRAST Result Date: 02/11/2024 CLINICAL DATA:  "Occult malignancy". Peritoneal carcinomatosis on abdominopelvic CT with bowel obstruction. EXAM: MRI PELVIS WITHOUT AND WITH CONTRAST TECHNIQUE: Multiplanar multisequence MR imaging of the pelvis was performed both before and after administration of intravenous contrast. CONTRAST:  5mL GADAVIST GADOBUTROL 1 MMOL/ML IV SOLN COMPARISON:  CT of 02/04/2024 FINDINGS: Urinary Tract: Primarily decompressed urinary bladder. Left renal sinus cysts without hydronephrosis. Bowel: Fluid-filled small bowel loops within the upper and mid pelvis measure up to 2.0 cm on 19/17. Improved from 3.4 cm on the prior exam. Again identified is an intussusception of the sigmoid into the rectum. There is mild hyperenhancement within the lead point including on 41/17, without restricted diffusion in this area. Vascular/Lymphatic: Ileocolic mesenteric adenopathy including at 2.2 x 1.8 cm on 08/17. No pelvic sidewall adenopathy. Reproductive: The endometrium is thickened for age including at 1.0 cm on 18/4. This is followed to the level of the cervix and lower uterine segment, where soft tissue fullness is again identified including on 18/4 and 21/3. Correlate restricted diffusion on 22/11. Example 3.7 x 3.7 cm on 31/17. There is amorphous soft tissue fullness within both adnexa, greater right than  left. Example restricted diffusion on 17/11. The right adnexal soft tissue fullness measures 3.3 x 3.3 cm on 18/17. No separate ovarian tissue identified. Other: Peritoneal carcinomatosis, as evidenced by diffuse peritoneal enhancement and restricted diffusion (example 22/11). Small volume pelvic fluid. Musculoskeletal: No acute osseous abnormality. IMPRESSION: 1. Abnormal amorphous soft tissue throughout lower uterine segment and cervix could represent primary uterine/cervical carcinoma or metastatic disease. 2. Diffuse peritoneal carcinomatosis as better imaged on dedicated CT. Ileocolic mesenteric nodal metastasis. 3. Right greater than left adnexal soft tissue fullness likely represents ovarian metastasis. 4. Chronic or recurrent sigmoid/rectal intussusception since 02/04/2024. Vague hyperenhancement in the region of the lead point, without dominant mass. Recommend physical exam correlation to exclude either primary rectosigmoid carcinoma or serosal sigmoid implant lead point. 5. Small volume pelvic fluid. 6. Improved small bowel dilatation since prior CT. Electronically Signed   By: Jeronimo Greaves M.D.   On: 02/11/2024 15:42    Medications: I have reviewed the patient's current medications.  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 CT abdomen/pelvis 02/04/2024: Diffuse carcinomatosis and mild ascites, distal small bowel obstruction, masslike soft tissue prominence in the region of the lower urine segment/cervix, liver metastases, mild retroperitoneal lymphadenopathy, rectal intussusception Ultrasound pelvis 02/05/2024: No ovarian mass, 2 cm heterogenous area of the uterine fundus, ascites Paracentesis 02/05/2024: Adenocarcinoma, cytokeratin 7, PAX8, and p53 positive, consistent with a gynecologic primary Endometrial biopsy 02/07/2024: Negative for endometrial intraepithelial neoplasia and malignancy MRI pelvis 02/11/2024: Abnormal soft tissue at  the uterine segment and cervix, diffuse carcinomatosis, right greater than left adnexal soft tissue fullness felt to represent ovarian metastases, sigmoid/rectal intussusception, small volume pelvic fluid, and's proved small bowel dilation CT chest 02/11/2024: Acute pulmonary embolism of the distal right pulmonary artery and lobar/segmental right upper lobe and right middle lobe pulmonary artery branches, small  layering right pleural effusion, 1.1 cm left axillary lymph node, upper abdominal ascites with nodular soft tissue caking in the left upper peritoneum, hypodense liver lesions Cycle 1 Taxol/carboplatin 02/12/2024 2.  Small bowel obstruction secondary to #1-NG tube placed 3.  Nutrition-TPN 4.  Right sided pulmonary embolism noted on chest CT 02/11/2024: Heparin   Ms. Amsler has been diagnosed with an advanced stage of malignancy consistent with a gynecologic cancer.  The clinical presentation is most consistent with ovarian or primary peritoneal carcinoma.  I discussed the case with Dr. Pricilla Holm.  I reviewed the chest CT and pelvic MRI findings with Ms. Hausler and her family.  She will begin treatment with cycle 1 Taxol/carboplatin today.  Recommendations: Cycle 1 paclitaxel/carboplatin today Start G-CSF 02/13/2024 NG tube-management of bowel obstruction per the surgical service Heparin anticoagulation for the pulmonary embolism, transition to DOAC when taking p.o., follow-up leg Dopplers I will see her 02/14/2024, please call oncology as needed   LOS: 8 days   Thornton Papas, MD   02/12/2024, 9:20 AM

## 2024-02-12 NOTE — Progress Notes (Signed)
 Daily Progress Note   Patient Name: Tracy Preston       Date: 02/12/2024 DOB: [birth year]  Age: 89 y.o. MRN#: [id number] Attending Physician: Miguel Rota, MD Primary Care Physician: Marden Noble, MD (Inactive) Admit Date: 02/04/2024 Length of Stay: 8 days  Reason for Consultation/Follow-up: Establishing goals of care  Subjective:   Reviewed EMR prior to presenting to bedside.  Patient transferred to 6 floor in preparation for chemotherapy.  Patient noted to have small bowel movement today and flatus.  Patient to receive cycle 1 of chemotherapy today.  Patient continues to receive NG tube management for bowel obstruction.  Presented to bedside to see patient.  Patient laying comfortably in bed.  Discussed plan for chemotherapy today.  Patient denies any symptoms of concern at this time.  Noted palliative medicine team to continue to follow along to assist with symptom management as able.  Spent time providing emotional support be active listening.  All questions answered at that time.  Thanked patient for allow me to visit with her today.  Objective:   Vital Signs:  BP 134/67 (BP Location: Right Arm)   Pulse 68   Temp 97.7 F (36.5 C) (Oral)   Resp 18   Ht 5\' 4"  (1.626 m)   Wt 53.9 kg   SpO2 94%   BMI 20.41 kg/m   Physical Exam:  General: NAD, alert Cardiovascular: RRR, no edema in LE b/l Respiratory: no increased work of breathing noted, not in respiratory distress Skin: no rashes or lesions on visible skin Neuro: A&Ox4, following commands easily Psych: appropriately answers all questions  Imaging: I personally reviewed recent imaging.   Assessment & Plan:   Assessment: Patient is a 89 year old female with past medical history of hypertension, CKD, and family history of breast cancer who was admitted on 02/04/2024 for management of abdominal distention and vomiting for approximately 2 weeks.  Imaging concerning for new metastatic gynecological malignancy with small  bowel obstruction.  Imaging also showed likely metastatic disease to liver and peritoneal carcinomatosis.  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  Gyn Onc and oncology consulted for recommendations.  Palliative medicine team consulted to assist with complex medical decision making.   Recommendations/Plan: # Complex medical decision making/goals of care: -Discussed care with patient as detailed above in HPI. Patient proceeding with initial round of chemotherapy today.  Based on outcomes and symptom burden from this, patient can then decide if she would want to proceed with further chemotherapy or discontinue aggressive medical interventions.    -Patient has continued to state that quality of life is incredibly important to her.  Patient's priorities are that her symptoms to be managed and that she not be in pain.                 -  Code Status: Limited: Do not attempt resuscitation (DNR) -DNR-LIMITED -Do Not Intubate/DNI    # Symptom management Patient is receiving these palliative interventions for symptom management with an intent to improve quality of life.                 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  Receiving IV dexamethasone for symptom management.  Should it be needed, would consider addition of octreotide.                                                -  Chales Salmon, Ghoshal A, Dzier?anowski T, Verl Bangs, Sobocki J, Dickman A, Furness K, Winfred, Crawford GB, Lheureux S. [facility] multidisciplinary evidence-based recommendations for the management of malignant bowel obstruction in advanced cancer. Support Care Cancer. 2022 Jun;30(6):4711-4728. doi: 10.1007/s00520-022-06889-8. Epub 2022 Mar 10. PMID: 81191478; PMCID: [id number].   # Psycho-social/Spiritual Support:  - Support System: daughters, son-in-law, 8 grandchildren    #  Discharge Planning:  To Be Determined  Discussed with: Patient, RN  Thank you for allowing the palliative care team to participate in the care [name].  [name], DO Palliative Care Provider PMT # [phone]  If patient remains symptomatic despite maximum doses, please call PMT at [phone] between 0700 and 1900. Outside of these hours, please call attending, as PMT does not have night coverage.

## 2024-02-12 NOTE — Progress Notes (Signed)
 PHARMACY - TOTAL PARENTERAL NUTRITION CONSULT NOTE   Indication: Prolonged ileus   Assessment: 89 year old female with SBO likely in setting of malignancy. CT abd/pelvis with peritoneal carcinomatosis, masslike soft tissue in the ureter/cervix. S/p endometrial biopsy by GynOnc, pathology pending for definitive diagnosis. Pharmacy consulted for TPN management.  Glucose / Insulin: no hx DM - on sSSI q8h (used 1 unit in the past 24 hr) - CBGs ( goal < 150): wnl   - Dexamethasone 8 mg daily x2 doses on 2/13 and 2/14 - dexamethasone 10 mg IV x1 with chemo on 2/15  Electrolytes: WNL, including corrCa Renal: ok Hepatic:  - Alb low at 2.2; LFTs/Tbili WNL I/O:  + 236 mL  -UOP: 2x - emesis/MG output: 500 mL  GI Imaging: - 2/11 Abd xray: persistent but significantly improved small bowel dilatation when compared with prior - 2/13 abd XRAY: Worsening small bowel obstruction.  - 2/14 chest CT: incidental acute PE - 2/14 pelvis MRI: Abnormal amorphous soft tissue throughout lower uterine segment and cervix could represent primary uterine/cervical carcinoma or metastatic disease.Right greater than left adnexal soft tissue fullness likely represents ovarian metastasis. Ileocolic mesenteric nodal metastasis. Chronic or recurrent sigmoid/rectal intussusception since 02/04/2024. GI Surgeries / Procedures: NA  Central access: PICC TPN start date: 2/11  Nutritional Goals: Goal TPN rate is 70 mL/hr (provides 84 g of protein and 1696 kcals per day)  RD Assessment: Estimated Needs Total Energy Estimated Needs: 1625-1900 kcal Total Protein Estimated Needs: 70-90 g Total Fluid Estimated Needs: 46ml/kcal  Current Nutrition: NPO  Plan:  -Continue TPN at 70 mL/hr -Electrolytes in TPN:  Na 50 mEq/L K 40 mEq/L Ca 3 mEq/L Mg 5 mEq/L Phos 15 mmol/L Cl:Ac ratio 1:1 -Add standard MVI and trace elements to TPN -Continue Sensitive q8h SSI and adjust as needed  -Monitor TPN labs on Mon/Thurs at  minimum, more frequently as needed   Lucia Gaskins, PharmD, BCPS Clinical Pharmacist 02/12/2024 8:08 AM

## 2024-02-12 NOTE — Progress Notes (Signed)
 OT Cancellation Note  Patient Details Name: MARKEL MERGENTHALER MRN: [id number] DOB: 1935-10-03   Cancelled Treatment:    Reason Eval/Treat Not Completed: OT screened, no needs identified, will sign off Patient was screened on 2/14 in AM with additional order placed later on same day. Notes indicate patient is still moving independently in room. OT to sign off.  Rosalio Loud, MS Acute Rehabilitation Department Office# [phone]  02/12/2024, 7:59 AM

## 2024-02-13 ENCOUNTER — Inpatient Hospital Stay (HOSPITAL_COMMUNITY): Payer: Medicare PPO

## 2024-02-13 DIAGNOSIS — I428 Other cardiomyopathies: Secondary | ICD-10-CM | POA: Diagnosis not present

## 2024-02-13 DIAGNOSIS — Z515 Encounter for palliative care: Secondary | ICD-10-CM | POA: Diagnosis not present

## 2024-02-13 DIAGNOSIS — K56609 Unspecified intestinal obstruction, unspecified as to partial versus complete obstruction: Secondary | ICD-10-CM | POA: Diagnosis not present

## 2024-02-13 DIAGNOSIS — E440 Moderate protein-calorie malnutrition: Secondary | ICD-10-CM | POA: Diagnosis not present

## 2024-02-13 DIAGNOSIS — Z7189 Other specified counseling: Secondary | ICD-10-CM | POA: Diagnosis not present

## 2024-02-13 DIAGNOSIS — R4589 Other symptoms and signs involving emotional state: Secondary | ICD-10-CM | POA: Diagnosis not present

## 2024-02-13 LAB — ECHOCARDIOGRAM COMPLETE
AR max vel: 1.61 cm2
AV Area VTI: 1.23 cm2
AV Area mean vel: 1.25 cm2
AV Mean grad: 9 mm[Hg]
AV Peak grad: 11.1 mm[Hg]
Ao pk vel: 1.67 m/s
Area-P 1/2: 3.27 cm2
Height: 64 in
P 1/2 time: 299 ms
S' Lateral: 2.6 cm
Weight: 1902.41 [oz_av]

## 2024-02-13 LAB — GLUCOSE, CAPILLARY
Glucose-Capillary: 106 mg/dL — ABNORMAL HIGH (ref 70–99)
Glucose-Capillary: 113 mg/dL — ABNORMAL HIGH (ref 70–99)
Glucose-Capillary: 132 mg/dL — ABNORMAL HIGH (ref 70–99)
Glucose-Capillary: 142 mg/dL — ABNORMAL HIGH (ref 70–99)

## 2024-02-13 LAB — BASIC METABOLIC PANEL
Anion gap: 6 (ref 5–15)
BUN: 31 mg/dL — ABNORMAL HIGH (ref 8–23)
CO2: 23 mmol/L (ref 22–32)
Calcium: 8 mg/dL — ABNORMAL LOW (ref 8.9–10.3)
Chloride: 110 mmol/L (ref 98–111)
Creatinine, Ser: 0.73 mg/dL (ref 0.44–1.00)
GFR, Estimated: >60 mL/min (ref >60)
Glucose, Bld: 115 mg/dL — ABNORMAL HIGH (ref 70–99)
Potassium: 4.4 mmol/L (ref 3.5–5.1)
Sodium: 139 mmol/L (ref 135–145)

## 2024-02-13 LAB — CBC
HCT: 31.8 % — ABNORMAL LOW (ref 36.0–46.0)
Hemoglobin: 9.9 g/dL — ABNORMAL LOW (ref 12.0–15.0)
MCH: 30.7 pg (ref 26.0–34.0)
MCHC: 31.1 g/dL (ref 30.0–36.0)
MCV: 98.5 fL (ref 80.0–100.0)
Platelets: 329 10*3/uL (ref 150–400)
RBC: 3.23 MIL/uL — ABNORMAL LOW (ref 3.87–5.11)
RDW: 12.5 % (ref 11.5–15.5)
WBC: 10.3 10*3/uL (ref 4.0–10.5)
nRBC: 0 % (ref 0.0–0.2)

## 2024-02-13 LAB — HEPARIN LEVEL (UNFRACTIONATED)
Heparin Unfractionated: 0.68 [IU]/mL (ref 0.30–0.70)
Heparin Unfractionated: 0.78 [IU]/mL — ABNORMAL HIGH (ref 0.30–0.70)

## 2024-02-13 LAB — MAGNESIUM: Magnesium: 2.2 mg/dL (ref 1.7–2.4)

## 2024-02-13 MED ORDER — TBO-FILGRASTIM 300 MCG/0.5ML ~~LOC~~ SOSY
300.0000 ug | PREFILLED_SYRINGE | Freq: Every day | SUBCUTANEOUS | Status: DC
  Administered 2024-02-13 – 2024-02-16 (×4): 300 ug via SUBCUTANEOUS
  Filled 2024-02-13 (×5): qty 0.5

## 2024-02-13 MED ORDER — TRAVASOL 10 % IV SOLN
INTRAVENOUS | Status: AC
  Filled 2024-02-13: qty 840

## 2024-02-13 NOTE — Progress Notes (Signed)
 PROGRESS NOTE    Tracy Preston  ZOX:[id number] DOB: 1935-05-12 DOA: 02/04/2024 PCP: Marden Noble, MD (Inactive)    Brief Narrative:  89 year old female with a history of hypertension, chronic kidney disease, family history of breast cancer who presents with abdominal distention and vomiting for approximately 2 weeks presenting to [facility] emergency department by her outpatient provider after CT scan of the abdomen is performed and is found to have findings concerning for new metastatic gynecological malignancy and small bowel obstruction.  Workup in the hospital showed concerning new onset peritoneal carcinomatosis likely GYN malignancy. General surgery and GYN oncology following.  Had pelvic MRI and CT chest.  Pelvic MRI clearly shows advanced GYN malignancy.  CT chest with contrast showed distal pulmonary embolism without cor pulmonale.  Started on heparin drip.  There is evidence of metastatic disease as previously demonstrated.   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  Oncology and GYN oncology has seen the patient. -Received cycle 1 chemo 2/15 - MRI pelvis, CT chest-reviewed.  Showing pulmonary embolism. - Continue steroids for malignant bowel obstruction.  Down the road can consider octreotide and venting G-tube. -Family understands that overall she can have poor prognosis and we may have to shift our focus towards comfort care depending on her tolerance to the treatment planned above.  Right-sided distal pulmonary embolism without  cor pulmonale Left lower extremity DVT - Currently on heparin drip.  Lower extremity Dopplers positive for DVT - Echocardiogram  Poor nutritional status Severe protein calorie malnutrition - TPN  Essential hypertension -Losartan.  IV as needed  GERD (gastroesophageal reflux disease) PPI  Anemia -Anemia of chronic disease.   DVT prophylaxis: Heparin drip Patient is DNR/DNI Family Communication: Family members up to date Status is: Inpatient Remains inpatient appropriate because: Continue hospital stay for SBO management    Subjective: No new complaints.   Examination:  General exam: Appears calm and comfortable ; NGT in place.  Respiratory system: Clear to auscultation. Respiratory effort normal. Cardiovascular system: S1 & S2 heard, RRR. No JVD, murmurs, rubs, gallops or clicks. No pedal edema. Gastrointestinal system: Abdomen is nondistended, soft and nontender. No organomegaly or masses felt. Normal bowel sounds heard. Central nervous system: Alert and oriented. No focal neurological deficits. Extremities: Symmetric 5 x 5 power. Skin: No rashes, lesions or ulcers Psychiatry: Judgement and insight appear normal. Mood & affect appropriate.                Diet Orders (From admission, onward)     Start     Ordered   02/11/24 1525  Diet NPO time specified Except for: Ice Chips  Diet effective now       Question:  Except for  Answer:  Ice Chips   02/11/24 1524            Objective: Vitals:   02/11/24 2324 02/12/24 0456 02/12/24 1419 02/13/24 0003  BP: 126/76 (!) 150/70 134/67 138/68  Pulse: 64 73 68 69  Resp:  16 18 18   Temp: 97.8 F (36.6 C) 97.7 F (36.5 C) 97.7 F (36.5 C) 97.8 F (36.6 C)  TempSrc: Oral Oral Oral Oral  SpO2: 95% 95% 94% 97%  Weight:      Height:        Intake/Output Summary (Last 24 hours) at 02/13/2024 0950 Last data filed at 02/13/2024 0612 Gross per 24 hour  Intake 1005.72 ml  Output 901 ml  Net 104.72 ml    Filed Weights   02/10/24 0437 02/11/24 0632 02/11/24 0900  Weight: 56.7 kg 53.9 kg 53.9 kg    Scheduled Meds:  Chlorhexidine Gluconate Cloth  6 each Topical Daily   dexamethasone (DECADRON) injection  8 mg Intravenous Daily   insulin aspart  0-9 Units Subcutaneous Q8H   losartan  100 mg Per Tube q morning   pantoprazole (PROTONIX) IV  40 mg Intravenous Q12H   sodium chloride flush  10-40 mL Intracatheter Q12H   Tbo-Filgrastim  300 mcg Subcutaneous q1800   Continuous Infusions:  heparin 850 Units/hr (02/13/24 0612)   TPN ADULT (ION) 70 mL/hr at 02/13/24 0206   TPN ADULT (ION)      Nutritional status Signs/Symptoms: severe fat depletion, moderate fat depletion Interventions: TPN Body mass index is 20.41 kg/m.  Data Reviewed:   CBC: Recent Labs  Lab 02/07/24 0449 02/08/24 0519 02/09/24 0419 02/10/24 0345 02/11/24 0337 02/12/24 0230 02/13/24 0557  WBC 7.2   < > 8.0 8.3 9.6 11.2* 10.3  NEUTROABS 5.2  --   --   --   --   --   --   HGB 10.8*   < > 10.5* 10.7* 10.7* 10.1* 9.9*  HCT 34.3*   < > 33.5* 32.6* 33.5* 31.7* 31.8*  MCV 96.9   < > 98.5 96.2 98.0 96.6 98.5  PLT 288   < > 305 297 304 318 329   < > = values in this interval not displayed.   Basic Metabolic Panel: Recent Labs  Lab 02/08/24 0519 02/09/24 0419 02/10/24 0345 02/11/24 0337 02/12/24 0230 02/13/24 0557  NA 138 141 140 141 139 139  K 3.5 3.8 3.9 4.2 4.0 4.4  CL 104 107 109 111 110 110  CO2 24 26 25 22 22 23   GLUCOSE 102* 118* 124* 130* 105* 115*  BUN 21 21 24* 28* 27* 31*  CREATININE 0.95 0.92 0.87 0.84 0.73 0.73  CALCIUM 8.5* 8.3* 8.2* 8.7* 8.2* 8.0*  MG 1.8 2.0 1.9 2.0 2.0 2.2  PHOS 2.9 2.3* 3.3  --   --   --    GFR: Estimated Creatinine Clearance: 43 mL/min (by C-G formula based on SCr of 0.73 mg/dL). Liver Function Tests: Recent Labs  Lab 02/07/24 0449 02/09/24 0419 02/10/24 0345  AST 16 28 31   ALT 13 20 24   ALKPHOS 69 75 90  BILITOT 0.5 0.4 0.3  PROT 5.4* 5.6* 5.6*  ALBUMIN  2.5* 2.5* 2.2*   No results for input(s): "LIPASE", "AMYLASE" in the last 168 hours. No results for input(s): "AMMONIA" in the last 168 hours. Coagulation Profile: No results for input(s): "INR", "PROTIME" in the last 168 hours. Cardiac Enzymes: No results for input(s): "CKTOTAL", "CKMB", "CKMBINDEX", "TROPONINI" in the last 168 hours. BNP (last 3 results) No results for input(s): "PROBNP" in the last 8760 hours. HbA1C: No results for input(s): "HGBA1C" in the last 72 hours. CBG: Recent Labs  Lab 02/11/24 2341 02/12/24 0724 02/12/24 1623 02/13/24 0010 02/13/24 0720  GLUCAP 111* 113* 142* 113* 106*   Lipid Profile: No results for input(s): "CHOL", "HDL", "LDLCALC", "TRIG", "CHOLHDL", "LDLDIRECT" in the last 72 hours. Thyroid Function Tests: No  results for input(s): "TSH", "T4TOTAL", "FREET4", "T3FREE", "THYROIDAB" in the last 72 hours. Anemia Panel: No results for input(s): "VITAMINB12", "FOLATE", "FERRITIN", "TIBC", "IRON", "RETICCTPCT" in the last 72 hours. Sepsis Labs: No results for input(s): "PROCALCITON", "LATICACIDVEN" in the last 168 hours.  Recent Results (from the past 240 hours)  Body fluid culture w Gram Stain     Status: None   Collection Time: 02/05/24  2:14 PM   Specimen: PATH Cytology Pleural fluid  Result Value Ref Range Status   Specimen Description   Final    PLEURAL Performed at [facility], [address] [place], [place] [address]    Special Requests   Final    NONE Performed at [facility], [address] [place], [place] [address]    Gram Stain WBC SEEN NO ORGANISMS SEEN CYTOSPIN SMEAR   Final   Culture   Final    NO GROWTH 3 DAYS Performed at [facility], [address], [place], [place] [address]    Report Status 02/09/2024 FINAL  Final         Radiology Studies: VAS [place] LOWER EXTREMITY VENOUS (DVT) Result Date: 02/12/2024  Lower Venous DVT Study Patient Name:  BALJIT LIEBERT   Date of Exam:   02/12/2024 Medical Rec #: [id number]           Accession #:    [id number] Date of Birth: March 20, 1935           Patient Gender: F Patient Age:   33 years Exam Location:  [facility] Procedure:      VAS [place] LOWER EXTREMITY VENOUS (DVT) Referring Phys: Stephania Fragmin --------------------------------------------------------------------------------  Indications: Pulmonary embolism.  Risk Factors: Newly diagnosed peritoneal carcinomatosis. Comparison Study: No previous exams Performing Technologist: Jody Hill RVT, RDMS  Examination Guidelines: A complete evaluation includes B-mode imaging, spectral Doppler, color Doppler, and power Doppler as needed of all accessible portions of each vessel. Bilateral testing is considered an integral part of a complete examination. Limited examinations for reoccurring indications may be performed as noted. The reflux portion of the exam is performed with the patient in reverse Trendelenburg.  +---------+---------------+---------+-----------+----------+--------------+ RIGHT    CompressibilityPhasicitySpontaneityPropertiesThrombus Aging +---------+---------------+---------+-----------+----------+--------------+ CFV      Full           Yes      No                                  +---------+---------------+---------+-----------+----------+--------------+ SFJ      Full                                                        +---------+---------------+---------+-----------+----------+--------------+ FV Prox  Full           Yes      Yes                                 +---------+---------------+---------+-----------+----------+--------------+ FV Mid   Full           Yes      Yes                                 +---------+---------------+---------+-----------+----------+--------------+  FV DistalFull           Yes      Yes                                 +---------+---------------+---------+-----------+----------+--------------+ PFV       Full                                                        +---------+---------------+---------+-----------+----------+--------------+ POP      Full           Yes      Yes                                 +---------+---------------+---------+-----------+----------+--------------+ PTV      Full                                                        +---------+---------------+---------+-----------+----------+--------------+ PERO     Full                                                        +---------+---------------+---------+-----------+----------+--------------+   +---------+---------------+---------+-----------+----------+--------------+ LEFT     CompressibilityPhasicitySpontaneityPropertiesThrombus Aging +---------+---------------+---------+-----------+----------+--------------+ CFV      Full           Yes      Yes                                 +---------+---------------+---------+-----------+----------+--------------+ SFJ      Full                                                        +---------+---------------+---------+-----------+----------+--------------+ FV Prox  Full           Yes      Yes                                 +---------+---------------+---------+-----------+----------+--------------+ FV Mid   Full           Yes      Yes                                 +---------+---------------+---------+-----------+----------+--------------+ FV DistalFull           Yes      Yes                                 +---------+---------------+---------+-----------+----------+--------------+ PFV      Full                                                        +---------+---------------+---------+-----------+----------+--------------+  POP      Full           Yes      Yes                                 +---------+---------------+---------+-----------+----------+--------------+ PTV      None           No       No                    Acute          +---------+---------------+---------+-----------+----------+--------------+ PERO     None           No       No                   Acute          +---------+---------------+---------+-----------+----------+--------------+     Summary: BILATERAL: -No evidence of popliteal cyst, bilaterally. RIGHT: - There is no evidence of deep vein thrombosis in the lower extremity.  LEFT: - Findings consistent with acute deep vein thrombosis involving the left peroneal veins, and left posterior tibial veins.   *See table(s) above for measurements and observations. Electronically signed by Carolynn Sayers on 02/12/2024 at 8:18:12 PM.    Final    CT CHEST W CONTRAST Addendum Date: 02/11/2024 ADDENDUM REPORT: 02/11/2024 15:54 ADDENDUM: Critical Value/emergent results were called by telephone at the time of interpretation on 02/11/2024 at 3:54 pm to provider Eugene Garnet, MD, who verbally acknowledged these results. Electronically Signed   By: Delbert Phenix M.D.   On: 02/11/2024 15:54   Result Date: 02/11/2024 CLINICAL DATA:  Peritoneal carcinomatosis. Chest staging. * Tracking Code: BO * EXAM: CT CHEST WITH CONTRAST TECHNIQUE: Multidetector CT imaging of the chest was performed during intravenous contrast administration. RADIATION DOSE REDUCTION: This exam was performed according to the departmental dose-optimization program which includes automated exposure control, adjustment of the mA and/or kV according to patient size and/or use of iterative reconstruction technique. CONTRAST:  75mL OMNIPAQUE IOHEXOL 300 MG/ML  SOLN COMPARISON:  02/04/2024 CT abdomen/pelvis. 02/08/2024 chest radiograph. FINDINGS: Cardiovascular: Normal heart size. No significant pericardial effusion/thickening. Left anterior descending coronary atherosclerosis. Right PICC terminates in the middle third of the SVC. Atherosclerotic nonaneurysmal thoracic aorta. Incidental acute pulmonary embolism involving the distal right pulmonary  artery and lobar and segmental right upper lobe and right middle lobe pulmonary artery branches. No saddle embolus. Normal caliber main pulmonary artery. RV/LV ratio 1.0. Mediastinum/Nodes: No significant thyroid nodules. Fluid level in the mid to lower thoracic esophagus with enteric tube terminating in the proximal stomach. No right axillary adenopathy. Mild left axillary adenopathy up to 1.1 cm (series 2/image 61). No pathologically enlarged mediastinal or hilar nodes. Lungs/Pleura: No pneumothorax. Small layering right pleural effusion. No left pleural effusion. Calcified 1 cm posterior right lower lobe granuloma. Mild dependent bilateral lower lobe atelectasis. No acute consolidative airspace disease, lung masses or significant pulmonary nodules. Upper abdomen: Moderate upper abdominal ascites with nodular soft tissue caking in the visualized anterior left upper peritoneum, as seen on recent CT abdomen study. Several hypodense liver lesions scattered throughout the visualized liver, unchanged, largest 1.6 cm in the inferior liver on series 2/image 155. Musculoskeletal: No aggressive appearing focal osseous lesions. Mild thoracic spondylosis. IMPRESSION: 1. Incidental acute pulmonary embolism involving the distal right pulmonary artery and lobar and segmental right upper lobe and right middle lobe  pulmonary artery branches. No saddle embolus. Normal caliber main pulmonary artery. Borderline elevated RV/LV Ratio = 1.0. 2. Small layering right pleural effusion. Mild dependent bilateral lower lobe atelectasis. 3. Mild left axillary adenopathy, cannot exclude metastatic disease. No other evidence of metastatic disease in the chest. 4. One vessel coronary atherosclerosis. 5. Moderate upper abdominal ascites with nodular soft tissue caking in the visualized anterior left upper peritoneum, as seen on recent CT abdomen study, compatible with peritoneal carcinomatosis. Several hypodense liver lesions scattered throughout  the visualized liver, unchanged, suspicious for liver metastases. Electronically Signed: By: Delbert Phenix M.D. On: 02/11/2024 15:28   MR PELVIS W WO CONTRAST Result Date: 02/11/2024 CLINICAL DATA:  "Occult malignancy". Peritoneal carcinomatosis on abdominopelvic CT with bowel obstruction. EXAM: MRI PELVIS WITHOUT AND WITH CONTRAST TECHNIQUE: Multiplanar multisequence MR imaging of the pelvis was performed both before and after administration of intravenous contrast. CONTRAST:  5mL GADAVIST GADOBUTROL 1 MMOL/ML IV SOLN COMPARISON:  CT of 02/04/2024 FINDINGS: Urinary Tract: Primarily decompressed urinary bladder. Left renal sinus cysts without hydronephrosis. Bowel: Fluid-filled small bowel loops within the upper and mid pelvis measure up to 2.0 cm on 19/17. Improved from 3.4 cm on the prior exam. Again identified is an intussusception of the sigmoid into the rectum. There is mild hyperenhancement within the lead point including on 41/17, without restricted diffusion in this area. Vascular/Lymphatic: Ileocolic mesenteric adenopathy including at 2.2 x 1.8 cm on 08/17. No pelvic sidewall adenopathy. Reproductive: The endometrium is thickened for age including at 1.0 cm on 18/4. This is followed to the level of the cervix and lower uterine segment, where soft tissue fullness is again identified including on 18/4 and 21/3. Correlate restricted diffusion on 22/11. Example 3.7 x 3.7 cm on 31/17. There is amorphous soft tissue fullness within both adnexa, greater right than left. Example restricted diffusion on 17/11. The right adnexal soft tissue fullness measures 3.3 x 3.3 cm on 18/17. No separate ovarian tissue identified. Other: Peritoneal carcinomatosis, as evidenced by diffuse peritoneal enhancement and restricted diffusion (example 22/11). Small volume pelvic fluid. Musculoskeletal: No acute osseous abnormality. IMPRESSION: 1. Abnormal amorphous soft tissue throughout lower uterine segment and cervix could  represent primary uterine/cervical carcinoma or metastatic disease. 2. Diffuse peritoneal carcinomatosis as better imaged on dedicated CT. Ileocolic mesenteric nodal metastasis. 3. Right greater than left adnexal soft tissue fullness likely represents ovarian metastasis. 4. Chronic or recurrent sigmoid/rectal intussusception since 02/04/2024. Vague hyperenhancement in the region of the lead point, without dominant mass. Recommend physical exam correlation to exclude either primary rectosigmoid carcinoma or serosal sigmoid implant lead point. 5. Small volume pelvic fluid. 6. Improved small bowel dilatation since prior CT. Electronically Signed   By: Jeronimo Greaves M.D.   On: 02/11/2024 15:42           LOS: 9 days   Time spent= 35 mins    Miguel Rota, MD [facility]  If 7PM-7AM, please contact night-coverage  02/13/2024, 9:50 AM

## 2024-02-13 NOTE — Progress Notes (Signed)
 PHARMACY - TOTAL PARENTERAL NUTRITION CONSULT NOTE   Indication: Prolonged ileus  Assessment: 89 year old female with SBO likely in setting of malignancy. CT abd/pelvis with peritoneal carcinomatosis, masslike soft tissue in the ureter/cervix. S/p endometrial biopsy by GynOnc, pathology pending for definitive diagnosis. Pharmacy consulted for TPN management.  Glucose / Insulin: no hx DM - on sSSI q8h (used 1 unit in the past 24 hr) - CBGs ( goal < 150): wnl   - Dexamethasone 8 mg daily x2 doses on 2/13 and 2/14 - dexamethasone 10 mg IV x1 with chemo on 2/15  Electrolytes: WNL, including corrCa Renal: ok Hepatic:  - Alb low at 2.2; LFTs/Tbili WNL I/O:  + 105 mL  -UOP: 3x, 0.4 ml/kg - emesis/MG output: 350 mL  GI Imaging: - 2/11 Abd xray: persistent but significantly improved small bowel dilatation when compared with prior - 2/13 abd XRAY: Worsening small bowel obstruction.  - 2/14 chest CT: incidental acute PE - 2/14 pelvis MRI: Abnormal amorphous soft tissue throughout lower uterine segment and cervix could represent primary uterine/cervical carcinoma or metastatic disease.Right greater than left adnexal soft tissue fullness likely represents ovarian metastasis. Ileocolic mesenteric nodal metastasis. Chronic or recurrent sigmoid/rectal intussusception since 02/04/2024. GI Surgeries / Procedures: NA  Central access: PICC TPN start date: 2/11  Nutritional Goals: Goal TPN rate is 70 mL/hr (provides 84 g of protein and 1696 kcals per day)  RD Assessment: Estimated Needs Total Energy Estimated Needs: 1625-1900 kcal Total Protein Estimated Needs: 70-90 g Total Fluid Estimated Needs: 12ml/kcal  Current Nutrition: NPO  Plan:  -Continue TPN at 70 mL/hr -Electrolytes in TPN:  Na 50 mEq/L K 40 mEq/L Ca 3 mEq/L Mg 5 mEq/L Phos 15 mmol/L Cl:Ac ratio 1:1 -Add standard MVI and trace elements to TPN -Continue Sensitive q8h SSI and adjust as needed  -Monitor TPN labs on Mon/Thurs  at minimum, more frequently as needed   Lucia Gaskins, PharmD, BCPS Clinical Pharmacist 02/13/2024 8:13 AM

## 2024-02-13 NOTE — Progress Notes (Signed)
 PHARMACY - ANTICOAGULATION CONSULT NOTE  Pharmacy Consult for heparin  Indication: acute pulmonary embolus and DVT  Allergies  Allergen Reactions   Calcium-Containing Compounds Other (See Comments)    Upsets the stomach, so it is not taken very often    Patient Measurements: Height: 5\' 4"  (162.6 cm) Weight: 53.9 kg (118 lb 14.4 oz) IBW/kg (Calculated) : 54.7 Heparin Dosing Weight: 54 kg  Vital Signs: Temp: 97.7 F (36.5 C) (02/16 1348) Temp Source: Oral (02/16 1348) BP: 142/72 (02/16 1348) Pulse Rate: 71 (02/16 1348)  Labs: Recent Labs    02/11/24 0337 02/11/24 0337 02/12/24 0230 02/12/24 1039 02/13/24 0557 02/13/24 1352  HGB 10.7*  --  10.1*  --  9.9*  --   HCT 33.5*  --  31.7*  --  31.8*  --   PLT 304  --  318  --  329  --   HEPARINUNFRC  --    < > 0.65 0.58 0.68 0.78*  CREATININE 0.84  --  0.73  --  0.73  --    < > = values in this interval not displayed.    Estimated Creatinine Clearance: 43 mL/min (by C-G formula based on SCr of 0.73 mg/dL).   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 Chest CT on 2/14 showed incidental acute PE in the "the distal right pulmonary artery and lobar and segmental right upper lobe and right middle lobe pulmonary artery branches" with borderline elevated RV/LV ratio.  LE doppler on 02/12/24 resulted back with positive acute DVT in the left peroneal veins, and left posterior tibial veins. She's currently on heparin drip for VTE treatment.  Significant events:  - 2/15: received carbo/paclitaxel   Today, 02/13/2024: -Heparin level now supratherapeutic 0.78 with heparin infusion at 850 units/hr -CBC stable -Some bloody secretions from NG tube  overnight, which have cleared; no other complications noted  Goal of Therapy:  Heparin level 0.3-0.7 units/ml Monitor platelets by anticoagulation protocol: Yes   Plan:  -Decrease heparin infusion to 750 units/hr -Recheck heparin level 8 hours after decrease in rate -Daily CBC -Monitor for s/sx bleeding    Pricilla Riffle, PharmD, BCPS Clinical Pharmacist 02/13/2024 3:11 PM

## 2024-02-13 NOTE — Progress Notes (Signed)
 Daily Progress Note   Patient Name: Tracy Preston       Date: 02/13/2024 DOB: 08/30/1935  Age: 89 y.o. MRN#: [id number] Attending Physician: Miguel Rota, MD Primary Care Physician: Marden Noble, MD (Inactive) Admit Date: 02/04/2024 Length of Stay: 9 days  Reason for Consultation/Follow-up: Establishing goals of care  Subjective:   Reviewed EMR prior to presenting to bedside.  Patient has received initial chemotherapy.  NG tube remains in place.  I&O documented from yesterday showing 350 mL output from NG tube.  Presented to bedside to see patient.  Patient's daughter and son-in-law present at bedside.  Able to introduce myself as a member of the palliative medicine team.   Followed up regarding symptom management at this time.  Patient notes that she is not having worrisome symptoms at this time.  The only symptom she noted she may have is fatigue.  Patient feels the steroids are assisting with this.   Patient shared she has been discussing with her family if she would potentially need rehab after hospitalization or not.  Patient knows her family is supportive and going home if desired.  They are considering their options.  Discussed patient would need to be able to tolerate oral food prior to discharge to either location.  Continuing to monitor NG tube output.  Defer to Gyn onc/onc regarding appropriate time to remove. Spent time providing emotional support via active listening.  All questions answered at that time.  Noted palliative medicine team continue to follow with patient's medical journey.  Objective:   Vital Signs:  BP 138/68 (BP Location: Left Arm)   Pulse 69   Temp 97.8 F (36.6 C) (Oral)   Resp 18   Ht 5\' 4"  (1.626 m)   Wt 53.9 kg   SpO2 97%   BMI 20.41 kg/m   Physical Exam:  General: NAD, alert Cardiovascular: RRR, no edema in LE b/l Respiratory: no increased work of breathing noted, not in respiratory distress Skin: no rashes or lesions on visible  skin Neuro: A&Ox4, following commands easily Psych: appropriately answers all questions  Imaging: I personally reviewed recent imaging.   Assessment & Plan:   Assessment: Patient is a 89 year old female with past medical history of hypertension, CKD, and family history of breast cancer who was admitted on 02/04/2024 for management of abdominal distention and vomiting for approximately 2 weeks.  Imaging concerning for new metastatic gynecological malignancy with small bowel obstruction.  Imaging also showed likely metastatic disease to liver and peritoneal carcinomatosis.  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  Gyn Onc and oncology consulted for recommendations.  Palliative medicine team consulted to assist with complex medical decision making.   Recommendations/Plan: # Complex medical decision making/goals of care: -Discussed care with patient as detailed above in HPI. Patient has received initial round of chemotherapy today.  Based on outcomes and symptom burden from this, patient can then decide if she would want to proceed with further chemotherapy or discontinue aggressive medical interventions.    -Patient has continued to state that quality of life is incredibly important to her.  Patient's priorities are that her symptoms to be managed and that she not be in pain.                 -  Code Status: Limited: Do not attempt resuscitation (DNR) -DNR-LIMITED -Do Not Intubate/DNI    # Symptom management Patient is receiving these palliative interventions for symptom management with an intent to improve quality of life.                 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  Receiving IV dexamethasone for symptom management.  Should it be needed, would consider addition of octreotide.     # Psycho-social/Spiritual Support:  - Support System:  daughters, son-in-law, 8 grandchildren    # Discharge Planning:  To Be Determined  Discussed with: Patient, patient's family bedside, RN  Thank you for allowing the palliative care team to participate in the care [name].  [name], DO Palliative Care Provider PMT # [phone]  If patient remains symptomatic despite maximum doses, please call PMT at [phone] between 0700 and 1900. Outside of these hours, please call attending, as PMT does not have night coverage.  Personally spent 35 minutes in patient care including extensive chart review (labs, imaging, progress/consult notes, vital signs), medically appropraite exam, discussed with treatment team, education to patient, family, and staff, documenting clinical information, medication review and management, coordination of care, and available advanced directive documents.

## 2024-02-13 NOTE — Progress Notes (Signed)
 Echocardiogram 2D Echocardiogram has been performed.  Tracy Preston 02/13/2024, 5:57 PM

## 2024-02-13 NOTE — Progress Notes (Signed)
 PHARMACY - ANTICOAGULATION CONSULT NOTE  Pharmacy Consult for heparin  Indication: acute pulmonary embolus and DVT  Allergies  Allergen Reactions   Calcium-Containing Compounds Other (See Comments)    Upsets the stomach, so it is not taken very often    Patient Measurements: Height: 5\' 4"  (162.6 cm) Weight: 53.9 kg (118 lb 14.4 oz) IBW/kg (Calculated) : 54.7 Heparin Dosing Weight: 54 kg  Vital Signs: Temp: 97.8 F (36.6 C) (02/16 0003) Temp Source: Oral (02/16 0003) BP: 138/68 (02/16 0003) Pulse Rate: 69 (02/16 0003)  Labs: Recent Labs    02/11/24 0337 02/12/24 0230 02/12/24 1039 02/13/24 0557  HGB 10.7* 10.1*  --  9.9*  HCT 33.5* 31.7*  --  31.8*  PLT 304 318  --  329  HEPARINUNFRC  --  0.65 0.58 0.68  CREATININE 0.84 0.73  --  0.73    Estimated Creatinine Clearance: 43 mL/min (by C-G formula based on SCr of 0.73 mg/dL).   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 Chest CT on 2/14 showed incidental acute PE in the "the distal right pulmonary artery and lobar and segmental right upper lobe and right middle lobe pulmonary artery branches" with borderline elevated RV/LV ratio.  LE doppler on 02/12/24 resulted back with positive acute DVT in the left peroneal veins, and left posterior tibial veins. She's currently on heparin drip for VTE treatment.  Significant events:  - 2/15: received carbo/paclitaxel   Today, 02/13/2024: - heparin level is therapeutic but it's at the upper end of the therapeutic range with 0.68 - cbc stable - RN reported at ~2a of bright red/bloody secretion in NG tube.  At 7:30a, RN reported no blood in NGT -- she is scheduled to get chemo today   Goal of  Therapy:  Heparin level 0.3-0.7 units/ml Monitor platelets by anticoagulation protocol: Yes   Plan:  - continue heparin drip at 850 units/hr - Since heparin level is at the upper end of therapeutic range, will recheck another level at 1p to ensure level remains therapeutic before changing to daily monitoring  - monitor for s/sx bleeding   Khylen Riolo P 02/13/2024,7:32 AM

## 2024-02-14 ENCOUNTER — Encounter: Payer: Self-pay | Admitting: *Deleted

## 2024-02-14 ENCOUNTER — Encounter: Payer: Self-pay | Admitting: Oncology

## 2024-02-14 DIAGNOSIS — C787 Secondary malignant neoplasm of liver and intrahepatic bile duct: Secondary | ICD-10-CM | POA: Diagnosis not present

## 2024-02-14 DIAGNOSIS — C786 Secondary malignant neoplasm of retroperitoneum and peritoneum: Secondary | ICD-10-CM

## 2024-02-14 DIAGNOSIS — R4589 Other symptoms and signs involving emotional state: Secondary | ICD-10-CM | POA: Diagnosis not present

## 2024-02-14 DIAGNOSIS — Z515 Encounter for palliative care: Secondary | ICD-10-CM | POA: Diagnosis not present

## 2024-02-14 DIAGNOSIS — K56609 Unspecified intestinal obstruction, unspecified as to partial versus complete obstruction: Secondary | ICD-10-CM | POA: Diagnosis not present

## 2024-02-14 DIAGNOSIS — Z7189 Other specified counseling: Secondary | ICD-10-CM | POA: Diagnosis not present

## 2024-02-14 DIAGNOSIS — E440 Moderate protein-calorie malnutrition: Secondary | ICD-10-CM | POA: Diagnosis not present

## 2024-02-14 DIAGNOSIS — C801 Malignant (primary) neoplasm, unspecified: Secondary | ICD-10-CM | POA: Diagnosis not present

## 2024-02-14 LAB — CBC
HCT: 30.6 % — ABNORMAL LOW (ref 36.0–46.0)
Hemoglobin: 9.9 g/dL — ABNORMAL LOW (ref 12.0–15.0)
MCH: 31.5 pg (ref 26.0–34.0)
MCHC: 32.4 g/dL (ref 30.0–36.0)
MCV: 97.5 fL (ref 80.0–100.0)
Platelets: 320 10*3/uL (ref 150–400)
RBC: 3.14 MIL/uL — ABNORMAL LOW (ref 3.87–5.11)
RDW: 12.7 % (ref 11.5–15.5)
WBC: 35.7 10*3/uL — ABNORMAL HIGH (ref 4.0–10.5)
nRBC: 0 % (ref 0.0–0.2)

## 2024-02-14 LAB — COMPREHENSIVE METABOLIC PANEL
ALT: 64 U/L — ABNORMAL HIGH (ref 0–44)
AST: 68 U/L — ABNORMAL HIGH (ref 15–41)
Albumin: 2.4 g/dL — ABNORMAL LOW (ref 3.5–5.0)
Alkaline Phosphatase: 143 U/L — ABNORMAL HIGH (ref 38–126)
Anion gap: 8 (ref 5–15)
BUN: 33 mg/dL — ABNORMAL HIGH (ref 8–23)
CO2: 20 mmol/L — ABNORMAL LOW (ref 22–32)
Calcium: 7.9 mg/dL — ABNORMAL LOW (ref 8.9–10.3)
Chloride: 107 mmol/L (ref 98–111)
Creatinine, Ser: 0.75 mg/dL (ref 0.44–1.00)
GFR, Estimated: >60 mL/min (ref >60)
Glucose, Bld: 119 mg/dL — ABNORMAL HIGH (ref 70–99)
Potassium: 4.4 mmol/L (ref 3.5–5.1)
Sodium: 135 mmol/L (ref 135–145)
Total Bilirubin: 0.4 mg/dL (ref 0.0–1.2)
Total Protein: 5.5 g/dL — ABNORMAL LOW (ref 6.5–8.1)

## 2024-02-14 LAB — HEPARIN LEVEL (UNFRACTIONATED): Heparin Unfractionated: 0.45 [IU]/mL (ref 0.30–0.70)

## 2024-02-14 LAB — TRIGLYCERIDES: Triglycerides: 134 mg/dL (ref <150)

## 2024-02-14 LAB — GLUCOSE, CAPILLARY
Glucose-Capillary: 96 mg/dL (ref 70–99)
Glucose-Capillary: 163 mg/dL — ABNORMAL HIGH (ref 70–99)

## 2024-02-14 LAB — MAGNESIUM: Magnesium: 2.4 mg/dL (ref 1.7–2.4)

## 2024-02-14 LAB — PHOSPHORUS: Phosphorus: 2.8 mg/dL (ref 2.5–4.6)

## 2024-02-14 MED ORDER — CARMEX CLASSIC LIP BALM EX OINT
TOPICAL_OINTMENT | CUTANEOUS | Status: DC | PRN
  Filled 2024-02-14: qty 10

## 2024-02-14 MED ORDER — TRAVASOL 10 % IV SOLN
INTRAVENOUS | Status: AC
  Filled 2024-02-14: qty 840

## 2024-02-14 NOTE — Progress Notes (Signed)
 Daily Progress Note   Patient Name: Tracy HIGGINBOTHAM       Date: 02/14/2024 DOB: January 12, 1935  Age: 89 y.o. MRN#: [id number] Attending Physician: Miguel Rota, MD Primary Care Physician: Marden Noble, MD (Inactive) Admit Date: 02/04/2024 Length of Stay: 10 days  Reason for Consultation/Follow-up: Establishing goals of care  Subjective:   Reviewed EMR prior to presenting to bedside.  NG tube remains in place with 400 mL output noted yesterday.  When presenting to bedside, patient laying comfortably in bed.  Patient denying any symptoms from chemotherapy at this time.  Patient does still feel that the IV dexamethasone helps with what she believes is fatigue.  Patient notes there have not been any updates about time her NG tube would be removed though she is going to try and walk today to assist with bowel movements.  Spent time discussing supporting care moving forward and answering questions as able.  Noted that if patient is planning to follow-up with Dr. Truett Perna at cancer center, can also be seen by outpatient palliative medicine provider there.  Noted will place referral.  Spent time providing emotional support reactive listening.  Noted palliative medicine team and continue to follow with patient's medical journey.  Thank patient for allowing palliative medicine provider to visit with her today.  Objective:   Vital Signs:  BP 124/68 (BP Location: Left Arm)   Pulse 72   Temp 98.2 F (36.8 C) (Oral)   Resp 18   Ht 5\' 4"  (1.626 m)   Wt 53.9 kg   SpO2 92%   BMI 20.41 kg/m   Physical Exam:  General: NAD, alert Cardiovascular: RRR, no edema in LE b/l Respiratory: no increased work of breathing noted, not in respiratory distress Skin: no rashes or lesions on visible skin Neuro: A&Ox4, following commands easily Psych: appropriately answers all questions  Imaging: I personally reviewed recent imaging.   Assessment & Plan:   Assessment: Patient is a 89 year old female with  past medical history of hypertension, CKD, and family history of breast cancer who was admitted on 02/04/2024 for management of abdominal distention and vomiting for approximately 2 weeks.  Imaging concerning for new metastatic gynecological malignancy with small bowel obstruction.  Imaging also showed likely metastatic disease to liver and peritoneal carcinomatosis.  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  Gyn Onc and oncology consulted for recommendations.  Palliative medicine team consulted to assist with complex medical decision making.   Recommendations/Plan: # Complex medical decision making/goals of care: -Discussed care with patient as detailed above in HPI. Patient has received initial round of chemotherapy today.  Based on outcomes and symptom burden from this, patient can then decide if she would want to proceed with further chemotherapy or discontinue aggressive medical interventions.  At this time continuing appropriate medical interventions.  Patient hoping to be able to follow-up with oncology and palliative medicine in the outpatient setting if cannot reach point where NG tube can be removed and she can maintain her oral intake.   -Patient has continued to state that quality of life is incredibly important to her.  Patient's priorities are that her symptoms to be managed and that she not be in pain.                 -  Code Status: Limited: Do not attempt resuscitation (DNR) -DNR-LIMITED -Do Not Intubate/DNI    # Symptom management Patient is receiving these palliative interventions for symptom management with an intent to improve quality of  life.                 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  Receiving IV dexamethasone for symptom management.  Should it be needed, would consider addition of octreotide.      -Patient continuing to  receive IV dexamethasone 8 mg daily.  Based on plan for NG tube removal, will consider decreasing dose in upcoming days.  # Psycho-social/Spiritual Support:  - Support System: daughters, son-in-law, 8 grandchildren    # Discharge Planning:  To Be Determined  -Have placed outpatient PMT referral to follow-up at [facility] with hope patient will be able to complete this.  Discussed with: Patient, patient's friend at bedside, RN  Thank you for allowing the palliative care team to participate in the care [name].  [name], DO Palliative Care Provider PMT # [phone]  If patient remains symptomatic despite maximum doses, please call PMT at [phone] between 0700 and 1900. Outside of these hours, please call attending, as PMT does not have night coverage.  Personally spent 35 minutes in patient care including extensive chart review (labs, imaging, progress/consult notes, vital signs), medically appropraite exam, discussed with treatment team, education to patient, family, and staff, documenting clinical information, medication review and management, coordination of care, and available advanced directive documents.

## 2024-02-14 NOTE — Progress Notes (Signed)
 PROGRESS NOTE    Tracy Preston  UEA:[id number] DOB: 21-Nov-1935 DOA: 02/04/2024 PCP: Marden Noble, MD (Inactive)    Brief Narrative:  89 year old female with a history of hypertension, chronic kidney disease, family history of breast cancer who presents with abdominal distention and vomiting for approximately 2 weeks presenting to [facility] emergency department by her outpatient provider after CT scan of the abdomen is performed and is found to have findings concerning for new metastatic gynecological malignancy and small bowel obstruction.  Workup in the hospital showed concerning new onset peritoneal carcinomatosis likely GYN malignancy. General surgery and GYN oncology following.  Had pelvic MRI and CT chest.  Pelvic MRI clearly shows advanced GYN malignancy.  CT chest with contrast showed distal pulmonary embolism without cor pulmonale.  Started on heparin drip.  There is evidence of metastatic disease as previously demonstrated.   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  Oncology and GYN oncology has seen the patient. -Received cycle 1 chemo 2/15 - MRI pelvis, CT chest-reviewed.  Showing pulmonary embolism. - Continue steroids for malignant bowel obstruction.  Down the road can consider octreotide and venting G-tube. -Family understands that overall she can have poor prognosis and we may have to shift our focus towards comfort care depending on her tolerance to the treatment planned above.  Right-sided distal pulmonary embolism without  cor pulmonale Left lower extremity DVT - Currently on heparin drip.  Lower extremity Dopplers positive for DVT - Echocardiogram  Poor nutritional status Severe protein calorie malnutrition - TPN  Essential hypertension -Losartan.  IV as needed  GERD (gastroesophageal reflux disease) PPI  Anemia -Anemia of chronic disease.   DVT prophylaxis: Heparin drip Patient is DNR/DNI Family Communication: Daughter is present at bedside Status is: Inpatient Remains inpatient appropriate because: Continue hospital stay for SBO management    Subjective: No new complaints.  Overall feeling well  Examination:  General exam: Appears calm and comfortable ; NGT in place.  Respiratory system: Clear to auscultation. Respiratory effort normal. Cardiovascular system: S1 & S2 heard, RRR. No JVD, murmurs, rubs, gallops or clicks. No pedal edema. Gastrointestinal system: Abdomen is nondistended, soft and nontender. No organomegaly or masses felt. Normal bowel sounds heard. Central nervous system: Alert and oriented. No focal neurological deficits. Extremities: Symmetric 5 x 5 power. Skin: No rashes, lesions or ulcers Psychiatry: Judgement and insight appear normal. Mood & affect appropriate.                Diet Orders (From admission, onward)     Start     Ordered   02/11/24 1525  Diet NPO time specified Except for: Ice Chips  Diet effective now       Question:  Except for  Answer:  Ice Chips   02/11/24 1524            Objective: Vitals:   02/13/24 2155 02/14/24 0606 02/14/24 1000 02/14/24 1319  BP: (!) 128/59 124/68 112/66 129/72  Pulse: 65 72 68 73  Resp: 18 18  16   Temp: 98 F (36.7 C) 98.2 F (36.8 C)  97.6 F (36.4 C)  TempSrc: Oral Oral  Oral  SpO2: 95% 92%  92%  Weight:      Height:        Intake/Output Summary (Last 24 hours) at 02/14/2024 1405 Last data filed at 02/14/2024 1003 Gross per 24 hour  Intake 10 ml  Output 1200 ml  Net -1190 ml   Filed  Weights   02/10/24 0437 02/11/24 0632 02/11/24 0900  Weight: 56.7 kg 53.9 kg 53.9 kg    Scheduled Meds:  Chlorhexidine Gluconate Cloth  6 each Topical Daily   dexamethasone (DECADRON) injection  8 mg Intravenous Daily   insulin aspart  0-9 Units Subcutaneous Q8H   losartan  100 mg Per Tube q morning   pantoprazole (PROTONIX) IV  40 mg Intravenous Q12H   sodium chloride flush  10-40 mL Intracatheter Q12H   Tbo-Filgrastim  300 mcg Subcutaneous q1800   Continuous Infusions:  heparin 750 Units/hr (02/14/24 0510)   TPN ADULT (ION) 70 mL/hr at 02/13/24 1832   TPN ADULT (ION)      Nutritional status Signs/Symptoms: severe fat depletion, moderate fat depletion Interventions: TPN Body mass index is 20.41 kg/m.  Data Reviewed:   CBC: Recent Labs  Lab 02/10/24 0345 02/11/24 0337 02/12/24 0230 02/13/24 0557 02/14/24 0200  WBC 8.3 9.6 11.2* 10.3 35.7*  HGB 10.7* 10.7* 10.1* 9.9* 9.9*  HCT 32.6* 33.5* 31.7* 31.8* 30.6*  MCV 96.2 98.0 96.6 98.5 97.5  PLT 297 304 318 329 320   Basic Metabolic Panel: Recent Labs  Lab 02/08/24 0519 02/09/24 0419 02/10/24 0345 02/11/24 0337 02/12/24 0230 02/13/24 0557 02/14/24 0200  NA 138 141 140 141 139 139 135  K 3.5 3.8 3.9 4.2 4.0 4.4 4.4  CL 104 107 109 111 110 110 107  CO2 24 26 25 22 22 23  20*  GLUCOSE 102* 118* 124* 130* 105* 115* 119*  BUN 21 21 24* 28* 27* 31* 33*  CREATININE 0.95 0.92 0.87 0.84 0.73 0.73 0.75  CALCIUM 8.5* 8.3* 8.2* 8.7* 8.2* 8.0* 7.9*  MG 1.8 2.0 1.9 2.0 2.0 2.2 2.4  PHOS 2.9 2.3* 3.3  --   --   --  2.8   GFR: Estimated Creatinine Clearance: 43 mL/min (by C-G formula based on SCr of 0.75 mg/dL). Liver Function Tests: Recent Labs  Lab 02/09/24 0419 02/10/24 0345 02/14/24 0200  AST 28 31 68*  ALT 20 24 64*  ALKPHOS 75 90 143*  BILITOT 0.4 0.3 0.4  PROT 5.6* 5.6* 5.5*  ALBUMIN 2.5* 2.2* 2.4*   No results for input(s): "LIPASE", "AMYLASE" in the last 168 hours. No results for input(s): "AMMONIA" in  the last 168 hours. Coagulation Profile: No results for input(s): "INR", "PROTIME" in the last 168 hours. Cardiac Enzymes: No results for input(s): "CKTOTAL", "CKMB", "CKMBINDEX", "TROPONINI" in the last 168 hours. BNP (last 3 results) No results for input(s): "PROBNP" in the last 8760 hours. HbA1C: No results for input(s): "HGBA1C" in the last 72 hours. CBG: Recent Labs  Lab 02/13/24 0010 02/13/24 0720 02/13/24 1555 02/13/24 2356 02/14/24 0750  GLUCAP 113* 106* 142* 132* 96   Lipid Profile: Recent Labs    02/14/24 1010  TRIG 134   Thyroid Function Tests: No results for input(s): "TSH", "T4TOTAL", "FREET4", "T3FREE", "THYROIDAB" in the last 72 hours. Anemia Panel: No results for input(s): "VITAMINB12", "FOLATE", "FERRITIN", "TIBC", "IRON", "RETICCTPCT" in the last 72 hours. Sepsis Labs: No results for input(s): "PROCALCITON", "LATICACIDVEN" in the last 168 hours.  Recent Results (from the past 240 hours)  Body fluid culture w Gram Stain     Status: None   Collection Time: 02/05/24  2:14 PM   Specimen: PATH Cytology Pleural fluid  Result Value Ref Range Status   Specimen Description   Final    PLEURAL Performed at [facility], [address], [place], [place] [address]    Special Requests   Final    NONE Performed at [facility], [address] [place], [place] [address]    Gram Stain WBC SEEN NO ORGANISMS SEEN CYTOSPIN SMEAR   Final   Culture   Final    NO GROWTH 3 DAYS Performed at [facility], [address] [place], [place] [address]    Report Status 02/09/2024 FINAL  Final         Radiology Studies: ECHOCARDIOGRAM COMPLETE Result Date: 02/13/2024    ECHOCARDIOGRAM REPORT   Patient Name:   Tracy Preston Date of Exam: 02/13/2024 Medical Rec #:  [id number]          Height:       64.0 in Accession #:    [id number]         Weight:       118.9 lb Date of Birth:  [birth year]          BSA:          1.568 m  Patient Age:    88 years           BP:           142/72 mmHg Patient Gender: F                  HR:           71 bpm. Exam Location:  Inpatient Procedure: 2D Echo, Cardiac Doppler and Color Doppler (Both Spectral and Color            Flow Doppler were utilized during procedure). Indications:    Cardiomyopathy- Unspecified I42.9  History:        Patient has no prior history of Echocardiogram examinations.                 Risk Factors:Hypertension.  Sonographer:    Lucendia Herrlich RCS Referring Phys: [id number] Doreen Salvage Amay Mijangos IMPRESSIONS  1. Left ventricular ejection fraction, by estimation, is 65 to 70%. The left ventricle has normal function. The left ventricle has no regional wall motion abnormalities. Left ventricular diastolic parameters were normal.  2. Right ventricular systolic function is normal. The right ventricular size is normal. There is normal pulmonary artery systolic pressure. The estimated right ventricular systolic pressure is 23.8 mmHg.  3. The mitral valve is grossly normal. No evidence of mitral valve regurgitation. No evidence of mitral stenosis.  4. The aortic valve is calcified. Aortic valve regurgitation is trivial. Aortic valve sclerosis/calcification is present, without any evidence of aortic stenosis.  5. The inferior vena cava is normal in size with greater than 50% respiratory variability, suggesting right atrial pressure of 3 mmHg. FINDINGS  Left Ventricle: Left ventricular ejection fraction, by estimation, is 65 to 70%. The left ventricle has normal function. The left ventricle has no regional wall motion abnormalities. Strain imaging was not performed. The left ventricular internal cavity  size was normal in size. There is no left ventricular hypertrophy. Left ventricular diastolic parameters were normal. Right Ventricle: The right ventricular size is normal. No increase in right ventricular wall thickness. Right ventricular systolic function is normal. There is normal  pulmonary artery  systolic pressure. The tricuspid regurgitant velocity is 2.28 m/s, and  with an assumed right atrial pressure of 3 mmHg, the estimated right ventricular systolic pressure is 23.8 mmHg. Left Atrium: Left atrial size was normal in size. Right Atrium: Right atrial size was normal in size. Pericardium: There is no evidence of pericardial effusion. Mitral Valve: The mitral valve is grossly normal. No evidence of mitral valve regurgitation. No evidence of mitral valve stenosis. Tricuspid Valve: The tricuspid valve is grossly normal. Tricuspid valve regurgitation is trivial. No evidence of tricuspid stenosis. Aortic Valve: The aortic valve is calcified. Aortic valve regurgitation is trivial. Aortic regurgitation PHT measures 299 msec. Aortic valve sclerosis/calcification is present, without any evidence of aortic stenosis. Aortic valve mean gradient measures 9.0 mmHg. Aortic valve peak gradient measures 11.1 mmHg. Aortic valve area, by VTI measures 1.23 cm. Pulmonic Valve: The pulmonic valve was grossly normal. Pulmonic valve regurgitation is trivial. No evidence of pulmonic stenosis. Aorta: The aortic root and ascending aorta are structurally normal, with no evidence of dilitation. Venous: The inferior vena cava is normal in size with greater than 50% respiratory variability, suggesting right atrial pressure of 3 mmHg. IAS/Shunts: The atrial septum is grossly normal. Additional Comments: 3D imaging was not performed. Mild ascites is present.  LEFT VENTRICLE PLAX 2D LVIDd:         4.00 cm   Diastology LVIDs:         2.60 cm   LV e' medial:    9.25 cm/s LV PW:         0.90 cm   LV E/e' medial:  6.4 LV IVS:        1.00 cm   LV e' lateral:   12.20 cm/s LVOT diam:     1.80 cm   LV E/e' lateral: 4.9 LV SV:         48 LV SV Index:   31 LVOT Area:     2.54 cm  RIGHT VENTRICLE             IVC RV S prime:     11.00 cm/s  IVC diam: 1.20 cm TAPSE (M-mode): 1.8 cm LEFT ATRIUM             Index        RIGHT ATRIUM           Index LA  diam:        3.70 cm 2.36 cm/m   RA Area:     12.50 cm LA Vol (A2C):   29.1 ml 18.55 ml/m  RA Volume:   29.10 ml  18.55 ml/m LA Vol (A4C):   40.0 ml 25.50 ml/m LA Biplane Vol: 34.3 ml 21.87 ml/m  AORTIC VALVE                     PULMONIC VALVE AV Area (Vmax):    1.61 cm      PR End Diast Vel: 2.12 msec AV Area (Vmean):   1.25 cm AV Area (VTI):     1.23 cm AV Vmax:           166.67 cm/s AV Vmean:          138.000 cm/s AV VTI:            0.392 m AV Peak Grad:      11.1 mmHg AV Mean Grad:      9.0 mmHg LVOT Vmax:         105.50 cm/s  LVOT Vmean:        67.950 cm/s LVOT VTI:          0.190 m LVOT/AV VTI ratio: 0.48 AI PHT:            299 msec  AORTA Ao Root diam: 3.10 cm Ao Asc diam:  2.80 cm MITRAL VALVE               TRICUSPID VALVE MV Area (PHT): 3.27 cm    TR Peak grad:   20.8 mmHg MV Decel Time: 232 msec    TR Vmax:        228.00 cm/s MV E velocity: 59.60 cm/s MV A velocity: 76.60 cm/s  SHUNTS MV E/A ratio:  0.78        Systemic VTI:  0.19 m                            Systemic Diam: 1.80 cm Lennie Odor MD Electronically signed by Lennie Odor MD Signature Date/Time: 02/13/2024/8:53:15 PM    Final    VAS [place] LOWER EXTREMITY VENOUS (DVT) Result Date: 02/12/2024  Lower Venous DVT Study Patient Name:  Tracy Preston  Date of Exam:   02/12/2024 Medical Rec #: [id number]           Accession #:    [id number] Date of Birth: 12-24-1935           Patient Gender: F Patient Age:   51 years Exam Location:  [facility] Procedure:      VAS [place] LOWER EXTREMITY VENOUS (DVT) Referring Phys: Stephania Fragmin --------------------------------------------------------------------------------  Indications: Pulmonary embolism.  Risk Factors: Newly diagnosed peritoneal carcinomatosis. Comparison Study: No previous exams Performing Technologist: Jody Hill RVT, RDMS  Examination Guidelines: A complete evaluation includes B-mode imaging, spectral Doppler, color Doppler, and power Doppler as needed of all accessible portions of  each vessel. Bilateral testing is considered an integral part of a complete examination. Limited examinations for reoccurring indications may be performed as noted. The reflux portion of the exam is performed with the patient in reverse Trendelenburg.  +---------+---------------+---------+-----------+----------+--------------+ RIGHT    CompressibilityPhasicitySpontaneityPropertiesThrombus Aging +---------+---------------+---------+-----------+----------+--------------+ CFV      Full           Yes      No                                  +---------+---------------+---------+-----------+----------+--------------+ SFJ      Full                                                        +---------+---------------+---------+-----------+----------+--------------+ FV Prox  Full           Yes      Yes                                 +---------+---------------+---------+-----------+----------+--------------+ FV Mid   Full           Yes      Yes                                 +---------+---------------+---------+-----------+----------+--------------+  FV DistalFull           Yes      Yes                                 +---------+---------------+---------+-----------+----------+--------------+ PFV      Full                                                        +---------+---------------+---------+-----------+----------+--------------+ POP      Full           Yes      Yes                                 +---------+---------------+---------+-----------+----------+--------------+ PTV      Full                                                        +---------+---------------+---------+-----------+----------+--------------+ PERO     Full                                                        +---------+---------------+---------+-----------+----------+--------------+   +---------+---------------+---------+-----------+----------+--------------+ LEFT      CompressibilityPhasicitySpontaneityPropertiesThrombus Aging +---------+---------------+---------+-----------+----------+--------------+ CFV      Full           Yes      Yes                                 +---------+---------------+---------+-----------+----------+--------------+ SFJ      Full                                                        +---------+---------------+---------+-----------+----------+--------------+ FV Prox  Full           Yes      Yes                                 +---------+---------------+---------+-----------+----------+--------------+ FV Mid   Full           Yes      Yes                                 +---------+---------------+---------+-----------+----------+--------------+ FV DistalFull           Yes      Yes                                 +---------+---------------+---------+-----------+----------+--------------+ PFV      Full                                                        +---------+---------------+---------+-----------+----------+--------------+  POP      Full           Yes      Yes                                 +---------+---------------+---------+-----------+----------+--------------+ PTV      None           No       No                   Acute          +---------+---------------+---------+-----------+----------+--------------+ PERO     None           No       No                   Acute          +---------+---------------+---------+-----------+----------+--------------+     Summary: BILATERAL: -No evidence of popliteal cyst, bilaterally. RIGHT: - There is no evidence of deep vein thrombosis in the lower extremity.  LEFT: - Findings consistent with acute deep vein thrombosis involving the left peroneal veins, and left posterior tibial veins.   *See table(s) above for measurements and observations. Electronically signed by Carolynn Sayers on 02/12/2024 at 8:18:12 PM.    Final            LOS: 10  days   Time spent= 35 mins    Miguel Rota, MD [facility]  If 7PM-7AM, please contact night-coverage  02/14/2024, 2:05 PM

## 2024-02-14 NOTE — Progress Notes (Addendum)
 Tracy Preston   DOB:05/29/1935   ZO#:[id number]      ASSESSMENT & PLAN:  1.  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 - Newly diagnosed February 2025 - CT scan done 02/04/2024 shows diffuse peritoneal carcinomatosis and mild ascites. - Pelvic ultrasound on 02/05/2024 shows 2 cm heterogeneous area at the uterine fundus, endometrial malignancy not excluded. - S/p paracentesis 02/05/2024, 100 mL of yellow fluid removed. 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 -MRI pelvis and CT chest for staging done on 02/11/2024.  Shows abnormal soft tissue throughout the lower uterine and cervix could represent primary uterine/cervical carcinoma or mets. - Chemotherapy with carboplatin and Taxol initiated on 02/12/2024.  Tolerated well. - On Granix 300 mcg daily x 5 started on 02/13/2024.  WBC somewhat elevated, likely due to Granix.  However will continue Granix for now. - GYN Onc following closely - Medical oncology/Dr. Truett Perna also following closely.   2.  Small bowel obstruction - Distal SBO with transition point in the right lower quadrant  -Likely due to peritoneal carcinomatosis - Continue with NGT to wall suction.   - TPN continues to infuse well.   - Management per surgery  3.  Acute pulmonary embolism - Per CT of the chest for staging purposes done on 02/11/2024.  This showed incidental acute PE involving the distal right pulmonary artery and lobar and segmental RUL and RML pulmonary artery branches.  Small right pleural effusion. - Started on IV heparin drip   4.  Anemia, normocytic - Hemoglobin remains stable at 9.9 today  - Transfuse PRBC for hemoglobin <7.0.  No transfusional intervention warranted at this time - Monitor CBC with differential closely   4.  Hypertension - On losartan at home - Monitor BP counts - Primary team  following     Code Status DNR-Limited   Subjective:  Patient seen awake and alert laying supine in bed.  Family members at bedside.  States she just ambulated in hallway with family members.  Reports that she tolerated chemotherapy very well over the weekend with no side effects.  Hoping that her abdominal distention will decrease.  Denies other acute complaints.  No acute distress is noted.  Objective:  Vitals:   02/14/24 0606 02/14/24 1000  BP: 124/68 112/66  Pulse: 72 68  Resp: 18   Temp: 98.2 F (36.8 C)   SpO2: 92%      Intake/Output Summary (Last 24 hours) at 02/14/2024 1016 Last data filed at 02/14/2024 1003 Gross per 24 hour  Intake 10 ml  Output 1200 ml  Net -1190 ml     REVIEW OF SYSTEMS:   Constitutional: + Fatigue, denies fevers, chills or abnormal night sweats Eyes: Denies blurriness of vision, double vision or watery eyes Ears, nose, mouth, throat, and face: Denies mucositis or sore throat Respiratory: Denies cough, dyspnea or wheezes Cardiovascular: Denies palpitation, chest discomfort or lower extremity swelling Gastrointestinal: + Abdominal distention Skin: Denies abnormal skin rashes Lymphatics: Denies new lymphadenopathy or easy bruising Neurological: Denies numbness, tingling or new weaknesses Behavioral/Psych: Mood is stable, no new changes  All other systems were reviewed with the patient and are negative.  PHYSICAL EXAMINATION: ECOG PERFORMANCE STATUS: 2 - Symptomatic, <50% confined to bed  Vitals:   02/14/24 0606 02/14/24 1000  BP: 124/68 112/66  Pulse: 72 68  Resp: 18   Temp: 98.2 F (36.8 C)   SpO2: 92%  Filed Weights   02/10/24 0437 02/11/24 7564 02/11/24 0900  Weight: 125 lb (56.7 kg) 118 lb 14.4 oz (53.9 kg) 118 lb 14.4 oz (53.9 kg)    GENERAL: alert, no distress and comfortable SKIN: + Pale skin color, texture, turgor are normal, no rashes or significant lesions EYES: normal, conjunctiva are pink and non-injected, sclera  clear OROPHARYNX: no exudate, no erythema and lips, buccal mucosa, and tongue normal  NECK: supple, thyroid normal size, non-tender, without nodularity LYMPH: no palpable lymphadenopathy in the cervical, axillary or inguinal LUNGS: clear to auscultation and percussion with normal breathing effort HEART: regular rate & rhythm and no murmurs and no lower extremity edema ABDOMEN: + Abdominal distention  MUSCULOSKELETAL: no cyanosis of digits and no clubbing  PSYCH: alert & oriented x 3 with fluent speech NEURO: no focal motor/sensory deficits   All questions were answered. The patient knows to call the clinic with any problems, questions or concerns.   The total time spent in the appointment was 40 minutes encounter with patient including review of chart and various tests results, discussions about plan of care and coordination of care plan  Dawson Bills, NP 02/14/2024 10:16 AM    Labs Reviewed:  Lab Results  Component Value Date   WBC 35.7 (H) 02/14/2024   HGB 9.9 (L) 02/14/2024   HCT 30.6 (L) 02/14/2024   MCV 97.5 02/14/2024   PLT 320 02/14/2024   Recent Labs    02/09/24 0419 02/10/24 0345 02/11/24 0337 02/12/24 0230 02/13/24 0557 02/14/24 0200  NA 141 140   < > 139 139 135  K 3.8 3.9   < > 4.0 4.4 4.4  CL 107 109   < > 110 110 107  CO2 26 25   < > 22 23 20*  GLUCOSE 118* 124*   < > 105* 115* 119*  BUN 21 24*   < > 27* 31* 33*  CREATININE 0.92 0.87   < > 0.73 0.73 0.75  CALCIUM 8.3* 8.2*   < > 8.2* 8.0* 7.9*  GFRNONAA >60 >60   < > >60 >60 >60  PROT 5.6* 5.6*  --   --   --  5.5*  ALBUMIN 2.5* 2.2*  --   --   --  2.4*  AST 28 31  --   --   --  68*  ALT 20 24  --   --   --  64*  ALKPHOS 75 90  --   --   --  143*  BILITOT 0.4 0.3  --   --   --  0.4   < > = values in this interval not displayed.    Studies Reviewed:  ECHOCARDIOGRAM COMPLETE Result Date: 02/13/2024    ECHOCARDIOGRAM REPORT   Patient Name:   Tracy Preston Date of Exam: 02/13/2024 Medical Rec #:   [id number]          Height:       64.0 in Accession #:    [id number]         Weight:       118.9 lb Date of Birth:  05-27-1935          BSA:          1.568 m Patient Age:    88 years           BP:           142/72 mmHg Patient Gender: F  HR:           71 bpm. Exam Location:  Inpatient Procedure: 2D Echo, Cardiac Doppler and Color Doppler (Both Spectral and Color            Flow Doppler were utilized during procedure). Indications:    Cardiomyopathy- Unspecified I42.9  History:        Patient has no prior history of Echocardiogram examinations.                 Risk Factors:Hypertension.  Sonographer:    Lucendia Herrlich RCS Referring Phys: [id number] Doreen Salvage AMIN IMPRESSIONS  1. Left ventricular ejection fraction, by estimation, is 65 to 70%. The left ventricle has normal function. The left ventricle has no regional wall motion abnormalities. Left ventricular diastolic parameters were normal.  2. Right ventricular systolic function is normal. The right ventricular size is normal. There is normal pulmonary artery systolic pressure. The estimated right ventricular systolic pressure is 23.8 mmHg.  3. The mitral valve is grossly normal. No evidence of mitral valve regurgitation. No evidence of mitral stenosis.  4. The aortic valve is calcified. Aortic valve regurgitation is trivial. Aortic valve sclerosis/calcification is present, without any evidence of aortic stenosis.  5. The inferior vena cava is normal in size with greater than 50% respiratory variability, suggesting right atrial pressure of 3 mmHg. FINDINGS  Left Ventricle: Left ventricular ejection fraction, by estimation, is 65 to 70%. The left ventricle has normal function. The left ventricle has no regional wall motion abnormalities. Strain imaging was not performed. The left ventricular internal cavity  size was normal in size. There is no left ventricular hypertrophy. Left ventricular diastolic parameters were normal. Right Ventricle: The right  ventricular size is normal. No increase in right ventricular wall thickness. Right ventricular systolic function is normal. There is normal pulmonary artery systolic pressure. The tricuspid regurgitant velocity is 2.28 m/s, and  with an assumed right atrial pressure of 3 mmHg, the estimated right ventricular systolic pressure is 23.8 mmHg. Left Atrium: Left atrial size was normal in size. Right Atrium: Right atrial size was normal in size. Pericardium: There is no evidence of pericardial effusion. Mitral Valve: The mitral valve is grossly normal. No evidence of mitral valve regurgitation. No evidence of mitral valve stenosis. Tricuspid Valve: The tricuspid valve is grossly normal. Tricuspid valve regurgitation is trivial. No evidence of tricuspid stenosis. Aortic Valve: The aortic valve is calcified. Aortic valve regurgitation is trivial. Aortic regurgitation PHT measures 299 msec. Aortic valve sclerosis/calcification is present, without any evidence of aortic stenosis. Aortic valve mean gradient measures 9.0 mmHg. Aortic valve peak gradient measures 11.1 mmHg. Aortic valve area, by VTI measures 1.23 cm. Pulmonic Valve: The pulmonic valve was grossly normal. Pulmonic valve regurgitation is trivial. No evidence of pulmonic stenosis. Aorta: The aortic root and ascending aorta are structurally normal, with no evidence of dilitation. Venous: The inferior vena cava is normal in size with greater than 50% respiratory variability, suggesting right atrial pressure of 3 mmHg. IAS/Shunts: The atrial septum is grossly normal. Additional Comments: 3D imaging was not performed. Mild ascites is present.  LEFT VENTRICLE PLAX 2D LVIDd:         4.00 cm   Diastology LVIDs:         2.60 cm   LV e' medial:    9.25 cm/s LV PW:         0.90 cm   LV E/e' medial:  6.4 LV IVS:  1.00 cm   LV e' lateral:   12.20 cm/s LVOT diam:     1.80 cm   LV E/e' lateral: 4.9 LV SV:         48 LV SV Index:   31 LVOT Area:     2.54 cm  RIGHT  VENTRICLE             IVC RV S prime:     11.00 cm/s  IVC diam: 1.20 cm TAPSE (M-mode): 1.8 cm LEFT ATRIUM             Index        RIGHT ATRIUM           Index LA diam:        3.70 cm 2.36 cm/m   RA Area:     12.50 cm LA Vol (A2C):   29.1 ml 18.55 ml/m  RA Volume:   29.10 ml  18.55 ml/m LA Vol (A4C):   40.0 ml 25.50 ml/m LA Biplane Vol: 34.3 ml 21.87 ml/m  AORTIC VALVE                     PULMONIC VALVE AV Area (Vmax):    1.61 cm      PR End Diast Vel: 2.12 msec AV Area (Vmean):   1.25 cm AV Area (VTI):     1.23 cm AV Vmax:           166.67 cm/s AV Vmean:          138.000 cm/s AV VTI:            0.392 m AV Peak Grad:      11.1 mmHg AV Mean Grad:      9.0 mmHg LVOT Vmax:         105.50 cm/s LVOT Vmean:        67.950 cm/s LVOT VTI:          0.190 m LVOT/AV VTI ratio: 0.48 AI PHT:            299 msec  AORTA Ao Root diam: 3.10 cm Ao Asc diam:  2.80 cm MITRAL VALVE               TRICUSPID VALVE MV Area (PHT): 3.27 cm    TR Peak grad:   20.8 mmHg MV Decel Time: 232 msec    TR Vmax:        228.00 cm/s MV E velocity: 59.60 cm/s MV A velocity: 76.60 cm/s  SHUNTS MV E/A ratio:  0.78        Systemic VTI:  0.19 m                            Systemic Diam: 1.80 cm Lennie Odor MD Electronically signed by Lennie Odor MD Signature Date/Time: 02/13/2024/8:53:15 PM    Final    VAS [place] LOWER EXTREMITY VENOUS (DVT) Result Date: 02/12/2024  Lower Venous DVT Study Patient Name:  ABRIA VANNOSTRAND  Date of Exam:   02/12/2024 Medical Rec #: [id number]           Accession #:    [id number] Date of Birth: [birth year]           Patient Gender: F Patient Age:   38 years Exam Location:  [facility] Procedure:      VAS [place] LOWER EXTREMITY VENOUS (DVT) Referring Phys: Stephania Fragmin --------------------------------------------------------------------------------  Indications: Pulmonary embolism.  Risk Factors:  Newly diagnosed peritoneal carcinomatosis. Comparison Study: No previous exams Performing Technologist: Jody Hill RVT, RDMS   Examination Guidelines: A complete evaluation includes B-mode imaging, spectral Doppler, color Doppler, and power Doppler as needed of all accessible portions of each vessel. Bilateral testing is considered an integral part of a complete examination. Limited examinations for reoccurring indications may be performed as noted. The reflux portion of the exam is performed with the patient in reverse Trendelenburg.  +---------+---------------+---------+-----------+----------+--------------+ RIGHT    CompressibilityPhasicitySpontaneityPropertiesThrombus Aging +---------+---------------+---------+-----------+----------+--------------+ CFV      Full           Yes      No                                  +---------+---------------+---------+-----------+----------+--------------+ SFJ      Full                                                        +---------+---------------+---------+-----------+----------+--------------+ FV Prox  Full           Yes      Yes                                 +---------+---------------+---------+-----------+----------+--------------+ FV Mid   Full           Yes      Yes                                 +---------+---------------+---------+-----------+----------+--------------+ FV DistalFull           Yes      Yes                                 +---------+---------------+---------+-----------+----------+--------------+ PFV      Full                                                        +---------+---------------+---------+-----------+----------+--------------+ POP      Full           Yes      Yes                                 +---------+---------------+---------+-----------+----------+--------------+ PTV      Full                                                        +---------+---------------+---------+-----------+----------+--------------+ PERO     Full                                                         +---------+---------------+---------+-----------+----------+--------------+   +---------+---------------+---------+-----------+----------+--------------+  LEFT     CompressibilityPhasicitySpontaneityPropertiesThrombus Aging +---------+---------------+---------+-----------+----------+--------------+ CFV      Full           Yes      Yes                                 +---------+---------------+---------+-----------+----------+--------------+ SFJ      Full                                                        +---------+---------------+---------+-----------+----------+--------------+ FV Prox  Full           Yes      Yes                                 +---------+---------------+---------+-----------+----------+--------------+ FV Mid   Full           Yes      Yes                                 +---------+---------------+---------+-----------+----------+--------------+ FV DistalFull           Yes      Yes                                 +---------+---------------+---------+-----------+----------+--------------+ PFV      Full                                                        +---------+---------------+---------+-----------+----------+--------------+ POP      Full           Yes      Yes                                 +---------+---------------+---------+-----------+----------+--------------+ PTV      None           No       No                   Acute          +---------+---------------+---------+-----------+----------+--------------+ PERO     None           No       No                   Acute          +---------+---------------+---------+-----------+----------+--------------+     Summary: BILATERAL: -No evidence of popliteal cyst, bilaterally. RIGHT: - There is no evidence of deep vein thrombosis in the lower extremity.  LEFT: - Findings consistent with acute deep vein thrombosis involving the left peroneal veins, and left posterior tibial veins.    *See table(s) above for measurements and observations. Electronically signed by Carolynn Sayers on 02/12/2024 at 8:18:12 PM.    Final    CT CHEST W CONTRAST Addendum Date: 02/11/2024 ADDENDUM REPORT: 02/11/2024 15:54  ADDENDUM: Critical Value/emergent results were called by telephone at the time of interpretation on 02/11/2024 at 3:54 pm to provider Eugene Garnet, MD, who verbally acknowledged these results. Electronically Signed   By: Delbert Phenix M.D.   On: 02/11/2024 15:54   Result Date: 02/11/2024 CLINICAL DATA:  Peritoneal carcinomatosis. Chest staging. * Tracking Code: BO * EXAM: CT CHEST WITH CONTRAST TECHNIQUE: Multidetector CT imaging of the chest was performed during intravenous contrast administration. RADIATION DOSE REDUCTION: This exam was performed according to the departmental dose-optimization program which includes automated exposure control, adjustment of the mA and/or kV according to patient size and/or use of iterative reconstruction technique. CONTRAST:  75mL OMNIPAQUE IOHEXOL 300 MG/ML  SOLN COMPARISON:  02/04/2024 CT abdomen/pelvis. 02/08/2024 chest radiograph. FINDINGS: Cardiovascular: Normal heart size. No significant pericardial effusion/thickening. Left anterior descending coronary atherosclerosis. Right PICC terminates in the middle third of the SVC. Atherosclerotic nonaneurysmal thoracic aorta. Incidental acute pulmonary embolism involving the distal right pulmonary artery and lobar and segmental right upper lobe and right middle lobe pulmonary artery branches. No saddle embolus. Normal caliber main pulmonary artery. RV/LV ratio 1.0. Mediastinum/Nodes: No significant thyroid nodules. Fluid level in the mid to lower thoracic esophagus with enteric tube terminating in the proximal stomach. No right axillary adenopathy. Mild left axillary adenopathy up to 1.1 cm (series 2/image 61). No pathologically enlarged mediastinal or hilar nodes. Lungs/Pleura: No pneumothorax. Small layering  right pleural effusion. No left pleural effusion. Calcified 1 cm posterior right lower lobe granuloma. Mild dependent bilateral lower lobe atelectasis. No acute consolidative airspace disease, lung masses or significant pulmonary nodules. Upper abdomen: Moderate upper abdominal ascites with nodular soft tissue caking in the visualized anterior left upper peritoneum, as seen on recent CT abdomen study. Several hypodense liver lesions scattered throughout the visualized liver, unchanged, largest 1.6 cm in the inferior liver on series 2/image 155. Musculoskeletal: No aggressive appearing focal osseous lesions. Mild thoracic spondylosis. IMPRESSION: 1. Incidental acute pulmonary embolism involving the distal right pulmonary artery and lobar and segmental right upper lobe and right middle lobe pulmonary artery branches. No saddle embolus. Normal caliber main pulmonary artery. Borderline elevated RV/LV Ratio = 1.0. 2. Small layering right pleural effusion. Mild dependent bilateral lower lobe atelectasis. 3. Mild left axillary adenopathy, cannot exclude metastatic disease. No other evidence of metastatic disease in the chest. 4. One vessel coronary atherosclerosis. 5. Moderate upper abdominal ascites with nodular soft tissue caking in the visualized anterior left upper peritoneum, as seen on recent CT abdomen study, compatible with peritoneal carcinomatosis. Several hypodense liver lesions scattered throughout the visualized liver, unchanged, suspicious for liver metastases. Electronically Signed: By: Delbert Phenix M.D. On: 02/11/2024 15:28   MR PELVIS W WO CONTRAST Result Date: 02/11/2024 CLINICAL DATA:  "Occult malignancy". Peritoneal carcinomatosis on abdominopelvic CT with bowel obstruction. EXAM: MRI PELVIS WITHOUT AND WITH CONTRAST TECHNIQUE: Multiplanar multisequence MR imaging of the pelvis was performed both before and after administration of intravenous contrast. CONTRAST:  5mL GADAVIST GADOBUTROL 1 MMOL/ML IV  SOLN COMPARISON:  CT of 02/04/2024 FINDINGS: Urinary Tract: Primarily decompressed urinary bladder. Left renal sinus cysts without hydronephrosis. Bowel: Fluid-filled small bowel loops within the upper and mid pelvis measure up to 2.0 cm on 19/17. Improved from 3.4 cm on the prior exam. Again identified is an intussusception of the sigmoid into the rectum. There is mild hyperenhancement within the lead point including on 41/17, without restricted diffusion in this area. Vascular/Lymphatic: Ileocolic mesenteric adenopathy including at 2.2 x 1.8 cm on 08/17. No pelvic  sidewall adenopathy. Reproductive: The endometrium is thickened for age including at 1.0 cm on 18/4. This is followed to the level of the cervix and lower uterine segment, where soft tissue fullness is again identified including on 18/4 and 21/3. Correlate restricted diffusion on 22/11. Example 3.7 x 3.7 cm on 31/17. There is amorphous soft tissue fullness within both adnexa, greater right than left. Example restricted diffusion on 17/11. The right adnexal soft tissue fullness measures 3.3 x 3.3 cm on 18/17. No separate ovarian tissue identified. Other: Peritoneal carcinomatosis, as evidenced by diffuse peritoneal enhancement and restricted diffusion (example 22/11). Small volume pelvic fluid. Musculoskeletal: No acute osseous abnormality. IMPRESSION: 1. Abnormal amorphous soft tissue throughout lower uterine segment and cervix could represent primary uterine/cervical carcinoma or metastatic disease. 2. Diffuse peritoneal carcinomatosis as better imaged on dedicated CT. Ileocolic mesenteric nodal metastasis. 3. Right greater than left adnexal soft tissue fullness likely represents ovarian metastasis. 4. Chronic or recurrent sigmoid/rectal intussusception since 02/04/2024. Vague hyperenhancement in the region of the lead point, without dominant mass. Recommend physical exam correlation to exclude either primary rectosigmoid carcinoma or serosal sigmoid  implant lead point. 5. Small volume pelvic fluid. 6. Improved small bowel dilatation since prior CT. Electronically Signed   By: Jeronimo Greaves M.D.   On: 02/11/2024 15:42   DG Abd 1 View Result Date: 02/10/2024 CLINICAL DATA:  [id number] Abdominal distention [id number] [phone] SBO (small bowel obstruction) (HCC) [id number] EXAM: ABDOMEN - 1 VIEW COMPARISON:  02/08/2024 FINDINGS: NG tube terminates within the proximal stomach. Worsening small bowel dilation in the mid abdomen measuring up to 5.3 cm (previously 3.3 cm. Paucity of bowel gas within the colon. No gross free intraperitoneal air. IMPRESSION: 1. Worsening small bowel obstruction. 2. NG tube terminates within the proximal stomach. Recommend advancement. Electronically Signed   By: Duanne Guess D.O.   On: 02/10/2024 09:35   DG Chest 1 View Result Date: 02/08/2024 CLINICAL DATA:  PICC line placement EXAM: CHEST  1 VIEW COMPARISON:  02/05/2024 FINDINGS: Right PICC line in place with the tip in the SVC. NG tube is in the stomach. Heart and mediastinal contours within normal limits. No confluent opacities or effusions. No acute bony abnormality. IMPRESSION: Right PICC line tip in the SVC. No active cardiopulmonary disease. Electronically Signed   By: Charlett Nose M.D.   On: 02/08/2024 17:51   [place] EKG SITE RITE Result Date: 02/08/2024 If Site Rite image not attached, placement could not be confirmed due to current cardiac rhythm.  DG Abd Portable 1V Result Date: 02/08/2024 CLINICAL DATA:  Check gastric catheter placement EXAM: PORTABLE ABDOMEN - 1 VIEW COMPARISON:  02/04/2024 FINDINGS: Scattered large and small bowel gas is noted. Mild small bowel dilatation is noted significantly improved when compared with the prior exam. Gastric catheter is noted with the tip in the stomach. No free air is noted. IMPRESSION: Persistent but significantly improved small bowel dilatation when compared with the prior exam. Electronically Signed   By: Alcide Clever M.D.   On:  02/08/2024 09:34   US Paracentesis Result Date: 02/05/2024 INDICATION: 89 year old with new abdominal distention, ascites. Request made for diagnostic and therapeutic paracentesis. EXAM: ULTRASOUND GUIDED DIAGNOSTIC  PARACENTESIS MEDICATIONS: 10 mL 1% lidocaine. COMPLICATIONS: None immediate. PROCEDURE: Informed written consent was obtained from the patient after a discussion of the risks, benefits and alternatives to treatment. A timeout was performed prior to the initiation of the procedure. Initial ultrasound scanning demonstrates a small amount of ascites within the left lateral abdomen  interlooped with bowel. The left lateral abdomen was prepped and draped in the usual sterile fashion. 1% lidocaine was used for local anesthesia. Following this, a 19 gauge, 7-cm, Yueh catheter was introduced. An ultrasound image was saved for documentation purposes. The paracentesis was performed. The catheter was removed and a dressing was applied. The patient tolerated the procedure well without immediate post procedural complication. FINDINGS: A total of approximately 100 mL of yellow fluid was removed. Samples were sent to the laboratory as requested by the clinical team. IMPRESSION: Successful ultrasound-guided paracentesis yielding 100 mL of peritoneal fluid. Performed by: Loyce Dys PA-C Electronically Signed   By: Malachy Moan M.D.   On: 02/05/2024 14:31   US PELVIC COMPLETE WITH TRANSVAGINAL Result Date: 02/05/2024 CLINICAL DATA:  89 year old female with ascites, peritoneal carcinomatosis on CT Abdomen and Pelvis yesterday. EXAM: TRANSABDOMINAL AND TRANSVAGINAL ULTRASOUND OF PELVIS TECHNIQUE: Both transabdominal and transvaginal ultrasound examinations of the pelvis were performed. Transabdominal technique was performed for global imaging of the pelvis including uterus, ovaries, adnexal regions, and pelvic cul-de-sac. It was necessary to proceed with endovaginal exam following the transabdominal exam to  visualize the ovaries. COMPARISON:  CT Abdomen and Pelvis 02/04/2024. FINDINGS: Uterus Measurements: 6.9 x 3.9 x 3.9 cm = volume: 41 mL. No obvious myometrial mass. Endometrium Heterogeneous hypoechoic area at the fundus endometrium (image 49 of series 1). This area encompasses about 2 cm (image 55). See also cine series 3 images. Otherwise the echogenic endometrial stripe is up to 5 mm. Right ovary Measurements: 3.5 x 2.1 x 2.8 cm = volume: 11 mL. No ovarian mass identified. Left ovary Measurements: 2.3 x 1.5 x 1.7 cm = volume: 3 mL. No ovarian mass identified. Other findings Small volume ascites again visible in the pelvis. IMPRESSION: 1. No ovarian mass identified by ultrasound. 2. There is a 2 cm heterogeneous area at the uterine fundus which is favored to be endometrial related. Elsewhere the endometrial stripe is about 5 mm. An Endometrial Malignancy is not excluded. 3. Ascites, in conjunction with the extensive additional abdominal and pelvic abnormalities demonstrated by CT yesterday. Electronically Signed   By: Odessa Fleming M.D.   On: 02/05/2024 12:17   DG Chest Portable 1 View Result Date: 02/05/2024 CLINICAL DATA:  NG tube replacement confirmation EXAM: PORTABLE CHEST - 1 VIEW COMPARISON:  02/04/2024 FINDINGS: Cardiomediastinal silhouette and pulmonary vasculature are within normal limits. Lungs are clear. Nasogastric tube extends to the left upper quadrant the side hole located at the level of the gastroesophageal junction. It is slightly retracted compared to prior exam. IMPRESSION: Nasogastric tube terminates in the left upper quadrant in the expected site of the stomach. The side hole of the NG tube is located at the level of the gastroesophageal junction. Electronically Signed   By: Acquanetta Belling M.D.   On: 02/05/2024 07:38   DG Chest Portable 1 View Result Date: 02/04/2024 CLINICAL DATA:  NG tube placement. Abdominal discomfort, heartburn, and nausea. EXAM: PORTABLE CHEST 1 VIEW COMPARISON:  None  Available. FINDINGS: The heart size and mediastinal contours are within normal limits. There is atherosclerotic calcification of the aorta. No consolidation, effusion, or pneumothorax. An enteric tube terminates in the stomach. No acute osseous abnormality. IMPRESSION: 1. No active disease. 2. Enteric tube terminates in the stomach. Electronically Signed   By: Thornell Sartorius M.D.   On: 02/04/2024 21:20   CT ABDOMEN PELVIS W CONTRAST Result Date: 02/04/2024 CLINICAL DATA:  Abdominal pain, bloating, nausea and vomiting for 2 weeks. * Tracking  Code: BO * EXAM: CT ABDOMEN AND PELVIS WITH CONTRAST TECHNIQUE: Multidetector CT imaging of the abdomen and pelvis was performed using the standard protocol following bolus administration of intravenous contrast. RADIATION DOSE REDUCTION: This exam was performed according to the departmental dose-optimization program which includes automated exposure control, adjustment of the mA and/or kV according to patient size and/or use of iterative reconstruction technique. CONTRAST:  OMNIPAQUE IOHEXOL 300 MG/ML  SOLN COMPARISON:  None Available. FINDINGS: Lower Chest: No acute findings. Hepatobiliary: A few tiny hepatic cysts are seen, however, there are multiple hypovascular masses involving the right and left hepatic lobes, largest in the central right hepatic lobe measuring 1.9 x 1.5 cm on image 26/2. These are consistent with liver metastases. Gallbladder is unremarkable. No evidence of biliary ductal dilatation. Pancreas:  No mass or inflammatory changes. Spleen: Within normal limits in size and appearance. Adrenals/Urinary Tract: No suspicious masses identified. No evidence of ureteral calculi or hydronephrosis. Stomach/Bowel: Distal small bowel obstruction, with transition point in the right lower quadrant, likely due to peritoneal carcinoma. Rectal intussusception incidentally noted. Vascular/Lymphatic: Mild retroperitoneal lymphadenopathy in the left para-aortic and  aortocaval spaces. No acute vascular findings. Reproductive: A 1.6 cm peripherally calcified fibroid is seen in the uterine fundus. Poorly defined masslike soft tissue prominence is seen in the region of the cervix and lower uterine segment measuring 3.6 x 3.3 cm on image 63/2, possibly representing a primary cervical or endometrial carcinoma. Poorly defined soft tissue density is seen in both adnexal regions and along the uterine fundus, consistent with peritoneal carcinomatosis. Other: Mild ascites is seen. Peritoneal and omental soft tissue nodules and masses are seen throughout the abdomen and pelvis, consistent with peritoneal carcinomatosis. Largest conglomerate mass is seen in the right abdomen measuring approximately 6.7 x 3.4 cm on image 37/2. Musculoskeletal:  No suspicious bone lesions identified. IMPRESSION: Diffuse peritoneal carcinomatosis and mild ascites. Distal small bowel obstruction, with transition point in the right lower quadrant, likely due to peritoneal carcinomatosis. Masslike soft tissue prominence in the region of the lower uterine segment and cervix, possibly representing primary cervical or endometrial carcinoma. Liver metastases. Mild retroperitoneal lymphadenopathy, consistent with metastatic disease. Rectal intussusception incidentally noted. No definite lead mass identified. Electronically Signed   By: Danae Orleans M.D.   On: 02/04/2024 11:34   Ms. Stiefel was interviewed and examined.  She tolerated the chemotherapy without significant acute toxicity.  She started G-CSF yesterday.  She reports bowel movements and flatus.  No new complaint.  Recommendations: Continue G-CSF Management of the small bowel obstruction per the medical and surgical services Continue heparin, transition to apixaban when she is tolerating oral medications Oncology will continue following her in the hospital and outpatient follow-up will be scheduled at the Cancer center.

## 2024-02-14 NOTE — Progress Notes (Signed)
 PHARMACY - ANTICOAGULATION CONSULT NOTE  Pharmacy Consult for heparin  Indication: acute pulmonary embolus and DVT  Allergies  Allergen Reactions   Calcium-Containing Compounds Other (See Comments)    Upsets the stomach, so it is not taken very often    Patient Measurements: Height: 5\' 4"  (162.6 cm) Weight: 53.9 kg (118 lb 14.4 oz) IBW/kg (Calculated) : 54.7 Heparin Dosing Weight: 54 kg  Vital Signs: Temp: 98 F (36.7 C) (02/16 2155) Temp Source: Oral (02/16 2155) BP: 128/59 (02/16 2155) Pulse Rate: 65 (02/16 2155)  Labs: Recent Labs    02/11/24 0337 02/12/24 0230 02/12/24 1039 02/13/24 0557 02/13/24 1352 02/14/24 0200  HGB 10.7* 10.1*  --  9.9*  --  9.9*  HCT 33.5* 31.7*  --  31.8*  --  30.6*  PLT 304 318  --  329  --  320  HEPARINUNFRC  --  0.65   < > 0.68 0.78* 0.45  CREATININE 0.84 0.73  --  0.73  --   --    < > = values in this interval not displayed.    Estimated Creatinine Clearance: 43 mL/min (by C-G formula based on SCr of 0.73 mg/dL).   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 Chest CT on 2/14 showed incidental acute PE in the "the distal right pulmonary artery and lobar and segmental right upper lobe and right middle lobe pulmonary artery branches" with borderline elevated RV/LV ratio.  LE doppler on 02/12/24 resulted back with positive acute DVT in the left peroneal veins, and left posterior tibial veins. She's currently on heparin drip for VTE treatment.  Significant events:  - 2/15: received carbo/paclitaxel   Today, 02/14/2024: -Heparin level = 0.45 therapeutic with heparin infusion at 750 units/hr -CBC stable -No bleeding complications noted per RN  Goal of  Therapy:  Heparin level 0.3-0.7 units/ml Monitor platelets by anticoagulation protocol: Yes   Plan:  -Continue heparin infusion to 750 units/hr -Daily CBC & heparin level -Monitor for s/sx bleeding    Maryellen Pile, PharmD Clinical Pharmacist 02/14/2024 2:44 AM

## 2024-02-14 NOTE — Progress Notes (Signed)
 [facility] one testing requested on accession number [id number]

## 2024-02-14 NOTE — Progress Notes (Signed)
 Mobility Specialist - Progress Note   02/14/24 1506  Mobility  Activity Ambulated with assistance to bathroom;Ambulated with assistance in hallway  Level of Assistance Modified independent, requires aide device or extra time  Assistive Device Front wheel walker;Other (Comment) (IV Pole)  Distance Ambulated (ft) 500 ft  Range of Motion/Exercises Active  Activity Response Tolerated well  Mobility Referral Yes  Mobility visit 1 Mobility  Mobility Specialist Start Time (ACUTE ONLY) 1450  Mobility Specialist Stop Time (ACUTE ONLY) 1506  Mobility Specialist Time Calculation (min) (ACUTE ONLY) 16 min   Pt was found in bed and agreeable to ambulate after bathroom use. No complaints throughout session. At EOS returned to bed with all needs met. Call bell in reach.  Billey Chang Mobility Specialist

## 2024-02-14 NOTE — Progress Notes (Signed)
 PHARMACY - TOTAL PARENTERAL NUTRITION CONSULT NOTE   Indication: Prolonged ileus  Assessment: 89 year old female with SBO likely in setting of malignancy. CT abd/pelvis with peritoneal carcinomatosis, masslike soft tissue in the ureter/cervix. S/p endometrial biopsy by GynOnc, pathology pending for definitive diagnosis. Pharmacy consulted for TPN management.  Glucose / Insulin: no hx DM - on sSSI q8h (used 2 unit in the past 24 hr) - CBGs ( goal < 150): wnl   - Dexamethasone 8 mg daily x2 doses on 2/13 and 2/14 - dexamethasone 10 mg IV x1 with chemo on 2/15  Electrolytes: Na trending down with 135, CO2 slightly low 20; other lytes wnl, including corrCa Renal: scr stable, BUN trending up with 33 Hepatic:  - Alb low at 2.4 - AST/ALT up 68/64, Alk phos 143 - Tbili wnl  I/O:  - 700 mL  -UOP: 4x - emesis/MG output: 400 mL  GI Imaging: - 2/11 Abd xray: persistent but significantly improved small bowel dilatation when compared with prior - 2/13 abd XRAY: Worsening small bowel obstruction.  - 2/14 chest CT: incidental acute PE - 2/14 pelvis MRI: Abnormal amorphous soft tissue throughout lower uterine segment and cervix could represent primary uterine/cervical carcinoma or metastatic disease.Right greater than left adnexal soft tissue fullness likely represents ovarian metastasis. Ileocolic mesenteric nodal metastasis. Chronic or recurrent sigmoid/rectal intussusception since 02/04/2024. GI Surgeries / Procedures: NA  Central access: PICC TPN start date: 2/11  Nutritional Goals: Goal TPN rate is 70 mL/hr (provides 84 g of protein and 1696 kcals per day)  RD Assessment: Estimated Needs Total Energy Estimated Needs: 1625-1900 kcal Total Protein Estimated Needs: 70-90 g Total Fluid Estimated Needs: 25ml/kcal  Current Nutrition: NPO  Plan:  -Continue TPN at 70 mL/hr -Electrolytes in TPN:  Increase Na to 80 mEq/L K 40 mEq/L Ca 3 mEq/L Mg 5 mEq/L Phos 15 mmol/L Cl:Ac ratio  1:2 -Add standard MVI and trace elements to TPN -Continue Sensitive q8h SSI and adjust as needed  -Monitor TPN labs on Mon/Thurs at minimum, more frequently as needed - CMET, phos and mag on 2/18  Lucia Gaskins, PharmD, BCPS Clinical Pharmacist 02/14/2024 7:32 AM

## 2024-02-14 NOTE — Progress Notes (Signed)
 GYN Onc Progress Note  Subjective: Patient reports doing well. She felt she tolerated the chemotherapy infusion well. Very little flatus passed. Small bowel movement yesterday. Feels distention is unchanged. Denies nausea or emesis. Denies shortness of breath. Friend at the bedside.    Objective: Vital signs in last 24 hours: Temp:  [97.6 F (36.4 C)-98.2 F (36.8 C)] 97.6 F (36.4 C) (02/17 1319) Pulse Rate:  [65-73] 73 (02/17 1319) Resp:  [16-18] 16 (02/17 1319) BP: (112-129)/(59-72) 129/72 (02/17 1319) SpO2:  [92 %-95 %] 92 % (02/17 1319) Last BM Date : 02/12/24  Intake/Output from previous day: 02/16 0701 - 02/17 0700 In: -  Out: 700 [Urine:300; Emesis/NG output:400]  Physical Examination (performed by Dr. Pricilla Holm): Gen: NAD, alert and oriented; NGT in place (currently clamped due to recent med use) CV: RRR, no murmurs or rubs Pulm: clear to auscultation bilaterally, no wheezes or rhonchi Abd: soft, moderately distended, hypoactive BS, nontender Ext: warm and well perfused, no edema or tenderness to palpation  Labs:    Latest Ref Rng & Units 02/14/2024    2:00 AM 02/13/2024    5:57 AM 02/12/2024    2:30 AM  CBC  WBC 4.0 - 10.5 K/uL 35.7  10.3  11.2   Hemoglobin 12.0 - 15.0 g/dL 9.9  9.9  16.1   Hematocrit 36.0 - 46.0 % 30.6  31.8  31.7   Platelets 150 - 400 K/uL 320  329  318       Latest Ref Rng & Units 02/14/2024    2:00 AM 02/13/2024    5:57 AM 02/12/2024    2:30 AM  BMP  Glucose 70 - 99 mg/dL 096  045  409   BUN 8 - 23 mg/dL 33  31  27   Creatinine 0.44 - 1.00 mg/dL 8.11  9.14  7.82   Sodium 135 - 145 mmol/L 135  139  139   Potassium 3.5 - 5.1 mmol/L 4.4  4.4  4.0   Chloride 98 - 111 mmol/L 107  110  110   CO2 22 - 32 mmol/L 20  23  22    Calcium 8.9 - 10.3 mg/dL 7.9  8.0  8.2    Echocardiogram performed 02/13/2024: EF 65-70%  Doppler from 02/12/24 with +DVT in LLE  Assessment: 89 y.o. with Stage IV gyn malignancy admitted with a malignant bowel obstruction,  s/p dose-reduced carboplatin and taxol on 02/12/2024 managed by Dr. Truett Perna. PE diagnosed on staging CT chest from 02/11/2024-currently on heparin drip.   Metastatic cancer: Based on Dr. 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  She has a pathologically enlarged left axillary lymph node which they had discussed potentially biopsying both for additional tissue diagnosis and next generation sequencing. Based on long discussions with the patient, her family, and Dr. Pricilla Holm and in line with her goals of care, plan to start dose reduced chemotherapy with carboplatin and paclitaxel today in the hopes of symptom improvement.  Malignant bowel obstruction: Plan to continue bowel rest with NG tube in place, GI prophylaxis, and dexamethasone.  Given anticipated prolonged period requiring n.p.o. status, patient was started on TPN last week.  Dr. Pricilla Holm discussed the possibility of future G-tube placement to allow the patient to go home from the hospital although she would favor continued conservative management in the hope that chemotherapy may help relieve her malignant obstruction.  She has had some GI function over the  last few days already. Per Dr. Pricilla Holm, agreeable with starting octreotide recommended by Dr. Patterson Hammersmith with Palliative Care.  Pulmonary embolism: Dr. Pricilla Holm discussed with the patient hypercoagulable state that cancer causes. Doppler from 02/12/2024 +for DVT in LLE. Recommend no use of SCDs.  Patient started on heparin drip 02/11/24.  Goal would be to transition to DOAC once she is able to take p.o. and ready for discharge.  Continue with current plan of care.    LOS: 10 days    Doylene Bode 02/14/2024, 1:49 PM

## 2024-02-15 ENCOUNTER — Encounter: Payer: Self-pay | Admitting: Oncology

## 2024-02-15 ENCOUNTER — Telehealth (HOSPITAL_COMMUNITY): Payer: Self-pay | Admitting: Pharmacy Technician

## 2024-02-15 ENCOUNTER — Other Ambulatory Visit (HOSPITAL_COMMUNITY): Payer: Self-pay

## 2024-02-15 DIAGNOSIS — Z515 Encounter for palliative care: Secondary | ICD-10-CM | POA: Diagnosis not present

## 2024-02-15 DIAGNOSIS — C786 Secondary malignant neoplasm of retroperitoneum and peritoneum: Secondary | ICD-10-CM | POA: Diagnosis not present

## 2024-02-15 LAB — COMPREHENSIVE METABOLIC PANEL
ALT: 67 U/L — ABNORMAL HIGH (ref 0–44)
AST: 59 U/L — ABNORMAL HIGH (ref 15–41)
Albumin: 2.3 g/dL — ABNORMAL LOW (ref 3.5–5.0)
Alkaline Phosphatase: 140 U/L — ABNORMAL HIGH (ref 38–126)
Anion gap: 5 (ref 5–15)
BUN: 34 mg/dL — ABNORMAL HIGH (ref 8–23)
CO2: 23 mmol/L (ref 22–32)
Calcium: 8 mg/dL — ABNORMAL LOW (ref 8.9–10.3)
Chloride: 108 mmol/L (ref 98–111)
Creatinine, Ser: 0.77 mg/dL (ref 0.44–1.00)
GFR, Estimated: >60 mL/min (ref >60)
Glucose, Bld: 98 mg/dL (ref 70–99)
Potassium: 4.3 mmol/L (ref 3.5–5.1)
Sodium: 136 mmol/L (ref 135–145)
Total Bilirubin: <0.2 mg/dL (ref 0.0–1.2)
Total Protein: 5.4 g/dL — ABNORMAL LOW (ref 6.5–8.1)

## 2024-02-15 LAB — GLUCOSE, CAPILLARY
Glucose-Capillary: 111 mg/dL — ABNORMAL HIGH (ref 70–99)
Glucose-Capillary: 113 mg/dL — ABNORMAL HIGH (ref 70–99)
Glucose-Capillary: 142 mg/dL — ABNORMAL HIGH (ref 70–99)
Glucose-Capillary: 99 mg/dL (ref 70–99)

## 2024-02-15 LAB — HEPARIN LEVEL (UNFRACTIONATED)
Heparin Unfractionated: 0.21 [IU]/mL — ABNORMAL LOW (ref 0.30–0.70)
Heparin Unfractionated: 0.2 [IU]/mL — ABNORMAL LOW (ref 0.30–0.70)
Heparin Unfractionated: 0.33 [IU]/mL (ref 0.30–0.70)

## 2024-02-15 LAB — MAGNESIUM: Magnesium: 2.2 mg/dL (ref 1.7–2.4)

## 2024-02-15 LAB — PHOSPHORUS: Phosphorus: 2.8 mg/dL (ref 2.5–4.6)

## 2024-02-15 MED ORDER — TRAVASOL 10 % IV SOLN
INTRAVENOUS | Status: AC
Start: 2024-02-15 — End: 2024-02-16
  Filled 2024-02-15: qty 890.4

## 2024-02-15 MED ORDER — INSULIN ASPART 100 UNIT/ML IJ SOLN
0.0000 [IU] | Freq: Every day | INTRAMUSCULAR | Status: DC
  Administered 2024-02-16: 1 [IU] via SUBCUTANEOUS

## 2024-02-15 NOTE — Progress Notes (Signed)
 Daily Progress Note   Patient Name: Tracy Preston       Date: 02/15/2024 DOB: 09-02-1935  Age: 89 y.o. MRN#: [id number] Attending Physician: Miguel Rota, MD Primary Care Physician: Marden Noble, MD (Inactive) Admit Date: 02/04/2024 Length of Stay: 11 days  Reason for Consultation/Follow-up: Establishing goals of care  Subjective:   Reviewed EMR prior to presenting to bedside.  NG tube remains in place with    When presenting to bedside, patient laying comfortably in bed.  Patient denying any symptoms from chemotherapy at this time.  Patient does feel that she has passed some gas this am.    Noted palliative medicine team and continue to follow with patient's medical journey.  Thank patient for allowing palliative medicine provider to visit with her today.  Objective:   Vital Signs:  BP (!) 115/55 (BP Location: Left Arm)   Pulse 71   Temp 97.9 F (36.6 C) (Oral)   Resp 17   Ht 5\' 4"  (1.626 m)   Wt 53.9 kg   SpO2 93%   BMI 20.41 kg/m   Physical Exam:  General: NAD, alert Has NGT Cardiovascular: RRR, no edema in LE b/l Respiratory: no increased work of breathing noted, not in respiratory distress Skin: no rashes or lesions on visible skin Neuro: A&Ox4, following commands easily Psych: appropriately answers all questions  Imaging: I personally reviewed recent imaging.   Assessment & Plan:   Assessment: Patient is a 89 year old female with past medical history of hypertension, CKD, and family history of breast cancer who was admitted on 02/04/2024 for management of abdominal distention and vomiting for approximately 2 weeks.  Imaging concerning for new metastatic gynecological malignancy with small bowel obstruction.  Imaging also showed likely metastatic disease to liver and peritoneal carcinomatosis.  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  Gyn Onc and oncology consulted for recommendations.  Palliative medicine team consulted to assist with  complex medical decision making.   Recommendations/Plan: # Complex medical decision making/goals of care: -Discussed care with patient as detailed above in HPI. Patient has received initial round of chemotherapy today.  Based on outcomes and symptom burden from this, patient can then decide if she would want to proceed with further chemotherapy or discontinue aggressive medical interventions.  At this time continuing appropriate medical interventions.  Patient hoping to be able to follow-up with oncology and palliative medicine in the outpatient setting if cannot reach point where NG tube can be removed and she can maintain her oral intake.   -Patient has continued to state that quality of life is incredibly important to her.  Patient's priorities are that her symptoms to be managed and that she not be in pain.                 -  Code Status: Limited: Do not attempt resuscitation (DNR) -DNR-LIMITED -Do Not Intubate/DNI    # Symptom management Patient is receiving these palliative interventions for symptom management with an intent to improve quality of life.                 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  Receiving IV dexamethasone for symptom management.  Should it be needed, would consider addition of octreotide.      -  Patient continuing to receive IV dexamethasone 8 mg daily.  Based on plan for NG tube removal, will consider decreasing dose in upcoming days.  # Psycho-social/Spiritual Support:  - Support System: daughters, son-in-law, 8 grandchildren    # Discharge Planning:  To Be Determined  -Have placed outpatient PMT referral to follow-up at [facility] with hope patient will be able to complete this.  Discussed with: Patient, patient   Thank you for allowing the palliative care team to participate in the care [name].  Mod MDM Rosalin Hawking MD Palliative  Care Provider PMT # [phone]  If patient remains symptomatic despite maximum doses, please call PMT at [phone] between 0700 and 1900. Outside of these hours, please call attending, as PMT does not have night coverage.

## 2024-02-15 NOTE — Progress Notes (Signed)
 GYN Onc Progress Note  Subjective: Patient reports having reflux this am and she is concerned the NG tube may not be working, may be out too far. No significant flatus or BM yesterday or today. No nausea or emesis reported. Ambulating without difficulty. Voiding. Denies chest pain, dyspnea. In regards to wheezing noted on auscultation, patient reports being asymptomatic and planning to use IS more. She will let Dr. Nelson Chimes know of any changes with this. No needs voiced at this time.   Objective: Vital signs in last 24 hours: Temp:  [97.6 F (36.4 C)-97.9 F (36.6 C)] 97.9 F (36.6 C) (02/18 0621) Pulse Rate:  [68-74] 71 (02/18 0621) Resp:  [16-17] 17 (02/18 0621) BP: (112-129)/(55-72) 115/55 (02/18 0621) SpO2:  [92 %-97 %] 97 % (02/18 0621) Last BM Date : 02/13/24 (small)  Intake/Output from previous day: 02/17 0701 - 02/18 0700 In: 3062.4 [P.O.:180; I.V.:2882.4] Out: 800 [Emesis/NG output:800]  Physical Examination (performed by Dr. Pricilla Holm): Gen: NAD, alert and oriented; NGT-suction not currently running-RN adjusted and to check placement. With suction, GI contents coming through NG  CV: RRR, no murmurs or rubs Pulm: Mild inspiratory wheezing noted (pt asymptomatic), no rhonchi Abd: soft, moderately distended, active BS, nontender Ext: warm and well perfused, no edema or tenderness to palpation  Labs:    Latest Ref Rng & Units 02/14/2024    2:00 AM 02/13/2024    5:57 AM 02/12/2024    2:30 AM  CBC  WBC 4.0 - 10.5 K/uL 35.7  10.3  11.2   Hemoglobin 12.0 - 15.0 g/dL 9.9  9.9  09.8   Hematocrit 36.0 - 46.0 % 30.6  31.8  31.7   Platelets 150 - 400 K/uL 320  329  318       Latest Ref Rng & Units 02/15/2024    5:15 AM 02/14/2024    2:00 AM 02/13/2024    5:57 AM  BMP  Glucose 70 - 99 mg/dL 98  119  147   BUN 8 - 23 mg/dL 34  33  31   Creatinine 0.44 - 1.00 mg/dL 8.29  5.62  1.30   Sodium 135 - 145 mmol/L 136  135  139   Potassium 3.5 - 5.1 mmol/L 4.3  4.4  4.4   Chloride 98 - 111  mmol/L 108  107  110   CO2 22 - 32 mmol/L 23  20  23    Calcium 8.9 - 10.3 mg/dL 8.0  7.9  8.0    Echocardiogram performed 02/13/2024: EF 65-70%  Doppler from 02/12/24 with +DVT in LLE  Assessment: 89 y.o. 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 Truett Perna. PE diagnosed on staging CT chest from 02/11/2024-currently on heparin drip.   Metastatic cancer: Based on Dr. 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  She has a pathologically enlarged left axillary lymph node which they had discussed potentially biopsying both for additional tissue diagnosis and next generation sequencing. Based on long discussions with the patient, her family, and Dr. Pricilla Holm and in line with her goals of care, plan to start dose reduced chemotherapy with carboplatin and paclitaxel today in the hopes of symptom improvement.  Malignant bowel obstruction: Plan to continue bowel rest with NG tube in place, GI prophylaxis, and dexamethasone.  Given anticipated prolonged period requiring n.p.o. status, patient was started on TPN last  week.  Dr. Pricilla Holm discussed the possibility of future G-tube placement to allow the patient to go home from the hospital although she would favor continued conservative management in the hope that chemotherapy may help relieve her malignant obstruction.  She has had some GI function over the last few days already. Per Dr. Pricilla Holm, agreeable with starting octreotide recommended by Dr. Patterson Hammersmith with Palliative Care.  Pulmonary embolism: Dr. Pricilla Holm discussed with the patient hypercoagulable state that cancer causes. Doppler from 02/12/2024 +for DVT in LLE. Recommend no use of SCDs.  Patient started on heparin drip 02/11/24.  Goal would be to transition to DOAC once she is able to take p.o. and ready for  discharge.  RN advised a xray to check placement can be ordered if needed for NG tube. Patient encouraged to use IS. Patient deferring discussion about wheezing/treatment to Dr. Nelson Chimes. Continue with current plan of care.    LOS: 11 days    Tracy Preston 02/15/2024, 8:30 AM

## 2024-02-15 NOTE — Progress Notes (Addendum)
 Tracy Preston   DOB:02-02-1935   ZO#:[id number]      ASSESSMENT & PLAN:  1.  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 - Newly diagnosed February 2025 - CT scan done 02/04/2024 shows diffuse peritoneal carcinomatosis and mild ascites. - Pelvic ultrasound on 02/05/2024 shows 2 cm heterogeneous area at the uterine fundus, endometrial malignancy not excluded. - S/p paracentesis 02/05/2024, 100 mL of yellow fluid removed. 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 -MRI pelvis and CT chest for staging done on 02/11/2024.  Shows abnormal soft tissue throughout the lower uterine and cervix could represent primary uterine/cervical carcinoma or mets. - Chemotherapy with carboplatin and Taxol initiated on 02/12/2024.  Tolerated well. - On Granix 300 mcg daily x 5 started on 02/13/2024.  WBC somewhat elevated, likely due to Granix.  However will continue Granix for now. - GYN Onc following closely.  Consideration for starting octreotide. - Palliative team also following - Medical oncology/Dr. Truett Perna following closely.   2.  Small bowel obstruction -Persistent small bowel obstruction - Distal SBO with transition point in the right lower quadrant  - Likely due to peritoneal carcinomatosis - NGT remains intact to wall suction - TPN infusing well - Management per surgery   3.  Acute pulmonary embolism - Per CT of the chest for staging purposes done on 02/11/2024.  This showed incidental acute PE involving the distal right pulmonary artery and lobar and segmental RUL and RML pulmonary artery branches.  Small right pleural effusion. - On IV heparin, continue per protocol - Monitor closely for active bleeding   4.  Anemia, normocytic - Hemoglobin 9.9 stable.  Please repeat CBC today. - Transfuse PRBC for hemoglobin <7.0.  - Monitor CBC with  differential closely   4.  Hypertension - On losartan at home - Monitor BP counts - Primary team following    Code Status DNR-limited  Subjective:  Patient seen awake and alert laying in bed.  Reports small flatus and last BM was 2 days ago.  NG tube currently clamped as she just took her antihypertensive meds.  + Bowel sounds noted.  No acute complaints offered.  No acute distress noted.  Objective:  Vitals:   02/15/24 0621 02/15/24 0934  BP: (!) 115/55   Pulse: 71   Resp: 17   Temp: 97.9 F (36.6 C)   SpO2: 97% 93%     Intake/Output Summary (Last 24 hours) at 02/15/2024 9811 Last data filed at 02/15/2024 0800 Gross per 24 hour  Intake 3062.42 ml  Output 400 ml  Net 2662.42 ml     REVIEW OF SYSTEMS:   Constitutional: + Fatigue, denies fevers, chills or abnormal night sweats Eyes: Denies blurriness of vision, double vision or watery eyes Ears, nose, mouth, throat, and face: Denies mucositis or sore throat Respiratory: Denies cough, dyspnea or wheezes Cardiovascular: Denies palpitation, chest discomfort or lower extremity swelling Gastrointestinal: + Abdominal distention  skin: Denies abnormal skin rashes Lymphatics: Denies new lymphadenopathy or easy bruising Neurological: Denies numbness, tingling or new weaknesses Behavioral/Psych: Mood is stable, no new changes  All other systems were reviewed with the patient and are negative.  PHYSICAL EXAMINATION: ECOG PERFORMANCE STATUS: 2 - Symptomatic, <50% confined to bed  Vitals:   02/15/24 0621 02/15/24 0934  BP: (!) 115/55   Pulse: 71   Resp: 17   Temp: 97.9 F (36.6 C)   SpO2: 97% 93%  Filed Weights   02/10/24 0437 02/11/24 5956 02/11/24 0900  Weight: 125 lb (56.7 kg) 118 lb 14.4 oz (53.9 kg) 118 lb 14.4 oz (53.9 kg)    GENERAL: alert, no distress and comfortable SKIN: skin color, texture, turgor are normal, no rashes or significant lesions EYES: normal, conjunctiva are pink and non-injected, sclera  clear OROPHARYNX: no exudate, no erythema and lips, buccal mucosa, and tongue normal  NECK: supple, thyroid normal size, non-tender, without nodularity LYMPH: no palpable lymphadenopathy in the cervical, axillary or inguinal LUNGS: clear to auscultation and percussion with normal breathing effort HEART: regular rate & rhythm and no murmurs and no lower extremity edema ABDOMEN: + Abdominal distention + bowel sounds auscultated MUSCULOSKELETAL: no cyanosis of digits and no clubbing  PSYCH: alert & oriented x 3 with fluent speech NEURO: no focal motor/sensory deficits   All questions were answered. The patient knows to call the clinic with any problems, questions or concerns.   The total time spent in the appointment was 40 minutes encounter with patient including review of chart and various tests results, discussions about plan of care and coordination of care plan  Dawson Bills, NP 02/15/2024 9:58 AM    Labs Reviewed:  Lab Results  Component Value Date   WBC 35.7 (H) 02/14/2024   HGB 9.9 (L) 02/14/2024   HCT 30.6 (L) 02/14/2024   MCV 97.5 02/14/2024   PLT 320 02/14/2024   Recent Labs    02/10/24 0345 02/11/24 0337 02/13/24 0557 02/14/24 0200 02/15/24 0515  NA 140   < > 139 135 136  K 3.9   < > 4.4 4.4 4.3  CL 109   < > 110 107 108  CO2 25   < > 23 20* 23  GLUCOSE 124*   < > 115* 119* 98  BUN 24*   < > 31* 33* 34*  CREATININE 0.87   < > 0.73 0.75 0.77  CALCIUM 8.2*   < > 8.0* 7.9* 8.0*  GFRNONAA >60   < > >60 >60 >60  PROT 5.6*  --   --  5.5* 5.4*  ALBUMIN 2.2*  --   --  2.4* 2.3*  AST 31  --   --  68* 59*  ALT 24  --   --  64* 67*  ALKPHOS 90  --   --  143* 140*  BILITOT 0.3  --   --  0.4 <0.2   < > = values in this interval not displayed.    Studies Reviewed:  ECHOCARDIOGRAM COMPLETE Result Date: 02/13/2024    ECHOCARDIOGRAM REPORT   Patient Name:   Tracy Preston Date of Exam: 02/13/2024 Medical Rec #:  [id number]          Height:       64.0 in Accession #:     [id number]         Weight:       118.9 lb Date of Birth:  1935-04-14          BSA:          1.568 m Patient Age:    88 years           BP:           142/72 mmHg Patient Gender: F                  HR:           71 bpm. Exam Location:  Inpatient Procedure: 2D Echo,  Cardiac Doppler and Color Doppler (Both Spectral and Color            Flow Doppler were utilized during procedure). Indications:    Cardiomyopathy- Unspecified I42.9  History:        Patient has no prior history of Echocardiogram examinations.                 Risk Factors:Hypertension.  Sonographer:    Lucendia Herrlich RCS Referring Phys: [id number] Doreen Salvage AMIN IMPRESSIONS  1. Left ventricular ejection fraction, by estimation, is 65 to 70%. The left ventricle has normal function. The left ventricle has no regional wall motion abnormalities. Left ventricular diastolic parameters were normal.  2. Right ventricular systolic function is normal. The right ventricular size is normal. There is normal pulmonary artery systolic pressure. The estimated right ventricular systolic pressure is 23.8 mmHg.  3. The mitral valve is grossly normal. No evidence of mitral valve regurgitation. No evidence of mitral stenosis.  4. The aortic valve is calcified. Aortic valve regurgitation is trivial. Aortic valve sclerosis/calcification is present, without any evidence of aortic stenosis.  5. The inferior vena cava is normal in size with greater than 50% respiratory variability, suggesting right atrial pressure of 3 mmHg. FINDINGS  Left Ventricle: Left ventricular ejection fraction, by estimation, is 65 to 70%. The left ventricle has normal function. The left ventricle has no regional wall motion abnormalities. Strain imaging was not performed. The left ventricular internal cavity  size was normal in size. There is no left ventricular hypertrophy. Left ventricular diastolic parameters were normal. Right Ventricle: The right ventricular size is normal. No increase in right  ventricular wall thickness. Right ventricular systolic function is normal. There is normal pulmonary artery systolic pressure. The tricuspid regurgitant velocity is 2.28 m/s, and  with an assumed right atrial pressure of 3 mmHg, the estimated right ventricular systolic pressure is 23.8 mmHg. Left Atrium: Left atrial size was normal in size. Right Atrium: Right atrial size was normal in size. Pericardium: There is no evidence of pericardial effusion. Mitral Valve: The mitral valve is grossly normal. No evidence of mitral valve regurgitation. No evidence of mitral valve stenosis. Tricuspid Valve: The tricuspid valve is grossly normal. Tricuspid valve regurgitation is trivial. No evidence of tricuspid stenosis. Aortic Valve: The aortic valve is calcified. Aortic valve regurgitation is trivial. Aortic regurgitation PHT measures 299 msec. Aortic valve sclerosis/calcification is present, without any evidence of aortic stenosis. Aortic valve mean gradient measures 9.0 mmHg. Aortic valve peak gradient measures 11.1 mmHg. Aortic valve area, by VTI measures 1.23 cm. Pulmonic Valve: The pulmonic valve was grossly normal. Pulmonic valve regurgitation is trivial. No evidence of pulmonic stenosis. Aorta: The aortic root and ascending aorta are structurally normal, with no evidence of dilitation. Venous: The inferior vena cava is normal in size with greater than 50% respiratory variability, suggesting right atrial pressure of 3 mmHg. IAS/Shunts: The atrial septum is grossly normal. Additional Comments: 3D imaging was not performed. Mild ascites is present.  LEFT VENTRICLE PLAX 2D LVIDd:         4.00 cm   Diastology LVIDs:         2.60 cm   LV e' medial:    9.25 cm/s LV PW:         0.90 cm   LV E/e' medial:  6.4 LV IVS:        1.00 cm   LV e' lateral:   12.20 cm/s LVOT diam:     1.80 cm  LV E/e' lateral: 4.9 LV SV:         48 LV SV Index:   31 LVOT Area:     2.54 cm  RIGHT VENTRICLE             IVC RV S prime:     11.00 cm/s   IVC diam: 1.20 cm TAPSE (M-mode): 1.8 cm LEFT ATRIUM             Index        RIGHT ATRIUM           Index LA diam:        3.70 cm 2.36 cm/m   RA Area:     12.50 cm LA Vol (A2C):   29.1 ml 18.55 ml/m  RA Volume:   29.10 ml  18.55 ml/m LA Vol (A4C):   40.0 ml 25.50 ml/m LA Biplane Vol: 34.3 ml 21.87 ml/m  AORTIC VALVE                     PULMONIC VALVE AV Area (Vmax):    1.61 cm      PR End Diast Vel: 2.12 msec AV Area (Vmean):   1.25 cm AV Area (VTI):     1.23 cm AV Vmax:           166.67 cm/s AV Vmean:          138.000 cm/s AV VTI:            0.392 m AV Peak Grad:      11.1 mmHg AV Mean Grad:      9.0 mmHg LVOT Vmax:         105.50 cm/s LVOT Vmean:        67.950 cm/s LVOT VTI:          0.190 m LVOT/AV VTI ratio: 0.48 AI PHT:            299 msec  AORTA Ao Root diam: 3.10 cm Ao Asc diam:  2.80 cm MITRAL VALVE               TRICUSPID VALVE MV Area (PHT): 3.27 cm    TR Peak grad:   20.8 mmHg MV Decel Time: 232 msec    TR Vmax:        228.00 cm/s MV E velocity: 59.60 cm/s MV A velocity: 76.60 cm/s  SHUNTS MV E/A ratio:  0.78        Systemic VTI:  0.19 m                            Systemic Diam: 1.80 cm Lennie Odor MD Electronically signed by Lennie Odor MD Signature Date/Time: 02/13/2024/8:53:15 PM    Final    VAS [place] LOWER EXTREMITY VENOUS (DVT) Result Date: 02/12/2024  Lower Venous DVT Study Patient Name:  SHAWNTELLE UNGAR  Date of Exam:   02/12/2024 Medical Rec #: [id number]           Accession #:    [id number] Date of Birth: 08/08/1935           Patient Gender: F Patient Age:   85 years Exam Location:  [facility] Procedure:      VAS [place] LOWER EXTREMITY VENOUS (DVT) Referring Phys: Stephania Fragmin --------------------------------------------------------------------------------  Indications: Pulmonary embolism.  Risk Factors: Newly diagnosed peritoneal carcinomatosis. Comparison Study: No previous exams Performing Technologist: Jody Hill RVT, RDMS  Examination Guidelines: A complete evaluation  includes B-mode imaging, spectral Doppler, color Doppler, and power Doppler as needed of all accessible portions of each vessel. Bilateral testing is considered an integral part of a complete examination. Limited examinations for reoccurring indications may be performed as noted. The reflux portion of the exam is performed with the patient in reverse Trendelenburg.  +---------+---------------+---------+-----------+----------+--------------+ RIGHT    CompressibilityPhasicitySpontaneityPropertiesThrombus Aging +---------+---------------+---------+-----------+----------+--------------+ CFV      Full           Yes      No                                  +---------+---------------+---------+-----------+----------+--------------+ SFJ      Full                                                        +---------+---------------+---------+-----------+----------+--------------+ FV Prox  Full           Yes      Yes                                 +---------+---------------+---------+-----------+----------+--------------+ FV Mid   Full           Yes      Yes                                 +---------+---------------+---------+-----------+----------+--------------+ FV DistalFull           Yes      Yes                                 +---------+---------------+---------+-----------+----------+--------------+ PFV      Full                                                        +---------+---------------+---------+-----------+----------+--------------+ POP      Full           Yes      Yes                                 +---------+---------------+---------+-----------+----------+--------------+ PTV      Full                                                        +---------+---------------+---------+-----------+----------+--------------+ PERO     Full                                                         +---------+---------------+---------+-----------+----------+--------------+   +---------+---------------+---------+-----------+----------+--------------+ LEFT     CompressibilityPhasicitySpontaneityPropertiesThrombus  Aging +---------+---------------+---------+-----------+----------+--------------+ CFV      Full           Yes      Yes                                 +---------+---------------+---------+-----------+----------+--------------+ SFJ      Full                                                        +---------+---------------+---------+-----------+----------+--------------+ FV Prox  Full           Yes      Yes                                 +---------+---------------+---------+-----------+----------+--------------+ FV Mid   Full           Yes      Yes                                 +---------+---------------+---------+-----------+----------+--------------+ FV DistalFull           Yes      Yes                                 +---------+---------------+---------+-----------+----------+--------------+ PFV      Full                                                        +---------+---------------+---------+-----------+----------+--------------+ POP      Full           Yes      Yes                                 +---------+---------------+---------+-----------+----------+--------------+ PTV      None           No       No                   Acute          +---------+---------------+---------+-----------+----------+--------------+ PERO     None           No       No                   Acute          +---------+---------------+---------+-----------+----------+--------------+     Summary: BILATERAL: -No evidence of popliteal cyst, bilaterally. RIGHT: - There is no evidence of deep vein thrombosis in the lower extremity.  LEFT: - Findings consistent with acute deep vein thrombosis involving the left peroneal veins, and left posterior tibial veins.    *See table(s) above for measurements and observations. Electronically signed by Carolynn Sayers on 02/12/2024 at 8:18:12 PM.    Final    CT CHEST W CONTRAST Addendum Date: 02/11/2024 ADDENDUM REPORT: 02/11/2024 15:54 ADDENDUM: Critical Value/emergent results were called  by telephone at the time of interpretation on 02/11/2024 at 3:54 pm to provider Eugene Garnet, MD, who verbally acknowledged these results. Electronically Signed   By: Delbert Phenix M.D.   On: 02/11/2024 15:54   Result Date: 02/11/2024 CLINICAL DATA:  Peritoneal carcinomatosis. Chest staging. * Tracking Code: BO * EXAM: CT CHEST WITH CONTRAST TECHNIQUE: Multidetector CT imaging of the chest was performed during intravenous contrast administration. RADIATION DOSE REDUCTION: This exam was performed according to the departmental dose-optimization program which includes automated exposure control, adjustment of the mA and/or kV according to patient size and/or use of iterative reconstruction technique. CONTRAST:  75mL OMNIPAQUE IOHEXOL 300 MG/ML  SOLN COMPARISON:  02/04/2024 CT abdomen/pelvis. 02/08/2024 chest radiograph. FINDINGS: Cardiovascular: Normal heart size. No significant pericardial effusion/thickening. Left anterior descending coronary atherosclerosis. Right PICC terminates in the middle third of the SVC. Atherosclerotic nonaneurysmal thoracic aorta. Incidental acute pulmonary embolism involving the distal right pulmonary artery and lobar and segmental right upper lobe and right middle lobe pulmonary artery branches. No saddle embolus. Normal caliber main pulmonary artery. RV/LV ratio 1.0. Mediastinum/Nodes: No significant thyroid nodules. Fluid level in the mid to lower thoracic esophagus with enteric tube terminating in the proximal stomach. No right axillary adenopathy. Mild left axillary adenopathy up to 1.1 cm (series 2/image 61). No pathologically enlarged mediastinal or hilar nodes. Lungs/Pleura: No pneumothorax. Small layering  right pleural effusion. No left pleural effusion. Calcified 1 cm posterior right lower lobe granuloma. Mild dependent bilateral lower lobe atelectasis. No acute consolidative airspace disease, lung masses or significant pulmonary nodules. Upper abdomen: Moderate upper abdominal ascites with nodular soft tissue caking in the visualized anterior left upper peritoneum, as seen on recent CT abdomen study. Several hypodense liver lesions scattered throughout the visualized liver, unchanged, largest 1.6 cm in the inferior liver on series 2/image 155. Musculoskeletal: No aggressive appearing focal osseous lesions. Mild thoracic spondylosis. IMPRESSION: 1. Incidental acute pulmonary embolism involving the distal right pulmonary artery and lobar and segmental right upper lobe and right middle lobe pulmonary artery branches. No saddle embolus. Normal caliber main pulmonary artery. Borderline elevated RV/LV Ratio = 1.0. 2. Small layering right pleural effusion. Mild dependent bilateral lower lobe atelectasis. 3. Mild left axillary adenopathy, cannot exclude metastatic disease. No other evidence of metastatic disease in the chest. 4. One vessel coronary atherosclerosis. 5. Moderate upper abdominal ascites with nodular soft tissue caking in the visualized anterior left upper peritoneum, as seen on recent CT abdomen study, compatible with peritoneal carcinomatosis. Several hypodense liver lesions scattered throughout the visualized liver, unchanged, suspicious for liver metastases. Electronically Signed: By: Delbert Phenix M.D. On: 02/11/2024 15:28   MR PELVIS W WO CONTRAST Result Date: 02/11/2024 CLINICAL DATA:  "Occult malignancy". Peritoneal carcinomatosis on abdominopelvic CT with bowel obstruction. EXAM: MRI PELVIS WITHOUT AND WITH CONTRAST TECHNIQUE: Multiplanar multisequence MR imaging of the pelvis was performed both before and after administration of intravenous contrast. CONTRAST:  5mL GADAVIST GADOBUTROL 1 MMOL/ML IV  SOLN COMPARISON:  CT of 02/04/2024 FINDINGS: Urinary Tract: Primarily decompressed urinary bladder. Left renal sinus cysts without hydronephrosis. Bowel: Fluid-filled small bowel loops within the upper and mid pelvis measure up to 2.0 cm on 19/17. Improved from 3.4 cm on the prior exam. Again identified is an intussusception of the sigmoid into the rectum. There is mild hyperenhancement within the lead point including on 41/17, without restricted diffusion in this area. Vascular/Lymphatic: Ileocolic mesenteric adenopathy including at 2.2 x 1.8 cm on 08/17. No pelvic sidewall adenopathy. Reproductive: The endometrium is  thickened for age including at 1.0 cm on 18/4. This is followed to the level of the cervix and lower uterine segment, where soft tissue fullness is again identified including on 18/4 and 21/3. Correlate restricted diffusion on 22/11. Example 3.7 x 3.7 cm on 31/17. There is amorphous soft tissue fullness within both adnexa, greater right than left. Example restricted diffusion on 17/11. The right adnexal soft tissue fullness measures 3.3 x 3.3 cm on 18/17. No separate ovarian tissue identified. Other: Peritoneal carcinomatosis, as evidenced by diffuse peritoneal enhancement and restricted diffusion (example 22/11). Small volume pelvic fluid. Musculoskeletal: No acute osseous abnormality. IMPRESSION: 1. Abnormal amorphous soft tissue throughout lower uterine segment and cervix could represent primary uterine/cervical carcinoma or metastatic disease. 2. Diffuse peritoneal carcinomatosis as better imaged on dedicated CT. Ileocolic mesenteric nodal metastasis. 3. Right greater than left adnexal soft tissue fullness likely represents ovarian metastasis. 4. Chronic or recurrent sigmoid/rectal intussusception since 02/04/2024. Vague hyperenhancement in the region of the lead point, without dominant mass. Recommend physical exam correlation to exclude either primary rectosigmoid carcinoma or serosal sigmoid  implant lead point. 5. Small volume pelvic fluid. 6. Improved small bowel dilatation since prior CT. Electronically Signed   By: Jeronimo Greaves M.D.   On: 02/11/2024 15:42   DG Abd 1 View Result Date: 02/10/2024 CLINICAL DATA:  [id number] Abdominal distention [id number] [phone] SBO (small bowel obstruction) (HCC) [id number] EXAM: ABDOMEN - 1 VIEW COMPARISON:  02/08/2024 FINDINGS: NG tube terminates within the proximal stomach. Worsening small bowel dilation in the mid abdomen measuring up to 5.3 cm (previously 3.3 cm. Paucity of bowel gas within the colon. No gross free intraperitoneal air. IMPRESSION: 1. Worsening small bowel obstruction. 2. NG tube terminates within the proximal stomach. Recommend advancement. Electronically Signed   By: Duanne Guess D.O.   On: 02/10/2024 09:35   DG Chest 1 View Result Date: 02/08/2024 CLINICAL DATA:  PICC line placement EXAM: CHEST  1 VIEW COMPARISON:  02/05/2024 FINDINGS: Right PICC line in place with the tip in the SVC. NG tube is in the stomach. Heart and mediastinal contours within normal limits. No confluent opacities or effusions. No acute bony abnormality. IMPRESSION: Right PICC line tip in the SVC. No active cardiopulmonary disease. Electronically Signed   By: Charlett Nose M.D.   On: 02/08/2024 17:51   [place] EKG SITE RITE Result Date: 02/08/2024 If Site Rite image not attached, placement could not be confirmed due to current cardiac rhythm.  DG Abd Portable 1V Result Date: 02/08/2024 CLINICAL DATA:  Check gastric catheter placement EXAM: PORTABLE ABDOMEN - 1 VIEW COMPARISON:  02/04/2024 FINDINGS: Scattered large and small bowel gas is noted. Mild small bowel dilatation is noted significantly improved when compared with the prior exam. Gastric catheter is noted with the tip in the stomach. No free air is noted. IMPRESSION: Persistent but significantly improved small bowel dilatation when compared with the prior exam. Electronically Signed   By: Alcide Clever M.D.   On:  02/08/2024 09:34   US Paracentesis Result Date: 02/05/2024 INDICATION: 89 year old with new abdominal distention, ascites. Request made for diagnostic and therapeutic paracentesis. EXAM: ULTRASOUND GUIDED DIAGNOSTIC  PARACENTESIS MEDICATIONS: 10 mL 1% lidocaine. COMPLICATIONS: None immediate. PROCEDURE: Informed written consent was obtained from the patient after a discussion of the risks, benefits and alternatives to treatment. A timeout was performed prior to the initiation of the procedure. Initial ultrasound scanning demonstrates a small amount of ascites within the left lateral abdomen interlooped with bowel. The left lateral  abdomen was prepped and draped in the usual sterile fashion. 1% lidocaine was used for local anesthesia. Following this, a 19 gauge, 7-cm, Yueh catheter was introduced. An ultrasound image was saved for documentation purposes. The paracentesis was performed. The catheter was removed and a dressing was applied. The patient tolerated the procedure well without immediate post procedural complication. FINDINGS: A total of approximately 100 mL of yellow fluid was removed. Samples were sent to the laboratory as requested by the clinical team. IMPRESSION: Successful ultrasound-guided paracentesis yielding 100 mL of peritoneal fluid. Performed by: Loyce Dys PA-C Electronically Signed   By: Malachy Moan M.D.   On: 02/05/2024 14:31   US PELVIC COMPLETE WITH TRANSVAGINAL Result Date: 02/05/2024 CLINICAL DATA:  89 year old female with ascites, peritoneal carcinomatosis on CT Abdomen and Pelvis yesterday. EXAM: TRANSABDOMINAL AND TRANSVAGINAL ULTRASOUND OF PELVIS TECHNIQUE: Both transabdominal and transvaginal ultrasound examinations of the pelvis were performed. Transabdominal technique was performed for global imaging of the pelvis including uterus, ovaries, adnexal regions, and pelvic cul-de-sac. It was necessary to proceed with endovaginal exam following the transabdominal exam to  visualize the ovaries. COMPARISON:  CT Abdomen and Pelvis 02/04/2024. FINDINGS: Uterus Measurements: 6.9 x 3.9 x 3.9 cm = volume: 41 mL. No obvious myometrial mass. Endometrium Heterogeneous hypoechoic area at the fundus endometrium (image 49 of series 1). This area encompasses about 2 cm (image 55). See also cine series 3 images. Otherwise the echogenic endometrial stripe is up to 5 mm. Right ovary Measurements: 3.5 x 2.1 x 2.8 cm = volume: 11 mL. No ovarian mass identified. Left ovary Measurements: 2.3 x 1.5 x 1.7 cm = volume: 3 mL. No ovarian mass identified. Other findings Small volume ascites again visible in the pelvis. IMPRESSION: 1. No ovarian mass identified by ultrasound. 2. There is a 2 cm heterogeneous area at the uterine fundus which is favored to be endometrial related. Elsewhere the endometrial stripe is about 5 mm. An Endometrial Malignancy is not excluded. 3. Ascites, in conjunction with the extensive additional abdominal and pelvic abnormalities demonstrated by CT yesterday. Electronically Signed   By: Odessa Fleming M.D.   On: 02/05/2024 12:17   DG Chest Portable 1 View Result Date: 02/05/2024 CLINICAL DATA:  NG tube replacement confirmation EXAM: PORTABLE CHEST - 1 VIEW COMPARISON:  02/04/2024 FINDINGS: Cardiomediastinal silhouette and pulmonary vasculature are within normal limits. Lungs are clear. Nasogastric tube extends to the left upper quadrant the side hole located at the level of the gastroesophageal junction. It is slightly retracted compared to prior exam. IMPRESSION: Nasogastric tube terminates in the left upper quadrant in the expected site of the stomach. The side hole of the NG tube is located at the level of the gastroesophageal junction. Electronically Signed   By: Acquanetta Belling M.D.   On: 02/05/2024 07:38   DG Chest Portable 1 View Result Date: 02/04/2024 CLINICAL DATA:  NG tube placement. Abdominal discomfort, heartburn, and nausea. EXAM: PORTABLE CHEST 1 VIEW COMPARISON:  None  Available. FINDINGS: The heart size and mediastinal contours are within normal limits. There is atherosclerotic calcification of the aorta. No consolidation, effusion, or pneumothorax. An enteric tube terminates in the stomach. No acute osseous abnormality. IMPRESSION: 1. No active disease. 2. Enteric tube terminates in the stomach. Electronically Signed   By: Thornell Sartorius M.D.   On: 02/04/2024 21:20   CT ABDOMEN PELVIS W CONTRAST Result Date: 02/04/2024 CLINICAL DATA:  Abdominal pain, bloating, nausea and vomiting for 2 weeks. * Tracking Code: BO * EXAM: CT ABDOMEN  AND PELVIS WITH CONTRAST TECHNIQUE: Multidetector CT imaging of the abdomen and pelvis was performed using the standard protocol following bolus administration of intravenous contrast. RADIATION DOSE REDUCTION: This exam was performed according to the departmental dose-optimization program which includes automated exposure control, adjustment of the mA and/or kV according to patient size and/or use of iterative reconstruction technique. CONTRAST:  OMNIPAQUE IOHEXOL 300 MG/ML  SOLN COMPARISON:  None Available. FINDINGS: Lower Chest: No acute findings. Hepatobiliary: A few tiny hepatic cysts are seen, however, there are multiple hypovascular masses involving the right and left hepatic lobes, largest in the central right hepatic lobe measuring 1.9 x 1.5 cm on image 26/2. These are consistent with liver metastases. Gallbladder is unremarkable. No evidence of biliary ductal dilatation. Pancreas:  No mass or inflammatory changes. Spleen: Within normal limits in size and appearance. Adrenals/Urinary Tract: No suspicious masses identified. No evidence of ureteral calculi or hydronephrosis. Stomach/Bowel: Distal small bowel obstruction, with transition point in the right lower quadrant, likely due to peritoneal carcinoma. Rectal intussusception incidentally noted. Vascular/Lymphatic: Mild retroperitoneal lymphadenopathy in the left para-aortic and  aortocaval spaces. No acute vascular findings. Reproductive: A 1.6 cm peripherally calcified fibroid is seen in the uterine fundus. Poorly defined masslike soft tissue prominence is seen in the region of the cervix and lower uterine segment measuring 3.6 x 3.3 cm on image 63/2, possibly representing a primary cervical or endometrial carcinoma. Poorly defined soft tissue density is seen in both adnexal regions and along the uterine fundus, consistent with peritoneal carcinomatosis. Other: Mild ascites is seen. Peritoneal and omental soft tissue nodules and masses are seen throughout the abdomen and pelvis, consistent with peritoneal carcinomatosis. Largest conglomerate mass is seen in the right abdomen measuring approximately 6.7 x 3.4 cm on image 37/2. Musculoskeletal:  No suspicious bone lesions identified. IMPRESSION: Diffuse peritoneal carcinomatosis and mild ascites. Distal small bowel obstruction, with transition point in the right lower quadrant, likely due to peritoneal carcinomatosis. Masslike soft tissue prominence in the region of the lower uterine segment and cervix, possibly representing primary cervical or endometrial carcinoma. Liver metastases. Mild retroperitoneal lymphadenopathy, consistent with metastatic disease. Rectal intussusception incidentally noted. No definite lead mass identified. Electronically Signed   By: Danae Orleans M.D.   On: 02/04/2024 11:34   Ms. Finerty was interviewed and examined.  She is now at day 4 following cycle 1 Taxol/carboplatin.  She continues G-CSF.  She has a persistent bowel obstruction. We have requested additional testing on the ascites cytology material.  If an adequate I will recommend a needle biopsy of the left axillary node.  Recommendations: Continue G-CSF, check CBC 02/16/2024 Management of bowel obstruction per the medical and surgical service, consider repeat imaging Follow-up results of molecular testing Continue heparin, transition to apixaban  when taking p.o.

## 2024-02-15 NOTE — Progress Notes (Signed)
 PHARMACY - ANTICOAGULATION CONSULT NOTE  Pharmacy Consult for heparin  Indication: acute pulmonary embolus and DVT  Allergies  Allergen Reactions   Calcium-Containing Compounds Other (See Comments)    Upsets the stomach, so it is not taken very often    Patient Measurements: Height: 5\' 4"  (162.6 cm) Weight: 53.9 kg (118 lb 14.4 oz) IBW/kg (Calculated) : 54.7 Heparin Dosing Weight: 54 kg  Vital Signs: Temp: 97.6 F (36.4 C) (02/17 2015) Temp Source: Oral (02/17 2015) BP: 121/69 (02/17 2015) Pulse Rate: 74 (02/17 2015)  Labs: Recent Labs    02/13/24 0557 02/13/24 1352 02/14/24 0200 02/15/24 0515  HGB 9.9*  --  9.9*  --   HCT 31.8*  --  30.6*  --   PLT 329  --  320  --   HEPARINUNFRC 0.68 0.78* 0.45 0.21*  CREATININE 0.73  --  0.75  --     Estimated Creatinine Clearance: 43 mL/min (by C-G formula based on SCr of 0.75 mg/dL).   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 Chest CT on 2/14 showed incidental acute PE in the "the distal right pulmonary artery and lobar and segmental right upper lobe and right middle lobe pulmonary artery branches" with borderline elevated RV/LV ratio.  LE doppler on 02/12/24 resulted back with positive acute DVT in the left peroneal veins, and left posterior tibial veins. She's currently on heparin drip for VTE treatment.  Significant events:  - 2/15: received carbo/paclitaxel   Today, 02/15/2024: -Heparin level = 0.21 sub therapeutic with heparin infusion at 750 units/hr -No bleeding complications noted  Goal of Therapy:  Heparin level 0.3-0.7 units/ml Monitor platelets by anticoagulation protocol: Yes   Plan:  -increase heparin drip to 850  units/hr - heparin level in 8 hours -Daily CBC & heparin level -Monitor for s/sx bleeding    Arley Phenix RPh 02/15/2024, 6:07 AM

## 2024-02-15 NOTE — Telephone Encounter (Signed)
 Patient Product/process development scientist completed.    The patient is insured through [employer]. Patient has Medicare and is not eligible for a copay card, but may be able to apply for patient assistance or Medicare RX Payment Plan (Patient Must reach out to their plan, if eligible for payment plan), if available.    Ran test claim for Eliquis 5 mg and the current 30 day co-pay is $30.00.  Ran test claim for Xarelto 20 mg and the current 30 day co-pay is $30.00.  This test claim was processed through [facility]- copay amounts may vary at other pharmacies due to pharmacy/plan contracts, or as the patient moves through the different stages of their insurance plan.     Roland Earl, CPHT Pharmacy Technician III Certified Patient Advocate [facility] Patient Advocate Team Direct Number: [phone]  Fax: [phone]

## 2024-02-15 NOTE — Progress Notes (Signed)
 PHARMACY - ANTICOAGULATION CONSULT NOTE  Pharmacy Consult for heparin  Indication: acute pulmonary embolus and DVT  Allergies  Allergen Reactions   Calcium-Containing Compounds Other (See Comments)    Upsets the stomach, so it is not taken very often    Patient Measurements: Height: 5\' 4"  (162.6 cm) Weight: 53.9 kg (118 lb 14.4 oz) IBW/kg (Calculated) : 54.7 Heparin Dosing Weight: total body weight   Vital Signs: Temp: 98.1 F (36.7 C) (02/18 1350) Temp Source: Oral (02/18 1350) BP: 111/61 (02/18 1350) Pulse Rate: 77 (02/18 1350)  Labs: Recent Labs    02/13/24 0557 02/13/24 1352 02/14/24 0200 02/15/24 0515 02/15/24 1500 02/15/24 1759  HGB 9.9*  --  9.9*  --   --   --   HCT 31.8*  --  30.6*  --   --   --   PLT 329  --  320  --   --   --   HEPARINUNFRC 0.68   < > 0.45 0.21* 0.20* 0.33  CREATININE 0.73  --  0.75 0.77  --   --    < > = values in this interval not displayed.    Estimated Creatinine Clearance: 43 mL/min (by C-G formula based on SCr of 0.77 mg/dL).   Medical History: Past Medical History:  Diagnosis Date   Cancer (HCC)    skin cancer leg -no problems now   GERD (gastroesophageal reflux disease)    mild -no meds   Heart murmur    "faint"   Hypertension     Assessment: 89 y/o F with metastatic ovarian cancer admitted with SBO secondary to peritoneal carcinomatosis. Chest CT on 2/14 showed incidental acute PE in the "the distal right pulmonary artery and lobar and segmental right upper lobe and right middle lobe pulmonary artery branches" with borderline elevated RV/LV ratio.  LE doppler on 02/12/24 positive for acute DVT in the left peroneal veins and left posterior tibial veins. Pharmacy consulted for IV heparin dosing for VTE treatment.  Significant events:  - 2/15: received carboplatin/paclitaxel   Today, 02/15/2024: -1500 heparin level = 0.2 units/mL, subtherapeutic and slightly decreased despite increase in heparin infusion rate to 850  units/hr earlier today  RN noted concern that level may not have been collected appropriately, so STAT repeat heparin level ordered (via peripheral stick) Repeat heparin level = 0.33 units/mL. Delay in level resulting due to QC being performed on machine in lab (per lab supervisor). Level is therapeutic.  -CBC: Hgb 9.9, low but stable. Plt WNL (2/17) -No bleeding issues noted per nursing -No IV site issues or interruptions in therapy per nursing  Goal of Therapy:  Heparin level 0.3-0.7 units/ml Monitor platelets by anticoagulation protocol: Yes   Plan:  -Continue IV heparin infusion at 850 units/hr -Heparin level in 8 hours to confirm remains in therapeutic range -Daily CBC, heparin level -Monitor closely for s/sx of bleeding    Greer Pickerel, PharmD, BCPS Clinical Pharmacist 02/15/2024, 4:47 PM

## 2024-02-15 NOTE — Progress Notes (Signed)
 PHARMACY - TOTAL PARENTERAL NUTRITION CONSULT NOTE   Indication: SBO  Assessment: [name] admitted 2/7 after 2 weeks of progressively worsening reflux and abdominal distention with n/v. 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 Plan for bowel rest +/- venting G-tube while patient undergoes chemotherapy to hopefully relieve obstruction. Pharmacy consulted for TPN management.  Glucose / Insulin: no hx DM; CBG goal 100-150 - Dexamethasone 8 mg daily as part of chemo regimen - CBGs remain very well controlled (96 - 163) despite scheduled steroids - 3 units SSI used yesterday Electrolytes: Na, bicarb borderline low but stable; all others stable WNL without repletion outside TPN Renal: SCr stable WNL (baseline < 1.0); BUN mildly elevated and rising; UOP not charted Hepatic: mild transaminitis noted 2/17 (previously WNL); stable today - Tbili stable WNL - TG WNL 2/17 I/O: NG output increased yesterday to almost 1L (had been < 500 ml) - no longer charting UOP - no mIVF GI Imaging: - 2/11 AXR: persistent but significantly improved SB dilation when compared with prior - 2/13 AXR: Worsening small bowel obstruction.  - 2/14 MRI pelvis: Abnormal amorphous soft tissue throughout lower uterine segment and cervix with ileocolic and likely ovarian mets. GI Surgeries / Procedures: NA  Central access: PICC TPN start date: 2/11  Nutritional Goals: Goal TPN rate is 70 mL/hr (provides 89 g of protein and 1717 kcals per day)  RD Assessment: Estimated Needs Total Energy Estimated Needs: 1625-1900 kcal Total Protein Estimated Needs: 70-90 g Total Fluid Estimated Needs: 78ml/kcal  Current Nutrition: NPO  Plan:  Continue TPN/Lipids at 70 mL/hr Electrolytes in TPN: no changes from yesterday Na - 80 mEq/L K - 40 mEq/L Ca - 3 mEq/L Mg - 5 mEq/L Phos - 15 mmol/L Cl:Ac ratio - 1:2 Add standard MVI and trace elements to TPN Continue to hold chromium for national backorder Given good CBG control on goal rate  TPN/steroids, will reduce CBGs to once daily in the afternoon (usually her highest reading of the day) mIVF per MD Monitor TPN labs on Mon/Thurs  Bernadene Person, PharmD, BCPS [phone] 02/15/2024, 8:50 AM

## 2024-02-15 NOTE — Progress Notes (Signed)
 Nutrition Follow-up  DOCUMENTATION CODES:   Non-severe (moderate) malnutrition in context of acute illness/injury  INTERVENTION:   -TPN management per Pharmacy -Daily weights while on TPN  -Will monitor for diet advancement and GOC  NUTRITION DIAGNOSIS:   Moderate Malnutrition related to acute illness as evidenced by severe fat depletion, moderate fat depletion.  Ongoing  GOAL:   Patient will meet greater than or equal to 90% of their needs  Meeting with TPN  MONITOR:   Diet advancement, Weight trends, Labs TPN  ASSESSMENT:   89 y.o. F, Presented to ED with complaints of abdominal distention and vomiting for the last 2 weeks. CT showed concerning findings and she was sent to ED for further evaluation. PMH; HTN, CKD.  2/7: admitted, NGT placed 2/8: s/p paracentesis, yield 100 ml 2/11: TPN initiation 2/15: chemotherapy  Patient continues TPN and is NPO. NGT still set to suction. Having some BMs.  Per MD note, pt with poor prognosis, may need venting G-tube.   TPN continuing at goal rate 70 ml/hr, providing 1716 kcals and 89g protein.   Admission weight: 125 lbs Last weight 2/14: 118 lbs   Medications reviewed.  Labs reviewed: CBGs: 96-163   Diet Order:   Diet Order             Diet NPO time specified Except for: Ice Chips  Diet effective now                   EDUCATION NEEDS:   Education needs have been addressed  Skin:  Skin Assessment: Reviewed RN Assessment  Last BM:  2/16  Height:   Ht Readings from Last 1 Encounters:  02/11/24 5\' 4"  (1.626 m)    Weight:   Wt Readings from Last 1 Encounters:  02/11/24 53.9 kg    BMI:  Body mass index is 20.41 kg/m.  Estimated Nutritional Needs:   Kcal:  1625-1900 kcal  Protein:  80-95g  Fluid:  1.9L/day   Tilda Franco, MS, RD, LDN Inpatient Clinical Dietitian Contact via Secure chat

## 2024-02-15 NOTE — Plan of Care (Signed)

## 2024-02-15 NOTE — Progress Notes (Signed)
 PROGRESS NOTE    Tracy Preston  QMV:[id number] DOB: 06-Jun-1935 DOA: 02/04/2024 PCP: Marden Noble, MD (Inactive)    Brief Narrative:  89 year old female with a history of hypertension, chronic kidney disease, family history of breast cancer who presents with abdominal distention and vomiting for approximately 2 weeks presenting to [facility] emergency department by her outpatient provider after CT scan of the abdomen is performed and is found to have findings concerning for new metastatic gynecological malignancy and small bowel obstruction.  Workup in the hospital showed concerning new onset peritoneal carcinomatosis likely GYN malignancy. General surgery and GYN oncology following.  Had pelvic MRI and CT chest.  Pelvic MRI clearly shows advanced GYN malignancy.  CT chest with contrast showed distal pulmonary embolism without cor pulmonale.  Started on heparin drip.  There is evidence of metastatic disease as previously demonstrated.  Received first cycle of chemo on 2/15, hoping her malignant bowel obstruction improves.  Currently on Decadron daily.  Down the road may need to consider octreotide and venting G-tube.   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  Oncology and GYN oncology has seen the patient. - s/p cycle 1 chemo 2/15 - MRI pelvis, CT chest-reviewed.  Showing pulmonary embolism. - Continue steroids for malignant bowel obstruction.  Down the road can consider octreotide and venting G-tube. -Family understands that overall she can  have poor prognosis and we may have to shift our focus towards comfort care depending on her tolerance to the treatment planned above.  Leukocytosis - Granix per oncology.  Closely monitor.  Right-sided distal pulmonary embolism without cor pulmonale Left lower extremity DVT - Currently on heparin drip.  Lower extremity Dopplers positive for DVT.  Eventually plans to transition to Eliquis - Echocardiogram-unremarkable with preserved EF  Poor nutritional status Severe protein calorie malnutrition - TPN  Essential hypertension -Losartan.  IV as needed  GERD (gastroesophageal reflux disease) PPI  Anemia -Anemia of chronic disease.   DVT prophylaxis: Heparin drip Patient is DNR/DNI Family Communication: Family is up to date  Status is: Inpatient Remains inpatient appropriate because: Continue hospital stay for SBO management    Subjective: No complaints and feeling well  Examination:  General exam: Appears calm and comfortable ; NGT in place.  Respiratory system: Clear to auscultation. Respiratory effort normal. Cardiovascular system: S1 & S2 heard, RRR. No JVD, murmurs, rubs, gallops or clicks. No pedal edema. Gastrointestinal system: Abdomen is nondistended, soft and nontender. No organomegaly or masses felt. Normal bowel sounds heard. Central nervous system: Alert and oriented. No focal neurological deficits. Extremities: Symmetric 5 x 5 power. Skin: No rashes, lesions or ulcers Psychiatry: Judgement and insight appear normal. Mood & affect appropriate.                Diet Orders (From admission, onward)     Start     Ordered   02/11/24 1525  Diet NPO time specified Except for: Ice Chips  Diet effective now       Question:  Except for  Answer:  Ice Chips   02/11/24 1524  Objective: Vitals:   02/14/24 1319 02/14/24 2015 02/15/24 0621 02/15/24 0934  BP: 129/72 121/69 (!) 115/55   Pulse: 73 74 71   Resp: 16 16 17    Temp: 97.6 F (36.4  C) 97.6 F (36.4 C) 97.9 F (36.6 C)   TempSrc: Oral Oral Oral   SpO2: 92% 92% 97% 93%  Weight:      Height:        Intake/Output Summary (Last 24 hours) at 02/15/2024 1214 Last data filed at 02/15/2024 0800 Gross per 24 hour  Intake 3052.42 ml  Output 400 ml  Net 2652.42 ml   Filed Weights   02/10/24 0437 02/11/24 0632 02/11/24 0900  Weight: 56.7 kg 53.9 kg 53.9 kg    Scheduled Meds:  Chlorhexidine Gluconate Cloth  6 each Topical Daily   dexamethasone (DECADRON) injection  8 mg Intravenous Daily   insulin aspart  0-9 Units Subcutaneous q1600   losartan  100 mg Per Tube q morning   pantoprazole (PROTONIX) IV  40 mg Intravenous Q12H   sodium chloride flush  10-40 mL Intracatheter Q12H   Tbo-Filgrastim  300 mcg Subcutaneous q1800   Continuous Infusions:  heparin 850 Units/hr (02/15/24 0640)   TPN ADULT (ION) 70 mL/hr at 02/15/24 0640   TPN ADULT (ION)      Nutritional status Signs/Symptoms: severe fat depletion, moderate fat depletion Interventions: TPN Body mass index is 20.41 kg/m.  Data Reviewed:   CBC: Recent Labs  Lab 02/10/24 0345 02/11/24 0337 02/12/24 0230 02/13/24 0557 02/14/24 0200  WBC 8.3 9.6 11.2* 10.3 35.7*  HGB 10.7* 10.7* 10.1* 9.9* 9.9*  HCT 32.6* 33.5* 31.7* 31.8* 30.6*  MCV 96.2 98.0 96.6 98.5 97.5  PLT 297 304 318 329 320   Basic Metabolic Panel: Recent Labs  Lab 02/09/24 0419 02/10/24 0345 02/11/24 0337 02/12/24 0230 02/13/24 0557 02/14/24 0200 02/15/24 0515  NA 141 140 141 139 139 135 136  K 3.8 3.9 4.2 4.0 4.4 4.4 4.3  CL 107 109 111 110 110 107 108  CO2 26 25 22 22 23  20* 23  GLUCOSE 118* 124* 130* 105* 115* 119* 98  BUN 21 24* 28* 27* 31* 33* 34*  CREATININE 0.92 0.87 0.84 0.73 0.73 0.75 0.77  CALCIUM 8.3* 8.2* 8.7* 8.2* 8.0* 7.9* 8.0*  MG 2.0 1.9 2.0 2.0 2.2 2.4 2.2  PHOS 2.3* 3.3  --   --   --  2.8 2.8   GFR: Estimated Creatinine Clearance: 43 mL/min (by C-G formula based on SCr of 0.77 mg/dL). Liver Function  Tests: Recent Labs  Lab 02/09/24 0419 02/10/24 0345 02/14/24 0200 02/15/24 0515  AST 28 31 68* 59*  ALT 20 24 64* 67*  ALKPHOS 75 90 143* 140*  BILITOT 0.4 0.3 0.4 <0.2  PROT 5.6* 5.6* 5.5* 5.4*  ALBUMIN 2.5* 2.2* 2.4* 2.3*   No results for input(s): "LIPASE", "AMYLASE" in the last 168 hours. No results for input(s): "AMMONIA" in the last 168 hours. Coagulation Profile: No results for input(s): "INR", "PROTIME" in the last 168 hours. Cardiac Enzymes: No results for input(s): "CKTOTAL", "CKMB", "CKMBINDEX", "TROPONINI" in the last 168 hours. BNP (last 3 results) No results for input(s): "PROBNP" in the last 8760 hours. HbA1C: No results for input(s): "HGBA1C" in the last 72 hours. CBG: Recent Labs  Lab 02/13/24 2356 02/14/24 0750 02/14/24 1633 02/15/24 0057 02/15/24 0749  GLUCAP 132* 96 163* 99 111*   Lipid Profile: Recent Labs    02/14/24 1010  TRIG 134   Thyroid  Function Tests: No results for input(s): "TSH", "T4TOTAL", "FREET4", "T3FREE", "THYROIDAB" in the last 72 hours. Anemia Panel: No results for input(s): "VITAMINB12", "FOLATE", "FERRITIN", "TIBC", "IRON", "RETICCTPCT" in the last 72 hours. Sepsis Labs: No results for input(s): "PROCALCITON", "LATICACIDVEN" in the last 168 hours.  Recent Results (from the past 240 hours)  Body fluid culture w Gram Stain     Status: None   Collection Time: 02/05/24  2:14 PM   Specimen: PATH Cytology Pleural fluid  Result Value Ref Range Status   Specimen Description   Final    PLEURAL Performed at [facility], [address], [place], [place] [address]    Special Requests   Final    NONE Performed at [facility], [address], [place], [place] [address]    Gram Stain WBC SEEN NO ORGANISMS SEEN CYTOSPIN SMEAR   Final   Culture   Final    NO GROWTH 3 DAYS Performed at [facility], [address], [place], [place] [address]    Report Status 02/09/2024 FINAL  Final          Radiology Studies: ECHOCARDIOGRAM COMPLETE Result Date: 02/13/2024    ECHOCARDIOGRAM REPORT   Patient Name:   Tracy Preston Date of Exam: 02/13/2024 Medical Rec #:  [id number]          Height:       64.0 in Accession #:    [id number]         Weight:       118.9 lb Date of Birth:  05/29/1935          BSA:          1.568 m Patient Age:    88 years           BP:           142/72 mmHg Patient Gender: F                  HR:           71 bpm. Exam Location:  Inpatient Procedure: 2D Echo, Cardiac Doppler and Color Doppler (Both Spectral and Color            Flow Doppler were utilized during procedure). Indications:    Cardiomyopathy- Unspecified I42.9  History:        Patient has no prior history of Echocardiogram examinations.                 Risk Factors:Hypertension.  Sonographer:    Lucendia Herrlich RCS Referring Phys: [id number] Doreen Salvage Mance Vallejo IMPRESSIONS  1. Left ventricular ejection fraction, by estimation, is 65 to 70%. The left ventricle has normal function. The left ventricle has no regional wall motion abnormalities. Left ventricular diastolic parameters were normal.  2. Right ventricular systolic function is normal. The right ventricular size is normal. There is normal pulmonary artery systolic pressure. The estimated right ventricular systolic pressure is 23.8 mmHg.  3. The mitral valve is grossly normal. No evidence of mitral valve regurgitation. No evidence of mitral stenosis.  4. The aortic valve is calcified. Aortic valve regurgitation is trivial. Aortic valve sclerosis/calcification is present, without any evidence of aortic stenosis.  5. The inferior vena cava is normal in size with greater than 50% respiratory variability, suggesting right atrial pressure of 3 mmHg. FINDINGS  Left Ventricle: Left ventricular ejection fraction, by estimation, is 65 to 70%. The left ventricle has normal function. The left ventricle has no regional  wall motion abnormalities. Strain imaging was not performed.  The left ventricular internal cavity  size was normal in size. There is no left ventricular hypertrophy. Left ventricular diastolic parameters were normal. Right Ventricle: The right ventricular size is normal. No increase in right ventricular wall thickness. Right ventricular systolic function is normal. There is normal pulmonary artery systolic pressure. The tricuspid regurgitant velocity is 2.28 m/s, and  with an assumed right atrial pressure of 3 mmHg, the estimated right ventricular systolic pressure is 23.8 mmHg. Left Atrium: Left atrial size was normal in size. Right Atrium: Right atrial size was normal in size. Pericardium: There is no evidence of pericardial effusion. Mitral Valve: The mitral valve is grossly normal. No evidence of mitral valve regurgitation. No evidence of mitral valve stenosis. Tricuspid Valve: The tricuspid valve is grossly normal. Tricuspid valve regurgitation is trivial. No evidence of tricuspid stenosis. Aortic Valve: The aortic valve is calcified. Aortic valve regurgitation is trivial. Aortic regurgitation PHT measures 299 msec. Aortic valve sclerosis/calcification is present, without any evidence of aortic stenosis. Aortic valve mean gradient measures 9.0 mmHg. Aortic valve peak gradient measures 11.1 mmHg. Aortic valve area, by VTI measures 1.23 cm. Pulmonic Valve: The pulmonic valve was grossly normal. Pulmonic valve regurgitation is trivial. No evidence of pulmonic stenosis. Aorta: The aortic root and ascending aorta are structurally normal, with no evidence of dilitation. Venous: The inferior vena cava is normal in size with greater than 50% respiratory variability, suggesting right atrial pressure of 3 mmHg. IAS/Shunts: The atrial septum is grossly normal. Additional Comments: 3D imaging was not performed. Mild ascites is present.  LEFT VENTRICLE PLAX 2D LVIDd:         4.00 cm   Diastology LVIDs:         2.60 cm   LV e' medial:    9.25 cm/s LV PW:         0.90 cm   LV E/e'  medial:  6.4 LV IVS:        1.00 cm   LV e' lateral:   12.20 cm/s LVOT diam:     1.80 cm   LV E/e' lateral: 4.9 LV SV:         48 LV SV Index:   31 LVOT Area:     2.54 cm  RIGHT VENTRICLE             IVC RV S prime:     11.00 cm/s  IVC diam: 1.20 cm TAPSE (M-mode): 1.8 cm LEFT ATRIUM             Index        RIGHT ATRIUM           Index LA diam:        3.70 cm 2.36 cm/m   RA Area:     12.50 cm LA Vol (A2C):   29.1 ml 18.55 ml/m  RA Volume:   29.10 ml  18.55 ml/m LA Vol (A4C):   40.0 ml 25.50 ml/m LA Biplane Vol: 34.3 ml 21.87 ml/m  AORTIC VALVE                     PULMONIC VALVE AV Area (Vmax):    1.61 cm      PR End Diast Vel: 2.12 msec AV Area (Vmean):   1.25 cm AV Area (VTI):     1.23 cm AV Vmax:           166.67 cm/s AV Vmean:  138.000 cm/s AV VTI:            0.392 m AV Peak Grad:      11.1 mmHg AV Mean Grad:      9.0 mmHg LVOT Vmax:         105.50 cm/s LVOT Vmean:        67.950 cm/s LVOT VTI:          0.190 m LVOT/AV VTI ratio: 0.48 AI PHT:            299 msec  AORTA Ao Root diam: 3.10 cm Ao Asc diam:  2.80 cm MITRAL VALVE               TRICUSPID VALVE MV Area (PHT): 3.27 cm    TR Peak grad:   20.8 mmHg MV Decel Time: 232 msec    TR Vmax:        228.00 cm/s MV E velocity: 59.60 cm/s MV A velocity: 76.60 cm/s  SHUNTS MV E/A ratio:  0.78        Systemic VTI:  0.19 m                            Systemic Diam: 1.80 cm Lennie Odor MD Electronically signed by Lennie Odor MD Signature Date/Time: 02/13/2024/8:53:15 PM    Final            LOS: 11 days   Time spent= 35 mins    Miguel Rota, MD [facility]  If 7PM-7AM, please contact night-coverage  02/15/2024, 12:14 PM

## 2024-02-16 DIAGNOSIS — C786 Secondary malignant neoplasm of retroperitoneum and peritoneum: Secondary | ICD-10-CM | POA: Diagnosis not present

## 2024-02-16 DIAGNOSIS — Z515 Encounter for palliative care: Secondary | ICD-10-CM | POA: Diagnosis not present

## 2024-02-16 DIAGNOSIS — Z7189 Other specified counseling: Secondary | ICD-10-CM | POA: Diagnosis not present

## 2024-02-16 LAB — CBC WITH DIFFERENTIAL/PLATELET
Abs Immature Granulocytes: 5.4 10*3/uL — ABNORMAL HIGH (ref 0.00–0.07)
Basophils Absolute: 0.1 10*3/uL (ref 0.0–0.1)
Basophils Relative: 0 %
Eosinophils Absolute: 0.2 10*3/uL (ref 0.0–0.5)
Eosinophils Relative: 1 %
HCT: 29.5 % — ABNORMAL LOW (ref 36.0–46.0)
Hemoglobin: 9.2 g/dL — ABNORMAL LOW (ref 12.0–15.0)
Immature Granulocytes: 18 %
Lymphocytes Relative: 5 %
Lymphs Abs: 1.5 10*3/uL (ref 0.7–4.0)
MCH: 30.9 pg (ref 26.0–34.0)
MCHC: 31.2 g/dL (ref 30.0–36.0)
MCV: 99 fL (ref 80.0–100.0)
Monocytes Absolute: 0.5 10*3/uL (ref 0.1–1.0)
Monocytes Relative: 2 %
Neutro Abs: 22.3 10*3/uL — ABNORMAL HIGH (ref 1.7–7.7)
Neutrophils Relative %: 74 %
Platelets: 253 10*3/uL (ref 150–400)
RBC: 2.98 MIL/uL — ABNORMAL LOW (ref 3.87–5.11)
RDW: 13.1 % (ref 11.5–15.5)
WBC: 29.9 10*3/uL — ABNORMAL HIGH (ref 4.0–10.5)
nRBC: 0 % (ref 0.0–0.2)

## 2024-02-16 LAB — GLUCOSE, CAPILLARY
Glucose-Capillary: 109 mg/dL — ABNORMAL HIGH (ref 70–99)
Glucose-Capillary: 135 mg/dL — ABNORMAL HIGH (ref 70–99)

## 2024-02-16 LAB — HEPARIN LEVEL (UNFRACTIONATED)
Heparin Unfractionated: 0.23 [IU]/mL — ABNORMAL LOW (ref 0.30–0.70)
Heparin Unfractionated: 0.21 [IU]/mL — ABNORMAL LOW (ref 0.30–0.70)

## 2024-02-16 LAB — CYTOLOGY - NON PAP

## 2024-02-16 MED ORDER — TRAVASOL 10 % IV SOLN
INTRAVENOUS | Status: AC
  Filled 2024-02-16: qty 890.4

## 2024-02-16 NOTE — Progress Notes (Signed)
 PHARMACY - ANTICOAGULATION CONSULT NOTE  Pharmacy Consult for heparin  Indication: acute pulmonary embolus and DVT  Allergies  Allergen Reactions   Calcium-Containing Compounds Other (See Comments)    Upsets the stomach, so it is not taken very often    Patient Measurements: Height: 5\' 4"  (162.6 cm) Weight: 53.9 kg (118 lb 14.4 oz) IBW/kg (Calculated) : 54.7 Heparin Dosing Weight: total body weight   Vital Signs: Temp: 98.2 F (36.8 C) (02/18 2147) Temp Source: Oral (02/18 2147) BP: 114/56 (02/18 2147) Pulse Rate: 71 (02/18 2147)  Labs: Recent Labs    02/13/24 0557 02/13/24 1352 02/14/24 0200 02/15/24 0515 02/15/24 1500 02/15/24 1759 02/16/24 0138  HGB 9.9*  --  9.9*  --   --   --  9.2*  HCT 31.8*  --  30.6*  --   --   --  29.5*  PLT 329  --  320  --   --   --  253  HEPARINUNFRC 0.68   < > 0.45 0.21* 0.20* 0.33 0.23*  CREATININE 0.73  --  0.75 0.77  --   --   --    < > = values in this interval not displayed.    Estimated Creatinine Clearance: 43 mL/min (by C-G formula based on SCr of 0.77 mg/dL).   Medical History: Past Medical History:  Diagnosis Date   Cancer (HCC)    skin cancer leg -no problems now   GERD (gastroesophageal reflux disease)    mild -no meds   Heart murmur    "faint"   Hypertension     Assessment: 89 y/o F with metastatic ovarian cancer admitted with SBO secondary to peritoneal carcinomatosis. Chest CT on 2/14 showed incidental acute PE in the "the distal right pulmonary artery and lobar and segmental right upper lobe and right middle lobe pulmonary artery branches" with borderline elevated RV/LV ratio.  LE doppler on 02/12/24 positive for acute DVT in the left peroneal veins and left posterior tibial veins. Pharmacy consulted for IV heparin dosing for VTE treatment.  Significant events:  - 2/15: received carboplatin/paclitaxel   Today, 02/16/2024: HL 0.23 subtherapeutic on 850 units/hr Hgb low but stable, plts WNL No bleeding or  interruptions noted  Goal of Therapy:  Heparin level 0.3-0.7 units/ml Monitor platelets by anticoagulation protocol: Yes   Plan:  -increase heparin drip to 950 units/hr -Heparin level in 8 hours  -Daily CBC, heparin level -Monitor closely for s/sx of bleeding    Arley Phenix RPh 02/16/2024, 4:13 AM

## 2024-02-16 NOTE — Progress Notes (Addendum)
 Tracy Preston   DOB:[birth year]   RU#:[id number]      ASSESSMENT & PLAN:  1.  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 - Newly diagnosed February 2025 - CT scan 02/04/2024 shows diffuse peritoneal carcinomatosis and mild ascites. - Pelvic ultrasound 02/05/2024 shows 2 cm heterogeneous area at the uterine fundus, endometrial malignancy not excluded. - S/p paracentesis 02/05/2024, 100 mL of yellow fluid removed. 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 -MRI pelvis and CT chest for staging done on 02/11/2024.  Shows abnormal soft tissue throughout the lower uterine and cervix could represent primary uterine/cervical carcinoma or mets. - Chemotherapy with carboplatin and Taxol initiated on 02/12/2024.  Tolerated well. - On Granix 300 mcg daily x 5, started on 02/13/2024- day 4 of 5.  WBC remains somewhat elevated, likely due to Granix.  However will continue Granix. - GYN Onc following closely.  Consideration for starting octreotide. - Palliative team also following - Medical oncology/Dr. Truett Perna following closely.   2.  Small bowel obstruction - Remains with persistent SBO  - Distal SBO with transition point in the right lower quadrant  - Likely due to peritoneal carcinomatosis - NGT remains intact to wall suction - TPN infusing well - Management per surgery   3.  Acute pulmonary embolism - Per CT of the chest for staging purposes done on 02/11/2024.  This showed incidental acute PE involving the distal right pulmonary artery and lobar and segmental RUL and RML pulmonary artery branches.  Small right pleural effusion. - On IV heparin, continue per protocol - Monitor closely for active bleeding   4.  Anemia, normocytic - HGB stable 9.2 with MCV 99.0 today.   - Transfuse PRBC for hemoglobin <7.0.  No transfusional intervention warranted  at this time. - Please draw next CBC with differential on 02/18/2024    5.  Hypertension - On losartan at home - BP stable - Monitor BP counts - Primary team following    Code Status DNR-Limited  Subjective:  Patient seen awake and alert talking on her phone and laying in bed.  NG tube remains intact to wall suction and draining well.  IV heparin infusion ongoing with no active bleeding noted.  She is very pleasant and reports that she feels well, still with abdominal distention that she wishes would go away.  Reports 3 watery bowel movements overnight, also flatus.  No acute distress is noted.  Objective:  Vitals:   02/15/24 2147 02/16/24 0416  BP: (!) 114/56 133/71  Pulse: 71 79  Resp: 18 18  Temp: 98.2 F (36.8 C) (!) 97.5 F (36.4 C)  SpO2: 93% 93%     Intake/Output Summary (Last 24 hours) at 02/16/2024 9147 Last data filed at 02/16/2024 0341 Gross per 24 hour  Intake 1591.97 ml  Output 800 ml  Net 791.97 ml     REVIEW OF SYSTEMS:   Constitutional: + Fatigue, denies fevers, chills or abnormal night sweats Eyes: Denies blurriness of vision, double vision or watery eyes Ears, nose, mouth, throat, and face: Denies mucositis or sore throat Respiratory: Denies cough, dyspnea or wheezes Cardiovascular: Denies palpitation, chest discomfort or lower extremity swelling Gastrointestinal: +Abdominal distention Skin: Denies abnormal skin rashes Lymphatics: Denies new lymphadenopathy or easy bruising Neurological: Denies numbness, tingling or new weaknesses Behavioral/Psych: Mood is stable, no new changes  All other systems were reviewed with the patient and are negative.  PHYSICAL  EXAMINATION: ECOG PERFORMANCE STATUS: 2 - Symptomatic, <50% confined to bed  Vitals:   02/15/24 2147 02/16/24 0416  BP: (!) 114/56 133/71  Pulse: 71 79  Resp: 18 18  Temp: 98.2 F (36.8 C) (!) 97.5 F (36.4 C)  SpO2: 93% 93%   Filed Weights   02/10/24 0437 02/11/24 9147 02/11/24 0900   Weight: 125 lb (56.7 kg) 118 lb 14.4 oz (53.9 kg) 118 lb 14.4 oz (53.9 kg)    GENERAL: alert, +NGT intact SKIN: + Pale skin color, texture, turgor are normal, no rashes or significant lesions EYES: normal, conjunctiva are pink and non-injected, sclera clear OROPHARYNX: no exudate, no erythema and lips, buccal mucosa, and tongue normal  NECK: supple, thyroid normal size, non-tender, without nodularity LYMPH: no palpable lymphadenopathy in the cervical, axillary or inguinal LUNGS: clear to auscultation and percussion with normal breathing effort HEART: regular rate & rhythm and no murmurs and no lower extremity edema ABDOMEN: + Abdominal distention, non-tender and normal bowel sounds MUSCULOSKELETAL: no cyanosis of digits and no clubbing  PSYCH: alert & oriented x 3 with fluent speech NEURO: no focal motor/sensory deficits   All questions were answered. The patient knows to call the clinic with any problems, questions or concerns.   The total time spent in the appointment was 40 minutes encounter with patient including review of chart and various tests results, discussions about plan of care and coordination of care plan  Dawson Bills, NP 02/16/2024 8:38 AM    Labs Reviewed:  Lab Results  Component Value Date   WBC 29.9 (H) 02/16/2024   HGB 9.2 (L) 02/16/2024   HCT 29.5 (L) 02/16/2024   MCV 99.0 02/16/2024   PLT 253 02/16/2024   Recent Labs    02/10/24 0345 02/11/24 0337 02/13/24 0557 02/14/24 0200 02/15/24 0515  NA 140   < > 139 135 136  K 3.9   < > 4.4 4.4 4.3  CL 109   < > 110 107 108  CO2 25   < > 23 20* 23  GLUCOSE 124*   < > 115* 119* 98  BUN 24*   < > 31* 33* 34*  CREATININE 0.87   < > 0.73 0.75 0.77  CALCIUM 8.2*   < > 8.0* 7.9* 8.0*  GFRNONAA >60   < > >60 >60 >60  PROT 5.6*  --   --  5.5* 5.4*  ALBUMIN 2.2*  --   --  2.4* 2.3*  AST 31  --   --  68* 59*  ALT 24  --   --  64* 67*  ALKPHOS 90  --   --  143* 140*  BILITOT 0.3  --   --  0.4 <0.2   < > =  values in this interval not displayed.    Studies Reviewed:  ECHOCARDIOGRAM COMPLETE Result Date: 02/13/2024    ECHOCARDIOGRAM REPORT   Patient Name:   AGAM TUOHY Date of Exam: 02/13/2024 Medical Rec #:  [id number]          Height:       64.0 in Accession #:    [id number]         Weight:       118.9 lb Date of Birth:  October 11, 1935          BSA:          1.568 m Patient Age:    88 years           BP:  142/72 mmHg Patient Gender: F                  HR:           71 bpm. Exam Location:  Inpatient Procedure: 2D Echo, Cardiac Doppler and Color Doppler (Both Spectral and Color            Flow Doppler were utilized during procedure). Indications:    Cardiomyopathy- Unspecified I42.9  History:        Patient has no prior history of Echocardiogram examinations.                 Risk Factors:Hypertension.  Sonographer:    Lucendia Herrlich RCS Referring Phys: [id number] Doreen Salvage AMIN IMPRESSIONS  1. Left ventricular ejection fraction, by estimation, is 65 to 70%. The left ventricle has normal function. The left ventricle has no regional wall motion abnormalities. Left ventricular diastolic parameters were normal.  2. Right ventricular systolic function is normal. The right ventricular size is normal. There is normal pulmonary artery systolic pressure. The estimated right ventricular systolic pressure is 23.8 mmHg.  3. The mitral valve is grossly normal. No evidence of mitral valve regurgitation. No evidence of mitral stenosis.  4. The aortic valve is calcified. Aortic valve regurgitation is trivial. Aortic valve sclerosis/calcification is present, without any evidence of aortic stenosis.  5. The inferior vena cava is normal in size with greater than 50% respiratory variability, suggesting right atrial pressure of 3 mmHg. FINDINGS  Left Ventricle: Left ventricular ejection fraction, by estimation, is 65 to 70%. The left ventricle has normal function. The left ventricle has no regional wall motion abnormalities.  Strain imaging was not performed. The left ventricular internal cavity  size was normal in size. There is no left ventricular hypertrophy. Left ventricular diastolic parameters were normal. Right Ventricle: The right ventricular size is normal. No increase in right ventricular wall thickness. Right ventricular systolic function is normal. There is normal pulmonary artery systolic pressure. The tricuspid regurgitant velocity is 2.28 m/s, and  with an assumed right atrial pressure of 3 mmHg, the estimated right ventricular systolic pressure is 23.8 mmHg. Left Atrium: Left atrial size was normal in size. Right Atrium: Right atrial size was normal in size. Pericardium: There is no evidence of pericardial effusion. Mitral Valve: The mitral valve is grossly normal. No evidence of mitral valve regurgitation. No evidence of mitral valve stenosis. Tricuspid Valve: The tricuspid valve is grossly normal. Tricuspid valve regurgitation is trivial. No evidence of tricuspid stenosis. Aortic Valve: The aortic valve is calcified. Aortic valve regurgitation is trivial. Aortic regurgitation PHT measures 299 msec. Aortic valve sclerosis/calcification is present, without any evidence of aortic stenosis. Aortic valve mean gradient measures 9.0 mmHg. Aortic valve peak gradient measures 11.1 mmHg. Aortic valve area, by VTI measures 1.23 cm. Pulmonic Valve: The pulmonic valve was grossly normal. Pulmonic valve regurgitation is trivial. No evidence of pulmonic stenosis. Aorta: The aortic root and ascending aorta are structurally normal, with no evidence of dilitation. Venous: The inferior vena cava is normal in size with greater than 50% respiratory variability, suggesting right atrial pressure of 3 mmHg. IAS/Shunts: The atrial septum is grossly normal. Additional Comments: 3D imaging was not performed. Mild ascites is present.  LEFT VENTRICLE PLAX 2D LVIDd:         4.00 cm   Diastology LVIDs:         2.60 cm   LV e' medial:    9.25 cm/s LV  PW:  0.90 cm   LV E/e' medial:  6.4 LV IVS:        1.00 cm   LV e' lateral:   12.20 cm/s LVOT diam:     1.80 cm   LV E/e' lateral: 4.9 LV SV:         48 LV SV Index:   31 LVOT Area:     2.54 cm  RIGHT VENTRICLE             IVC RV S prime:     11.00 cm/s  IVC diam: 1.20 cm TAPSE (M-mode): 1.8 cm LEFT ATRIUM             Index        RIGHT ATRIUM           Index LA diam:        3.70 cm 2.36 cm/m   RA Area:     12.50 cm LA Vol (A2C):   29.1 ml 18.55 ml/m  RA Volume:   29.10 ml  18.55 ml/m LA Vol (A4C):   40.0 ml 25.50 ml/m LA Biplane Vol: 34.3 ml 21.87 ml/m  AORTIC VALVE                     PULMONIC VALVE AV Area (Vmax):    1.61 cm      PR End Diast Vel: 2.12 msec AV Area (Vmean):   1.25 cm AV Area (VTI):     1.23 cm AV Vmax:           166.67 cm/s AV Vmean:          138.000 cm/s AV VTI:            0.392 m AV Peak Grad:      11.1 mmHg AV Mean Grad:      9.0 mmHg LVOT Vmax:         105.50 cm/s LVOT Vmean:        67.950 cm/s LVOT VTI:          0.190 m LVOT/AV VTI ratio: 0.48 AI PHT:            299 msec  AORTA Ao Root diam: 3.10 cm Ao Asc diam:  2.80 cm MITRAL VALVE               TRICUSPID VALVE MV Area (PHT): 3.27 cm    TR Peak grad:   20.8 mmHg MV Decel Time: 232 msec    TR Vmax:        228.00 cm/s MV E velocity: 59.60 cm/s MV A velocity: 76.60 cm/s  SHUNTS MV E/A ratio:  0.78        Systemic VTI:  0.19 m                            Systemic Diam: 1.80 cm Lennie Odor MD Electronically signed by Lennie Odor MD Signature Date/Time: 02/13/2024/8:53:15 PM    Final    VAS [place] LOWER EXTREMITY VENOUS (DVT) Result Date: 02/12/2024  Lower Venous DVT Study Patient Name:  LYNDY RUSSMAN  Date of Exam:   02/12/2024 Medical Rec #: [id number]           Accession #:    [id number] Date of Birth: 09/25/1935           Patient Gender: F Patient Age:   34 years Exam Location:  [facility] Procedure:      VAS  [place] LOWER EXTREMITY VENOUS (DVT) Referring Phys: Stephania Fragmin  --------------------------------------------------------------------------------  Indications: Pulmonary embolism.  Risk Factors: Newly diagnosed peritoneal carcinomatosis. Comparison Study: No previous exams Performing Technologist: Jody Hill RVT, RDMS  Examination Guidelines: A complete evaluation includes B-mode imaging, spectral Doppler, color Doppler, and power Doppler as needed of all accessible portions of each vessel. Bilateral testing is considered an integral part of a complete examination. Limited examinations for reoccurring indications may be performed as noted. The reflux portion of the exam is performed with the patient in reverse Trendelenburg.  +---------+---------------+---------+-----------+----------+--------------+ RIGHT    CompressibilityPhasicitySpontaneityPropertiesThrombus Aging +---------+---------------+---------+-----------+----------+--------------+ CFV      Full           Yes      No                                  +---------+---------------+---------+-----------+----------+--------------+ SFJ      Full                                                        +---------+---------------+---------+-----------+----------+--------------+ FV Prox  Full           Yes      Yes                                 +---------+---------------+---------+-----------+----------+--------------+ FV Mid   Full           Yes      Yes                                 +---------+---------------+---------+-----------+----------+--------------+ FV DistalFull           Yes      Yes                                 +---------+---------------+---------+-----------+----------+--------------+ PFV      Full                                                        +---------+---------------+---------+-----------+----------+--------------+ POP      Full           Yes      Yes                                  +---------+---------------+---------+-----------+----------+--------------+ PTV      Full                                                        +---------+---------------+---------+-----------+----------+--------------+ PERO     Full                                                        +---------+---------------+---------+-----------+----------+--------------+   +---------+---------------+---------+-----------+----------+--------------+  LEFT     CompressibilityPhasicitySpontaneityPropertiesThrombus Aging +---------+---------------+---------+-----------+----------+--------------+ CFV      Full           Yes      Yes                                 +---------+---------------+---------+-----------+----------+--------------+ SFJ      Full                                                        +---------+---------------+---------+-----------+----------+--------------+ FV Prox  Full           Yes      Yes                                 +---------+---------------+---------+-----------+----------+--------------+ FV Mid   Full           Yes      Yes                                 +---------+---------------+---------+-----------+----------+--------------+ FV DistalFull           Yes      Yes                                 +---------+---------------+---------+-----------+----------+--------------+ PFV      Full                                                        +---------+---------------+---------+-----------+----------+--------------+ POP      Full           Yes      Yes                                 +---------+---------------+---------+-----------+----------+--------------+ PTV      None           No       No                   Acute          +---------+---------------+---------+-----------+----------+--------------+ PERO     None           No       No                   Acute           +---------+---------------+---------+-----------+----------+--------------+     Summary: BILATERAL: -No evidence of popliteal cyst, bilaterally. RIGHT: - There is no evidence of deep vein thrombosis in the lower extremity.  LEFT: - Findings consistent with acute deep vein thrombosis involving the left peroneal veins, and left posterior tibial veins.   *See table(s) above for measurements and observations. Electronically signed by Carolynn Sayers on 02/12/2024 at 8:18:12 PM.    Final    CT CHEST W CONTRAST Addendum Date: 02/11/2024 ADDENDUM REPORT: 02/11/2024 15:54  ADDENDUM: Critical Value/emergent results were called by telephone at the time of interpretation on 02/11/2024 at 3:54 pm to provider Eugene Garnet, MD, who verbally acknowledged these results. Electronically Signed   By: Delbert Phenix M.D.   On: 02/11/2024 15:54   Result Date: 02/11/2024 CLINICAL DATA:  Peritoneal carcinomatosis. Chest staging. * Tracking Code: BO * EXAM: CT CHEST WITH CONTRAST TECHNIQUE: Multidetector CT imaging of the chest was performed during intravenous contrast administration. RADIATION DOSE REDUCTION: This exam was performed according to the departmental dose-optimization program which includes automated exposure control, adjustment of the mA and/or kV according to patient size and/or use of iterative reconstruction technique. CONTRAST:  75mL OMNIPAQUE IOHEXOL 300 MG/ML  SOLN COMPARISON:  02/04/2024 CT abdomen/pelvis. 02/08/2024 chest radiograph. FINDINGS: Cardiovascular: Normal heart size. No significant pericardial effusion/thickening. Left anterior descending coronary atherosclerosis. Right PICC terminates in the middle third of the SVC. Atherosclerotic nonaneurysmal thoracic aorta. Incidental acute pulmonary embolism involving the distal right pulmonary artery and lobar and segmental right upper lobe and right middle lobe pulmonary artery branches. No saddle embolus. Normal caliber main pulmonary artery. RV/LV ratio 1.0.  Mediastinum/Nodes: No significant thyroid nodules. Fluid level in the mid to lower thoracic esophagus with enteric tube terminating in the proximal stomach. No right axillary adenopathy. Mild left axillary adenopathy up to 1.1 cm (series 2/image 61). No pathologically enlarged mediastinal or hilar nodes. Lungs/Pleura: No pneumothorax. Small layering right pleural effusion. No left pleural effusion. Calcified 1 cm posterior right lower lobe granuloma. Mild dependent bilateral lower lobe atelectasis. No acute consolidative airspace disease, lung masses or significant pulmonary nodules. Upper abdomen: Moderate upper abdominal ascites with nodular soft tissue caking in the visualized anterior left upper peritoneum, as seen on recent CT abdomen study. Several hypodense liver lesions scattered throughout the visualized liver, unchanged, largest 1.6 cm in the inferior liver on series 2/image 155. Musculoskeletal: No aggressive appearing focal osseous lesions. Mild thoracic spondylosis. IMPRESSION: 1. Incidental acute pulmonary embolism involving the distal right pulmonary artery and lobar and segmental right upper lobe and right middle lobe pulmonary artery branches. No saddle embolus. Normal caliber main pulmonary artery. Borderline elevated RV/LV Ratio = 1.0. 2. Small layering right pleural effusion. Mild dependent bilateral lower lobe atelectasis. 3. Mild left axillary adenopathy, cannot exclude metastatic disease. No other evidence of metastatic disease in the chest. 4. One vessel coronary atherosclerosis. 5. Moderate upper abdominal ascites with nodular soft tissue caking in the visualized anterior left upper peritoneum, as seen on recent CT abdomen study, compatible with peritoneal carcinomatosis. Several hypodense liver lesions scattered throughout the visualized liver, unchanged, suspicious for liver metastases. Electronically Signed: By: Delbert Phenix M.D. On: 02/11/2024 15:28   MR PELVIS W WO CONTRAST Result  Date: 02/11/2024 CLINICAL DATA:  "Occult malignancy". Peritoneal carcinomatosis on abdominopelvic CT with bowel obstruction. EXAM: MRI PELVIS WITHOUT AND WITH CONTRAST TECHNIQUE: Multiplanar multisequence MR imaging of the pelvis was performed both before and after administration of intravenous contrast. CONTRAST:  5mL GADAVIST GADOBUTROL 1 MMOL/ML IV SOLN COMPARISON:  CT of 02/04/2024 FINDINGS: Urinary Tract: Primarily decompressed urinary bladder. Left renal sinus cysts without hydronephrosis. Bowel: Fluid-filled small bowel loops within the upper and mid pelvis measure up to 2.0 cm on 19/17. Improved from 3.4 cm on the prior exam. Again identified is an intussusception of the sigmoid into the rectum. There is mild hyperenhancement within the lead point including on 41/17, without restricted diffusion in this area. Vascular/Lymphatic: Ileocolic mesenteric adenopathy including at 2.2 x 1.8 cm on 08/17. No pelvic  sidewall adenopathy. Reproductive: The endometrium is thickened for age including at 1.0 cm on 18/4. This is followed to the level of the cervix and lower uterine segment, where soft tissue fullness is again identified including on 18/4 and 21/3. Correlate restricted diffusion on 22/11. Example 3.7 x 3.7 cm on 31/17. There is amorphous soft tissue fullness within both adnexa, greater right than left. Example restricted diffusion on 17/11. The right adnexal soft tissue fullness measures 3.3 x 3.3 cm on 18/17. No separate ovarian tissue identified. Other: Peritoneal carcinomatosis, as evidenced by diffuse peritoneal enhancement and restricted diffusion (example 22/11). Small volume pelvic fluid. Musculoskeletal: No acute osseous abnormality. IMPRESSION: 1. Abnormal amorphous soft tissue throughout lower uterine segment and cervix could represent primary uterine/cervical carcinoma or metastatic disease. 2. Diffuse peritoneal carcinomatosis as better imaged on dedicated CT. Ileocolic mesenteric nodal  metastasis. 3. Right greater than left adnexal soft tissue fullness likely represents ovarian metastasis. 4. Chronic or recurrent sigmoid/rectal intussusception since 02/04/2024. Vague hyperenhancement in the region of the lead point, without dominant mass. Recommend physical exam correlation to exclude either primary rectosigmoid carcinoma or serosal sigmoid implant lead point. 5. Small volume pelvic fluid. 6. Improved small bowel dilatation since prior CT. Electronically Signed   By: Jeronimo Greaves M.D.   On: 02/11/2024 15:42   DG Abd 1 View Result Date: 02/10/2024 CLINICAL DATA:  16109 Abdominal distention [id number] [phone] SBO (small bowel obstruction) (HCC) [id number] EXAM: ABDOMEN - 1 VIEW COMPARISON:  02/08/2024 FINDINGS: NG tube terminates within the proximal stomach. Worsening small bowel dilation in the mid abdomen measuring up to 5.3 cm (previously 3.3 cm. Paucity of bowel gas within the colon. No gross free intraperitoneal air. IMPRESSION: 1. Worsening small bowel obstruction. 2. NG tube terminates within the proximal stomach. Recommend advancement. Electronically Signed   By: Duanne Guess D.O.   On: 02/10/2024 09:35   DG Chest 1 View Result Date: 02/08/2024 CLINICAL DATA:  PICC line placement EXAM: CHEST  1 VIEW COMPARISON:  02/05/2024 FINDINGS: Right PICC line in place with the tip in the SVC. NG tube is in the stomach. Heart and mediastinal contours within normal limits. No confluent opacities or effusions. No acute bony abnormality. IMPRESSION: Right PICC line tip in the SVC. No active cardiopulmonary disease. Electronically Signed   By: Charlett Nose M.D.   On: 02/08/2024 17:51   [place] EKG SITE RITE Result Date: 02/08/2024 If Site Rite image not attached, placement could not be confirmed due to current cardiac rhythm.  DG Abd Portable 1V Result Date: 02/08/2024 CLINICAL DATA:  Check gastric catheter placement EXAM: PORTABLE ABDOMEN - 1 VIEW COMPARISON:  02/04/2024 FINDINGS: Scattered large and  small bowel gas is noted. Mild small bowel dilatation is noted significantly improved when compared with the prior exam. Gastric catheter is noted with the tip in the stomach. No free air is noted. IMPRESSION: Persistent but significantly improved small bowel dilatation when compared with the prior exam. Electronically Signed   By: Alcide Clever M.D.   On: 02/08/2024 09:34   US Paracentesis Result Date: 02/05/2024 INDICATION: 89 year old with new abdominal distention, ascites. Request made for diagnostic and therapeutic paracentesis. EXAM: ULTRASOUND GUIDED DIAGNOSTIC  PARACENTESIS MEDICATIONS: 10 mL 1% lidocaine. COMPLICATIONS: None immediate. PROCEDURE: Informed written consent was obtained from the patient after a discussion of the risks, benefits and alternatives to treatment. A timeout was performed prior to the initiation of the procedure. Initial ultrasound scanning demonstrates a small amount of ascites within the left lateral abdomen  interlooped with bowel. The left lateral abdomen was prepped and draped in the usual sterile fashion. 1% lidocaine was used for local anesthesia. Following this, a 19 gauge, 7-cm, Yueh catheter was introduced. An ultrasound image was saved for documentation purposes. The paracentesis was performed. The catheter was removed and a dressing was applied. The patient tolerated the procedure well without immediate post procedural complication. FINDINGS: A total of approximately 100 mL of yellow fluid was removed. Samples were sent to the laboratory as requested by the clinical team. IMPRESSION: Successful ultrasound-guided paracentesis yielding 100 mL of peritoneal fluid. Performed by: Loyce Dys PA-C Electronically Signed   By: Malachy Moan M.D.   On: 02/05/2024 14:31   US PELVIC COMPLETE WITH TRANSVAGINAL Result Date: 02/05/2024 CLINICAL DATA:  89 year old female with ascites, peritoneal carcinomatosis on CT Abdomen and Pelvis yesterday. EXAM: TRANSABDOMINAL AND  TRANSVAGINAL ULTRASOUND OF PELVIS TECHNIQUE: Both transabdominal and transvaginal ultrasound examinations of the pelvis were performed. Transabdominal technique was performed for global imaging of the pelvis including uterus, ovaries, adnexal regions, and pelvic cul-de-sac. It was necessary to proceed with endovaginal exam following the transabdominal exam to visualize the ovaries. COMPARISON:  CT Abdomen and Pelvis 02/04/2024. FINDINGS: Uterus Measurements: 6.9 x 3.9 x 3.9 cm = volume: 41 mL. No obvious myometrial mass. Endometrium Heterogeneous hypoechoic area at the fundus endometrium (image 49 of series 1). This area encompasses about 2 cm (image 55). See also cine series 3 images. Otherwise the echogenic endometrial stripe is up to 5 mm. Right ovary Measurements: 3.5 x 2.1 x 2.8 cm = volume: 11 mL. No ovarian mass identified. Left ovary Measurements: 2.3 x 1.5 x 1.7 cm = volume: 3 mL. No ovarian mass identified. Other findings Small volume ascites again visible in the pelvis. IMPRESSION: 1. No ovarian mass identified by ultrasound. 2. There is a 2 cm heterogeneous area at the uterine fundus which is favored to be endometrial related. Elsewhere the endometrial stripe is about 5 mm. An Endometrial Malignancy is not excluded. 3. Ascites, in conjunction with the extensive additional abdominal and pelvic abnormalities demonstrated by CT yesterday. Electronically Signed   By: Odessa Fleming M.D.   On: 02/05/2024 12:17   DG Chest Portable 1 View Result Date: 02/05/2024 CLINICAL DATA:  NG tube replacement confirmation EXAM: PORTABLE CHEST - 1 VIEW COMPARISON:  02/04/2024 FINDINGS: Cardiomediastinal silhouette and pulmonary vasculature are within normal limits. Lungs are clear. Nasogastric tube extends to the left upper quadrant the side hole located at the level of the gastroesophageal junction. It is slightly retracted compared to prior exam. IMPRESSION: Nasogastric tube terminates in the left upper quadrant in the  expected site of the stomach. The side hole of the NG tube is located at the level of the gastroesophageal junction. Electronically Signed   By: Acquanetta Belling M.D.   On: 02/05/2024 07:38   DG Chest Portable 1 View Result Date: 02/04/2024 CLINICAL DATA:  NG tube placement. Abdominal discomfort, heartburn, and nausea. EXAM: PORTABLE CHEST 1 VIEW COMPARISON:  None Available. FINDINGS: The heart size and mediastinal contours are within normal limits. There is atherosclerotic calcification of the aorta. No consolidation, effusion, or pneumothorax. An enteric tube terminates in the stomach. No acute osseous abnormality. IMPRESSION: 1. No active disease. 2. Enteric tube terminates in the stomach. Electronically Signed   By: Thornell Sartorius M.D.   On: 02/04/2024 21:20   CT ABDOMEN PELVIS W CONTRAST Result Date: 02/04/2024 CLINICAL DATA:  Abdominal pain, bloating, nausea and vomiting for 2 weeks. * Tracking  Code: BO * EXAM: CT ABDOMEN AND PELVIS WITH CONTRAST TECHNIQUE: Multidetector CT imaging of the abdomen and pelvis was performed using the standard protocol following bolus administration of intravenous contrast. RADIATION DOSE REDUCTION: This exam was performed according to the departmental dose-optimization program which includes automated exposure control, adjustment of the mA and/or kV according to patient size and/or use of iterative reconstruction technique. CONTRAST:  OMNIPAQUE IOHEXOL 300 MG/ML  SOLN COMPARISON:  None Available. FINDINGS: Lower Chest: No acute findings. Hepatobiliary: A few tiny hepatic cysts are seen, however, there are multiple hypovascular masses involving the right and left hepatic lobes, largest in the central right hepatic lobe measuring 1.9 x 1.5 cm on image 26/2. These are consistent with liver metastases. Gallbladder is unremarkable. No evidence of biliary ductal dilatation. Pancreas:  No mass or inflammatory changes. Spleen: Within normal limits in size and appearance.  Adrenals/Urinary Tract: No suspicious masses identified. No evidence of ureteral calculi or hydronephrosis. Stomach/Bowel: Distal small bowel obstruction, with transition point in the right lower quadrant, likely due to peritoneal carcinoma. Rectal intussusception incidentally noted. Vascular/Lymphatic: Mild retroperitoneal lymphadenopathy in the left para-aortic and aortocaval spaces. No acute vascular findings. Reproductive: A 1.6 cm peripherally calcified fibroid is seen in the uterine fundus. Poorly defined masslike soft tissue prominence is seen in the region of the cervix and lower uterine segment measuring 3.6 x 3.3 cm on image 63/2, possibly representing a primary cervical or endometrial carcinoma. Poorly defined soft tissue density is seen in both adnexal regions and along the uterine fundus, consistent with peritoneal carcinomatosis. Other: Mild ascites is seen. Peritoneal and omental soft tissue nodules and masses are seen throughout the abdomen and pelvis, consistent with peritoneal carcinomatosis. Largest conglomerate mass is seen in the right abdomen measuring approximately 6.7 x 3.4 cm on image 37/2. Musculoskeletal:  No suspicious bone lesions identified. IMPRESSION: Diffuse peritoneal carcinomatosis and mild ascites. Distal small bowel obstruction, with transition point in the right lower quadrant, likely due to peritoneal carcinomatosis. Masslike soft tissue prominence in the region of the lower uterine segment and cervix, possibly representing primary cervical or endometrial carcinoma. Liver metastases. Mild retroperitoneal lymphadenopathy, consistent with metastatic disease. Rectal intussusception incidentally noted. No definite lead mass identified. Electronically Signed   By: Danae Orleans M.D.   On: 02/04/2024 11:34  Ms. Hams was interviewed and examined.  She reports several small bowel movements last night.  No new complaint.  The abdomen is softer and less distended today.  She is now at  day 5 following cycle 1 Taxol/carboplatin.  She continues G-CSF.  The white count and platelets are adequate. There are signs of improvement of the bowel obstruction. Recommendations: 1.  Continue G-CSF, check CBC 02/18/2024 2.  Management of the bowel obstruction per GYN oncology 3.  Continue heparin, transition to apixaban when she is taking p.o.

## 2024-02-16 NOTE — Plan of Care (Signed)

## 2024-02-16 NOTE — Progress Notes (Signed)
 Mobility Specialist - Progress Note   02/16/24 1143  Mobility  Activity Ambulated with assistance in hallway  Level of Assistance Modified independent, requires aide device or extra time  Assistive Device Front wheel walker  Distance Ambulated (ft) 500 ft  Activity Response Tolerated well  Mobility Referral Yes  Mobility visit 1 Mobility  Mobility Specialist Start Time (ACUTE ONLY) 1125  Mobility Specialist Stop Time (ACUTE ONLY) 1140  Mobility Specialist Time Calculation (min) (ACUTE ONLY) 15 min   Pt received in bed and agreeable to mobility. No complaints during session. Pt to bed after session with all needs met.    [facility]

## 2024-02-16 NOTE — Progress Notes (Signed)
 PHARMACY - TOTAL PARENTERAL NUTRITION CONSULT NOTE   Indication: SBO  Assessment: 14 yoF admitted 2/7 after 2 weeks of progressively worsening reflux and abdominal distention with n/v. 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 Plan for bowel rest +/- venting G-tube while patient undergoes chemotherapy to hopefully relieve obstruction. Pharmacy consulted for TPN management.  Glucose / Insulin: no hx DM; CBG goal 100-150 - Dexamethasone 8 mg daily as part of chemo regimen - CBGs remain very well controlled despite scheduled steroids; adjusted SSI to once daily - no SSI used yesterday Electrolytes: no labs 2/19 - 2/18: Na, bicarb borderline low but stable; all others stable WNL Renal: UOP borderline low per charting, although per RN was making good/frequent urine yesterday - 2/18: SCr stable WNL (baseline < 1.0); BUN mildly elevated and rising Hepatic:  - 2/18: mild transaminitis noted 2/17 (previously WNL); stable today along with Tbili - TG WNL 2/17 I/O: NG output stable from yesterday (just under 1000 ml/day) - 1 small stool 2/18 - no mIVF GI Imaging: - 2/11 AXR: persistent but significantly improved SB dilation when compared with prior - 2/13 AXR: Worsening small bowel obstruction.  - 2/14 MRI pelvis: Abnormal amorphous soft tissue throughout lower uterine segment and cervix with ileocolic and likely ovarian mets. GI Surgeries / Procedures: NA  Central access: PICC TPN start date: 2/11  Nutritional Goals: Goal TPN rate is 70 mL/hr (provides 89 g of protein and 1717 kcals per day)  RD Assessment: Estimated Needs Total Energy Estimated Needs: 1625-1900 kcal Total Protein Estimated Needs: 80-95g Total Fluid Estimated Needs: 1.9L/day  Current Nutrition: NPO  Plan:  Continue TPN/Lipids at 70 mL/hr Electrolytes in TPN: no changes from yesterday Na - 80 mEq/L K - 40 mEq/L Ca - 3 mEq/L Mg - 5 mEq/L Phos - 15 mmol/L Cl:Ac ratio - 1:2 Add standard MVI and trace elements to TPN Continue to  hold chromium for national backorder Given good CBG control on goal rate TPN/steroids, continue CBGs once daily in the afternoon (usually her highest reading of the day) mIVF per MD Monitor TPN labs on Mon/Thurs  Bernadene Person, PharmD, BCPS [phone] 02/16/2024, 7:05 AM

## 2024-02-16 NOTE — Plan of Care (Signed)
   Problem: Education: Goal: Knowledge of General Education information will improve Description: Including pain rating scale, medication(s)/side effects and non-pharmacologic comfort measures Outcome: Progressing   Problem: Health Behavior/Discharge Planning: Goal: Ability to manage health-related needs will improve Outcome: Progressing   Problem: Clinical Measurements: Goal: Will remain free from infection Outcome: Progressing Goal: Diagnostic test results will improve Outcome: Progressing Goal: Respiratory complications will improve Outcome: Progressing Goal: Cardiovascular complication will be avoided Outcome: Progressing   Problem: Activity: Goal: Risk for activity intolerance will decrease Outcome: Progressing   Problem: Nutrition: Goal: Adequate nutrition will be maintained Outcome: Progressing   Problem: Coping: Goal: Level of anxiety will decrease Outcome: Progressing   Problem: Elimination: Goal: Will not experience complications related to bowel motility Outcome: Progressing Goal: Will not experience complications related to urinary retention Outcome: Progressing   Problem: Pain Managment: Goal: General experience of comfort will improve and/or be controlled Outcome: Progressing   Problem: Safety: Goal: Ability to remain free from injury will improve Outcome: Progressing   Problem: Skin Integrity: Goal: Risk for impaired skin integrity will decrease Outcome: Progressing

## 2024-02-16 NOTE — Progress Notes (Signed)
 GYN Onc Progress Note  Subjective: Patient reports several small bowel movements yesterday and this am. Has had flatus with the BMs and another small episode separately. BMs are coming easily without straining. Abdomen does not feel as tight. No concerns voiced.   Objective: Vital signs in last 24 hours: Temp:  [97.5 F (36.4 C)-98.2 F (36.8 C)] 97.5 F (36.4 C) (02/19 0416) Pulse Rate:  [71-79] 79 (02/19 0416) Resp:  [16-18] 18 (02/19 0416) BP: (111-133)/(56-71) 133/71 (02/19 0416) SpO2:  [93 %-95 %] 93 % (02/19 0416) Last BM Date : 02/13/24  Intake/Output from previous day: 02/18 0701 - 02/19 0700 In: 1592 [P.O.:120; I.V.:1472] Out: 900 [Urine:400; Emesis/NG output:500]  Physical Examination (performed by Dr. Pricilla Holm): Gen: NAD, alert and oriented;  CV: RRR, no murmurs or rubs Pulm: No rhonchi or wheezing Abd: softer, moderately distended, hypoactive BS, nontender Ext: warm and well perfused, no edema or tenderness to palpation  Labs:    Latest Ref Rng & Units 02/16/2024    1:38 AM 02/14/2024    2:00 AM 02/13/2024    5:57 AM  CBC  WBC 4.0 - 10.5 K/uL 29.9  35.7  10.3   Hemoglobin 12.0 - 15.0 g/dL 9.2  9.9  9.9   Hematocrit 36.0 - 46.0 % 29.5  30.6  31.8   Platelets 150 - 400 K/uL 253  320  329       Latest Ref Rng & Units 02/15/2024    5:15 AM 02/14/2024    2:00 AM 02/13/2024    5:57 AM  BMP  Glucose 70 - 99 mg/dL 98  098  119   BUN 8 - 23 mg/dL 34  33  31   Creatinine 0.44 - 1.00 mg/dL 1.47  8.29  5.62   Sodium 135 - 145 mmol/L 136  135  139   Potassium 3.5 - 5.1 mmol/L 4.3  4.4  4.4   Chloride 98 - 111 mmol/L 108  107  110   CO2 22 - 32 mmol/L 23  20  23    Calcium 8.9 - 10.3 mg/dL 8.0  7.9  8.0    Echocardiogram performed 02/13/2024: EF 65-70%  Doppler from 02/12/24 with +DVT in LLE  Assessment: 89 y.o. 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 Truett Perna. PE  diagnosed on staging CT chest from 02/11/2024-currently on heparin drip.   Metastatic cancer: Based on Dr. 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  She has a pathologically enlarged left axillary lymph node which they had discussed potentially biopsying both for additional tissue diagnosis and next generation sequencing. Based on long discussions with the patient, her family, and Dr. Pricilla Holm and in line with her goals of care, plan to start dose reduced chemotherapy with carboplatin and paclitaxel today in the hopes of symptom improvement.  Malignant bowel obstruction: Plan to continue bowel rest with NG tube in place, GI prophylaxis, and dexamethasone.  Given anticipated prolonged period requiring n.p.o. status, patient was started on TPN last week.  Dr. Pricilla Holm discussed the possibility of future G-tube placement to allow the patient to go home from the hospital although she would favor continued conservative management in the hope that chemotherapy may help relieve her malignant obstruction.  She has had increasing GI function. Plan for imaging with oral contrast in the future.   Pulmonary embolism: Dr. Pricilla Holm  previously discussed with the patient hypercoagulable state that cancer causes. Doppler from 02/12/2024 +for DVT in LLE. Recommend no use of SCDs.  Patient started on heparin drip 02/11/24.  Goal would be to transition to DOAC once she is able to take p.o. and ready for discharge.    LOS: 12 days    Tracy Preston 02/16/2024, 8:04 AM

## 2024-02-16 NOTE — Progress Notes (Signed)
 PHARMACY - ANTICOAGULATION CONSULT NOTE  Pharmacy Consult for heparin  Indication: acute pulmonary embolus and DVT  Allergies  Allergen Reactions   Calcium-Containing Compounds Other (See Comments)    Upsets the stomach, so it is not taken very often    Patient Measurements: Height: 5\' 4"  (162.6 cm) Weight: 53.9 kg (118 lb 14.4 oz) IBW/kg (Calculated) : 54.7 Heparin Dosing Weight: total body weight   Vital Signs: Temp: 97.5 F (36.4 C) (02/19 0416) Temp Source: Oral (02/19 0416) BP: 133/71 (02/19 0416) Pulse Rate: 79 (02/19 0416)  Labs: Recent Labs    02/14/24 0200 02/15/24 0515 02/15/24 1500 02/15/24 1759 02/16/24 0138  HGB 9.9*  --   --   --  9.2*  HCT 30.6*  --   --   --  29.5*  PLT 320  --   --   --  253  HEPARINUNFRC 0.45 0.21* 0.20* 0.33 0.23*  CREATININE 0.75 0.77  --   --   --     Estimated Creatinine Clearance: 43 mL/min (by C-G formula based on SCr of 0.77 mg/dL).   Medical History: Past Medical History:  Diagnosis Date   Cancer (HCC)    skin cancer leg -no problems now   GERD (gastroesophageal reflux disease)    mild -no meds   Heart murmur    "faint"   Hypertension     Assessment: 89 y/o F with metastatic ovarian cancer admitted with SBO secondary to peritoneal carcinomatosis. Chest CT on 2/14 showed incidental acute PE in the "the distal right pulmonary artery and lobar and segmental right upper lobe and right middle lobe pulmonary artery branches" with borderline elevated RV/LV ratio.  LE doppler on 02/12/24 positive for acute DVT in the left peroneal veins and left posterior tibial veins. Pharmacy consulted for IV heparin dosing for VTE treatment.  Significant events:  - 2/15: received carboplatin/paclitaxel   Today, 02/16/2024: HL remains subtherapeutic and unchanged despite ~2 unit/kg/hr rate increase to 950 units/hr Hgb low but stable, plts WNL No bleeding or interruptions noted  Goal of Therapy:  Heparin level 0.3-0.7  units/ml Monitor platelets by anticoagulation protocol: Yes   Plan:  Increase heparin drip to 1150 units/hr Repeat heparin level in 8 hours  Daily CBC, heparin level Monitor closely for s/sx of bleeding   Bernadene Person, PharmD, BCPS [phone] 02/16/2024, 2:49 PM

## 2024-02-16 NOTE — Progress Notes (Signed)
 PROGRESS NOTE  KHRISTIE SAK  JJO:[id number] DOB: 28-Jul-1935 DOA: 02/04/2024 PCP: Marden Noble, MD (Inactive)   Brief Narrative: Patient is a 89 year old female with history of hypertension, breast cancer who presented with abdominal distention, vomiting for 2 weeks.  CT abdomen was concerning for new metastatic disease from possible GYN origin, SBO.  Further workup showed peritoneal carcinomatosis with likely GYN malignancy.  GYN oncology, general surgery consulted pelvic MRI showed advanced GYN malignancy.  CT chest with contrast showed distal pulmonary present with cor pulmonale, started on heparin drip.  Received first cycle of chemo on 2/15.  Hospital course remarkable for persistent bowel obstruction, started on TPN, Decadron.  Palliative care following.    Assessment & Plan:  Principal Problem:   Carcinomatosis peritonei causing obstruction Active Problems:   SBO (small bowel obstruction) (HCC)   GERD (gastroesophageal reflux disease)   Essential hypertension   Peritoneal carcinomatosis (HCC)   Anemia   Malnutrition of moderate degree   Palliative care encounter   Adenocarcinoma [facility])   Malignant neoplasm metastatic to liver [facility])   DNR (do not resuscitate)   Goals of care, counseling/discussion   Need for emotional support   Counseling and coordination of care  Malignant small bowel obstruction secondary to peritoneal carcinomatosis/ovarian adenocarcinoma with metastasis: Presented with abdominal distention, vomiting.GYN oncology, general surgery consulted.The  pelvic MRI showed advanced GYN malignancy.  CT chest with contrast showed distal PE, started on heparin drip.  Received first cycle of chemo on 2/15.  Hospital course remarkable for persistent bowel obstruction, started on TPN, Decadron. 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  Patient finally had a small bowel movement last night.  Passing gas.  Abdomen  is soft and not distended.   May need octreotide, venting G-tube at some point if continues to have bowel obstruction. Palliative care following for goals of care  Right-sided distal PE: Started on heparin drip.  Lower EXTR Doppler positive for DVT.  Eventual plan is to transition to Eliquis.  Echo showed preserved EF  Leukocytosis: She received Granix by oncology.  This could be current.  With leukocytosis.  No fever.  Poor nutritional status/severe protein calorie malnutrition: On TPN  Hypertension: On losartan.  Continue as needed medications for severe hypertension  GERD: Continue PPI  Normocytic anemia: Currently hemoglobin stable    Nutrition Problem: Moderate Malnutrition Etiology: acute illness    DVT prophylaxis:iv heparin     Code Status: Limited: Do not attempt resuscitation (DNR) -DNR-LIMITED -Do Not Intubate/DNI   Family Communication:None at bedside   Patient status:Inpatient  Patient is from :Home  Anticipated discharge ZS:[id number]  Estimated DC date:unsure   Consultants: General Surgery, palliative care, oncology  Procedures: None yet  Antimicrobials:  Anti-infectives (From admission, onward)    None       Subjective: Patient seen and examined at bedside today.  She looks comfortable.  Lying in bed.  Alert and oriented.  She had some bowel movement last night.  Passing gas.  Did not complain of any nausea, vomiting or abdominal pain today.  NG tube still draining brownish fluid TPN running.  Objective: Vitals:   02/15/24 1350 02/15/24 2147 02/16/24 0416 02/16/24 0700  BP: 111/61 (!) 114/56 133/71   Pulse: 77 71 79   Resp: 16 18 18    Temp: 98.1 F (36.7 C) 98.2 F (36.8 C) (!) 97.5 F (36.4 C)   TempSrc: Oral Oral Oral   SpO2: 95% 93% 93%   Weight:  56.8 kg  Height:        Intake/Output Summary (Last 24 hours) at 02/16/2024 0953 Last data filed at 02/16/2024 0858 Gross per 24 hour  Intake 2060.96 ml  Output 950 ml  Net 1110.96 ml    Filed Weights   02/11/24 1610 02/11/24 0900 02/16/24 0700  Weight: 53.9 kg 53.9 kg 56.8 kg    Examination:  General exam: Overall comfortable, not in distress, pleasant elderly female HEENT: PERRL, NG tube Respiratory system:  no wheezes or crackles  Cardiovascular system: S1 & S2 heard, RRR.  Gastrointestinal system: Abdomen is mildly distended, soft and nontender.  Bowel sounds present Central nervous system: Alert and oriented Extremities: No edema, no clubbing ,no cyanosis Skin: No rashes, no ulcers,no icterus     Data Reviewed: I have personally reviewed following labs and imaging studies  CBC: Recent Labs  Lab 02/11/24 0337 02/12/24 0230 02/13/24 0557 02/14/24 0200 02/16/24 0138  WBC 9.6 11.2* 10.3 35.7* 29.9*  NEUTROABS  --   --   --   --  22.3*  HGB 10.7* 10.1* 9.9* 9.9* 9.2*  HCT 33.5* 31.7* 31.8* 30.6* 29.5*  MCV 98.0 96.6 98.5 97.5 99.0  PLT 304 318 329 320 253   Basic Metabolic Panel: Recent Labs  Lab 02/10/24 0345 02/11/24 0337 02/12/24 0230 02/13/24 0557 02/14/24 0200 02/15/24 0515  NA 140 141 139 139 135 136  K 3.9 4.2 4.0 4.4 4.4 4.3  CL 109 111 110 110 107 108  CO2 25 22 22 23  20* 23  GLUCOSE 124* 130* 105* 115* 119* 98  BUN 24* 28* 27* 31* 33* 34*  CREATININE 0.87 0.84 0.73 0.73 0.75 0.77  CALCIUM 8.2* 8.7* 8.2* 8.0* 7.9* 8.0*  MG 1.9 2.0 2.0 2.2 2.4 2.2  PHOS 3.3  --   --   --  2.8 2.8     No results found for this or any previous visit (from the past 240 hours).   Radiology Studies: No results found.  Scheduled Meds:  Chlorhexidine Gluconate Cloth  6 each Topical Daily   dexamethasone (DECADRON) injection  8 mg Intravenous Daily   insulin aspart  0-9 Units Subcutaneous q1600   losartan  100 mg Per Tube q morning   pantoprazole (PROTONIX) IV  40 mg Intravenous Q12H   sodium chloride flush  10-40 mL Intracatheter Q12H   Tbo-Filgrastim  300 mcg Subcutaneous q1800   Continuous Infusions:  heparin 950 Units/hr (02/16/24 0441)    TPN ADULT (ION) 70 mL/hr at 02/16/24 0341   TPN ADULT (ION)       LOS: 12 days   Burnadette Pop, MD [facility] P2/19/2025, 9:53 AM

## 2024-02-16 NOTE — Progress Notes (Signed)
  Daily Progress Note   Patient Name: Tracy Preston       Date: 02/16/2024 DOB: 05-05-1935  Age: 89 y.o. MRN#: [id number] Attending Physician: Burnadette Pop, MD Primary Care Physician: Marden Noble, MD (Inactive) Admit Date: 02/04/2024 Length of Stay: 12 days  Reason for Consultation/Follow-up: Establishing goals of care  Subjective:   Reviewed EMR prior to presenting to bedside.  NG tube remains in place, currently resting in bed.   Vital Signs:  BP 133/71 (BP Location: Left Arm)   Pulse 79   Temp (!) 97.5 F (36.4 C) (Oral)   Resp 18   Ht 5\' 4"  (1.626 m)   Wt 56.8 kg   SpO2 93%   BMI 21.49 kg/m   Physical Exam:  General: NAD,  Has NGT Cardiovascular: RRR, no edema in LE b/l Respiratory: no increased work of breathing noted, not in respiratory distress Skin: no rashes or lesions on visible skin Neuro: A&Ox4, following commands easily Psych: appropriately answers all questions  Imaging: I personally reviewed recent imaging.   Assessment & Plan:   Assessment: Patient is a 89 year old female with past medical history of hypertension, CKD, and family history of breast cancer who was admitted on 02/04/2024 for management of abdominal distention and vomiting for approximately 2 weeks.  Imaging concerning for new metastatic gynecological malignancy with small bowel obstruction.  Imaging also showed likely metastatic disease to liver and peritoneal carcinomatosis.  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  Gyn Onc and oncology consulted for recommendations.  Palliative medicine team consulted to assist with complex medical decision making.   Recommendations/Plan: # Complex medical decision making/goals of care: -Interdisciplinary discussions between medical oncology hospital medicine and GYN oncology took place on 02-15-2024.  Continue NG tube, continue TPN, continue to monitor.                  -  Code Status: Limited: Do not attempt resuscitation  (DNR) -DNR-LIMITED -Do Not Intubate/DNI    # Symptom management Patient is receiving these palliative interventions for symptom management with an intent to improve quality of life.                 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  Receiving IV dexamethasone for symptom management.       -Patient continuing to receive IV dexamethasone 8 mg daily.  Based on plan for NG tube removal, will consider decreasing dose in upcoming days.  # Psycho-social/Spiritual Support:  - Support System: daughters, son-in-law, 8 grandchildren    # Discharge Planning:  To Be Determined  -Have placed outpatient PMT referral to follow-up at [facility] with hope patient will be able to complete this.  Discussed with: IDT  Thank you for allowing the palliative care team to participate in the care [name].  low MDM Rosalin Hawking MD Palliative Care Provider PMT # [phone]  If patient remains symptomatic despite maximum doses, please call PMT at [phone] between 0700 and 1900. Outside of these hours, please call attending, as PMT does not have night coverage.

## 2024-02-17 DIAGNOSIS — C786 Secondary malignant neoplasm of retroperitoneum and peritoneum: Secondary | ICD-10-CM | POA: Diagnosis not present

## 2024-02-17 DIAGNOSIS — K56609 Unspecified intestinal obstruction, unspecified as to partial versus complete obstruction: Secondary | ICD-10-CM | POA: Diagnosis not present

## 2024-02-17 DIAGNOSIS — C787 Secondary malignant neoplasm of liver and intrahepatic bile duct: Secondary | ICD-10-CM | POA: Diagnosis not present

## 2024-02-17 DIAGNOSIS — Z515 Encounter for palliative care: Secondary | ICD-10-CM | POA: Diagnosis not present

## 2024-02-17 DIAGNOSIS — C801 Malignant (primary) neoplasm, unspecified: Secondary | ICD-10-CM | POA: Diagnosis not present

## 2024-02-17 LAB — CBC WITH DIFFERENTIAL/PLATELET
Abs Immature Granulocytes: 2.19 10*3/uL — ABNORMAL HIGH (ref 0.00–0.07)
Basophils Absolute: 0.2 10*3/uL — ABNORMAL HIGH (ref 0.0–0.1)
Basophils Relative: 1 %
Eosinophils Absolute: 0.2 10*3/uL (ref 0.0–0.5)
Eosinophils Relative: 1 %
HCT: 30.5 % — ABNORMAL LOW (ref 36.0–46.0)
Hemoglobin: 9.4 g/dL — ABNORMAL LOW (ref 12.0–15.0)
Immature Granulocytes: 8 %
Lymphocytes Relative: 6 %
Lymphs Abs: 1.6 10*3/uL (ref 0.7–4.0)
MCH: 30.7 pg (ref 26.0–34.0)
MCHC: 30.8 g/dL (ref 30.0–36.0)
MCV: 99.7 fL (ref 80.0–100.0)
Monocytes Absolute: 0.8 10*3/uL (ref 0.1–1.0)
Monocytes Relative: 3 %
Neutro Abs: 21.3 10*3/uL — ABNORMAL HIGH (ref 1.7–7.7)
Neutrophils Relative %: 81 %
Platelets: 252 10*3/uL (ref 150–400)
RBC: 3.06 MIL/uL — ABNORMAL LOW (ref 3.87–5.11)
RDW: 13.1 % (ref 11.5–15.5)
WBC: 26.4 10*3/uL — ABNORMAL HIGH (ref 4.0–10.5)
nRBC: 0 % (ref 0.0–0.2)

## 2024-02-17 LAB — COMPREHENSIVE METABOLIC PANEL
ALT: 110 U/L — ABNORMAL HIGH (ref 0–44)
AST: 62 U/L — ABNORMAL HIGH (ref 15–41)
Albumin: 2.5 g/dL — ABNORMAL LOW (ref 3.5–5.0)
Alkaline Phosphatase: 184 U/L — ABNORMAL HIGH (ref 38–126)
Anion gap: 9 (ref 5–15)
BUN: 35 mg/dL — ABNORMAL HIGH (ref 8–23)
CO2: 23 mmol/L (ref 22–32)
Calcium: 8.2 mg/dL — ABNORMAL LOW (ref 8.9–10.3)
Chloride: 105 mmol/L (ref 98–111)
Creatinine, Ser: 0.71 mg/dL (ref 0.44–1.00)
GFR, Estimated: >60 mL/min (ref >60)
Glucose, Bld: 119 mg/dL — ABNORMAL HIGH (ref 70–99)
Potassium: 4.2 mmol/L (ref 3.5–5.1)
Sodium: 137 mmol/L (ref 135–145)
Total Bilirubin: 0.4 mg/dL (ref 0.0–1.2)
Total Protein: 5.3 g/dL — ABNORMAL LOW (ref 6.5–8.1)

## 2024-02-17 LAB — HEPARIN LEVEL (UNFRACTIONATED)
Heparin Unfractionated: 0.38 [IU]/mL (ref 0.30–0.70)
Heparin Unfractionated: 0.58 [IU]/mL (ref 0.30–0.70)

## 2024-02-17 LAB — PHOSPHORUS: Phosphorus: 3.2 mg/dL (ref 2.5–4.6)

## 2024-02-17 LAB — MAGNESIUM: Magnesium: 2.1 mg/dL (ref 1.7–2.4)

## 2024-02-17 LAB — GLUCOSE, CAPILLARY
Glucose-Capillary: 110 mg/dL — ABNORMAL HIGH (ref 70–99)
Glucose-Capillary: 154 mg/dL — ABNORMAL HIGH (ref 70–99)

## 2024-02-17 MED ORDER — FILGRASTIM-AAFI 480 MCG/0.8ML IJ SOSY
300.0000 ug | PREFILLED_SYRINGE | Freq: Once | INTRAMUSCULAR | Status: AC
  Administered 2024-02-17: 300 ug via SUBCUTANEOUS
  Filled 2024-02-17: qty 0.5

## 2024-02-17 MED ORDER — TRAVASOL 10 % IV SOLN
INTRAVENOUS | Status: AC
Start: 2024-02-17 — End: 2024-02-18
  Filled 2024-02-17: qty 890.4

## 2024-02-17 MED ORDER — FILGRASTIM-AAFI 300 MCG/ML IJ SOLN
300.0000 ug | Freq: Once | INTRAMUSCULAR | Status: DC
  Filled 2024-02-17: qty 1

## 2024-02-17 NOTE — Progress Notes (Signed)
 PHARMACY - TOTAL PARENTERAL NUTRITION CONSULT NOTE   Indication: SBO  Assessment: 44 yoF admitted 2/7 after 2 weeks of progressively worsening reflux and abdominal distention with n/v. 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 Plan for bowel rest +/- venting G-tube while patient undergoes chemotherapy to hopefully relieve obstruction. Pharmacy consulted for TPN management.  Glucose / Insulin: no hx DM; CBG goal 100-150 - Dexamethasone 8 mg daily as part of chemo regimen - CBGs remain very well controlled despite scheduled steroids; adjusted SSI to once daily - 1 unit SSI used yesterday Electrolytes: all stable WNL Renal: SCr stable WNL; BUN mildly elevated and trending slowly up - UOP adequate per charting Hepatic: mild transaminitis from 2/17 persists - given time course, more likely d/t lysis of hepatic mets after chemo, versus TPN - Tbili remains stable WNL - TG WNL 2/17 I/O: improving bowel function noted 2/19 per GynOnc (flatus, small BMs) - NG output improved; down to < 500 ml yesterday - 4 small stool 2/19 - no mIVF GI Imaging: - 2/7 CT a/p: distal SBO d/t diffuse peritoneal carcinomatosis, with transition point in RLQ, hepatic and retroperitoneal metastases; incidental finding of rectal intussusception - 2/11 AXR: persistent but significantly improved SB dilation when compared with prior - 2/13 AXR: Worsening small bowel obstruction.  - 2/14 MRI pelvis: Abnormal amorphous soft tissue throughout lower uterine segment and cervix with ileocolic and likely ovarian mets. GI Surgeries / Procedures: NA  Central access: PICC TPN start date: 2/11  Nutritional Goals: Goal TPN rate is 70 mL/hr (provides 89 g of protein and 1717 kcals per day)  RD Assessment: Estimated Needs Total Energy Estimated Needs: 1625-1900 kcal Total Protein Estimated Needs: 80-95g Total Fluid Estimated Needs: 1.9L/day  Current Nutrition: NPO  Plan:  Continue TPN/Lipids at 70 mL/hr Electrolytes in TPN: no changes  from yesterday Na - 80 mEq/L K - 40 mEq/L Ca - 3 mEq/L Mg - 5 mEq/L Phos - 15 mmol/L Cl:Ac ratio - 1:2 Add standard MVI and trace elements to TPN Continue to hold chromium for national backorder Given good CBG control on goal rate TPN/steroids, will stop CBG checks (can still monitor via SBGs) mIVF per MD Monitor TPN labs on Mon/Thurs  Bernadene Person, PharmD, BCPS [phone] 02/17/2024, 7:18 AM

## 2024-02-17 NOTE — Progress Notes (Signed)
 GYN Onc Progress Note  Subjective: Patient reports having a bowel movement this am and small bowel movements with flatus through the night. No nausea or emesis reported. Continues to ambulate without difficulty. No side effects related to recent chemo treatment voiced. Daughter from [place] at the bedside. No concerns voiced.   Objective: Vital signs in last 24 hours: Temp:  [97.5 F (36.4 C)-97.9 F (36.6 C)] 97.9 F (36.6 C) (02/20 1526) Pulse Rate:  [73-77] 73 (02/20 1526) Resp:  [15-18] 15 (02/20 1526) BP: (96-118)/(60-74) 96/74 (02/20 1526) SpO2:  [94 %-100 %] 100 % (02/20 1526) Weight:  [124 lb 5.4 oz (56.4 kg)] 124 lb 5.4 oz (56.4 kg) (02/20 0716) Last BM Date : 02/17/24  Intake/Output from previous day: 02/19 0701 - 02/20 0700 In: 1988.4 [P.O.:50; I.V.:1938.4] Out: 1400 [Urine:975; Emesis/NG output:425]  Physical Examination (performed by Dr. Pricilla Holm): Gen: NAD, alert and oriented;  CV: RRR, no murmurs or rubs Pulm: No rhonchi or wheezing Abd: softer, moderately distended, hypoactive BS, nontender, NG tube to intermittent wall suction Ext: warm and well perfused, no edema or tenderness to palpation  Labs:    Latest Ref Rng & Units 02/17/2024    5:52 AM 02/16/2024    1:38 AM 02/14/2024    2:00 AM  CBC  WBC 4.0 - 10.5 K/uL 26.4  29.9  35.7   Hemoglobin 12.0 - 15.0 g/dL 9.4  9.2  9.9   Hematocrit 36.0 - 46.0 % 30.5  29.5  30.6   Platelets 150 - 400 K/uL 252  253  320       Latest Ref Rng & Units 02/17/2024    5:52 AM 02/15/2024    5:15 AM 02/14/2024    2:00 AM  BMP  Glucose 70 - 99 mg/dL 829  98  562   BUN 8 - 23 mg/dL 35  34  33   Creatinine 0.44 - 1.00 mg/dL 1.30  8.65  7.84   Sodium 135 - 145 mmol/L 137  136  135   Potassium 3.5 - 5.1 mmol/L 4.2  4.3  4.4   Chloride 98 - 111 mmol/L 105  108  107   CO2 22 - 32 mmol/L 23  23  20    Calcium 8.9 - 10.3 mg/dL 8.2  8.0  7.9    Echocardiogram performed 02/13/2024: EF 65-70%  Doppler from 02/12/24 with +DVT in  LLE  Assessment: 89 y.o. 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 Truett Perna. PE diagnosed on staging CT chest from 02/11/2024-currently on heparin drip.   Metastatic cancer: Based on Dr. 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  She has a pathologically enlarged left axillary lymph node which they had discussed potentially biopsying both for additional tissue diagnosis and next generation sequencing. Based on long discussions with the patient, her family, and Dr. Pricilla Holm and in line with her goals of care, plan to start dose reduced chemotherapy with carboplatin and paclitaxel today in the hopes of symptom improvement.  Malignant bowel obstruction: Plan for small bowel follow through imaging study tomorrow. Plan to continue bowel rest with NG tube in place, GI prophylaxis, and dexamethasone.  Given anticipated prolonged period requiring n.p.o. status, patient was started on TPN last week.  Dr. Pricilla Holm discussed the possibility of future G-tube placement to allow the patient to go home from the hospital  although she would favor continued conservative management in the hope that chemotherapy may help relieve her malignant obstruction.  She has had increasing GI function.   Pulmonary embolism: Dr. Pricilla Holm previously discussed with the patient hypercoagulable state that cancer causes. Doppler from 02/12/2024 +for DVT in LLE. Recommend no use of SCDs.  Patient started on heparin drip 02/11/24.  Goal would be to transition to DOAC once she is able to take p.o. and ready for discharge.    LOS: 13 days    Doylene Bode 02/17/2024, 4:13 PM

## 2024-02-17 NOTE — Plan of Care (Signed)

## 2024-02-17 NOTE — Progress Notes (Signed)
 PROGRESS NOTE  Tracy Preston  ZOX:[id number] DOB: 1935-06-29 DOA: 02/04/2024 PCP: Marden Noble, MD (Inactive)   Brief Narrative: Patient is a 89 year old female with history of hypertension, breast cancer who presented with abdominal distention, vomiting for 2 weeks.  CT abdomen was concerning for new metastatic disease from possible GYN origin, SBO.  Further workup showed peritoneal carcinomatosis with likely GYN malignancy.  GYN oncology, general surgery consulted pelvic MRI showed advanced GYN malignancy.  CT chest with contrast showed distal pulmonary present with cor pulmonale, started on heparin drip.  Received first cycle of chemo on 2/15.  Hospital course remarkable for persistent bowel obstruction, started on TPN, Decadron.  Palliative care following.  Now having bowel movements.  Plan is to do abdominal imaging with oral contrast   Assessment & Plan:  Principal Problem:   Carcinomatosis peritonei causing obstruction Active Problems:   SBO (small bowel obstruction) (HCC)   GERD (gastroesophageal reflux disease)   Essential hypertension   Peritoneal carcinomatosis (HCC)   Anemia   Malnutrition of moderate degree   Palliative care encounter   Adenocarcinoma [facility])   Malignant neoplasm metastatic to liver (HCC)   DNR (do not resuscitate)   Goals of care, counseling/discussion   Need for emotional support   Counseling and coordination of care  Malignant small bowel obstruction secondary to peritoneal carcinomatosis/ovarian adenocarcinoma with metastasis: Presented with abdominal distention, vomiting.GYN oncology, general surgery consulted.The  pelvic MRI showed advanced GYN malignancy.  CT chest with contrast showed distal PE, started on heparin drip.  Received first cycle of chemo on 2/15.  Hospital course remarkable for persistent bowel obstruction, started on TPN, Decadron. 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   Patient now having bowel movements.  Abdomen is soft and slightly distended.  Bowel sound present As per GYN oncology.  Plan for doing abdominal imaging with oral contrast in the next 1 to 2 days. May need octreotide, venting G-tube at some point if continues to have bowel obstruction.  Right-sided distal PE: Started on heparin drip.  Lower EXTR Doppler positive for DVT.  Eventual plan is to transition to Eliquis.  Echo showed preserved EF  Leukocytosis: She received Granix by oncology. Improving.   No fever.  Poor nutritional status/severe protein calorie malnutrition: On TPN  Hypertension: On losartan.  Continue as needed medications for severe hypertension  GERD: Continue PPI  Normocytic anemia: Currently hemoglobin stable  Goals of care:Palliative care was following for goals of care .CODE STATUS DNR   Nutrition Problem: Moderate Malnutrition Etiology: acute illness    DVT prophylaxis:iv heparin     Code Status: Limited: Do not attempt resuscitation (DNR) -DNR-LIMITED -Do Not Intubate/DNI   Family Communication: Called and discussed with son-in-law Dr. Venetia Maxon on phone on 2/20  Patient status:Inpatient  Patient is from :Home  Anticipated discharge WJ:XBJY  Estimated DC date:After oncology clearance   Consultants: General Surgery, palliative care, oncology  Procedures: None yet  Antimicrobials:  Anti-infectives (From admission, onward)    None       Subjective: Patient seen and examined at bedside today.  Hemodynamically stable.  Overall comfortable but denies abdomen pain, nausea or vomiting.  Again had a bowel movement last night.  NG tube draining brown fluid.  Abdomen slightly distended.  Objective: Vitals:   02/16/24 1437 02/16/24 2125 02/17/24 0451 02/17/24 0716  BP: 114/64 118/60 117/67   Pulse: 74 73 77   Resp: 17 17 18    Temp: (!) 97.4 F (36.3  C) (!) 97.5 F (36.4 C) 97.9 F (36.6 C)   TempSrc: Oral Oral Oral   SpO2: 94% 94% 96%   Weight:     56.4 kg  Height:        Intake/Output Summary (Last 24 hours) at 02/17/2024 1120 Last data filed at 02/17/2024 1100 Gross per 24 hour  Intake 1600.38 ml  Output 1750 ml  Net -149.62 ml   Filed Weights   02/11/24 0900 02/16/24 0700 02/17/24 0716  Weight: 53.9 kg 56.8 kg 56.4 kg    Examination:   General exam: Overall comfortable, not in distress, pleasant elderly female HEENT: PERRL, NG tube Respiratory system:  no wheezes or crackles  Cardiovascular system: S1 & S2 heard, RRR.  Gastrointestinal system: Abdomen is mildly distended, soft and nontender.  Bowel sounds present Central nervous system: Alert and oriented Extremities: No edema, no clubbing ,no cyanosis Skin: No rashes, no ulcers,no icterus     Data Reviewed: I have personally reviewed following labs and imaging studies  CBC: Recent Labs  Lab 02/12/24 0230 02/13/24 0557 02/14/24 0200 02/16/24 0138 02/17/24 0552  WBC 11.2* 10.3 35.7* 29.9* 26.4*  NEUTROABS  --   --   --  22.3* 21.3*  HGB 10.1* 9.9* 9.9* 9.2* 9.4*  HCT 31.7* 31.8* 30.6* 29.5* 30.5*  MCV 96.6 98.5 97.5 99.0 99.7  PLT 318 329 320 253 252   Basic Metabolic Panel: Recent Labs  Lab 02/12/24 0230 02/13/24 0557 02/14/24 0200 02/15/24 0515 02/17/24 0552  NA 139 139 135 136 137  K 4.0 4.4 4.4 4.3 4.2  CL 110 110 107 108 105  CO2 22 23 20* 23 23  GLUCOSE 105* 115* 119* 98 119*  BUN 27* 31* 33* 34* 35*  CREATININE 0.73 0.73 0.75 0.77 0.71  CALCIUM 8.2* 8.0* 7.9* 8.0* 8.2*  MG 2.0 2.2 2.4 2.2 2.1  PHOS  --   --  2.8 2.8 3.2     No results found for this or any previous visit (from the past 240 hours).   Radiology Studies: No results found.  Scheduled Meds:  Chlorhexidine Gluconate Cloth  6 each Topical Daily   dexamethasone (DECADRON) injection  8 mg Intravenous Daily   losartan  100 mg Per Tube q morning   pantoprazole (PROTONIX) IV  40 mg Intravenous Q12H   sodium chloride flush  10-40 mL Intracatheter Q12H   Tbo-Filgrastim   300 mcg Subcutaneous q1800   Continuous Infusions:  heparin 1,200 Units/hr (02/17/24 1118)   TPN ADULT (ION) 70 mL/hr at 02/17/24 0350   TPN ADULT (ION)       LOS: 13 days   Burnadette Pop, MD [facility] P2/20/2025, 11:20 AM

## 2024-02-17 NOTE — Progress Notes (Signed)
  Daily Progress Note   Patient Name: Tracy Preston       Date: 02/17/2024 DOB: Apr 07, 1935  Age: 89 y.o. MRN#: [id number] Attending Physician: Burnadette Pop, MD Primary Care Physician: Marden Noble, MD (Inactive) Admit Date: 02/04/2024 Length of Stay: 13 days  Reason for Consultation/Follow-up: Establishing goals of care  Subjective:   Reviewed EMR prior to presenting to bedside.  NG tube remains in place, much less output, now having bowel movements, doesn't have nausea.  currently sitting in chair.   Vital Signs:  BP 117/67 (BP Location: Left Arm)   Pulse 77   Temp 97.9 F (36.6 C) (Oral)   Resp 18   Ht 5\' 4"  (1.626 m)   Wt 56.4 kg   SpO2 96%   BMI 21.34 kg/m   Physical Exam:  General: NAD,  Has NGT Cardiovascular: RRR, no edema in LE b/l Respiratory: no increased work of breathing noted, not in respiratory distress Skin: no rashes or lesions on visible skin Neuro: A&Ox4, following commands easily Psych: appropriately answers all questions  Imaging: I personally reviewed recent imaging.   Assessment & Plan:   Assessment: Patient is a 89 year old female with past medical history of hypertension, CKD, and family history of breast cancer who was admitted on 02/04/2024 for management of abdominal distention and vomiting for approximately 2 weeks.  Imaging concerning for new metastatic gynecological malignancy with small bowel obstruction.  Imaging also showed likely metastatic disease to liver and peritoneal carcinomatosis.  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  Gyn Onc and oncology consulted for recommendations.  Palliative medicine team consulted to assist with complex medical decision making.   Recommendations/Plan: # Complex medical decision making/goals of care: -Interdisciplinary discussions between medical oncology hospital medicine and GYN oncology took place on 02-15-2024.  Continue NG tube, continue TPN, continue to monitor.                   -  Code Status: Limited: Do not attempt resuscitation (DNR) -DNR-LIMITED -Do Not Intubate/DNI    # Symptom management Patient is receiving these palliative interventions for symptom management with an intent to improve quality of life.                 -Malignant small bowel obstruction secondary to peritoneal carcinomatosis                               -Patient to receive initial inpatient chemotherapy.  Receiving IV dexamethasone for symptom management.  Small bowel further imaging is being considered.     -Patient continuing to receive IV dexamethasone 8 mg daily.  Based on plan for NG tube removal, will consider decreasing dose in upcoming days.  # Psycho-social/Spiritual Support:  - Support System: daughters, son-in-law, 8 grandchildren    # Discharge Planning:  To Be Determined  -Have placed outpatient PMT referral to follow-up at [facility] with hope patient will be able to complete this.  Discussed with: IDT  Thank you for allowing the palliative care team to participate in the care Tracy Preston.  low MDM Tracy Hawking MD Palliative Care Provider PMT # [phone]  If patient remains symptomatic despite maximum doses, please call PMT at [phone] between 0700 and 1900. Outside of these hours, please call attending, as PMT does not have night coverage.

## 2024-02-17 NOTE — Progress Notes (Signed)
 PHARMACY - ANTICOAGULATION CONSULT NOTE  Pharmacy Consult for heparin  Indication: acute pulmonary embolus and DVT  Allergies  Allergen Reactions   Calcium-Containing Compounds Other (See Comments)    Upsets the stomach, so it is not taken very often    Patient Measurements: Height: 5\' 4"  (162.6 cm) Weight: 56.8 kg (125 lb 3.5 oz) IBW/kg (Calculated) : 54.7 Heparin Dosing Weight: total body weight   Vital Signs: Temp: 97.5 F (36.4 C) (02/19 2125) Temp Source: Oral (02/19 2125) BP: 118/60 (02/19 2125) Pulse Rate: 73 (02/19 2125)  Labs: Recent Labs    02/14/24 0200 02/15/24 0515 02/15/24 1500 02/16/24 0138 02/16/24 1340 02/16/24 2343  HGB 9.9*  --   --  9.2*  --   --   HCT 30.6*  --   --  29.5*  --   --   PLT 320  --   --  253  --   --   HEPARINUNFRC 0.45 0.21*   < > 0.23* 0.21* 0.58  CREATININE 0.75 0.77  --   --   --   --    < > = values in this interval not displayed.    Estimated Creatinine Clearance: 43.6 mL/min (by C-G formula based on SCr of 0.77 mg/dL).   Medical History: Past Medical History:  Diagnosis Date   Cancer (HCC)    skin cancer leg -no problems now   GERD (gastroesophageal reflux disease)    mild -no meds   Heart murmur    "faint"   Hypertension     Assessment: 89 y/o F with metastatic ovarian cancer admitted with SBO secondary to peritoneal carcinomatosis. Chest CT on 2/14 showed incidental acute PE in the "the distal right pulmonary artery and lobar and segmental right upper lobe and right middle lobe pulmonary artery branches" with borderline elevated RV/LV ratio.  LE doppler on 02/12/24 positive for acute DVT in the left peroneal veins and left posterior tibial veins. Pharmacy consulted for IV heparin dosing for VTE treatment.  Significant events:  - 2/15: received carboplatin/paclitaxel   Today, 02/17/2024: HL remains subtherapeutic and unchanged despite ~2 unit/kg/hr rate increase to 950 units/hr Hgb low but stable, plts WNL No  bleeding or interruptions noted  2343 HL 0.58 therapeutic on 1150 units/hr Per RN no bleeding noted   Goal of Therapy:  Heparin level 0.3-0.7 units/ml Monitor platelets by anticoagulation protocol: Yes   Plan:  continue heparin drip at 1150 units/hr Repeat heparin level in 8 hours  Daily CBC, heparin level Monitor closely for s/sx of bleeding   Arley Phenix RPh 02/17/2024, 12:13 AM

## 2024-02-17 NOTE — Progress Notes (Signed)
 PHARMACY - ANTICOAGULATION CONSULT NOTE  Pharmacy Consult for heparin  Indication: acute pulmonary embolus and DVT  Allergies  Allergen Reactions   Calcium-Containing Compounds Other (See Comments)    Upsets the stomach, so it is not taken very often    Patient Measurements: Height: 5\' 4"  (162.6 cm) Weight: 56.4 kg (124 lb 5.4 oz) IBW/kg (Calculated) : 54.7 Heparin Dosing Weight: total body weight   Vital Signs: Temp: 97.9 F (36.6 C) (02/20 0451) Temp Source: Oral (02/20 0451) BP: 117/67 (02/20 0451) Pulse Rate: 77 (02/20 0451)  Labs: Recent Labs    02/15/24 0515 02/15/24 1500 02/16/24 0138 02/16/24 1340 02/16/24 2343 02/17/24 0552 02/17/24 0832  HGB  --   --  9.2*  --   --  9.4*  --   HCT  --   --  29.5*  --   --  30.5*  --   PLT  --   --  253  --   --  252  --   HEPARINUNFRC 0.21*   < > 0.23* 0.21* 0.58  --  0.38  CREATININE 0.77  --   --   --   --  0.71  --    < > = values in this interval not displayed.    Estimated Creatinine Clearance: 43.6 mL/min (by C-G formula based on SCr of 0.71 mg/dL).   Medical History: Past Medical History:  Diagnosis Date   Cancer (HCC)    skin cancer leg -no problems now   GERD (gastroesophageal reflux disease)    mild -no meds   Heart murmur    "faint"   Hypertension     Assessment: 89 y/o F with metastatic ovarian cancer admitted with SBO secondary to peritoneal carcinomatosis. Chest CT on 2/14 showed incidental acute PE in the "the distal right pulmonary artery and lobar and segmental right upper lobe and right middle lobe pulmonary artery branches" with borderline elevated RV/LV ratio.  LE doppler on 02/12/24 positive for acute DVT in the left peroneal veins and left posterior tibial veins. Pharmacy consulted for IV heparin dosing for VTE treatment.  Significant events:  - 2/15: received carboplatin/paclitaxel   Today, 02/17/2024: HL remains therapeutic on 1150 units/hr, although substantially lower than previously on  same rate Hgb low but stable; plts stable WNL No bleeding or interruptions noted by RN  Goal of Therapy:  Heparin level 0.3-0.7 units/ml Monitor platelets by anticoagulation protocol: Yes   Plan:  Increase heparin rate slightly to 1200 units/hr No confirmatory level needed given minor adjustment Daily CBC, heparin level Monitor closely for s/sx of bleeding   Bernadene Person, PharmD, BCPS [phone] 02/17/2024, 9:51 AM

## 2024-02-17 NOTE — Progress Notes (Signed)
 Hep level still therapeutic, denies N/V this shift, NGT output 350 this shift, dark green in color, no foul odor, denies pain, ambulating back and forth to bathroom with standby asst, in bed resting, call light in reach   Daughter requested patient be asstd with putting on robe but patient stated she may put it on tomorrow and felt like the silk would bother her while she was in bed as well as havingto get up and down to use the bathroom overnight

## 2024-02-18 ENCOUNTER — Inpatient Hospital Stay (HOSPITAL_COMMUNITY): Payer: Medicare PPO

## 2024-02-18 DIAGNOSIS — C786 Secondary malignant neoplasm of retroperitoneum and peritoneum: Secondary | ICD-10-CM | POA: Diagnosis not present

## 2024-02-18 LAB — CBC WITH DIFFERENTIAL/PLATELET
Abs Immature Granulocytes: 0.58 10*3/uL — ABNORMAL HIGH (ref 0.00–0.07)
Basophils Absolute: 0 10*3/uL (ref 0.0–0.1)
Basophils Relative: 0 %
Eosinophils Absolute: 0.2 10*3/uL (ref 0.0–0.5)
Eosinophils Relative: 1 %
HCT: 30.5 % — ABNORMAL LOW (ref 36.0–46.0)
Hemoglobin: 9.3 g/dL — ABNORMAL LOW (ref 12.0–15.0)
Immature Granulocytes: 3 %
Lymphocytes Relative: 8 %
Lymphs Abs: 1.8 10*3/uL (ref 0.7–4.0)
MCH: 30.8 pg (ref 26.0–34.0)
MCHC: 30.5 g/dL (ref 30.0–36.0)
MCV: 101 fL — ABNORMAL HIGH (ref 80.0–100.0)
Monocytes Absolute: 1.5 10*3/uL — ABNORMAL HIGH (ref 0.1–1.0)
Monocytes Relative: 6 %
Neutro Abs: 19.5 10*3/uL — ABNORMAL HIGH (ref 1.7–7.7)
Neutrophils Relative %: 82 %
Platelets: 254 10*3/uL (ref 150–400)
RBC: 3.02 MIL/uL — ABNORMAL LOW (ref 3.87–5.11)
RDW: 13.2 % (ref 11.5–15.5)
Smear Review: ADEQUATE
WBC: 23.5 10*3/uL — ABNORMAL HIGH (ref 4.0–10.5)
nRBC: 0 % (ref 0.0–0.2)

## 2024-02-18 LAB — HEPARIN LEVEL (UNFRACTIONATED): Heparin Unfractionated: 0.53 [IU]/mL (ref 0.30–0.70)

## 2024-02-18 LAB — GLUCOSE, CAPILLARY
Glucose-Capillary: 108 mg/dL — ABNORMAL HIGH (ref 70–99)
Glucose-Capillary: 121 mg/dL — ABNORMAL HIGH (ref 70–99)
Glucose-Capillary: 136 mg/dL — ABNORMAL HIGH (ref 70–99)

## 2024-02-18 MED ORDER — TRAVASOL 10 % IV SOLN
INTRAVENOUS | Status: AC
  Filled 2024-02-18: qty 890.4

## 2024-02-18 MED ORDER — DIATRIZOATE MEGLUMINE & SODIUM 66-10 % PO SOLN
90.0000 mL | Freq: Once | ORAL | Status: AC
  Administered 2024-02-18: 90 mL via NASOGASTRIC
  Filled 2024-02-18: qty 90

## 2024-02-18 NOTE — Progress Notes (Signed)
 PHARMACY - TOTAL PARENTERAL NUTRITION CONSULT NOTE   Indication: SBO  Assessment: 45 yoF admitted 2/7 after 2 weeks of progressively worsening reflux and abdominal distention with n/v. 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 Plan for bowel rest +/- venting G-tube while patient undergoes chemotherapy to hopefully relieve obstruction. Pharmacy consulted for TPN management.  Glucose / Insulin: no hx DM; CBG goal 100-150 - Negligible SSI use, even with daily dexamethasone on board >> CBGs stopped 2/20  No new labs today - below as of 2/20: Electrolytes: all stable WNL Renal: SCr stable WNL; BUN mildly elevated and trending slowly up Hepatic: mild transaminitis from 2/17 persists - given time course, seems more likely d/t lysis of hepatic mets after chemo, versus TPN - Tbili remains stable WNL - TG WNL 2/17  I/O: Continues to have improving bowel function as noted 2/20 per GynOnc (flatus, small BMs); no n/v >> likely getting SBO protocol imaging today - NG output continues to decrease; down to < 400 ml yesterday - 2 small stool 2/20 - good UOP (1.1 ml/kg/hr) - no mIVF GI Imaging: - 2/7 CT a/p: distal SBO d/t diffuse peritoneal carcinomatosis, with transition point in RLQ, hepatic and retroperitoneal metastases; incidental finding of rectal intussusception - 2/11 AXR: persistent but significantly improved SB dilation when compared with prior - 2/13 AXR: Worsening small bowel obstruction.  - 2/14 MRI pelvis: Abnormal amorphous soft tissue throughout lower uterine segment and cervix with ileocolic and likely ovarian mets. GI Surgeries / Procedures: NA  Central access: PICC TPN start date: 2/11  Nutritional Goals: Goal TPN rate is 70 mL/hr (provides 89 g of protein and 1717 kcals per day)  RD Assessment: Estimated Needs Total Energy Estimated Needs: 1625-1900 kcal Total Protein Estimated Needs: 80-95g Total Fluid Estimated Needs: 1.9L/day  Current Nutrition: NPO, TPN  Plan:  Continue  TPN/Lipids at 70 mL/hr Electrolytes in TPN: no changes from yesterday Na - 80 mEq/L K - 40 mEq/L Ca - 3 mEq/L Mg - 5 mEq/L Phos - 15 mmol/L Cl:Ac ratio - 1:2 Add standard MVI and trace elements to TPN Continue to hold chromium for national backorder mIVF per MD Monitor TPN labs on Mon/Thurs CMP tomorrow  Bernadene Person, PharmD, BCPS [phone] 02/18/2024, 7:15 AM

## 2024-02-18 NOTE — Plan of Care (Signed)

## 2024-02-18 NOTE — Progress Notes (Addendum)
 GYN Onc Progress Note  Subjective: Patient reports having a larger bowel movement this am and smaller bowel movements with flatus through the night. No nausea or emesis reported. No pain reported. Continues to ambulate without difficulty. No side effects related to recent chemo treatment voiced. No concerns voiced.   Objective: Vital signs in last 24 hours: Temp:  [97.8 F (36.6 C)-98.2 F (36.8 C)] 97.8 F (36.6 C) (02/21 0524) Pulse Rate:  [69-73] 70 (02/21 0524) Resp:  [14-15] 14 (02/21 0524) BP: (96-117)/(62-74) 113/62 (02/21 0524) SpO2:  [95 %-100 %] 95 % (02/21 0524) Last BM Date : 02/17/24  Intake/Output from previous day: 02/20 0701 - 02/21 0700 In: 1976.9 [I.V.:1976.9] Out: 1851 [Urine:1500; Emesis/NG output:350; Stool:1]  Physical Examination (performed by Dr. Pricilla Holm): Gen: NAD, alert and oriented;  CV: RRR, no murmurs or rubs Pulm: No rhonchi or wheezing Abd: softer, moderately distended but appears less, hypoactive BS, nontender, NG tube to intermittent wall suction Ext: warm and well perfused, no edema or tenderness to palpation  Labs:    Latest Ref Rng & Units 02/18/2024    5:54 AM 02/17/2024    5:52 AM 02/16/2024    1:38 AM  CBC  WBC 4.0 - 10.5 K/uL 23.5  26.4  29.9   Hemoglobin 12.0 - 15.0 g/dL 9.3  9.4  9.2   Hematocrit 36.0 - 46.0 % 30.5  30.5  29.5   Platelets 150 - 400 K/uL 254  252  253       Latest Ref Rng & Units 02/17/2024    5:52 AM 02/15/2024    5:15 AM 02/14/2024    2:00 AM  BMP  Glucose 70 - 99 mg/dL 865  98  784   BUN 8 - 23 mg/dL 35  34  33   Creatinine 0.44 - 1.00 mg/dL 6.96  2.95  2.84   Sodium 135 - 145 mmol/L 137  136  135   Potassium 3.5 - 5.1 mmol/L 4.2  4.3  4.4   Chloride 98 - 111 mmol/L 105  108  107   CO2 22 - 32 mmol/L 23  23  20    Calcium 8.9 - 10.3 mg/dL 8.2  8.0  7.9    Echocardiogram performed 02/13/2024: EF 65-70%  Doppler from 02/12/24 with +DVT in LLE  Assessment: 89 y.o. with Stage IV gyn malignancy admitted with a  malignant bowel obstruction, s/p dose-reduced carboplatin and taxol on 02/12/2024 managed by Dr. Truett Perna. PE diagnosed on staging CT chest from 02/11/2024-currently on heparin drip.   Metastatic cancer: Based on Dr. 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  She has a pathologically enlarged left axillary lymph node which they had discussed potentially biopsying both for additional tissue diagnosis and next generation sequencing. Based on long discussions with the patient, her family, and Dr. Pricilla Holm and in line with her goals of care, plan to start dose reduced chemotherapy with carboplatin and paclitaxel today in the hopes of symptom improvement.  Malignant bowel obstruction: Plan for small bowel follow through imaging study this am per Dr. Pricilla Holm. Plan to continue bowel rest with NG tube in place, GI prophylaxis, and dexamethasone.  Given anticipated prolonged period requiring n.p.o. status, patient was started on TPN last week.  Dr. Pricilla Holm discussed the possibility of future G-tube placement to allow the patient to go home from the hospital although she would favor continued conservative management in the hope that  chemotherapy may help relieve her malignant obstruction.  She has had increasing GI function.   Pulmonary embolism: Dr. Pricilla Holm previously discussed with the patient hypercoagulable state that cancer causes. Doppler from 02/12/2024 +for DVT in LLE. Recommend no use of SCDs.  Patient started on heparin drip 02/11/24.  Goal would be to transition to DOAC once she is able to take p.o. and ready for discharge.    LOS: 14 days    Doylene Bode 02/18/2024, 7:57 AM

## 2024-02-18 NOTE — Progress Notes (Signed)
  Daily Progress Note   Patient Name: Tracy Preston       Date: 02/18/2024 DOB: 04/12/1935  Age: 89 y.o. MRN#: [id number] Attending Physician: Burnadette Pop, MD Primary Care Physician: Marden Noble, MD (Inactive) Admit Date: 02/04/2024 Length of Stay: 14 days  Reason for Consultation/Follow-up: Establishing goals of care  Subjective:   Resting in bed, daughter and son in law from [place] at bedside, no distress, less output from NGT   Vital Signs:  BP 113/62 (BP Location: Left Arm)   Pulse 70   Temp 97.8 F (36.6 C) (Oral)   Resp 14   Ht 5\' 4"  (1.626 m)   Wt 56.4 kg   SpO2 95%   BMI 21.34 kg/m   Physical Exam:  General: NAD,  Has NGT Cardiovascular: RRR, no edema in LE b/l Respiratory: no increased work of breathing noted, not in respiratory distress Skin: no rashes or lesions on visible skin Neuro: A&Ox4, following commands easily Psych: appropriately answers all questions  Imaging: I personally reviewed recent imaging.   Assessment & Plan:   Assessment: Patient is a 89 year old female with past medical history of hypertension, CKD, and family history of breast cancer who was admitted on 02/04/2024 for management of abdominal distention and vomiting for approximately 2 weeks.  Imaging concerning for new metastatic gynecological malignancy with small bowel obstruction.  Imaging also showed likely metastatic disease to liver and peritoneal carcinomatosis.  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  Gyn Onc and oncology consulted for recommendations.  Palliative medicine team consulted to assist with complex medical decision making.   Recommendations/Plan: # Complex medical decision making/goals of care: -Interdisciplinary discussions between medical oncology hospital medicine and GYN oncology took place on 02-15-2024.  Continue NG tube, continue TPN, continue to monitor.                  -  Code Status: Limited: Do not attempt resuscitation  (DNR) -DNR-LIMITED -Do Not Intubate/DNI    # Symptom management Patient is receiving these palliative interventions for symptom management with an intent to improve quality of life.                 -Malignant small bowel obstruction secondary to peritoneal carcinomatosis                               -Patient to receive initial inpatient chemotherapy.  Receiving IV dexamethasone for symptom management.  Small bowel further imaging is being considered for today.     -Patient continuing to receive IV dexamethasone 8 mg daily.  Based on plan for NG tube removal, will consider decreasing dose in upcoming days.  # Psycho-social/Spiritual Support:  - Support System: daughters, son-in-law, 8 grandchildren    # Discharge Planning:  To Be Determined  -Have placed outpatient PMT referral to follow-up at [facility] with hope patient will be able to complete this.  Discussed with:patient daughter son in law.   Thank you for allowing the palliative care team to participate in the care Tracy Preston.  mod MDM Rosalin Hawking MD Palliative Care Provider PMT # [phone]  If patient remains symptomatic despite maximum doses, please call PMT at [phone] between 0700 and 1900. Outside of these hours, please call attending, as PMT does not have night coverage.

## 2024-02-18 NOTE — Progress Notes (Signed)
 NO acute changes this shift, patient received Gastrografin today, no ASE noted at this time, patient continued Heparin therapy without ASE, BP WNL this shift, cont with TPN, labs scheduled in am to trend triglycerides, in bed resting, call light in reach

## 2024-02-18 NOTE — Progress Notes (Signed)
 PROGRESS NOTE  Tracy Preston  ZOX:[id number] DOB: [birth year] DOA: 02/04/2024 PCP: Marden Noble, MD (Inactive)   Brief Narrative: Patient is a 89 year old female with history of hypertension, breast cancer who presented with abdominal distention, vomiting for 2 weeks.  CT abdomen was concerning for new metastatic disease from possible GYN origin, SBO.  Further workup showed peritoneal carcinomatosis with likely GYN malignancy.  GYN oncology, general surgery consulted pelvic MRI showed advanced GYN malignancy.  CT chest with contrast showed distal pulmonary present with cor pulmonale, started on heparin drip.  Received first cycle of chemo on 2/15.  Hospital course remarkable for persistent bowel obstruction, started on TPN, Decadron.  Palliative care following.  Now having bowel movements.  Plan is to do small bowel follow-through  Assessment & Plan:  Principal Problem:   Carcinomatosis peritonei causing obstruction Active Problems:   SBO (small bowel obstruction) (HCC)   GERD (gastroesophageal reflux disease)   Essential hypertension   Peritoneal carcinomatosis (HCC)   Anemia   Malnutrition of moderate degree   Palliative care encounter   Adenocarcinoma [facility])   Malignant neoplasm metastatic to liver (HCC)   DNR (do not resuscitate)   Goals of care, counseling/discussion   Need for emotional support   Counseling and coordination of care  Malignant small bowel obstruction secondary to peritoneal carcinomatosis/ovarian adenocarcinoma with metastasis: Presented with abdominal distention, vomiting.GYN oncology, general surgery consulted.The  pelvic MRI showed advanced GYN malignancy.  CT chest with contrast showed distal PE, started on heparin drip.  Received first cycle of chemo on 2/15.  Hospital course remarkable for persistent bowel obstruction, started on TPN, Decadron. 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  Patient  now having bowel movements.  Abdomen is soft and slightly distended.  Bowel sound present Plan is to do a abdominal x-ray today with SBO protocol May need octreotide, venting G-tube at some point if continues to have bowel obstruction.  Right-sided distal PE: Started on heparin drip.  Lower EXTR Doppler positive for DVT.  Eventual plan is to transition to Eliquis.  Echo showed preserved EF  Leukocytosis: She received Granix by oncology. Improving.   No fever.  Poor nutritional status/severe protein calorie malnutrition: On TPN  Hypertension: On losartan.  Continue as needed medications for severe hypertension  GERD: Continue PPI  Normocytic anemia: Currently hemoglobin stable  Goals of care:Palliative care was following for goals of care .CODE STATUS DNR   Nutrition Problem: Moderate Malnutrition Etiology: acute illness    DVT prophylaxis:iv heparin     Code Status: Limited: Do not attempt resuscitation (DNR) -DNR-LIMITED -Do Not Intubate/DNI   Family Communication: Called and discussed with son-in-law Dr. Venetia Maxon on phone on 2/20 Discussed with daughter on phone at bedside on 2/21  Patient status:Inpatient  Patient is from :Home  Anticipated discharge WJ:XBJY  Estimated DC date:After oncology clearance   Consultants: General Surgery, palliative care, oncology  Procedures: None yet  Antimicrobials:  Anti-infectives (From admission, onward)    None       Subjective: Patient seen and examined at bedside today.  Hemodynamically stable.  Comfortable.  She again had a bowel movement this morning.  Denies abdomen, nausea or vomiting.  Objective: Vitals:   02/17/24 0716 02/17/24 1526 02/17/24 2011 02/18/24 0524  BP:  96/74 117/68 113/62  Pulse:  73 69 70  Resp:  15  14  Temp:  97.9 F (36.6 C) 98.2 F (36.8 C) 97.8 F (36.6 C)  TempSrc:  Oral Oral  Oral  SpO2:  100% 96% 95%  Weight: 56.4 kg     Height:        Intake/Output Summary (Last 24 hours) at  02/18/2024 1127 Last data filed at 02/18/2024 1048 Gross per 24 hour  Intake 1604.29 ml  Output 1551 ml  Net 53.29 ml   Filed Weights   02/11/24 0900 02/16/24 0700 02/17/24 0716  Weight: 53.9 kg 56.8 kg 56.4 kg    Examination:    General exam: Overall comfortable, not in distress, pleasant elderly female HEENT: PERRL, NG tube Respiratory system:  no wheezes or crackles  Cardiovascular system: S1 & S2 heard, RRR.  Gastrointestinal system: Abdomen is mildly distended, soft and nontender.  Bowel sounds present Central nervous system: Alert and oriented Extremities: No edema, no clubbing ,no cyanosis Skin: No rashes, no ulcers,no icterus     Data Reviewed: I have personally reviewed following labs and imaging studies  CBC: Recent Labs  Lab 02/13/24 0557 02/14/24 0200 02/16/24 0138 02/17/24 0552 02/18/24 0554  WBC 10.3 35.7* 29.9* 26.4* 23.5*  NEUTROABS  --   --  22.3* 21.3* 19.5*  HGB 9.9* 9.9* 9.2* 9.4* 9.3*  HCT 31.8* 30.6* 29.5* 30.5* 30.5*  MCV 98.5 97.5 99.0 99.7 101.0*  PLT 329 320 253 252 254   Basic Metabolic Panel: Recent Labs  Lab 02/12/24 0230 02/13/24 0557 02/14/24 0200 02/15/24 0515 02/17/24 0552  NA 139 139 135 136 137  K 4.0 4.4 4.4 4.3 4.2  CL 110 110 107 108 105  CO2 22 23 20* 23 23  GLUCOSE 105* 115* 119* 98 119*  BUN 27* 31* 33* 34* 35*  CREATININE 0.73 0.73 0.75 0.77 0.71  CALCIUM 8.2* 8.0* 7.9* 8.0* 8.2*  MG 2.0 2.2 2.4 2.2 2.1  PHOS  --   --  2.8 2.8 3.2     No results found for this or any previous visit (from the past 240 hours).   Radiology Studies: No results found.  Scheduled Meds:  Chlorhexidine Gluconate Cloth  6 each Topical Daily   dexamethasone (DECADRON) injection  8 mg Intravenous Daily   losartan  100 mg Per Tube q morning   pantoprazole (PROTONIX) IV  40 mg Intravenous Q12H   sodium chloride flush  10-40 mL Intracatheter Q12H   Continuous Infusions:  heparin 1,200 Units/hr (02/18/24 0401)   TPN ADULT (ION) 70  mL/hr at 02/18/24 0401   TPN ADULT (ION)       LOS: 14 days   Burnadette Pop, MD [facility] P2/21/2025, 11:27 AM

## 2024-02-18 NOTE — Plan of Care (Signed)

## 2024-02-18 NOTE — Progress Notes (Signed)
 PHARMACY - ANTICOAGULATION CONSULT NOTE  Pharmacy Consult for heparin  Indication: acute pulmonary embolus and DVT  Allergies  Allergen Reactions   Calcium-Containing Compounds Other (See Comments)    Upsets the stomach, so it is not taken very often    Patient Measurements: Height: 5\' 4"  (162.6 cm) Weight: 56.4 kg (124 lb 5.4 oz) IBW/kg (Calculated) : 54.7 Heparin Dosing Weight: total body weight   Vital Signs: Temp: 97.8 F (36.6 C) (02/21 0524) Temp Source: Oral (02/21 0524) BP: 113/62 (02/21 0524) Pulse Rate: 70 (02/21 0524)  Labs: Recent Labs    02/16/24 0138 02/16/24 1340 02/16/24 2343 02/17/24 0552 02/17/24 0832 02/18/24 0554  HGB 9.2*  --   --  9.4*  --  9.3*  HCT 29.5*  --   --  30.5*  --  30.5*  PLT 253  --   --  252  --  254  HEPARINUNFRC 0.23*   < > 0.58  --  0.38 0.53  CREATININE  --   --   --  0.71  --   --    < > = values in this interval not displayed.    Estimated Creatinine Clearance: 43.6 mL/min (by C-G formula based on SCr of 0.71 mg/dL).  Assessment: 89 y/o F with metastatic ovarian cancer admitted with SBO secondary to peritoneal carcinomatosis. Chest CT on 2/14 showed incidental acute PE in the "the distal right pulmonary artery and lobar and segmental right upper lobe and right middle lobe pulmonary artery branches" with borderline elevated RV/LV ratio.  LE doppler on 02/12/24 positive for acute DVT in the left peroneal veins and left posterior tibial veins. Pharmacy consulted for IV heparin dosing for VTE treatment.  Significant events:  - 2/15: received carboplatin/paclitaxel   Today, 02/18/2024: HL remains therapeutic on 1200 units/hr Hgb low but stable; plts stable WNL No bleeding or interruptions noted by RN  Goal of Therapy:  Heparin level 0.3-0.7 units/ml Monitor platelets by anticoagulation protocol: Yes   Plan:  Continue heparin infusion at 1200 units/hr Daily CBC, heparin level Monitor closely for s/sx of bleeding F/u plans  for long-term anticoag (copay for both DOACs is $30/month)  Bernadene Person, PharmD, BCPS [phone] 02/18/2024, 10:42 AM

## 2024-02-18 NOTE — Progress Notes (Addendum)
 Tracy Preston   DOB:01/03/1935   ZO#:[id number]      ASSESSMENT & PLAN:  1.  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 - Newly diagnosed February 2025 - CT scan 02/04/2024 shows diffuse peritoneal carcinomatosis and mild ascites. - Pelvic ultrasound 02/05/2024 shows 2 cm heterogeneous area at the uterine fundus, endometrial malignancy not excluded. - S/p paracentesis 02/05/2024, 100 mL of yellow fluid removed. 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 -MRI pelvis and CT chest for staging done on 02/11/2024.  Shows abnormal soft tissue throughout the lower uterine and cervix could represent primary uterine/cervical carcinoma or mets. - Chemotherapy with carboplatin and Taxol initiated on 02/12/2024.  Tolerated well. - On Granix 300 mcg daily x 5, completed therapy 02/17/2024.  WBC remains somewhat elevated, likely due to Granix.   - GYN Onc following closely.  Consideration for starting octreotide. - Palliative team also following - Medical oncology/Dr. Truett Perna following closely.   2.  Small bowel obstruction - Remains with persistent SBO  - Distal SBO with transition point in the right lower quadrant  - Likely due to peritoneal carcinomatosis - Patient reports 2 small bowel movements overnight. - NGT remains intact to wall suction - TPN infusing well - Management per surgery   3.  Acute pulmonary embolism - Per CT of the chest for staging purposes done on 02/11/2024.  This showed incidental acute PE involving the distal right pulmonary artery and lobar and segmental RUL and RML pulmonary artery branches.  Small right pleural effusion. - On IV heparin, continue per protocol - Monitor closely for active bleeding   4.  Anemia, normocytic - HGB stable 9.3 with MCV 101 today    - Transfuse PRBC for hemoglobin <7.0.  No transfusional  intervention warranted at this time. -Monitor CBC with differential every 2 days   5.  Hypertension - On losartan at home -BP stable today - Monitor BP counts - Primary team following      Code Status DNR-limited  Subjective:  Patient seen awake and alert sitting up in bed.  NG tube remains intact and draining.  TPN infusing well.  IV heparin infusing well.  Reports she feels fine and had 2 small bowel movements overnight.  She is very pleasant and reports family is visiting from [place].  Objective:  Vitals:   02/17/24 2011 02/18/24 0524  BP: 117/68 113/62  Pulse: 69 70  Resp:  14  Temp: 98.2 F (36.8 C) 97.8 F (36.6 C)  SpO2: 96% 95%     Intake/Output Summary (Last 24 hours) at 02/18/2024 9811 Last data filed at 02/18/2024 9147 Gross per 24 hour  Intake 1627.3 ml  Output 1551 ml  Net 76.3 ml     REVIEW OF SYSTEMS:   Constitutional: + Fatigue, denies fevers, chills or abnormal night sweats Eyes: Denies blurriness of vision, double vision or watery eyes Ears, nose, mouth, throat, and face: Denies mucositis or sore throat Respiratory: Denies cough, dyspnea or wheezes Cardiovascular: Denies palpitation, chest discomfort or lower extremity swelling Gastrointestinal: + Abdominal distention Skin: Denies abnormal skin rashes Lymphatics: Denies new lymphadenopathy or easy bruising Neurological: Denies numbness, tingling or new weaknesses Behavioral/Psych: Mood is stable, no new changes  All other systems were reviewed with the patient and are negative.  PHYSICAL EXAMINATION: ECOG PERFORMANCE STATUS: 2 - Symptomatic, <50% confined to bed  Vitals:   02/17/24 2011 02/18/24 0524  BP:  117/68 113/62  Pulse: 69 70  Resp:  14  Temp: 98.2 F (36.8 C) 97.8 F (36.6 C)  SpO2: 96% 95%   Filed Weights   02/11/24 0900 02/16/24 0700 02/17/24 0716  Weight: 118 lb 14.4 oz (53.9 kg) 125 lb 3.5 oz (56.8 kg) 124 lb 5.4 oz (56.4 kg)    GENERAL: alert, + NG tube  intact SKIN: + Pale skin color, texture, turgor are normal, no rashes or significant lesions EYES: normal, conjunctiva are pink and non-injected, sclera clear OROPHARYNX: no exudate, no erythema and lips, buccal mucosa, and tongue normal  NECK: supple, thyroid normal size, non-tender, without nodularity LYMPH: no palpable lymphadenopathy in the cervical, axillary or inguinal LUNGS: clear to auscultation and percussion with normal breathing effort HEART: regular rate & rhythm and no murmurs and no lower extremity edema ABDOMEN: + Abdominal distention MUSCULOSKELETAL: no cyanosis of digits and no clubbing  PSYCH: alert & oriented x 3 with fluent speech NEURO: no focal motor/sensory deficits   All questions were answered. The patient knows to call the clinic with any problems, questions or concerns.   The total time spent in the appointment was 40 minutes encounter with patient including review of chart and various tests results, discussions about plan of care and coordination of care plan  Dawson Bills, NP 02/18/2024 9:23 AM    Labs Reviewed:  Lab Results  Component Value Date   WBC 23.5 (H) 02/18/2024   HGB 9.3 (L) 02/18/2024   HCT 30.5 (L) 02/18/2024   MCV 101.0 (H) 02/18/2024   PLT 254 02/18/2024   Recent Labs    02/14/24 0200 02/15/24 0515 02/17/24 0552  NA 135 136 137  K 4.4 4.3 4.2  CL 107 108 105  CO2 20* 23 23  GLUCOSE 119* 98 119*  BUN 33* 34* 35*  CREATININE 0.75 0.77 0.71  CALCIUM 7.9* 8.0* 8.2*  GFRNONAA >60 >60 >60  PROT 5.5* 5.4* 5.3*  ALBUMIN 2.4* 2.3* 2.5*  AST 68* 59* 62*  ALT 64* 67* 110*  ALKPHOS 143* 140* 184*  BILITOT 0.4 <0.2 0.4    Studies Reviewed:  ECHOCARDIOGRAM COMPLETE Result Date: 02/13/2024    ECHOCARDIOGRAM REPORT   Patient Name:   SHAMAR KRACKE Date of Exam: 02/13/2024 Medical Rec #:  [id number]          Height:       64.0 in Accession #:    [id number]         Weight:       118.9 lb Date of Birth:  1935/05/28          BSA:           1.568 m Patient Age:    89 years           BP:           142/72 mmHg Patient Gender: F                  HR:           71 bpm. Exam Location:  Inpatient Procedure: 2D Echo, Cardiac Doppler and Color Doppler (Both Spectral and Color            Flow Doppler were utilized during procedure). Indications:    Cardiomyopathy- Unspecified I42.9  History:        Patient has no prior history of Echocardiogram examinations.                 Risk Factors:Hypertension.  Sonographer:    Lucendia Herrlich RCS Referring Phys: [id number] Doreen Salvage AMIN IMPRESSIONS  1. Left ventricular ejection fraction, by estimation, is 65 to 70%. The left ventricle has normal function. The left ventricle has no regional wall motion abnormalities. Left ventricular diastolic parameters were normal.  2. Right ventricular systolic function is normal. The right ventricular size is normal. There is normal pulmonary artery systolic pressure. The estimated right ventricular systolic pressure is 23.8 mmHg.  3. The mitral valve is grossly normal. No evidence of mitral valve regurgitation. No evidence of mitral stenosis.  4. The aortic valve is calcified. Aortic valve regurgitation is trivial. Aortic valve sclerosis/calcification is present, without any evidence of aortic stenosis.  5. The inferior vena cava is normal in size with greater than 50% respiratory variability, suggesting right atrial pressure of 3 mmHg. FINDINGS  Left Ventricle: Left ventricular ejection fraction, by estimation, is 65 to 70%. The left ventricle has normal function. The left ventricle has no regional wall motion abnormalities. Strain imaging was not performed. The left ventricular internal cavity  size was normal in size. There is no left ventricular hypertrophy. Left ventricular diastolic parameters were normal. Right Ventricle: The right ventricular size is normal. No increase in right ventricular wall thickness. Right ventricular systolic function is normal. There is normal pulmonary  artery systolic pressure. The tricuspid regurgitant velocity is 2.28 m/s, and  with an assumed right atrial pressure of 3 mmHg, the estimated right ventricular systolic pressure is 23.8 mmHg. Left Atrium: Left atrial size was normal in size. Right Atrium: Right atrial size was normal in size. Pericardium: There is no evidence of pericardial effusion. Mitral Valve: The mitral valve is grossly normal. No evidence of mitral valve regurgitation. No evidence of mitral valve stenosis. Tricuspid Valve: The tricuspid valve is grossly normal. Tricuspid valve regurgitation is trivial. No evidence of tricuspid stenosis. Aortic Valve: The aortic valve is calcified. Aortic valve regurgitation is trivial. Aortic regurgitation PHT measures 299 msec. Aortic valve sclerosis/calcification is present, without any evidence of aortic stenosis. Aortic valve mean gradient measures 9.0 mmHg. Aortic valve peak gradient measures 11.1 mmHg. Aortic valve area, by VTI measures 1.23 cm. Pulmonic Valve: The pulmonic valve was grossly normal. Pulmonic valve regurgitation is trivial. No evidence of pulmonic stenosis. Aorta: The aortic root and ascending aorta are structurally normal, with no evidence of dilitation. Venous: The inferior vena cava is normal in size with greater than 50% respiratory variability, suggesting right atrial pressure of 3 mmHg. IAS/Shunts: The atrial septum is grossly normal. Additional Comments: 3D imaging was not performed. Mild ascites is present.  LEFT VENTRICLE PLAX 2D LVIDd:         4.00 cm   Diastology LVIDs:         2.60 cm   LV e' medial:    9.25 cm/s LV PW:         0.90 cm   LV E/e' medial:  6.4 LV IVS:        1.00 cm   LV e' lateral:   12.20 cm/s LVOT diam:     1.80 cm   LV E/e' lateral: 4.9 LV SV:         48 LV SV Index:   31 LVOT Area:     2.54 cm  RIGHT VENTRICLE             IVC RV S prime:     11.00 cm/s  IVC diam: 1.20 cm TAPSE (M-mode): 1.8 cm LEFT ATRIUM  Index        RIGHT ATRIUM            Index LA diam:        3.70 cm 2.36 cm/m   RA Area:     12.50 cm LA Vol (A2C):   29.1 ml 18.55 ml/m  RA Volume:   29.10 ml  18.55 ml/m LA Vol (A4C):   40.0 ml 25.50 ml/m LA Biplane Vol: 34.3 ml 21.87 ml/m  AORTIC VALVE                     PULMONIC VALVE AV Area (Vmax):    1.61 cm      PR End Diast Vel: 2.12 msec AV Area (Vmean):   1.25 cm AV Area (VTI):     1.23 cm AV Vmax:           166.67 cm/s AV Vmean:          138.000 cm/s AV VTI:            0.392 m AV Peak Grad:      11.1 mmHg AV Mean Grad:      9.0 mmHg LVOT Vmax:         105.50 cm/s LVOT Vmean:        67.950 cm/s LVOT VTI:          0.190 m LVOT/AV VTI ratio: 0.48 AI PHT:            299 msec  AORTA Ao Root diam: 3.10 cm Ao Asc diam:  2.80 cm MITRAL VALVE               TRICUSPID VALVE MV Area (PHT): 3.27 cm    TR Peak grad:   20.8 mmHg MV Decel Time: 232 msec    TR Vmax:        228.00 cm/s MV E velocity: 59.60 cm/s MV A velocity: 76.60 cm/s  SHUNTS MV E/A ratio:  0.78        Systemic VTI:  0.19 m                            Systemic Diam: 1.80 cm Lennie Odor MD Electronically signed by Lennie Odor MD Signature Date/Time: 02/13/2024/8:53:15 PM    Final    VAS [place] LOWER EXTREMITY VENOUS (DVT) Result Date: 02/12/2024  Lower Venous DVT Study Patient Name:  AZULA ZAPPIA  Date of Exam:   02/12/2024 Medical Rec #: [id number]           Accession #:    [id number] Date of Birth: 04-Nov-1935           Patient Gender: F Patient Age:   85 years Exam Location:  [facility] Procedure:      VAS [place] LOWER EXTREMITY VENOUS (DVT) Referring Phys: Stephania Fragmin --------------------------------------------------------------------------------  Indications: Pulmonary embolism.  Risk Factors: Newly diagnosed peritoneal carcinomatosis. Comparison Study: No previous exams Performing Technologist: Jody Hill RVT, RDMS  Examination Guidelines: A complete evaluation includes B-mode imaging, spectral Doppler, color Doppler, and power Doppler as needed of all accessible  portions of each vessel. Bilateral testing is considered an integral part of a complete examination. Limited examinations for reoccurring indications may be performed as noted. The reflux portion of the exam is performed with the patient in reverse Trendelenburg.  +---------+---------------+---------+-----------+----------+--------------+ RIGHT    CompressibilityPhasicitySpontaneityPropertiesThrombus Aging +---------+---------------+---------+-----------+----------+--------------+ CFV      Full  Yes      No                                  +---------+---------------+---------+-----------+----------+--------------+ SFJ      Full                                                        +---------+---------------+---------+-----------+----------+--------------+ FV Prox  Full           Yes      Yes                                 +---------+---------------+---------+-----------+----------+--------------+ FV Mid   Full           Yes      Yes                                 +---------+---------------+---------+-----------+----------+--------------+ FV DistalFull           Yes      Yes                                 +---------+---------------+---------+-----------+----------+--------------+ PFV      Full                                                        +---------+---------------+---------+-----------+----------+--------------+ POP      Full           Yes      Yes                                 +---------+---------------+---------+-----------+----------+--------------+ PTV      Full                                                        +---------+---------------+---------+-----------+----------+--------------+ PERO     Full                                                        +---------+---------------+---------+-----------+----------+--------------+   +---------+---------------+---------+-----------+----------+--------------+ LEFT      CompressibilityPhasicitySpontaneityPropertiesThrombus Aging +---------+---------------+---------+-----------+----------+--------------+ CFV      Full           Yes      Yes                                 +---------+---------------+---------+-----------+----------+--------------+ SFJ      Full                                                        +---------+---------------+---------+-----------+----------+--------------+  FV Prox  Full           Yes      Yes                                 +---------+---------------+---------+-----------+----------+--------------+ FV Mid   Full           Yes      Yes                                 +---------+---------------+---------+-----------+----------+--------------+ FV DistalFull           Yes      Yes                                 +---------+---------------+---------+-----------+----------+--------------+ PFV      Full                                                        +---------+---------------+---------+-----------+----------+--------------+ POP      Full           Yes      Yes                                 +---------+---------------+---------+-----------+----------+--------------+ PTV      None           No       No                   Acute          +---------+---------------+---------+-----------+----------+--------------+ PERO     None           No       No                   Acute          +---------+---------------+---------+-----------+----------+--------------+     Summary: BILATERAL: -No evidence of popliteal cyst, bilaterally. RIGHT: - There is no evidence of deep vein thrombosis in the lower extremity.  LEFT: - Findings consistent with acute deep vein thrombosis involving the left peroneal veins, and left posterior tibial veins.   *See table(s) above for measurements and observations. Electronically signed by Carolynn Sayers on 02/12/2024 at 8:18:12 PM.    Final    CT CHEST W  CONTRAST Addendum Date: 02/11/2024 ADDENDUM REPORT: 02/11/2024 15:54 ADDENDUM: Critical Value/emergent results were called by telephone at the time of interpretation on 02/11/2024 at 3:54 pm to provider Eugene Garnet, MD, who verbally acknowledged these results. Electronically Signed   By: Delbert Phenix M.D.   On: 02/11/2024 15:54   Result Date: 02/11/2024 CLINICAL DATA:  Peritoneal carcinomatosis. Chest staging. * Tracking Code: BO * EXAM: CT CHEST WITH CONTRAST TECHNIQUE: Multidetector CT imaging of the chest was performed during intravenous contrast administration. RADIATION DOSE REDUCTION: This exam was performed according to the departmental dose-optimization program which includes automated exposure control, adjustment of the mA and/or kV according to patient size and/or use of iterative reconstruction technique. CONTRAST:  75mL OMNIPAQUE IOHEXOL 300 MG/ML  SOLN COMPARISON:  02/04/2024 CT abdomen/pelvis. 02/08/2024 chest radiograph.  FINDINGS: Cardiovascular: Normal heart size. No significant pericardial effusion/thickening. Left anterior descending coronary atherosclerosis. Right PICC terminates in the middle third of the SVC. Atherosclerotic nonaneurysmal thoracic aorta. Incidental acute pulmonary embolism involving the distal right pulmonary artery and lobar and segmental right upper lobe and right middle lobe pulmonary artery branches. No saddle embolus. Normal caliber main pulmonary artery. RV/LV ratio 1.0. Mediastinum/Nodes: No significant thyroid nodules. Fluid level in the mid to lower thoracic esophagus with enteric tube terminating in the proximal stomach. No right axillary adenopathy. Mild left axillary adenopathy up to 1.1 cm (series 2/image 61). No pathologically enlarged mediastinal or hilar nodes. Lungs/Pleura: No pneumothorax. Small layering right pleural effusion. No left pleural effusion. Calcified 1 cm posterior right lower lobe granuloma. Mild dependent bilateral lower lobe atelectasis.  No acute consolidative airspace disease, lung masses or significant pulmonary nodules. Upper abdomen: Moderate upper abdominal ascites with nodular soft tissue caking in the visualized anterior left upper peritoneum, as seen on recent CT abdomen study. Several hypodense liver lesions scattered throughout the visualized liver, unchanged, largest 1.6 cm in the inferior liver on series 2/image 155. Musculoskeletal: No aggressive appearing focal osseous lesions. Mild thoracic spondylosis. IMPRESSION: 1. Incidental acute pulmonary embolism involving the distal right pulmonary artery and lobar and segmental right upper lobe and right middle lobe pulmonary artery branches. No saddle embolus. Normal caliber main pulmonary artery. Borderline elevated RV/LV Ratio = 1.0. 2. Small layering right pleural effusion. Mild dependent bilateral lower lobe atelectasis. 3. Mild left axillary adenopathy, cannot exclude metastatic disease. No other evidence of metastatic disease in the chest. 4. One vessel coronary atherosclerosis. 5. Moderate upper abdominal ascites with nodular soft tissue caking in the visualized anterior left upper peritoneum, as seen on recent CT abdomen study, compatible with peritoneal carcinomatosis. Several hypodense liver lesions scattered throughout the visualized liver, unchanged, suspicious for liver metastases. Electronically Signed: By: Delbert Phenix M.D. On: 02/11/2024 15:28   MR PELVIS W WO CONTRAST Result Date: 02/11/2024 CLINICAL DATA:  "Occult malignancy". Peritoneal carcinomatosis on abdominopelvic CT with bowel obstruction. EXAM: MRI PELVIS WITHOUT AND WITH CONTRAST TECHNIQUE: Multiplanar multisequence MR imaging of the pelvis was performed both before and after administration of intravenous contrast. CONTRAST:  5mL GADAVIST GADOBUTROL 1 MMOL/ML IV SOLN COMPARISON:  CT of 02/04/2024 FINDINGS: Urinary Tract: Primarily decompressed urinary bladder. Left renal sinus cysts without hydronephrosis.  Bowel: Fluid-filled small bowel loops within the upper and mid pelvis measure up to 2.0 cm on 19/17. Improved from 3.4 cm on the prior exam. Again identified is an intussusception of the sigmoid into the rectum. There is mild hyperenhancement within the lead point including on 41/17, without restricted diffusion in this area. Vascular/Lymphatic: Ileocolic mesenteric adenopathy including at 2.2 x 1.8 cm on 08/17. No pelvic sidewall adenopathy. Reproductive: The endometrium is thickened for age including at 1.0 cm on 18/4. This is followed to the level of the cervix and lower uterine segment, where soft tissue fullness is again identified including on 18/4 and 21/3. Correlate restricted diffusion on 22/11. Example 3.7 x 3.7 cm on 31/17. There is amorphous soft tissue fullness within both adnexa, greater right than left. Example restricted diffusion on 17/11. The right adnexal soft tissue fullness measures 3.3 x 3.3 cm on 18/17. No separate ovarian tissue identified. Other: Peritoneal carcinomatosis, as evidenced by diffuse peritoneal enhancement and restricted diffusion (example 22/11). Small volume pelvic fluid. Musculoskeletal: No acute osseous abnormality. IMPRESSION: 1. Abnormal amorphous soft tissue throughout lower uterine segment and cervix could represent primary uterine/cervical carcinoma  or metastatic disease. 2. Diffuse peritoneal carcinomatosis as better imaged on dedicated CT. Ileocolic mesenteric nodal metastasis. 3. Right greater than left adnexal soft tissue fullness likely represents ovarian metastasis. 4. Chronic or recurrent sigmoid/rectal intussusception since 02/04/2024. Vague hyperenhancement in the region of the lead point, without dominant mass. Recommend physical exam correlation to exclude either primary rectosigmoid carcinoma or serosal sigmoid implant lead point. 5. Small volume pelvic fluid. 6. Improved small bowel dilatation since prior CT. Electronically Signed   By: Jeronimo Greaves M.D.    On: 02/11/2024 15:42   DG Abd 1 View Result Date: 02/10/2024 CLINICAL DATA:  [id number] Abdominal distention [id number] SBO (small bowel obstruction) (HCC) [id number] EXAM: ABDOMEN - 1 VIEW COMPARISON:  02/08/2024 FINDINGS: NG tube terminates within the proximal stomach. Worsening small bowel dilation in the mid abdomen measuring up to 5.3 cm (previously 3.3 cm. Paucity of bowel gas within the colon. No gross free intraperitoneal air. IMPRESSION: 1. Worsening small bowel obstruction. 2. NG tube terminates within the proximal stomach. Recommend advancement. Electronically Signed   By: Duanne Guess D.O.   On: 02/10/2024 09:35   DG Chest 1 View Result Date: 02/08/2024 CLINICAL DATA:  PICC line placement EXAM: CHEST  1 VIEW COMPARISON:  02/05/2024 FINDINGS: Right PICC line in place with the tip in the SVC. NG tube is in the stomach. Heart and mediastinal contours within normal limits. No confluent opacities or effusions. No acute bony abnormality. IMPRESSION: Right PICC line tip in the SVC. No active cardiopulmonary disease. Electronically Signed   By: Charlett Nose M.D.   On: 02/08/2024 17:51   [place] EKG SITE RITE Result Date: 02/08/2024 If Site Rite image not attached, placement could not be confirmed due to current cardiac rhythm.  DG Abd Portable 1V Result Date: 02/08/2024 CLINICAL DATA:  Check gastric catheter placement EXAM: PORTABLE ABDOMEN - 1 VIEW COMPARISON:  02/04/2024 FINDINGS: Scattered large and small bowel gas is noted. Mild small bowel dilatation is noted significantly improved when compared with the prior exam. Gastric catheter is noted with the tip in the stomach. No free air is noted. IMPRESSION: Persistent but significantly improved small bowel dilatation when compared with the prior exam. Electronically Signed   By: Alcide Clever M.D.   On: 02/08/2024 09:34   US Paracentesis Result Date: 02/05/2024 INDICATION: 89 year old with new abdominal distention, ascites. Request made for diagnostic and  therapeutic paracentesis. EXAM: ULTRASOUND GUIDED DIAGNOSTIC  PARACENTESIS MEDICATIONS: 10 mL 1% lidocaine. COMPLICATIONS: None immediate. PROCEDURE: Informed written consent was obtained from the patient after a discussion of the risks, benefits and alternatives to treatment. A timeout was performed prior to the initiation of the procedure. Initial ultrasound scanning demonstrates a small amount of ascites within the left lateral abdomen interlooped with bowel. The left lateral abdomen was prepped and draped in the usual sterile fashion. 1% lidocaine was used for local anesthesia. Following this, a 19 gauge, 7-cm, Yueh catheter was introduced. An ultrasound image was saved for documentation purposes. The paracentesis was performed. The catheter was removed and a dressing was applied. The patient tolerated the procedure well without immediate post procedural complication. FINDINGS: A total of approximately 100 mL of yellow fluid was removed. Samples were sent to the laboratory as requested by the clinical team. IMPRESSION: Successful ultrasound-guided paracentesis yielding 100 mL of peritoneal fluid. Performed by: Loyce Dys PA-C Electronically Signed   By: Malachy Moan M.D.   On: 02/05/2024 14:31   US PELVIC COMPLETE WITH TRANSVAGINAL Result Date: 02/05/2024  CLINICAL DATA:  89 year old female with ascites, peritoneal carcinomatosis on CT Abdomen and Pelvis yesterday. EXAM: TRANSABDOMINAL AND TRANSVAGINAL ULTRASOUND OF PELVIS TECHNIQUE: Both transabdominal and transvaginal ultrasound examinations of the pelvis were performed. Transabdominal technique was performed for global imaging of the pelvis including uterus, ovaries, adnexal regions, and pelvic cul-de-sac. It was necessary to proceed with endovaginal exam following the transabdominal exam to visualize the ovaries. COMPARISON:  CT Abdomen and Pelvis 02/04/2024. FINDINGS: Uterus Measurements: 6.9 x 3.9 x 3.9 cm = volume: 41 mL. No obvious myometrial  mass. Endometrium Heterogeneous hypoechoic area at the fundus endometrium (image 49 of series 1). This area encompasses about 2 cm (image 55). See also cine series 3 images. Otherwise the echogenic endometrial stripe is up to 5 mm. Right ovary Measurements: 3.5 x 2.1 x 2.8 cm = volume: 11 mL. No ovarian mass identified. Left ovary Measurements: 2.3 x 1.5 x 1.7 cm = volume: 3 mL. No ovarian mass identified. Other findings Small volume ascites again visible in the pelvis. IMPRESSION: 1. No ovarian mass identified by ultrasound. 2. There is a 2 cm heterogeneous area at the uterine fundus which is favored to be endometrial related. Elsewhere the endometrial stripe is about 5 mm. An Endometrial Malignancy is not excluded. 3. Ascites, in conjunction with the extensive additional abdominal and pelvic abnormalities demonstrated by CT yesterday. Electronically Signed   By: Odessa Fleming M.D.   On: 02/05/2024 12:17   DG Chest Portable 1 View Result Date: 02/05/2024 CLINICAL DATA:  NG tube replacement confirmation EXAM: PORTABLE CHEST - 1 VIEW COMPARISON:  02/04/2024 FINDINGS: Cardiomediastinal silhouette and pulmonary vasculature are within normal limits. Lungs are clear. Nasogastric tube extends to the left upper quadrant the side hole located at the level of the gastroesophageal junction. It is slightly retracted compared to prior exam. IMPRESSION: Nasogastric tube terminates in the left upper quadrant in the expected site of the stomach. The side hole of the NG tube is located at the level of the gastroesophageal junction. Electronically Signed   By: Acquanetta Belling M.D.   On: 02/05/2024 07:38   DG Chest Portable 1 View Result Date: 02/04/2024 CLINICAL DATA:  NG tube placement. Abdominal discomfort, heartburn, and nausea. EXAM: PORTABLE CHEST 1 VIEW COMPARISON:  None Available. FINDINGS: The heart size and mediastinal contours are within normal limits. There is atherosclerotic calcification of the aorta. No consolidation,  effusion, or pneumothorax. An enteric tube terminates in the stomach. No acute osseous abnormality. IMPRESSION: 1. No active disease. 2. Enteric tube terminates in the stomach. Electronically Signed   By: Thornell Sartorius M.D.   On: 02/04/2024 21:20   CT ABDOMEN PELVIS W CONTRAST Result Date: 02/04/2024 CLINICAL DATA:  Abdominal pain, bloating, nausea and vomiting for 2 weeks. * Tracking Code: BO * EXAM: CT ABDOMEN AND PELVIS WITH CONTRAST TECHNIQUE: Multidetector CT imaging of the abdomen and pelvis was performed using the standard protocol following bolus administration of intravenous contrast. RADIATION DOSE REDUCTION: This exam was performed according to the departmental dose-optimization program which includes automated exposure control, adjustment of the mA and/or kV according to patient size and/or use of iterative reconstruction technique. CONTRAST:  OMNIPAQUE IOHEXOL 300 MG/ML  SOLN COMPARISON:  None Available. FINDINGS: Lower Chest: No acute findings. Hepatobiliary: A few tiny hepatic cysts are seen, however, there are multiple hypovascular masses involving the right and left hepatic lobes, largest in the central right hepatic lobe measuring 1.9 x 1.5 cm on image 26/2. These are consistent with liver metastases. Gallbladder is  unremarkable. No evidence of biliary ductal dilatation. Pancreas:  No mass or inflammatory changes. Spleen: Within normal limits in size and appearance. Adrenals/Urinary Tract: No suspicious masses identified. No evidence of ureteral calculi or hydronephrosis. Stomach/Bowel: Distal small bowel obstruction, with transition point in the right lower quadrant, likely due to peritoneal carcinoma. Rectal intussusception incidentally noted. Vascular/Lymphatic: Mild retroperitoneal lymphadenopathy in the left para-aortic and aortocaval spaces. No acute vascular findings. Reproductive: A 1.6 cm peripherally calcified fibroid is seen in the uterine fundus. Poorly defined masslike soft  tissue prominence is seen in the region of the cervix and lower uterine segment measuring 3.6 x 3.3 cm on image 63/2, possibly representing a primary cervical or endometrial carcinoma. Poorly defined soft tissue density is seen in both adnexal regions and along the uterine fundus, consistent with peritoneal carcinomatosis. Other: Mild ascites is seen. Peritoneal and omental soft tissue nodules and masses are seen throughout the abdomen and pelvis, consistent with peritoneal carcinomatosis. Largest conglomerate mass is seen in the right abdomen measuring approximately 6.7 x 3.4 cm on image 37/2. Musculoskeletal:  No suspicious bone lesions identified. IMPRESSION: Diffuse peritoneal carcinomatosis and mild ascites. Distal small bowel obstruction, with transition point in the right lower quadrant, likely due to peritoneal carcinomatosis. Masslike soft tissue prominence in the region of the lower uterine segment and cervix, possibly representing primary cervical or endometrial carcinoma. Liver metastases. Mild retroperitoneal lymphadenopathy, consistent with metastatic disease. Rectal intussusception incidentally noted. No definite lead mass identified. Electronically Signed   By: Danae Orleans M.D.   On: 02/04/2024 11:34  Ms. Butrick was interviewed and examined.  She reports another bowel movement and flatus.  She feels the abdomen is less distended.  No new complaint.  She is exhibiting signs of improvement of the small bowel obstruction.  She will undergo a small bowel follow-through study today.  The white count remains elevated and the platelets are adequate.  G-CSF will be discontinued.  Recommendations: Management of the small bowel obstruction per GYN oncology Stop G-CSF, check CBC 02/21/2024 Convert to apixaban anticoagulation when taking p.o. Please call Oncology as needed over the weekend, I will see her 02/21/2024

## 2024-02-18 NOTE — Progress Notes (Signed)
 Mobility Specialist - Progress Note   02/18/24 1104  Mobility  Activity Ambulated with assistance in hallway  Level of Assistance Modified independent, requires aide device or extra time  Assistive Device Front wheel walker  Distance Ambulated (ft) 500 ft  Activity Response Tolerated well  Mobility Referral Yes  Mobility visit 1 Mobility  Mobility Specialist Start Time (ACUTE ONLY) 1048  Mobility Specialist Stop Time (ACUTE ONLY) 1103  Mobility Specialist Time Calculation (min) (ACUTE ONLY) 15 min   Pt received in bed and agreeable to mobility. No complaints during session. Pt to bed after session with all needs met.   [facility]

## 2024-02-19 DIAGNOSIS — C786 Secondary malignant neoplasm of retroperitoneum and peritoneum: Secondary | ICD-10-CM | POA: Diagnosis not present

## 2024-02-19 LAB — CBC WITH DIFFERENTIAL/PLATELET
Abs Immature Granulocytes: 1.23 10*3/uL — ABNORMAL HIGH (ref 0.00–0.07)
Basophils Absolute: 0.2 10*3/uL — ABNORMAL HIGH (ref 0.0–0.1)
Basophils Relative: 1 %
Eosinophils Absolute: 0.1 10*3/uL (ref 0.0–0.5)
Eosinophils Relative: 1 %
HCT: 32.1 % — ABNORMAL LOW (ref 36.0–46.0)
Hemoglobin: 9.9 g/dL — ABNORMAL LOW (ref 12.0–15.0)
Immature Granulocytes: 7 %
Lymphocytes Relative: 11 %
Lymphs Abs: 2 10*3/uL (ref 0.7–4.0)
MCH: 30.4 pg (ref 26.0–34.0)
MCHC: 30.8 g/dL (ref 30.0–36.0)
MCV: 98.5 fL (ref 80.0–100.0)
Monocytes Absolute: 1.9 10*3/uL — ABNORMAL HIGH (ref 0.1–1.0)
Monocytes Relative: 10 %
Neutro Abs: 13 10*3/uL — ABNORMAL HIGH (ref 1.7–7.7)
Neutrophils Relative %: 70 %
Platelets: 271 10*3/uL (ref 150–400)
RBC: 3.26 MIL/uL — ABNORMAL LOW (ref 3.87–5.11)
RDW: 13 % (ref 11.5–15.5)
WBC: 18.3 10*3/uL — ABNORMAL HIGH (ref 4.0–10.5)
nRBC: 0.2 % (ref 0.0–0.2)

## 2024-02-19 LAB — COMPREHENSIVE METABOLIC PANEL
ALT: 79 U/L — ABNORMAL HIGH (ref 0–44)
AST: 37 U/L (ref 15–41)
Albumin: 2.6 g/dL — ABNORMAL LOW (ref 3.5–5.0)
Alkaline Phosphatase: 246 U/L — ABNORMAL HIGH (ref 38–126)
Anion gap: 8 (ref 5–15)
BUN: 31 mg/dL — ABNORMAL HIGH (ref 8–23)
CO2: 25 mmol/L (ref 22–32)
Calcium: 8.6 mg/dL — ABNORMAL LOW (ref 8.9–10.3)
Chloride: 104 mmol/L (ref 98–111)
Creatinine, Ser: 0.79 mg/dL (ref 0.44–1.00)
GFR, Estimated: >60 mL/min (ref >60)
Glucose, Bld: 103 mg/dL — ABNORMAL HIGH (ref 70–99)
Potassium: 4.4 mmol/L (ref 3.5–5.1)
Sodium: 137 mmol/L (ref 135–145)
Total Bilirubin: 0.3 mg/dL (ref 0.0–1.2)
Total Protein: 6 g/dL — ABNORMAL LOW (ref 6.5–8.1)

## 2024-02-19 LAB — HEPARIN LEVEL (UNFRACTIONATED)
Heparin Unfractionated: 0.87 [IU]/mL — ABNORMAL HIGH (ref 0.30–0.70)
Heparin Unfractionated: 0.72 [IU]/mL — ABNORMAL HIGH (ref 0.30–0.70)

## 2024-02-19 LAB — GLUCOSE, CAPILLARY
Glucose-Capillary: 97 mg/dL (ref 70–99)
Glucose-Capillary: 154 mg/dL — ABNORMAL HIGH (ref 70–99)

## 2024-02-19 MED ORDER — TRAVASOL 10 % IV SOLN
INTRAVENOUS | Status: AC
  Filled 2024-02-19: qty 890.4

## 2024-02-19 NOTE — Progress Notes (Signed)
 PHARMACY - TOTAL PARENTERAL NUTRITION CONSULT NOTE   Indication: SBO  Assessment: 64 yoF admitted 2/7 after 2 weeks of progressively worsening reflux and abdominal distention with n/v. 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 Plan for bowel rest +/- venting G-tube while patient undergoes chemotherapy to hopefully relieve obstruction. Pharmacy consulted for TPN management.  Glucose / Insulin: no hx DM; CBG goal 100-150 - Negligible SSI use, even with daily dexamethasone on board >> CBGs stopped 2/20. BG 97 this AM Electrolytes: all stable WNL, including CorrCa (9.7) Renal: SCr stable WNL; BUN mildly elevated Hepatic: LFTs improving, Alk Phos elevated - Tbili remains stable WNL - TG WNL 2/17 I/O: Continues to have improving bowel function as noted 2/20 per GynOnc (flatus, small BMs) - NG output: 750 mL - 3 small stool - UOP: 400 mL + 3 unmeasured - no mIVF GI Imaging: - 2/7 CT a/p: distal SBO d/t diffuse peritoneal carcinomatosis, with transition point in RLQ, hepatic and retroperitoneal metastases; incidental finding of rectal intussusception - 2/11 AXR: persistent but significantly improved SB dilation when compared with prior - 2/13 AXR: Worsening small bowel obstruction.  - 2/14 MRI pelvis: Abnormal amorphous soft tissue throughout lower uterine segment and cervix with ileocolic and likely ovarian mets. GI Surgeries / Procedures: NA  Central access: PICC TPN start date: 2/11  Nutritional Goals: Goal TPN rate is 70 mL/hr (provides 89 g of protein and 1717 kcals per day)  RD Assessment: Estimated Needs Total Energy Estimated Needs: 1625-1900 kcal Total Protein Estimated Needs: 80-95g Total Fluid Estimated Needs: 1.9L/day  Current Nutrition: NPO, TPN  Plan:  Continue custom TPN @ goal rate of 70 mL/hr Electrolytes in TPN: No changes. Na - 80 mEq/L K - 40 mEq/L Ca - 3 mEq/L Mg - 5 mEq/L Phos - 15 mmol/L Cl:Ac ratio - 1:2 Add standard MVI and trace elements to TPN Continue to hold  chromium for national backorder mIVF per MD Monitor TPN labs on Mon/Thurs  Cindi Carbon, PharmD 02/19/24 9:46 AM

## 2024-02-19 NOTE — Progress Notes (Signed)
 PROGRESS NOTE  FERNANDA TWADDELL  ONG:[id number] DOB: [birth year] DOA: 02/04/2024 PCP: Marden Noble, MD (Inactive)   Brief Narrative: Patient is a 89 year old female with history of hypertension, breast cancer who presented with abdominal distention, vomiting for 2 weeks.  CT abdomen was concerning for new metastatic disease from possible GYN origin, SBO.  Further workup showed peritoneal carcinomatosis with likely GYN malignancy.  GYN oncology, general surgery consulted pelvic MRI showed advanced GYN malignancy.  CT chest with contrast showed distal pulmonary present with cor pulmonale, started on heparin drip.  Received first cycle of chemo on 2/15.  Hospital course remarkable for persistent bowel obstruction, started on TPN, Decadron.  Palliative care following.  Now having bowel movements.  Small bowel SBO protocol shows nondilated colon with contrast on entire colon.  Possible plan for removal of NG tube and start on clear liquid diet.  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  CT chest with contrast showed distal PE, started on heparin drip.  Received first cycle of chemo on 2/15.  Hospital course remarkable for persistent bowel obstruction, started on TPN, Decadron.  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  Patient now having bowel movements.  Abdomen is soft and slightly distended.  Bowel sound present Small bowel SBO protocol shows nondilated colon with contrast on entire colon.  Possible plan for removal of NG tube and start on clear liquid diet.  Will wait for oncology to decide on that  Right-sided distal PE: Started on heparin drip.  Lower EXTR Doppler positive for DVT.  Eventual plan is to transition to Eliquis.  Echo showed preserved EF  Leukocytosis: She received Granix by oncology. Improving.   No fever.  Poor nutritional status/severe protein calorie malnutrition: On TPN  Hypertension: On losartan.  Continue as needed medications for severe hypertension  GERD: Continue PPI  Normocytic anemia: Currently hemoglobin stable  Goals of care:Palliative care was following for goals of care .CODE STATUS DNR   Nutrition Problem: Moderate Malnutrition Etiology: acute illness    DVT prophylaxis:iv heparin     Code Status: Limited: Do not attempt resuscitation (DNR) -DNR-LIMITED -Do Not Intubate/DNI   Family Communication: Called and discussed with son-in-law Dr. Venetia Maxon on phone on 2/20 Discussed with daughter on phone at bedside on 2/21  Patient status:Inpatient  Patient is from :Home  Anticipated discharge GM:[facility]  Estimated DC date:After oncology clearance   Consultants: General Surgery, palliative care, oncology  Procedures: None yet  Antimicrobials:  Anti-infectives (From admission, onward)    None       Subjective: Patient seen and examined at bedside today.  She was comfortable.  Denies abdominal pain.  She thinks that she had a bowel movement this morning.  We discussed about finding of abdominal x-ray yesterday.  No abdomen pain or discomfort.  Objective: Vitals:   02/18/24 1353 02/18/24  2050 02/19/24 0529 02/19/24 0713  BP: 109/72 117/65 130/63    Pulse: 71 70 70   Resp: 20 18 14    Temp: 97.6 F (36.4 C) 97.7 F (36.5 C) 97.6 F (36.4 C)   TempSrc: Oral Oral Oral   SpO2: 92% 94% 96%   Weight:    56.1 kg  Height:        Intake/Output Summary (Last 24 hours) at 02/19/2024 1100 Last data filed at 02/19/2024 0930 Gross per 24 hour  Intake 1727.98 ml  Output 1050 ml  Net 677.98 ml   Filed Weights   02/17/24 0716 02/18/24 0715 02/19/24 0713  Weight: 56.4 kg 57.5 kg 56.1 kg    Examination:     General exam: Overall comfortable, not in distress, pleasant elderly female HEENT: PERRL, NG tube Respiratory system:  no wheezes or crackles  Cardiovascular system: S1 & S2 heard, RRR.  Gastrointestinal system: Abdomen is nondistended, soft and nontender.  Bowel sounds present Central nervous system: Alert and oriented Extremities: No edema, no clubbing ,no cyanosis Skin: No rashes, no ulcers,no icterus      Data Reviewed: I have personally reviewed following labs and imaging studies  CBC: Recent Labs  Lab 02/14/24 0200 02/16/24 0138 02/17/24 0552 02/18/24 0554 02/19/24 0817  WBC 35.7* 29.9* 26.4* 23.5* 18.3*  NEUTROABS  --  22.3* 21.3* 19.5* 13.0*  HGB 9.9* 9.2* 9.4* 9.3* 9.9*  HCT 30.6* 29.5* 30.5* 30.5* 32.1*  MCV 97.5 99.0 99.7 101.0* 98.5  PLT 320 253 252 254 271   Basic Metabolic Panel: Recent Labs  Lab 02/13/24 0557 02/14/24 0200 02/15/24 0515 02/17/24 0552 02/19/24 0817  NA 139 135 136 137 137  K 4.4 4.4 4.3 4.2 4.4  CL 110 107 108 105 104  CO2 23 20* 23 23 25   GLUCOSE 115* 119* 98 119* 103*  BUN 31* 33* 34* 35* 31*  CREATININE 0.73 0.75 0.77 0.71 0.79  CALCIUM 8.0* 7.9* 8.0* 8.2* 8.6*  MG 2.2 2.4 2.2 2.1  --   PHOS  --  2.8 2.8 3.2  --      No results found for this or any previous visit (from the past 240 hours).   Radiology Studies: DG Abd Portable 1V-Small Bowel Obstruction Protocol-initial, 8 hr delay Result Date: 02/18/2024 CLINICAL DATA:  8 hour delay small bowel obstruction EXAM:  PORTABLE ABDOMEN - 1 VIEW COMPARISON:  Abdominal x-ray 02/18/2024 FINDINGS: Enteric tube tip is in the fundus of the stomach with sidehole at the level of the gastroesophageal junction. Contrast is seen throughout nondilated colon to the level of the rectum. No dilated bowel loops are seen. IMPRESSION: 1. Enteric tube tip is in the fundus of the stomach with sidehole at the level of the gastroesophageal junction. Recommend advancement 8 cm. 2. Contrast is seen throughout nondilated colon to the level of the rectum. No evidence for bowel obstruction. Electronically Signed   By: Darliss Cheney M.D.   On: 02/18/2024 20:57   DG Abd Portable 1V-Small Bowel Protocol-Position Verification Result Date: 02/18/2024 CLINICAL DATA:  [id number] Encounter for imaging study to confirm nasogastric (NG) tube placement [id number] EXAM: PORTABLE ABDOMEN - 1 VIEW COMPARISON:  02/10/2024 abdominal radiograph FINDINGS: Enteric tube terminates in the proximal stomach. No dilated small bowel loops. No evidence of pneumatosis or pneumoperitoneum. Mild elevation of the left hemidiaphragm with clear lung bases. IMPRESSION: Enteric tube terminates in the proximal stomach. Electronically Signed   By: Delbert Phenix M.D.   On: 02/18/2024 13:42  Scheduled Meds:  Chlorhexidine Gluconate Cloth  6 each Topical Daily   dexamethasone (DECADRON) injection  8 mg Intravenous Daily   losartan  100 mg Per Tube q morning   pantoprazole (PROTONIX) IV  40 mg Intravenous Q12H   sodium chloride flush  10-40 mL Intracatheter Q12H   Continuous Infusions:  heparin 1,100 Units/hr (02/19/24 1032)   TPN ADULT (ION) 70 mL/hr at 02/19/24 0921   TPN ADULT (ION)       LOS: 15 days   Burnadette Pop, MD [facility] P2/22/2025, 11:00 AM

## 2024-02-19 NOTE — Progress Notes (Signed)
 GYN Onc Progress Note  Subjective: No nausea w/NGT clamped. +flatus  Objective: Vital signs in last 24 hours: Temp:  [97.6 F (36.4 C)-97.7 F (36.5 C)] 97.6 F (36.4 C) (02/22 0529) Pulse Rate:  [70-71] 70 (02/22 0529) Resp:  [14-20] 14 (02/22 0529) BP: (109-130)/(63-72) 130/63 (02/22 0529) SpO2:  [92 %-96 %] 96 % (02/22 0529) Weight:  [56.1 kg] 56.1 kg (02/22 0713) Last BM Date : 02/19/24  Intake/Output from previous day: 02/21 0701 - 02/22 0700 In: 1555.9 [I.V.:1555.9] Out: 1150 [Urine:400; Emesis/NG output:750]  Physical Examination (performed by Dr. Pricilla Holm): Gen: NAD, alert and oriented;  CV: RRR, no murmurs or rubs Pulm: No rhonchi or wheezing Abd: softer, moderately distended but appears less, normoactive BS, nontender Ext: warm and well perfused, no edema or tenderness to palpation  Labs:    Latest Ref Rng & Units 02/19/2024    8:17 AM 02/18/2024    5:54 AM 02/17/2024    5:52 AM  CBC  WBC 4.0 - 10.5 K/uL 18.3  23.5  26.4   Hemoglobin 12.0 - 15.0 g/dL 9.9  9.3  9.4   Hematocrit 36.0 - 46.0 % 32.1  30.5  30.5   Platelets 150 - 400 K/uL 271  254  252       Latest Ref Rng & Units 02/19/2024    8:17 AM 02/17/2024    5:52 AM 02/15/2024    5:15 AM  BMP  Glucose 70 - 99 mg/dL 161  096  98   BUN 8 - 23 mg/dL 31  35  34   Creatinine 0.44 - 1.00 mg/dL 0.45  4.09  8.11   Sodium 135 - 145 mmol/L 137  137  136   Potassium 3.5 - 5.1 mmol/L 4.4  4.2  4.3   Chloride 98 - 111 mmol/L 104  105  108   CO2 22 - 32 mmol/L 25  23  23    Calcium 8.9 - 10.3 mg/dL 8.6  8.2  8.0    DG Abd Portable 1V-Small Bowel Obstruction Protocol-initial, 8 hr delay Result Date: 02/18/2024 CLINICAL DATA:  8 hour delay small bowel obstruction EXAM: PORTABLE ABDOMEN - 1 VIEW COMPARISON:  Abdominal x-ray 02/18/2024 FINDINGS: Enteric tube tip is in the fundus of the stomach with sidehole at the level of the gastroesophageal junction. Contrast is seen throughout nondilated colon to the level of the  rectum. No dilated bowel loops are seen. IMPRESSION: 1. Enteric tube tip is in the fundus of the stomach with sidehole at the level of the gastroesophageal junction. Recommend advancement 8 cm. 2. Contrast is seen throughout nondilated colon to the level of the rectum. No evidence for bowel obstruction. Electronically Signed   By: Darliss Cheney M.D.   On: 02/18/2024 20:57     Assessment: 89 y.o. 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 Truett Perna. PE diagnosed on staging CT chest from 02/11/2024-currently on heparin drip.    Malignant bowel obstruction: Imaging shows contrast in the rectum w/SBO protocol.  NGT clamped.     VTE--DVT/PE: Anticoagulation w/heparin drip  >check residual after clamping; if < 150 ml will remove the NGT and start a clear liquid diet  LOS: 15 days    Antionette Char, MD 02/19/2024, 11:28 AM

## 2024-02-19 NOTE — Progress Notes (Signed)
 PHARMACY - ANTICOAGULATION CONSULT NOTE  Pharmacy Consult for heparin  Indication: acute pulmonary embolus and DVT  Allergies  Allergen Reactions   Calcium-Containing Compounds Other (See Comments)    Upsets the stomach, so it is not taken very often    Patient Measurements: Height: 5\' 4"  (162.6 cm) Weight: 56.1 kg (123 lb 10.9 oz) IBW/kg (Calculated) : 54.7 Heparin Dosing Weight: use total body weight   Vital Signs: Temp: 98.4 F (36.9 C) (02/22 1411) Temp Source: Oral (02/22 1411) BP: 110/59 (02/22 1411) Pulse Rate: 68 (02/22 1411)  Labs: Recent Labs    02/17/24 0552 02/17/24 0832 02/18/24 0554 02/19/24 0817 02/19/24 1819  HGB 9.4*  --  9.3* 9.9*  --   HCT 30.5*  --  30.5* 32.1*  --   PLT 252  --  254 271  --   HEPARINUNFRC  --    < > 0.53 0.87* 0.72*  CREATININE 0.71  --   --  0.79  --    < > = values in this interval not displayed.    Estimated Creatinine Clearance: 43.6 mL/min (by C-G formula based on SCr of 0.79 mg/dL).  Assessment: 89 y/o F with metastatic ovarian cancer admitted with SBO secondary to peritoneal carcinomatosis. Chest CT on 2/14 showed incidental acute PE in the "the distal right pulmonary artery and lobar and segmental right upper lobe and right middle lobe pulmonary artery branches" with borderline elevated RV/LV ratio.  LE doppler on 02/12/24 positive for acute DVT in the left peroneal veins and left posterior tibial veins. Pharmacy consulted for IV heparin dosing for VTE treatment.  Significant events:  - 2/15: received carboplatin/paclitaxel   Today, 02/19/2024: Heparin level = 0.72, slightly supratherapeutic on heparin infusion of 1100 units/hr CBC: Hgb low but stable, Plt WNL No s/sx of bleeding reported  Goal of Therapy:  Heparin level 0.3-0.7 units/ml Monitor platelets by anticoagulation protocol: Yes   Plan:  Reduce heparin to 1000 units/hr Check heparin level 8 hours after rate change Daily CBC, heparin level Monitor for  signs of bleeding F/u plans for long-term anticoag (copay for both DOACs is $30/month)   Cherylin Mylar, PharmD Clinical Pharmacist  2/22/20258:13 PM

## 2024-02-19 NOTE — Progress Notes (Signed)
 PHARMACY - ANTICOAGULATION CONSULT NOTE  Pharmacy Consult for heparin  Indication: acute pulmonary embolus and DVT  Allergies  Allergen Reactions   Calcium-Containing Compounds Other (See Comments)    Upsets the stomach, so it is not taken very often    Patient Measurements: Height: 5\' 4"  (162.6 cm) Weight: 56.1 kg (123 lb 10.9 oz) IBW/kg (Calculated) : 54.7 Heparin Dosing Weight: use total body weight   Vital Signs: Temp: 97.6 F (36.4 C) (02/22 0529) Temp Source: Oral (02/22 0529) BP: 130/63 (02/22 0529) Pulse Rate: 70 (02/22 0529)  Labs: Recent Labs    02/17/24 0552 02/17/24 0832 02/18/24 0554 02/19/24 0817  HGB 9.4*  --  9.3* 9.9*  HCT 30.5*  --  30.5* 32.1*  PLT 252  --  254 271  HEPARINUNFRC  --  0.38 0.53 0.87*  CREATININE 0.71  --   --   --     Estimated Creatinine Clearance: 43.6 mL/min (by C-G formula based on SCr of 0.71 mg/dL).  Assessment: 89 y/o F with metastatic ovarian cancer admitted with SBO secondary to peritoneal carcinomatosis. Chest CT on 2/14 showed incidental acute PE in the "the distal right pulmonary artery and lobar and segmental right upper lobe and right middle lobe pulmonary artery branches" with borderline elevated RV/LV ratio.  LE doppler on 02/12/24 positive for acute DVT in the left peroneal veins and left posterior tibial veins. Pharmacy consulted for IV heparin dosing for VTE treatment.  Significant events:  - 2/15: received carboplatin/paclitaxel   Today, 02/19/2024: Heparin level = 0.87 is now supratherapeutic on heparin infusion of 1200 units/hr Confirmed UFH infusing via PICC, labs being obtained via peripheral stick CBC: Hgb low but stable, Plt WNL Confirmed with RN - no signs of bleeding  Goal of Therapy:  Heparin level 0.3-0.7 units/ml Monitor platelets by anticoagulation protocol: Yes   Plan:  Reduce heparin to 1100 units/hr Check heparin level 8 hours after rate change Daily CBC, heparin level Monitor for signs of  bleeding F/u plans for long-term anticoag (copay for both DOACs is $30/month)  Cindi Carbon, PharmD 02/19/24 9:25 AM

## 2024-02-20 DIAGNOSIS — C786 Secondary malignant neoplasm of retroperitoneum and peritoneum: Secondary | ICD-10-CM | POA: Diagnosis not present

## 2024-02-20 LAB — CBC WITH DIFFERENTIAL/PLATELET
Abs Immature Granulocytes: 0.9 10*3/uL — ABNORMAL HIGH (ref 0.00–0.07)
Basophils Absolute: 0 10*3/uL (ref 0.0–0.1)
Basophils Relative: 0 %
Eosinophils Absolute: 0 10*3/uL (ref 0.0–0.5)
Eosinophils Relative: 0 %
HCT: 31.2 % — ABNORMAL LOW (ref 36.0–46.0)
Hemoglobin: 9.7 g/dL — ABNORMAL LOW (ref 12.0–15.0)
Lymphocytes Relative: 8 %
Lymphs Abs: 1 10*3/uL (ref 0.7–4.0)
MCH: 30.6 pg (ref 26.0–34.0)
MCHC: 31.1 g/dL (ref 30.0–36.0)
MCV: 98.4 fL (ref 80.0–100.0)
Metamyelocytes Relative: 2 %
Monocytes Absolute: 0.4 10*3/uL (ref 0.1–1.0)
Monocytes Relative: 3 %
Myelocytes: 2 %
Neutro Abs: 10.7 10*3/uL — ABNORMAL HIGH (ref 1.7–7.7)
Neutrophils Relative %: 82 %
Platelets: 267 10*3/uL (ref 150–400)
Promyelocytes Relative: 3 %
RBC: 3.17 MIL/uL — ABNORMAL LOW (ref 3.87–5.11)
RDW: 13 % (ref 11.5–15.5)
WBC: 13 10*3/uL — ABNORMAL HIGH (ref 4.0–10.5)
nRBC: 0.3 % — ABNORMAL HIGH (ref 0.0–0.2)

## 2024-02-20 LAB — GLUCOSE, CAPILLARY
Glucose-Capillary: 102 mg/dL — ABNORMAL HIGH (ref 70–99)
Glucose-Capillary: 128 mg/dL — ABNORMAL HIGH (ref 70–99)
Glucose-Capillary: 129 mg/dL — ABNORMAL HIGH (ref 70–99)

## 2024-02-20 LAB — HEPARIN LEVEL (UNFRACTIONATED)
Heparin Unfractionated: 0.77 [IU]/mL — ABNORMAL HIGH (ref 0.30–0.70)
Heparin Unfractionated: 0.68 [IU]/mL (ref 0.30–0.70)

## 2024-02-20 MED ORDER — LOSARTAN POTASSIUM 50 MG PO TABS
100.0000 mg | ORAL_TABLET | Freq: Every morning | ORAL | Status: DC
  Administered 2024-02-20 – 2024-02-21 (×2): 100 mg via ORAL
  Filled 2024-02-20 (×2): qty 2

## 2024-02-20 MED ORDER — INSULIN ASPART 100 UNIT/ML IJ SOLN
0.0000 [IU] | Freq: Three times a day (TID) | INTRAMUSCULAR | Status: DC
  Administered 2024-02-20 – 2024-02-21 (×3): 1 [IU] via SUBCUTANEOUS
  Administered 2024-02-22: 2 [IU] via SUBCUTANEOUS
  Administered 2024-02-23: 3 [IU] via SUBCUTANEOUS
  Administered 2024-02-24 (×2): 1 [IU] via SUBCUTANEOUS
  Administered 2024-02-24 – 2024-02-25 (×2): 2 [IU] via SUBCUTANEOUS
  Administered 2024-02-25 – 2024-02-26 (×2): 1 [IU] via SUBCUTANEOUS
  Administered 2024-02-26: 2 [IU] via SUBCUTANEOUS
  Administered 2024-02-26 – 2024-02-28 (×5): 1 [IU] via SUBCUTANEOUS

## 2024-02-20 MED ORDER — PROCHLORPERAZINE EDISYLATE 10 MG/2ML IJ SOLN
10.0000 mg | Freq: Four times a day (QID) | INTRAMUSCULAR | Status: AC
Start: 2024-02-20 — End: 2024-02-21
  Administered 2024-02-20 – 2024-02-21 (×3): 10 mg via INTRAVENOUS
  Filled 2024-02-20 (×3): qty 2

## 2024-02-20 MED ORDER — TRAVASOL 10 % IV SOLN
INTRAVENOUS | Status: AC
Start: 2024-02-20 — End: 2024-02-21
  Filled 2024-02-20: qty 890.4

## 2024-02-20 MED ORDER — PROCHLORPERAZINE EDISYLATE 10 MG/2ML IJ SOLN
10.0000 mg | Freq: Four times a day (QID) | INTRAMUSCULAR | Status: DC | PRN
  Administered 2024-02-20: 10 mg via INTRAVENOUS
  Filled 2024-02-20: qty 2

## 2024-02-20 NOTE — Plan of Care (Signed)
  Problem: Education: Goal: Knowledge of General Education information will improve Description: Including pain rating scale, medication(s)/side effects and non-pharmacologic comfort measures Outcome: Progressing   Problem: Health Behavior/Discharge Planning: Goal: Ability to manage health-related needs will improve Outcome: Progressing   Problem: Clinical Measurements: Goal: Ability to maintain clinical measurements within normal limits will improve Outcome: Progressing Goal: Will remain free from infection Outcome: Progressing Goal: Diagnostic test results will improve Outcome: Progressing   Problem: Activity: Goal: Risk for activity intolerance will decrease Outcome: Progressing   Problem: Nutrition: Goal: Adequate nutrition will be maintained Outcome: Progressing   Problem: Pain Managment: Goal: General experience of comfort will improve and/or be controlled Outcome: Progressing   Problem: Safety: Goal: Ability to remain free from injury will improve Outcome: Progressing   Problem: Skin Integrity: Goal: Risk for impaired skin integrity will decrease Outcome: Progressing

## 2024-02-20 NOTE — Progress Notes (Signed)
 PHARMACY - ANTICOAGULATION CONSULT NOTE  Pharmacy Consult for heparin  Indication: acute pulmonary embolus and DVT  Allergies  Allergen Reactions   Calcium-Containing Compounds Other (See Comments)    Upsets the stomach, so it is not taken very often    Patient Measurements: Height: 5\' 4"  (162.6 cm) Weight: 56.1 kg (123 lb 10.9 oz) IBW/kg (Calculated) : 54.7 Heparin Dosing Weight: use total body weight   Vital Signs: Temp: 97.5 F (36.4 C) (02/23 1323) Temp Source: Oral (02/23 1323) BP: 108/55 (02/23 1323) Pulse Rate: 73 (02/23 1323)  Labs: Recent Labs    02/18/24 0554 02/19/24 0817 02/19/24 1819 02/20/24 0651 02/20/24 1653  HGB 9.3* 9.9*  --  9.7*  --   HCT 30.5* 32.1*  --  31.2*  --   PLT 254 271  --  267  --   HEPARINUNFRC 0.53 0.87* 0.72* 0.77* 0.68  CREATININE  --  0.79  --   --   --     Estimated Creatinine Clearance: 43.6 mL/min (by C-G formula based on SCr of 0.79 mg/dL).  Assessment: 89 y/o F with metastatic ovarian cancer admitted with SBO secondary to peritoneal carcinomatosis. Chest CT on 2/14 showed incidental acute PE in the "the distal right pulmonary artery and lobar and segmental right upper lobe and right middle lobe pulmonary artery branches" with borderline elevated RV/LV ratio.  LE doppler on 02/12/24 positive for acute DVT in the left peroneal veins and left posterior tibial veins. Pharmacy consulted for IV heparin dosing for VTE treatment.  Significant events:  - 2/15: received carboplatin/paclitaxel   Today, 02/20/2024: Heparin level now therapeutic at 0.68 (higher end of normal range) on 900 units/hr UFH infusing via PICC, labs being obtained via peripheral stick CBC: Hgb low but stable, Plt WNL Note chemo received 2/15 No bleeding reported  Goal of Therapy:  Heparin level 0.3-0.7 units/ml Monitor platelets by anticoagulation protocol: Yes   Plan:  Continue heparin infusion at 900 units/hr Check confirmatory heparin level in 8  hours Daily CBC, heparin level Monitor for signs of bleeding   Cherylin Mylar, PharmD Clinical Pharmacist  2/23/20255:43 PM

## 2024-02-20 NOTE — Progress Notes (Signed)
 GYN Onc Progress Note  Subjective: NGT removed yesterday.  Intermittent nausea following removal--no emesis.  +BM  Objective: Vital signs in last 24 hours: Temp:  [97.4 F (36.3 C)-98.4 F (36.9 C)] 97.4 F (36.3 C) (02/23 0527) Pulse Rate:  [68-74] 73 (02/23 0527) Resp:  [16-17] 17 (02/23 0527) BP: (110-132)/(59-69) 132/64 (02/23 0527) SpO2:  [93 %-96 %] 96 % (02/23 0527) Last BM Date : 02/20/24  Intake/Output from previous day: 02/22 0701 - 02/23 0700 In: 1918.5 [P.O.:240; I.V.:1648.5; NG/GT:30] Out: 630 [Urine:600; Emesis/NG output:30]  Physical Examination (performed by Dr. Pricilla Holm): Gen: NAD, alert and oriented;  CV: RRR, no murmurs or rubs Pulm: No rhonchi or wheezing Abd: more distended, normoactive BS, nontender Ext: warm and well perfused, no edema or tenderness to palpation  Labs:    Latest Ref Rng & Units 02/20/2024    6:51 AM 02/19/2024    8:17 AM 02/18/2024    5:54 AM  CBC  WBC 4.0 - 10.5 K/uL 13.0  18.3  23.5   Hemoglobin 12.0 - 15.0 g/dL 9.7  9.9  9.3   Hematocrit 36.0 - 46.0 % 31.2  32.1  30.5   Platelets 150 - 400 K/uL 267  271  254       Latest Ref Rng & Units 02/19/2024    8:17 AM 02/17/2024    5:52 AM 02/15/2024    5:15 AM  BMP  Glucose 70 - 99 mg/dL 161  096  98   BUN 8 - 23 mg/dL 31  35  34   Creatinine 0.44 - 1.00 mg/dL 0.45  4.09  8.11   Sodium 135 - 145 mmol/L 137  137  136   Potassium 3.5 - 5.1 mmol/L 4.4  4.2  4.3   Chloride 98 - 111 mmol/L 104  105  108   CO2 22 - 32 mmol/L 25  23  23    Calcium 8.9 - 10.3 mg/dL 8.6  8.2  8.0    No results found.    Assessment: 89 y.o. 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 Truett Perna. PE diagnosed on staging CT chest from 02/11/2024-currently on heparin drip.    Malignant bowel obstruction: Worsening distension/nausea since NGT discontinued     VTE--DVT/PE: Anticoagulation w/heparin drip  >recommend  sips as tolerated >antiemetics prn   LOS: 16 days    Antionette Char, MD 02/20/2024, 9:22 AM

## 2024-02-20 NOTE — Progress Notes (Signed)
 PROGRESS NOTE  Tracy Preston  NFA:[id number] DOB: Oct 27, 1935 DOA: 02/04/2024 PCP: Marden Noble, MD (Inactive)   Brief Narrative: Patient is a 89 year old female with history of hypertension, breast cancer who presented with abdominal distention, vomiting for 2 weeks.  CT abdomen was concerning for new metastatic disease from possible GYN origin, SBO.  Further workup showed peritoneal carcinomatosis with likely GYN malignancy.  GYN oncology, general surgery consulted pelvic MRI showed advanced GYN malignancy.  CT chest with contrast showed distal pulmonary present with cor pulmonale, started on heparin drip.  Received first cycle of chemo on 2/15.  Hospital course remarkable for persistent bowel obstruction, started on TPN, Decadron.  Palliative care following.  Now having bowel movements.  Small bowel SBO protocol shows nondilated colon with contrast on entire colon. NG tube removed on 2/22, started on clears.  Assessment & Plan:  Principal Problem:   Carcinomatosis peritonei causing obstruction Active Problems:   SBO (small bowel obstruction) (HCC)   GERD (gastroesophageal reflux disease)   Essential hypertension   Peritoneal carcinomatosis (HCC)   Anemia   Malnutrition of moderate degree   Palliative care encounter   Adenocarcinoma [facility])   Malignant neoplasm metastatic to liver [facility])   DNR (do not resuscitate)   Goals of care, counseling/discussion   Need for emotional support   Counseling and coordination of care  Malignant small bowel obstruction secondary to peritoneal carcinomatosis/ovarian adenocarcinoma with metastasis: Presented with abdominal distention, vomiting.GYN oncology, general surgery consulted.The  pelvic MRI showed advanced GYN malignancy.  CT chest with contrast showed distal PE, started on heparin drip.  Received first cycle of chemo on 2/15.  Hospital course remarkable for persistent bowel obstruction, started on TPN, Decadron. 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  Small bowel SBO protocol on 2/22 showed nondilated colon with contrast on entire colon. NG tube removed.  Had bowel movements today but developed due to nausea.  We continue to recommend to liquid diet as tolerated.  Continue TPN  Right-sided distal PE: Started on heparin drip.  Lower EXTR Doppler positive for DVT.  Eventual plan is to transition to Eliquis.  Echo showed preserved EF  Leukocytosis: She received Granix by oncology. Improving.   No fever.  Poor nutritional status/severe protein calorie malnutrition: On TPN  Hypertension: On losartan.  Continue as needed medications for severe hypertension  GERD: Continue PPI  Normocytic anemia: Currently hemoglobin stable  Goals of care:Palliative care was following for goals of care .CODE STATUS DNR   Nutrition Problem: Moderate Malnutrition Etiology: acute illness    DVT prophylaxis:iv heparin     Code Status: Limited: Do not attempt resuscitation (DNR) -DNR-LIMITED -Do Not Intubate/DNI   Family Communication: Called and discussed with son-in-law Dr. Venetia Maxon on phone on 2/23   Patient status:Inpatient  Patient is from :Home  Anticipated discharge IO:NGEX  Estimated DC date:After oncology clearance   Consultants: General Surgery, palliative care, oncology  Procedures: None yet  Antimicrobials:  Anti-infectives (From admission, onward)    None       Subjective: Patient seen and examined at bedside today.  Clear liquid diet yesterday yesterday.  She developed some nausea today.  Having  bowel movements.  Abdomen soft but slightly distended today.  Bowel sounds present  Objective: Vitals:   02/19/24 0713 02/19/24 1411 02/19/24 2143 02/20/24 0527  BP:  (!) 110/59 128/69 132/64  Pulse:  68 74 73  Resp:  16 17 17   Temp:  98.4 F (36.9 C)  97.9 F (36.6 C) (!) 97.4 F (36.3 C)  TempSrc:  Oral Oral Oral  SpO2:  93% 96% 96%  Weight: 56.1 kg      Height:        Intake/Output Summary (Last 24 hours) at 02/20/2024 1049 Last data filed at 02/20/2024 0112 Gross per 24 hour  Intake 1419.87 ml  Output 330 ml  Net 1089.87 ml   Filed Weights   02/17/24 0716 02/18/24 0715 02/19/24 0713  Weight: 56.4 kg 57.5 kg 56.1 kg    Examination:    General exam: Overall comfortable, not in distress, pleasant elderly female HEENT: PERRL Respiratory system:  no wheezes or crackles  Cardiovascular system: S1 & S2 heard, RRR.  Gastrointestinal system: Abdomen is mildly distended, nontender, bowel sounds present Central nervous system: Alert and oriented Extremities: No edema, no clubbing ,no cyanosis Skin: No rashes, no ulcers,no icterus     Data Reviewed: I have personally reviewed following labs and imaging studies  CBC: Recent Labs  Lab 02/16/24 0138 02/17/24 0552 02/18/24 0554 02/19/24 0817 02/20/24 0651  WBC 29.9* 26.4* 23.5* 18.3* 13.0*  NEUTROABS 22.3* 21.3* 19.5* 13.0* 10.7*  HGB 9.2* 9.4* 9.3* 9.9* 9.7*  HCT 29.5* 30.5* 30.5* 32.1* 31.2*  MCV 99.0 99.7 101.0* 98.5 98.4  PLT 253 252 254 271 267   Basic Metabolic Panel: Recent Labs  Lab 02/14/24 0200 02/15/24 0515 02/17/24 0552 02/19/24 0817  NA 135 136 137 137  K 4.4 4.3 4.2 4.4  CL 107 108 105 104  CO2 20* 23 23 25   GLUCOSE 119* 98 119* 103*  BUN 33* 34* 35* 31*  CREATININE 0.75 0.77 0.71 0.79  CALCIUM 7.9* 8.0* 8.2* 8.6*  MG 2.4 2.2 2.1  --   PHOS 2.8 2.8 3.2  --      No results found for this or any previous visit (from the past 240 hours).   Radiology Studies: DG Abd Portable 1V-Small Bowel Obstruction Protocol-initial, 8 hr delay Result Date: 02/18/2024 CLINICAL DATA:  8 hour delay small bowel obstruction EXAM: PORTABLE ABDOMEN - 1 VIEW COMPARISON:  Abdominal x-ray 02/18/2024 FINDINGS: Enteric tube tip is in the fundus of the stomach with sidehole at the level of the gastroesophageal junction. Contrast is seen throughout nondilated colon to the level  of the rectum. No dilated bowel loops are seen. IMPRESSION: 1. Enteric tube tip is in the fundus of the stomach with sidehole at the level of the gastroesophageal junction. Recommend advancement 8 cm. 2. Contrast is seen throughout nondilated colon to the level of the rectum. No evidence for bowel obstruction. Electronically Signed   By: Darliss Cheney M.D.   On: 02/18/2024 20:57    Scheduled Meds:  Chlorhexidine Gluconate Cloth  6 each Topical Daily   dexamethasone (DECADRON) injection  8 mg Intravenous Daily   insulin aspart  0-9 Units Subcutaneous Q8H   losartan  100 mg Per Tube q morning   pantoprazole (PROTONIX) IV  40 mg Intravenous Q12H   sodium chloride flush  10-40 mL Intracatheter Q12H   Continuous Infusions:  heparin 900 Units/hr (02/20/24 0852)   TPN ADULT (ION) 70 mL/hr at 02/20/24 0344   TPN ADULT (ION)       LOS: 16 days   Burnadette Pop, MD [facility] P2/23/2025, 10:49 AM

## 2024-02-20 NOTE — Progress Notes (Signed)
 PHARMACY - TOTAL PARENTERAL NUTRITION CONSULT NOTE   Indication: SBO  Assessment: 65 yoF admitted 2/7 after 2 weeks of progressively worsening reflux and abdominal distention with n/v. 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 Plan for bowel rest +/- venting G-tube while patient undergoes chemotherapy to hopefully relieve obstruction. Pharmacy consulted for TPN management.  Glucose / Insulin: no hx DM; CBG goal 100-180 - Negligible SSI use, even with daily dexamethasone on board >> CBGs stopped 2/20. BG WNL yesterday -Will resume CBG checks/sSSI q8h with plans to advance diet Electrolytes: No new labs. All stable WNL, including CorrCa (9.7) Renal: SCr stable WNL; BUN mildly elevated Hepatic: LFTs improving, Alk Phos elevated - Tbili remains stable WNL - TG WNL 2/17 I/O: Continues to have improving bowel function as noted 2/20 per GynOnc (flatus, small BMs) - NG removed 2/22 - Stool x2 - UOP: 600 mL + 2 unmeasured - no mIVF GI Imaging: - 2/7 CT a/p: distal SBO d/t diffuse peritoneal carcinomatosis, with transition point in RLQ, hepatic and retroperitoneal metastases; incidental finding of rectal intussusception - 2/11 AXR: persistent but significantly improved SB dilation when compared with prior - 2/13 AXR: Worsening small bowel obstruction.  - 2/14 MRI pelvis: Abnormal amorphous soft tissue throughout lower uterine segment and cervix with ileocolic and likely ovarian mets. GI Surgeries / Procedures: NA  Central access: PICC TPN start date: 2/11  Nutritional Goals: Goal TPN rate is 70 mL/hr (provides 89 g of protein and 1717 kcals per day)  RD Assessment: Estimated Needs Total Energy Estimated Needs: 1625-1900 kcal Total Protein Estimated Needs: 80-95g Total Fluid Estimated Needs: 1.9L/day  Current Nutrition:  TPN, Diet advanced to clear liquid on 2/22  Plan:  Continue custom TPN @ goal rate of 70 mL/hr Electrolytes in TPN: No changes. Na - 80 mEq/L K - 40 mEq/L Ca - 3 mEq/L Mg - 5  mEq/L Phos - 15 mmol/L Cl:Ac ratio - 1:2 Add standard MVI and trace elements to TPN Continue to hold chromium for national backorder mIVF per MD sSSI q8h Monitor TPN labs on Mon/Thurs  Cindi Carbon, PharmD 02/20/24 7:25 AM

## 2024-02-20 NOTE — Progress Notes (Signed)
 PHARMACY - ANTICOAGULATION CONSULT NOTE  Pharmacy Consult for heparin  Indication: acute pulmonary embolus and DVT  Allergies  Allergen Reactions   Calcium-Containing Compounds Other (See Comments)    Upsets the stomach, so it is not taken very often    Patient Measurements: Height: 5\' 4"  (162.6 cm) Weight: 56.1 kg (123 lb 10.9 oz) IBW/kg (Calculated) : 54.7 Heparin Dosing Weight: use total body weight   Vital Signs: Temp: 97.4 F (36.3 C) (02/23 0527) Temp Source: Oral (02/23 0527) BP: 132/64 (02/23 0527) Pulse Rate: 73 (02/23 0527)  Labs: Recent Labs    02/18/24 0554 02/19/24 0817 02/19/24 1819 02/20/24 0651  HGB 9.3* 9.9*  --  9.7*  HCT 30.5* 32.1*  --  31.2*  PLT 254 271  --  267  HEPARINUNFRC 0.53 0.87* 0.72* 0.77*  CREATININE  --  0.79  --   --     Estimated Creatinine Clearance: 43.6 mL/min (by C-G formula based on SCr of 0.79 mg/dL).  Assessment: 89 y/o F with metastatic ovarian cancer admitted with SBO secondary to peritoneal carcinomatosis. Chest CT on 2/14 showed incidental acute PE in the "the distal right pulmonary artery and lobar and segmental right upper lobe and right middle lobe pulmonary artery branches" with borderline elevated RV/LV ratio.  LE doppler on 02/12/24 positive for acute DVT in the left peroneal veins and left posterior tibial veins. Pharmacy consulted for IV heparin dosing for VTE treatment.  Significant events:  - 2/15: received carboplatin/paclitaxel   Today, 02/20/2024: Heparin level = 0.77 remains supratherapeutic despite decreasing rate of heparin infusion to 1000 units/hr UFH infusing via PICC, labs being obtained via peripheral stick CBC: Hgb low but stable, Plt WNL Note chemo received 2/15 No bleeding reported  Goal of Therapy:  Heparin level 0.3-0.7 units/ml Monitor platelets by anticoagulation protocol: Yes   Plan:  Reduce heparin to 900 units/hr Check heparin level 8 hours after rate change Daily CBC, heparin  level Monitor for signs of bleeding  Cindi Carbon, PharmD 02/20/24 7:18 AM

## 2024-02-21 DIAGNOSIS — I82492 Acute embolism and thrombosis of other specified deep vein of left lower extremity: Secondary | ICD-10-CM

## 2024-02-21 DIAGNOSIS — C786 Secondary malignant neoplasm of retroperitoneum and peritoneum: Secondary | ICD-10-CM | POA: Diagnosis not present

## 2024-02-21 DIAGNOSIS — C579 Malignant neoplasm of female genital organ, unspecified: Secondary | ICD-10-CM

## 2024-02-21 DIAGNOSIS — I2699 Other pulmonary embolism without acute cor pulmonale: Secondary | ICD-10-CM

## 2024-02-21 DIAGNOSIS — K56609 Unspecified intestinal obstruction, unspecified as to partial versus complete obstruction: Secondary | ICD-10-CM | POA: Diagnosis not present

## 2024-02-21 DIAGNOSIS — Z515 Encounter for palliative care: Secondary | ICD-10-CM | POA: Diagnosis not present

## 2024-02-21 LAB — CBC WITH DIFFERENTIAL/PLATELET
Abs Immature Granulocytes: 1.41 10*3/uL — ABNORMAL HIGH (ref 0.00–0.07)
Basophils Absolute: 0.1 10*3/uL (ref 0.0–0.1)
Basophils Relative: 1 %
Eosinophils Absolute: 0 10*3/uL (ref 0.0–0.5)
Eosinophils Relative: 0 %
HCT: 29.6 % — ABNORMAL LOW (ref 36.0–46.0)
Hemoglobin: 9.5 g/dL — ABNORMAL LOW (ref 12.0–15.0)
Immature Granulocytes: 11 %
Lymphocytes Relative: 13 %
Lymphs Abs: 1.6 10*3/uL (ref 0.7–4.0)
MCH: 30.8 pg (ref 26.0–34.0)
MCHC: 32.1 g/dL (ref 30.0–36.0)
MCV: 96.1 fL (ref 80.0–100.0)
Monocytes Absolute: 1.1 10*3/uL — ABNORMAL HIGH (ref 0.1–1.0)
Monocytes Relative: 9 %
Neutro Abs: 8.3 10*3/uL — ABNORMAL HIGH (ref 1.7–7.7)
Neutrophils Relative %: 66 %
Platelets: 247 10*3/uL (ref 150–400)
RBC: 3.08 MIL/uL — ABNORMAL LOW (ref 3.87–5.11)
RDW: 13.3 % (ref 11.5–15.5)
WBC: 12.6 10*3/uL — ABNORMAL HIGH (ref 4.0–10.5)
nRBC: 0.2 % (ref 0.0–0.2)

## 2024-02-21 LAB — COMPREHENSIVE METABOLIC PANEL
ALT: 59 U/L — ABNORMAL HIGH (ref 0–44)
AST: 30 U/L (ref 15–41)
Albumin: 2.5 g/dL — ABNORMAL LOW (ref 3.5–5.0)
Alkaline Phosphatase: 231 U/L — ABNORMAL HIGH (ref 38–126)
Anion gap: 8 (ref 5–15)
BUN: 38 mg/dL — ABNORMAL HIGH (ref 8–23)
CO2: 25 mmol/L (ref 22–32)
Calcium: 8.5 mg/dL — ABNORMAL LOW (ref 8.9–10.3)
Chloride: 103 mmol/L (ref 98–111)
Creatinine, Ser: 0.83 mg/dL (ref 0.44–1.00)
GFR, Estimated: >60 mL/min (ref >60)
Glucose, Bld: 109 mg/dL — ABNORMAL HIGH (ref 70–99)
Potassium: 4.4 mmol/L (ref 3.5–5.1)
Sodium: 136 mmol/L (ref 135–145)
Total Bilirubin: 0.4 mg/dL (ref 0.0–1.2)
Total Protein: 5.6 g/dL — ABNORMAL LOW (ref 6.5–8.1)

## 2024-02-21 LAB — GLUCOSE, CAPILLARY
Glucose-Capillary: 114 mg/dL — ABNORMAL HIGH (ref 70–99)
Glucose-Capillary: 148 mg/dL — ABNORMAL HIGH (ref 70–99)

## 2024-02-21 LAB — HEPARIN LEVEL (UNFRACTIONATED): Heparin Unfractionated: 0.64 [IU]/mL (ref 0.30–0.70)

## 2024-02-21 LAB — MAGNESIUM: Magnesium: 2 mg/dL (ref 1.7–2.4)

## 2024-02-21 LAB — TRIGLYCERIDES: Triglycerides: 103 mg/dL (ref <150)

## 2024-02-21 LAB — PHOSPHORUS: Phosphorus: 4 mg/dL (ref 2.5–4.6)

## 2024-02-21 MED ORDER — PROCHLORPERAZINE EDISYLATE 10 MG/2ML IJ SOLN: 10.0000 mg | Freq: Four times a day (QID) | INTRAMUSCULAR | Status: DC | PRN

## 2024-02-21 MED ORDER — TRAVASOL 10 % IV SOLN
INTRAVENOUS | Status: AC
  Filled 2024-02-21: qty 890.4

## 2024-02-21 MED ORDER — PROCHLORPERAZINE EDISYLATE 10 MG/2ML IJ SOLN
10.0000 mg | Freq: Four times a day (QID) | INTRAMUSCULAR | Status: DC
  Filled 2024-02-21: qty 2

## 2024-02-21 MED ORDER — PROCHLORPERAZINE EDISYLATE 10 MG/2ML IJ SOLN: 10.0000 mg | Freq: Four times a day (QID) | INTRAMUSCULAR | Status: AC | PRN

## 2024-02-21 NOTE — Progress Notes (Signed)
 Daily Progress Note   Patient Name: Tracy Preston       Date: 02/21/2024 DOB: 09-06-1935  Age: 89 y.o. MRN#: [id number] Attending Physician: Burnadette Pop, MD Primary Care Physician: Marden Noble, MD (Inactive) Admit Date: 02/04/2024 Length of Stay: 17 days  Reason for Consultation/Follow-up: Establishing goals of care  Subjective:   Resting in bed, Dr Venetia Maxon son in law  at bedside, no distress this am, events over the weekend noted, NGT is out, patient had nausea over the weekend.      Vital Signs:  BP (!) 106/56 (BP Location: Left Arm)   Pulse 69   Temp 97.7 F (36.5 C) (Oral)   Resp 16   Ht 5\' 4"  (1.626 m)   Wt 57.2 kg   SpO2 93%   BMI 21.65 kg/m   Physical Exam:  General: NAD,  Has NGT Cardiovascular: RRR, no edema in LE b/l Respiratory: no increased work of breathing noted, not in respiratory distress Skin: no rashes or lesions on visible skin Neuro: A&Ox4, following commands easily Psych: appropriately answers all questions Abdomen is distended Imaging: I personally reviewed recent imaging.   Assessment & Plan:   Assessment: Patient is a 89 year old female with past medical history of hypertension, CKD, and family history of breast cancer who was admitted on 02/04/2024 for management of abdominal distention and vomiting for approximately 2 weeks.  Imaging concerning for new metastatic gynecological malignancy with small bowel obstruction.  Imaging also showed likely metastatic disease to liver and peritoneal carcinomatosis.  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  Gyn Onc and oncology consulted for recommendations.  Palliative medicine team consulted to assist with complex medical decision making.   Recommendations/Plan: # Complex medical decision making/goals of care: -Interdisciplinary discussions between medical oncology hospital medicine and GYN oncology took place on 02-15-2024.  NGT is out, remains on TPN, continue to monitor.                   -  Code Status: Limited: Do not attempt resuscitation (DNR) -DNR-LIMITED -Do Not Intubate/DNI    # Symptom management Patient is receiving these palliative interventions for symptom management with an intent to improve quality of life.                 -Malignant small bowel obstruction secondary to peritoneal carcinomatosis                               -Patient received initial inpatient chemotherapy.  Receiving IV dexamethasone for symptom management - continue at current dose.       -Patient continuing to receive IV dexamethasone 8 mg daily.     # Psycho-social/Spiritual Support:  - Support System: daughters, son-in-law, 8 grandchildren    # Discharge Planning:  To Be Determined  -Have placed outpatient PMT referral to follow-up at [facility] with hope patient will be able to complete this.  Discussed with:patient  and Dr Venetia Maxon - son in law.  Also discussed in secure chat in an interdisciplinary manner with surgery med onc and gyn onc colleagues. PMT to follow.   Thank you for allowing the palliative care team to participate in the care Tracy Preston.  mod MDM Rosalin Hawking MD Palliative Care Provider PMT # [phone]  If patient remains symptomatic despite maximum doses, please call PMT at [phone] between 0700 and 1900. Outside of these hours, please call attending, as PMT does not have night  coverage.

## 2024-02-21 NOTE — Progress Notes (Signed)
 GYN Onc Progress Note  Subjective: Patient reports + flatus and having loose stools. She has had nausea today that has improved with meds. No emesis. Had ice and jello after NG tube removal then developed abdominal distention. Intermittent belching but not significant amount per pt. Over the past two days, she has had a small amount of ginger ale over ice, mango popiscles. No pain reported. She feels the abdominal distention was worse yesterday. She has been ambulating without difficulty. No concerns voiced.  Objective: Vital signs in last 24 hours: Temp:  [97.5 F (36.4 C)-97.7 F (36.5 C)] 97.7 F (36.5 C) (02/24 0539) Pulse Rate:  [69-77] 69 (02/24 0539) Resp:  [16-18] 16 (02/24 0539) BP: (106-129)/(55-72) 106/56 (02/24 0539) SpO2:  [93 %-98 %] 93 % (02/24 0539) Weight:  [126 lb 1.7 oz (57.2 kg)] 126 lb 1.7 oz (57.2 kg) (02/24 0500) Last BM Date : 02/20/24  Intake/Output from previous day: 02/23 0701 - 02/24 0700 In: 1786.5 [P.O.:677; I.V.:1109.5] Out: 300 [Urine:300]  Physical Examination (performed by Dr. [name]): Gen: NAD, alert and oriented;  Pulm: Breathing unlabored Abd: soft, moderately distended, active BS, nontender, tympanic Ext: warm and well perfused, no edema or tenderness to palpation  Labs:    Latest Ref Rng & Units 02/21/2024    3:23 AM 02/20/2024    6:51 AM 02/19/2024    8:17 AM  CBC  WBC 4.0 - 10.5 K/uL 12.6  13.0  18.3   Hemoglobin 12.0 - 15.0 g/dL 9.5  9.7  9.9   Hematocrit 36.0 - 46.0 % 29.6  31.2  32.1   Platelets 150 - 400 K/uL 247  267  271       Latest Ref Rng & Units 02/21/2024    3:23 AM 02/19/2024    8:17 AM 02/17/2024    5:52 AM  BMP  Glucose 70 - 99 mg/dL 403  474  259   BUN 8 - 23 mg/dL 38  31  35   Creatinine 0.44 - 1.00 mg/dL 5.63  8.75  6.43   Sodium 135 - 145 mmol/L 136  137  137   Potassium 3.5 - 5.1 mmol/L 4.4  4.4  4.2   Chloride 98 - 111 mmol/L 103  104  105   CO2 22 - 32 mmol/L 25  25  23    Calcium 8.9 - 10.3 mg/dL 8.5  8.6   8.2    Echocardiogram performed 02/13/2024: EF 65-70%  Doppler from 02/12/24 with +DVT in LLE  Small bowel imaging on 02/18/2024. NG removed 02/19/24.  Assessment: 89 y.o. 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 Truett Perna. She has received granix as well. PE diagnosed on staging CT chest from 02/11/2024-currently on heparin drip.   Metastatic cancer, favored to represent ovarian or primary peritoneal cancer rather than endometrial.  She has a pathologically enlarged left axillary lymph node which they had discussed potentially biopsying both for additional tissue diagnosis and next generation sequencing. After discussion, patient would like to hold on this.  Malignant bowel obstruction: Agree with plan for CT scan to further evaluate given nausea, distention. On clears. Having intermittent nausea. Can work towards full liquids when adequately passing flatus and having stools, nausea improved. Possibility of future G-tube placement had been previously discussed if unable to tolerate diet.  Pulmonary embolism: Dr. Pricilla Holm previously discussed with the patient hypercoagulable state that cancer causes. Doppler from 02/12/2024 +for DVT in LLE. Recommend  no use of SCDs.  Patient started on heparin drip 02/11/24.  Goal would be to transition to DOAC once she is able to take p.o. and ready for discharge.   LOS: 17 days    Doylene Bode 02/21/2024, 1:18 PM

## 2024-02-21 NOTE — Plan of Care (Signed)
  Problem: Education: Goal: Knowledge of General Education information will improve Description: Including pain rating scale, medication(s)/side effects and non-pharmacologic comfort measures Outcome: Progressing   Problem: Health Behavior/Discharge Planning: Goal: Ability to manage health-related needs will improve Outcome: Progressing   Problem: Clinical Measurements: Goal: Ability to maintain clinical measurements within normal limits will improve Outcome: Progressing Goal: Will remain free from infection Outcome: Progressing Goal: Diagnostic test results will improve Outcome: Progressing   Problem: Activity: Goal: Risk for activity intolerance will decrease Outcome: Progressing   Problem: Nutrition: Goal: Adequate nutrition will be maintained Outcome: Progressing   Problem: Pain Managment: Goal: General experience of comfort will improve and/or be controlled Outcome: Progressing   Problem: Safety: Goal: Ability to remain free from injury will improve Outcome: Progressing   Problem: Skin Integrity: Goal: Risk for impaired skin integrity will decrease Outcome: Progressing

## 2024-02-21 NOTE — Progress Notes (Signed)
 Chaplain met with Tracy Preston to introduce spiritual care services.  She reported that she is feeling pretty well, given everything that is going on.  She has a strong support system from family and doesn't have any particular needs right now, but she is open to chaplain checking in later in the week.  [address] Bcc Pager, [phone]

## 2024-02-21 NOTE — Progress Notes (Addendum)
 PROGRESS NOTE  Tracy Preston  ONG:[id number] DOB: Jun 19, 1935 DOA: 02/04/2024 PCP: Marden Noble, MD (Inactive)   Brief Narrative: Patient is a 89 year old female with history of hypertension, breast cancer who presented with abdominal distention, vomiting for 2 weeks.  CT abdomen was concerning for new metastatic disease from possible GYN origin, SBO.  Further workup showed peritoneal carcinomatosis with likely GYN malignancy.  GYN oncology, general surgery consulted pelvic MRI showed advanced GYN malignancy.  CT chest with contrast showed distal pulmonary present with cor pulmonale, started on heparin drip.  Received first cycle of chemo on 2/15.  Hospital course remarkable for persistent bowel obstruction, started on TPN, Decadron.  Palliative care following.  Now having bowel movements.  Small bowel SBO protocol shows nondilated colon with contrast on entire colon. NG tube removed on 2/22, started on clears.  After that she developed intermittent nausea, andominal distention.  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  CT chest with contrast showed distal PE, started on heparin drip.  Received first cycle of chemo on 2/15.   Hospital course remarkable for persistent bowel obstruction, started on TPN, Decadron. 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  Small bowel SBO protocol on 2/22 showed nondilated colon with contrast on entire colon. NG tube removed.  She is having bowel movements and passing gas but has intermittent nausea, some abdominal distention.  Plan for follow-up CT imaging soon  If she continues not to tolerate the diet then she may need a venting PEG tube  Right-sided distal PE: Started on heparin drip.  Lower EXTR Doppler positive for DVT.  Eventual plan is to transition to Eliquis.  Echo showed preserved EF  Leukocytosis: She received Granix by oncology. Improving.   No fever.  Poor nutritional status/severe protein calorie malnutrition: On TPN  Hypertension: On losartan.  Continue as needed medications for severe hypertension  GERD: Continue PPI  Normocytic anemia: Currently hemoglobin stable  Goals of care:Palliative care was following for goals of care .CODE STATUS DNR   Nutrition Problem: Moderate Malnutrition Etiology: acute illness    DVT prophylaxis:iv heparin     Code Status: Limited: Do not attempt resuscitation (DNR) -DNR-LIMITED -Do Not Intubate/DNI   Family Communication:  discussed with son-in-law Dr. Venetia Maxon on at bedside on 2/24   Patient status:Inpatient  Patient is from :Home  Anticipated discharge GM:[facility]  Estimated DC date:After oncology clearance, tolerance of diet   Consultants: General Surgery, palliative care, oncology  Procedures: None yet  Antimicrobials:  Anti-infectives (From admission, onward)    None       Subjective: Patient seen and examined at bedside today.  She again had bowel movement this morning.  Passing gas.  Nausea little better than yesterday.  Has mild distention of the abdomen.  No report of  abdominal pain  Objective: Vitals:   02/20/24 1323 02/20/24 2100  02/21/24 0500 02/21/24 0539  BP: (!) 108/55 129/72  (!) 106/56  Pulse: 73 77  69  Resp: 16 18  16   Temp: (!) 97.5 F (36.4 C) 97.7 F (36.5 C)  97.7 F (36.5 C)  TempSrc: Oral Oral  Oral  SpO2: 94% 98%  93%  Weight:   57.2 kg   Height:        Intake/Output Summary (Last 24 hours) at 02/21/2024 1136 Last data filed at 02/21/2024 1100 Gross per 24 hour  Intake 1739.45 ml  Output 400 ml  Net 1339.45 ml   Filed Weights   02/18/24 0715 02/19/24 0713 02/21/24 0500  Weight: 57.5 kg 56.1 kg 57.2 kg    Examination:   General exam: Overall comfortable, not in distress, pleasant elderly female HEENT: PERRL Respiratory system:  no wheezes or crackles  Cardiovascular system: S1 & S2 heard, RRR.  Gastrointestinal system: Abdomen is mildly distended, soft and nontender.  Bowel sounds present Central nervous system: Alert and oriented Extremities: No edema, no clubbing ,no cyanosis, PICC line Skin: No rashes, no ulcers,no icterus      Data Reviewed: I have personally reviewed following labs and imaging studies  CBC: Recent Labs  Lab 02/17/24 0552 02/18/24 0554 02/19/24 0817 02/20/24 0651 02/21/24 0323  WBC 26.4* 23.5* 18.3* 13.0* 12.6*  NEUTROABS 21.3* 19.5* 13.0* 10.7* 8.3*  HGB 9.4* 9.3* 9.9* 9.7* 9.5*  HCT 30.5* 30.5* 32.1* 31.2* 29.6*  MCV 99.7 101.0* 98.5 98.4 96.1  PLT 252 254 271 267 247   Basic Metabolic Panel: Recent Labs  Lab 02/15/24 0515 02/17/24 0552 02/19/24 0817 02/21/24 0323  NA 136 137 137 136  K 4.3 4.2 4.4 4.4  CL 108 105 104 103  CO2 23 23 25 25   GLUCOSE 98 119* 103* 109*  BUN 34* 35* 31* 38*  CREATININE 0.77 0.71 0.79 0.83  CALCIUM 8.0* 8.2* 8.6* 8.5*  MG 2.2 2.1  --  2.0  PHOS 2.8 3.2  --  4.0     No results found for this or any previous visit (from the past 240 hours).   Radiology Studies: No results found.   Scheduled Meds:  Chlorhexidine Gluconate Cloth  6 each Topical Daily   dexamethasone (DECADRON) injection  8 mg  Intravenous Daily   insulin aspart  0-9 Units Subcutaneous Q8H   losartan  100 mg Oral q morning   pantoprazole (PROTONIX) IV  40 mg Intravenous Q12H   prochlorperazine  10 mg Intravenous Q6H   sodium chloride flush  10-40 mL Intracatheter Q12H   Continuous Infusions:  heparin 900 Units/hr (02/21/24 0530)   TPN ADULT (ION) 70 mL/hr at 02/21/24 0424   TPN ADULT (ION)       LOS: 17 days   Burnadette Pop, MD [facility] P2/24/2025, 11:36 AM

## 2024-02-21 NOTE — Progress Notes (Signed)
 PHARMACY - ANTICOAGULATION CONSULT NOTE  Pharmacy Consult for heparin  Indication: acute pulmonary embolus and DVT  Allergies  Allergen Reactions   Calcium-Containing Compounds Other (See Comments)    Upsets the stomach, so it is not taken very often    Patient Measurements: Height: 5\' 4"  (162.6 cm) Weight: 56.1 kg (123 lb 10.9 oz) IBW/kg (Calculated) : 54.7 Heparin Dosing Weight: use total body weight   Vital Signs: Temp: 97.7 F (36.5 C) (02/23 2100) Temp Source: Oral (02/23 2100) BP: 129/72 (02/23 2100) Pulse Rate: 77 (02/23 2100)  Labs: Recent Labs    02/19/24 0817 02/19/24 1819 02/20/24 0651 02/20/24 1653 02/21/24 0323  HGB 9.9*  --  9.7*  --  9.5*  HCT 32.1*  --  31.2*  --  29.6*  PLT 271  --  267  --  247  HEPARINUNFRC 0.87*   < > 0.77* 0.68 0.64  CREATININE 0.79  --   --   --  0.83   < > = values in this interval not displayed.    Estimated Creatinine Clearance: 42 mL/min (by C-G formula based on SCr of 0.83 mg/dL).  Assessment: 89 y/o F with metastatic ovarian cancer admitted with SBO secondary to peritoneal carcinomatosis. Chest CT on 2/14 showed incidental acute PE in the "the distal right pulmonary artery and lobar and segmental right upper lobe and right middle lobe pulmonary artery branches" with borderline elevated RV/LV ratio.  LE doppler on 02/12/24 positive for acute DVT in the left peroneal veins and left posterior tibial veins. Pharmacy consulted for IV heparin dosing for VTE treatment.  Significant events:  - 2/15: received carboplatin/paclitaxel   Today, 02/21/2024: Heparin level now therapeutic at 0.64 (higher end of normal range) on 900 units/hr UFH infusing via PICC, labs being obtained via peripheral stick CBC: Hgb low but stable, Plt WNL Note chemo received 2/15 No bleeding per RN  Goal of Therapy:  Heparin level 0.3-0.7 units/ml Monitor platelets by anticoagulation protocol: Yes   Plan:  Continue heparin infusion at 900  units/hr Daily CBC, heparin level Monitor for signs of bleeding   Arley Phenix RPh 02/21/2024, 4:04 AM

## 2024-02-21 NOTE — Progress Notes (Signed)
 PHARMACY - TOTAL PARENTERAL NUTRITION CONSULT NOTE   Indication: SBO  Assessment: 32 yoF admitted 2/7 after 2 weeks of progressively worsening reflux and abdominal distention with n/v. 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 Plan for bowel rest +/- venting G-tube while patient undergoes chemotherapy to hopefully relieve obstruction. Pharmacy consulted for TPN management.  Glucose / Insulin: no hx DM; CBG goal 100-180 -2 units of insulin given in past 24 hours  Electrolytes: No new labs. All stable WNL, including CorrCa (9.7) Renal: SCr stable WNL; BUN elevated Hepatic: LFTs improving, Alk Phos elevated - Tbili remains stable WNL - TG WNL 2/24 I/O: Continues to have improving bowel function as noted 2/20 per GynOnc (flatus, small BMs) - NG removed 2/22 - Stool x3 - UOP: 300 mL + 3 unmeasured - no mIVF GI Imaging: - 2/7 CT a/p: distal SBO d/t diffuse peritoneal carcinomatosis, with transition point in RLQ, hepatic and retroperitoneal metastases; incidental finding of rectal intussusception - 2/11 AXR: persistent but significantly improved SB dilation when compared with prior - 2/13 AXR: Worsening small bowel obstruction.  - 2/14 MRI pelvis: Abnormal amorphous soft tissue throughout lower uterine segment and cervix with ileocolic and likely ovarian mets. GI Surgeries / Procedures: NA  Central access: PICC TPN start date: 2/11  Nutritional Goals: Goal TPN rate is 70 mL/hr (provides 89 g of protein and 1717 kcals per day)  RD Assessment: Estimated Needs Total Energy Estimated Needs: 1625-1900 kcal Total Protein Estimated Needs: 80-95g Total Fluid Estimated Needs: 1.9L/day  Current Nutrition:  TPN, Diet advanced to clear liquid on 2/22  Plan:  Continue custom TPN @ goal rate of 70 mL/hr Electrolytes in TPN: No changes. Na - 80 mEq/L K - 40 mEq/L Ca - 3 mEq/L Mg - 5 mEq/L Phos - 15 mmol/L Cl:Ac ratio - 1:2 Add standard MVI and trace elements to TPN Continue to hold chromium for  national backorder mIVF per MD sSSI q8h Monitor TPN labs on Mon/Thurs BMP, magnesium, phosphorus with AM labs     Adalberto Cole, PharmD, BCPS 02/21/2024 10:57 AM

## 2024-02-21 NOTE — Progress Notes (Addendum)
 Tracy Preston   DOB:Jul 14, 1935   IH#:[id number]      ASSESSMENT & PLAN:  1.  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 - Newly diagnosed February 2025 - CT scan 02/04/2024 shows diffuse peritoneal carcinomatosis and mild ascites. - Pelvic ultrasound 02/05/2024 shows 2 cm heterogeneous area at the uterine fundus, endometrial malignancy not excluded. - S/p paracentesis 02/05/2024, 100 mL of yellow fluid removed. 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 -MRI pelvis and CT chest for staging done on 02/11/2024.  Shows abnormal soft tissue throughout the lower uterine and cervix could represent primary uterine/cervical carcinoma or mets. - Status post chemotherapy with carboplatin and Taxol initiated on 02/12/2024.  Tolerated well. - Status post Granix 300 daily x 5 completed 02/17/2024.  High WBC count due to Granix is resolving nicely, 12.6 today.     - GYN Onc following closely.  Consideration for starting octreotide. - Palliative team also following - Medical oncology/Dr. Truett Perna following closely.   2.  Small bowel obstruction - Distal SBO with transition point in the RLQ   - Likely due to peritoneal carcinomatosis - NGT was removed 2 days ago.   - TPN infusing well - Management per surgery   3.  Acute pulmonary embolism - Per CT of the chest for staging purposes done on 02/11/2024.  This showed incidental acute PE involving the distal right pulmonary artery and lobar and segmental RUL and RML pulmonary artery branches.  Small right pleural effusion. - Remains on IV heparin, continue per protocol - Monitor closely for active bleeding   4.  Anemia, normocytic - HGB stable 9.5 today    - Transfuse PRBC for hemoglobin <7.0. - No transfusional intervention warranted at this time. -Monitor CBC with differential every 2 days   5.   Nausea/vomiting - Complain of nausea with vomiting over the weekend, reports none overnight. - Administer antiemetics Compazine as needed and Zofran ordered  6.  Hypertension - On losartan at home -BP stable today - Monitor BP counts - Primary team following      Code Status DNR-limited  Subjective:  Patient seen awake and alert ambulating in hallway.  NG tube has been removed.  TPN continues to infuse well.  Reports ongoing abdominal distention.  No other acute complaints offered.  Objective:  Vitals:   02/20/24 2100 02/21/24 0539  BP: 129/72 (!) 106/56  Pulse: 77 69  Resp: 18 16  Temp: 97.7 F (36.5 C) 97.7 F (36.5 C)  SpO2: 98% 93%     Intake/Output Summary (Last 24 hours) at 02/21/2024 8756 Last data filed at 02/21/2024 0533 Gross per 24 hour  Intake 1549.45 ml  Output 300 ml  Net 1249.45 ml     REVIEW OF SYSTEMS:   Constitutional: Denies fevers, chills or abnormal night sweats Eyes: Denies blurriness of vision, double vision or watery eyes Ears, nose, mouth, throat, and face: Denies mucositis or sore throat Respiratory: Denies cough, dyspnea or wheezes Cardiovascular: Denies palpitation, chest discomfort or lower extremity swelling Gastrointestinal: + Abdominal distention Skin: Denies abnormal skin rashes Lymphatics: Denies new lymphadenopathy or easy bruising Neurological: Denies numbness, tingling or new weaknesses Behavioral/Psych: Mood is stable, no new changes  All other systems were reviewed with the patient and are negative.  PHYSICAL EXAMINATION: ECOG PERFORMANCE STATUS: 2 - Symptomatic, <50% confined to bed  Vitals:   02/20/24 2100 02/21/24 0539  BP: 129/72 (!) 106/56  Pulse: 77 69  Resp: 18 16  Temp: 97.7 F (36.5 C) 97.7 F (36.5 C)  SpO2: 98% 93%   Filed Weights   02/18/24 0715 02/19/24 0713 02/21/24 0500  Weight: 126 lb 12.2 oz (57.5 kg) 123 lb 10.9 oz (56.1 kg) 126 lb 1.7 oz (57.2 kg)    GENERAL: alert, no distress and  comfortable SKIN: +Pale skin color, texture, turgor are normal, no rashes or significant lesions EYES: normal, conjunctiva are pink and non-injected, sclera clear OROPHARYNX: no exudate, no erythema and lips, buccal mucosa, and tongue normal  NECK: supple, thyroid normal size, non-tender, without nodularity LYMPH: no palpable lymphadenopathy in the cervical, axillary or inguinal LUNGS: clear to auscultation and percussion with normal breathing effort HEART: regular rate & rhythm and no murmurs and no lower extremity edema ABDOMEN: abdomen soft, non-tender and normal bowel sounds MUSCULOSKELETAL: no cyanosis of digits and no clubbing  PSYCH: alert & oriented x 3 with fluent speech NEURO: no focal motor/sensory deficits   All questions were answered. The patient knows to call the clinic with any problems, questions or concerns.   The total time spent in the appointment was 40 minutes encounter with patient including review of chart and various tests results, discussions about plan of care and coordination of care plan  Dawson Bills, NP 02/21/2024 9:56 AM    Labs Reviewed:  Lab Results  Component Value Date   WBC 12.6 (H) 02/21/2024   HGB 9.5 (L) 02/21/2024   HCT 29.6 (L) 02/21/2024   MCV 96.1 02/21/2024   PLT 247 02/21/2024   Recent Labs    02/17/24 0552 02/19/24 0817 02/21/24 0323  NA 137 137 136  K 4.2 4.4 4.4  CL 105 104 103  CO2 23 25 25   GLUCOSE 119* 103* 109*  BUN 35* 31* 38*  CREATININE 0.71 0.79 0.83  CALCIUM 8.2* 8.6* 8.5*  GFRNONAA >60 >60 >60  PROT 5.3* 6.0* 5.6*  ALBUMIN 2.5* 2.6* 2.5*  AST 62* 37 30  ALT 110* 79* 59*  ALKPHOS 184* 246* 231*  BILITOT 0.4 0.3 0.4    Studies Reviewed:  DG Abd Portable 1V-Small Bowel Obstruction Protocol-initial, 8 hr delay Result Date: 02/18/2024 CLINICAL DATA:  8 hour delay small bowel obstruction EXAM: PORTABLE ABDOMEN - 1 VIEW COMPARISON:  Abdominal x-ray 02/18/2024 FINDINGS: Enteric tube tip is in the fundus of the  stomach with sidehole at the level of the gastroesophageal junction. Contrast is seen throughout nondilated colon to the level of the rectum. No dilated bowel loops are seen. IMPRESSION: 1. Enteric tube tip is in the fundus of the stomach with sidehole at the level of the gastroesophageal junction. Recommend advancement 8 cm. 2. Contrast is seen throughout nondilated colon to the level of the rectum. No evidence for bowel obstruction. Electronically Signed   By: Darliss Cheney M.D.   On: 02/18/2024 20:57   DG Abd Portable 1V-Small Bowel Protocol-Position Verification Result Date: 02/18/2024 CLINICAL DATA:  [id number] Encounter for imaging study to confirm nasogastric (NG) tube placement [id number] EXAM: PORTABLE ABDOMEN - 1 VIEW COMPARISON:  02/10/2024 abdominal radiograph FINDINGS: Enteric tube terminates in the proximal stomach. No dilated small bowel loops. No evidence of pneumatosis or pneumoperitoneum. Mild elevation of the left hemidiaphragm with clear lung bases. IMPRESSION: Enteric tube terminates in the proximal stomach. Electronically Signed   By: Delbert Phenix M.D.   On: 02/18/2024 13:42   ECHOCARDIOGRAM COMPLETE Result Date: 02/13/2024    ECHOCARDIOGRAM REPORT   Patient Name:  Tracy Preston Date of Exam: 02/13/2024 Medical Rec #:  [id number]          Height:       64.0 in Accession #:    [id number]         Weight:       118.9 lb Date of Birth:  1935-12-15          BSA:          1.568 m Patient Age:    88 years           BP:           142/72 mmHg Patient Gender: F                  HR:           71 bpm. Exam Location:  Inpatient Procedure: 2D Echo, Cardiac Doppler and Color Doppler (Both Spectral and Color            Flow Doppler were utilized during procedure). Indications:    Cardiomyopathy- Unspecified I42.9  History:        Patient has no prior history of Echocardiogram examinations.                 Risk Factors:Hypertension.  Sonographer:    Lucendia Herrlich RCS Referring Phys: [id number] Doreen Salvage AMIN  IMPRESSIONS  1. Left ventricular ejection fraction, by estimation, is 65 to 70%. The left ventricle has normal function. The left ventricle has no regional wall motion abnormalities. Left ventricular diastolic parameters were normal.  2. Right ventricular systolic function is normal. The right ventricular size is normal. There is normal pulmonary artery systolic pressure. The estimated right ventricular systolic pressure is 23.8 mmHg.  3. The mitral valve is grossly normal. No evidence of mitral valve regurgitation. No evidence of mitral stenosis.  4. The aortic valve is calcified. Aortic valve regurgitation is trivial. Aortic valve sclerosis/calcification is present, without any evidence of aortic stenosis.  5. The inferior vena cava is normal in size with greater than 50% respiratory variability, suggesting right atrial pressure of 3 mmHg. FINDINGS  Left Ventricle: Left ventricular ejection fraction, by estimation, is 65 to 70%. The left ventricle has normal function. The left ventricle has no regional wall motion abnormalities. Strain imaging was not performed. The left ventricular internal cavity  size was normal in size. There is no left ventricular hypertrophy. Left ventricular diastolic parameters were normal. Right Ventricle: The right ventricular size is normal. No increase in right ventricular wall thickness. Right ventricular systolic function is normal. There is normal pulmonary artery systolic pressure. The tricuspid regurgitant velocity is 2.28 m/s, and  with an assumed right atrial pressure of 3 mmHg, the estimated right ventricular systolic pressure is 23.8 mmHg. Left Atrium: Left atrial size was normal in size. Right Atrium: Right atrial size was normal in size. Pericardium: There is no evidence of pericardial effusion. Mitral Valve: The mitral valve is grossly normal. No evidence of mitral valve regurgitation. No evidence of mitral valve stenosis. Tricuspid Valve: The tricuspid valve is grossly  normal. Tricuspid valve regurgitation is trivial. No evidence of tricuspid stenosis. Aortic Valve: The aortic valve is calcified. Aortic valve regurgitation is trivial. Aortic regurgitation PHT measures 299 msec. Aortic valve sclerosis/calcification is present, without any evidence of aortic stenosis. Aortic valve mean gradient measures 9.0 mmHg. Aortic valve peak gradient measures 11.1 mmHg. Aortic valve area, by VTI measures 1.23 cm. Pulmonic Valve: The pulmonic valve was grossly normal. Pulmonic valve regurgitation  is trivial. No evidence of pulmonic stenosis. Aorta: The aortic root and ascending aorta are structurally normal, with no evidence of dilitation. Venous: The inferior vena cava is normal in size with greater than 50% respiratory variability, suggesting right atrial pressure of 3 mmHg. IAS/Shunts: The atrial septum is grossly normal. Additional Comments: 3D imaging was not performed. Mild ascites is present.  LEFT VENTRICLE PLAX 2D LVIDd:         4.00 cm   Diastology LVIDs:         2.60 cm   LV e' medial:    9.25 cm/s LV PW:         0.90 cm   LV E/e' medial:  6.4 LV IVS:        1.00 cm   LV e' lateral:   12.20 cm/s LVOT diam:     1.80 cm   LV E/e' lateral: 4.9 LV SV:         48 LV SV Index:   31 LVOT Area:     2.54 cm  RIGHT VENTRICLE             IVC RV S prime:     11.00 cm/s  IVC diam: 1.20 cm TAPSE (M-mode): 1.8 cm LEFT ATRIUM             Index        RIGHT ATRIUM           Index LA diam:        3.70 cm 2.36 cm/m   RA Area:     12.50 cm LA Vol (A2C):   29.1 ml 18.55 ml/m  RA Volume:   29.10 ml  18.55 ml/m LA Vol (A4C):   40.0 ml 25.50 ml/m LA Biplane Vol: 34.3 ml 21.87 ml/m  AORTIC VALVE                     PULMONIC VALVE AV Area (Vmax):    1.61 cm      PR End Diast Vel: 2.12 msec AV Area (Vmean):   1.25 cm AV Area (VTI):     1.23 cm AV Vmax:           166.67 cm/s AV Vmean:          138.000 cm/s AV VTI:            0.392 m AV Peak Grad:      11.1 mmHg AV Mean Grad:      9.0 mmHg LVOT Vmax:          105.50 cm/s LVOT Vmean:        67.950 cm/s LVOT VTI:          0.190 m LVOT/AV VTI ratio: 0.48 AI PHT:            299 msec  AORTA Ao Root diam: 3.10 cm Ao Asc diam:  2.80 cm MITRAL VALVE               TRICUSPID VALVE MV Area (PHT): 3.27 cm    TR Peak grad:   20.8 mmHg MV Decel Time: 232 msec    TR Vmax:        228.00 cm/s MV E velocity: 59.60 cm/s MV A velocity: 76.60 cm/s  SHUNTS MV E/A ratio:  0.78        Systemic VTI:  0.19 m  Systemic Diam: 1.80 cm Lennie Odor MD Electronically signed by Lennie Odor MD Signature Date/Time: 02/13/2024/8:53:15 PM    Final    VAS [place] LOWER EXTREMITY VENOUS (DVT) Result Date: 02/12/2024  Lower Venous DVT Study Patient Name:  Tracy Preston  Date of Exam:   02/12/2024 Medical Rec #: [id number]           Accession #:    [id number] Date of Birth: 03-May-1935           Patient Gender: F Patient Age:   69 years Exam Location:  [facility] Procedure:      VAS [place] LOWER EXTREMITY VENOUS (DVT) Referring Phys: Stephania Fragmin --------------------------------------------------------------------------------  Indications: Pulmonary embolism.  Risk Factors: Newly diagnosed peritoneal carcinomatosis. Comparison Study: No previous exams Performing Technologist: Jody Hill RVT, RDMS  Examination Guidelines: A complete evaluation includes B-mode imaging, spectral Doppler, color Doppler, and power Doppler as needed of all accessible portions of each vessel. Bilateral testing is considered an integral part of a complete examination. Limited examinations for reoccurring indications may be performed as noted. The reflux portion of the exam is performed with the patient in reverse Trendelenburg.  +---------+---------------+---------+-----------+----------+--------------+ RIGHT    CompressibilityPhasicitySpontaneityPropertiesThrombus Aging +---------+---------------+---------+-----------+----------+--------------+ CFV      Full           Yes      No                                   +---------+---------------+---------+-----------+----------+--------------+ SFJ      Full                                                        +---------+---------------+---------+-----------+----------+--------------+ FV Prox  Full           Yes      Yes                                 +---------+---------------+---------+-----------+----------+--------------+ FV Mid   Full           Yes      Yes                                 +---------+---------------+---------+-----------+----------+--------------+ FV DistalFull           Yes      Yes                                 +---------+---------------+---------+-----------+----------+--------------+ PFV      Full                                                        +---------+---------------+---------+-----------+----------+--------------+ POP      Full           Yes      Yes                                 +---------+---------------+---------+-----------+----------+--------------+  PTV      Full                                                        +---------+---------------+---------+-----------+----------+--------------+ PERO     Full                                                        +---------+---------------+---------+-----------+----------+--------------+   +---------+---------------+---------+-----------+----------+--------------+ LEFT     CompressibilityPhasicitySpontaneityPropertiesThrombus Aging +---------+---------------+---------+-----------+----------+--------------+ CFV      Full           Yes      Yes                                 +---------+---------------+---------+-----------+----------+--------------+ SFJ      Full                                                        +---------+---------------+---------+-----------+----------+--------------+ FV Prox  Full           Yes      Yes                                  +---------+---------------+---------+-----------+----------+--------------+ FV Mid   Full           Yes      Yes                                 +---------+---------------+---------+-----------+----------+--------------+ FV DistalFull           Yes      Yes                                 +---------+---------------+---------+-----------+----------+--------------+ PFV      Full                                                        +---------+---------------+---------+-----------+----------+--------------+ POP      Full           Yes      Yes                                 +---------+---------------+---------+-----------+----------+--------------+ PTV      None           No       No                   Acute          +---------+---------------+---------+-----------+----------+--------------+ PERO     None  No       No                   Acute          +---------+---------------+---------+-----------+----------+--------------+     Summary: BILATERAL: -No evidence of popliteal cyst, bilaterally. RIGHT: - There is no evidence of deep vein thrombosis in the lower extremity.  LEFT: - Findings consistent with acute deep vein thrombosis involving the left peroneal veins, and left posterior tibial veins.   *See table(s) above for measurements and observations. Electronically signed by Carolynn Sayers on 02/12/2024 at 8:18:12 PM.    Final    CT CHEST W CONTRAST Addendum Date: 02/11/2024 ADDENDUM REPORT: 02/11/2024 15:54 ADDENDUM: Critical Value/emergent results were called by telephone at the time of interpretation on 02/11/2024 at 3:54 pm to provider Eugene Garnet, MD, who verbally acknowledged these results. Electronically Signed   By: Delbert Phenix M.D.   On: 02/11/2024 15:54   Result Date: 02/11/2024 CLINICAL DATA:  Peritoneal carcinomatosis. Chest staging. * Tracking Code: BO * EXAM: CT CHEST WITH CONTRAST TECHNIQUE: Multidetector CT imaging of the chest was performed  during intravenous contrast administration. RADIATION DOSE REDUCTION: This exam was performed according to the departmental dose-optimization program which includes automated exposure control, adjustment of the mA and/or kV according to patient size and/or use of iterative reconstruction technique. CONTRAST:  75mL OMNIPAQUE IOHEXOL 300 MG/ML  SOLN COMPARISON:  02/04/2024 CT abdomen/pelvis. 02/08/2024 chest radiograph. FINDINGS: Cardiovascular: Normal heart size. No significant pericardial effusion/thickening. Left anterior descending coronary atherosclerosis. Right PICC terminates in the middle third of the SVC. Atherosclerotic nonaneurysmal thoracic aorta. Incidental acute pulmonary embolism involving the distal right pulmonary artery and lobar and segmental right upper lobe and right middle lobe pulmonary artery branches. No saddle embolus. Normal caliber main pulmonary artery. RV/LV ratio 1.0. Mediastinum/Nodes: No significant thyroid nodules. Fluid level in the mid to lower thoracic esophagus with enteric tube terminating in the proximal stomach. No right axillary adenopathy. Mild left axillary adenopathy up to 1.1 cm (series 2/image 61). No pathologically enlarged mediastinal or hilar nodes. Lungs/Pleura: No pneumothorax. Small layering right pleural effusion. No left pleural effusion. Calcified 1 cm posterior right lower lobe granuloma. Mild dependent bilateral lower lobe atelectasis. No acute consolidative airspace disease, lung masses or significant pulmonary nodules. Upper abdomen: Moderate upper abdominal ascites with nodular soft tissue caking in the visualized anterior left upper peritoneum, as seen on recent CT abdomen study. Several hypodense liver lesions scattered throughout the visualized liver, unchanged, largest 1.6 cm in the inferior liver on series 2/image 155. Musculoskeletal: No aggressive appearing focal osseous lesions. Mild thoracic spondylosis. IMPRESSION: 1. Incidental acute pulmonary  embolism involving the distal right pulmonary artery and lobar and segmental right upper lobe and right middle lobe pulmonary artery branches. No saddle embolus. Normal caliber main pulmonary artery. Borderline elevated RV/LV Ratio = 1.0. 2. Small layering right pleural effusion. Mild dependent bilateral lower lobe atelectasis. 3. Mild left axillary adenopathy, cannot exclude metastatic disease. No other evidence of metastatic disease in the chest. 4. One vessel coronary atherosclerosis. 5. Moderate upper abdominal ascites with nodular soft tissue caking in the visualized anterior left upper peritoneum, as seen on recent CT abdomen study, compatible with peritoneal carcinomatosis. Several hypodense liver lesions scattered throughout the visualized liver, unchanged, suspicious for liver metastases. Electronically Signed: By: Delbert Phenix M.D. On: 02/11/2024 15:28   MR PELVIS W WO CONTRAST Result Date: 02/11/2024 CLINICAL DATA:  "Occult malignancy". Peritoneal carcinomatosis on abdominopelvic CT with bowel obstruction.  EXAM: MRI PELVIS WITHOUT AND WITH CONTRAST TECHNIQUE: Multiplanar multisequence MR imaging of the pelvis was performed both before and after administration of intravenous contrast. CONTRAST:  5mL GADAVIST GADOBUTROL 1 MMOL/ML IV SOLN COMPARISON:  CT of 02/04/2024 FINDINGS: Urinary Tract: Primarily decompressed urinary bladder. Left renal sinus cysts without hydronephrosis. Bowel: Fluid-filled small bowel loops within the upper and mid pelvis measure up to 2.0 cm on 19/17. Improved from 3.4 cm on the prior exam. Again identified is an intussusception of the sigmoid into the rectum. There is mild hyperenhancement within the lead point including on 41/17, without restricted diffusion in this area. Vascular/Lymphatic: Ileocolic mesenteric adenopathy including at 2.2 x 1.8 cm on 08/17. No pelvic sidewall adenopathy. Reproductive: The endometrium is thickened for age including at 1.0 cm on 18/4. This is  followed to the level of the cervix and lower uterine segment, where soft tissue fullness is again identified including on 18/4 and 21/3. Correlate restricted diffusion on 22/11. Example 3.7 x 3.7 cm on 31/17. There is amorphous soft tissue fullness within both adnexa, greater right than left. Example restricted diffusion on 17/11. The right adnexal soft tissue fullness measures 3.3 x 3.3 cm on 18/17. No separate ovarian tissue identified. Other: Peritoneal carcinomatosis, as evidenced by diffuse peritoneal enhancement and restricted diffusion (example 22/11). Small volume pelvic fluid. Musculoskeletal: No acute osseous abnormality. IMPRESSION: 1. Abnormal amorphous soft tissue throughout lower uterine segment and cervix could represent primary uterine/cervical carcinoma or metastatic disease. 2. Diffuse peritoneal carcinomatosis as better imaged on dedicated CT. Ileocolic mesenteric nodal metastasis. 3. Right greater than left adnexal soft tissue fullness likely represents ovarian metastasis. 4. Chronic or recurrent sigmoid/rectal intussusception since 02/04/2024. Vague hyperenhancement in the region of the lead point, without dominant mass. Recommend physical exam correlation to exclude either primary rectosigmoid carcinoma or serosal sigmoid implant lead point. 5. Small volume pelvic fluid. 6. Improved small bowel dilatation since prior CT. Electronically Signed   By: Jeronimo Greaves M.D.   On: 02/11/2024 15:42   DG Abd 1 View Result Date: 02/10/2024 CLINICAL DATA:  16109 Abdominal distention [id number] [phone] SBO (small bowel obstruction) (HCC) [id number] EXAM: ABDOMEN - 1 VIEW COMPARISON:  02/08/2024 FINDINGS: NG tube terminates within the proximal stomach. Worsening small bowel dilation in the mid abdomen measuring up to 5.3 cm (previously 3.3 cm. Paucity of bowel gas within the colon. No gross free intraperitoneal air. IMPRESSION: 1. Worsening small bowel obstruction. 2. NG tube terminates within the proximal stomach.  Recommend advancement. Electronically Signed   By: Duanne Guess D.O.   On: 02/10/2024 09:35   DG Chest 1 View Result Date: 02/08/2024 CLINICAL DATA:  PICC line placement EXAM: CHEST  1 VIEW COMPARISON:  02/05/2024 FINDINGS: Right PICC line in place with the tip in the SVC. NG tube is in the stomach. Heart and mediastinal contours within normal limits. No confluent opacities or effusions. No acute bony abnormality. IMPRESSION: Right PICC line tip in the SVC. No active cardiopulmonary disease. Electronically Signed   By: Charlett Nose M.D.   On: 02/08/2024 17:51   [place] EKG SITE RITE Result Date: 02/08/2024 If Site Rite image not attached, placement could not be confirmed due to current cardiac rhythm.  DG Abd Portable 1V Result Date: 02/08/2024 CLINICAL DATA:  Check gastric catheter placement EXAM: PORTABLE ABDOMEN - 1 VIEW COMPARISON:  02/04/2024 FINDINGS: Scattered large and small bowel gas is noted. Mild small bowel dilatation is noted significantly improved when compared with the prior exam. Gastric catheter is noted with  the tip in the stomach. No free air is noted. IMPRESSION: Persistent but significantly improved small bowel dilatation when compared with the prior exam. Electronically Signed   By: Alcide Clever M.D.   On: 02/08/2024 09:34   US Paracentesis Result Date: 02/05/2024 INDICATION: 89 year old with new abdominal distention, ascites. Request made for diagnostic and therapeutic paracentesis. EXAM: ULTRASOUND GUIDED DIAGNOSTIC  PARACENTESIS MEDICATIONS: 10 mL 1% lidocaine. COMPLICATIONS: None immediate. PROCEDURE: Informed written consent was obtained from the patient after a discussion of the risks, benefits and alternatives to treatment. A timeout was performed prior to the initiation of the procedure. Initial ultrasound scanning demonstrates a small amount of ascites within the left lateral abdomen interlooped with bowel. The left lateral abdomen was prepped and draped in the usual sterile  fashion. 1% lidocaine was used for local anesthesia. Following this, a 19 gauge, 7-cm, Yueh catheter was introduced. An ultrasound image was saved for documentation purposes. The paracentesis was performed. The catheter was removed and a dressing was applied. The patient tolerated the procedure well without immediate post procedural complication. FINDINGS: A total of approximately 100 mL of yellow fluid was removed. Samples were sent to the laboratory as requested by the clinical team. IMPRESSION: Successful ultrasound-guided paracentesis yielding 100 mL of peritoneal fluid. Performed by: Loyce Dys PA-C Electronically Signed   By: Malachy Moan M.D.   On: 02/05/2024 14:31   US PELVIC COMPLETE WITH TRANSVAGINAL Result Date: 02/05/2024 CLINICAL DATA:  89 year old female with ascites, peritoneal carcinomatosis on CT Abdomen and Pelvis yesterday. EXAM: TRANSABDOMINAL AND TRANSVAGINAL ULTRASOUND OF PELVIS TECHNIQUE: Both transabdominal and transvaginal ultrasound examinations of the pelvis were performed. Transabdominal technique was performed for global imaging of the pelvis including uterus, ovaries, adnexal regions, and pelvic cul-de-sac. It was necessary to proceed with endovaginal exam following the transabdominal exam to visualize the ovaries. COMPARISON:  CT Abdomen and Pelvis 02/04/2024. FINDINGS: Uterus Measurements: 6.9 x 3.9 x 3.9 cm = volume: 41 mL. No obvious myometrial mass. Endometrium Heterogeneous hypoechoic area at the fundus endometrium (image 49 of series 1). This area encompasses about 2 cm (image 55). See also cine series 3 images. Otherwise the echogenic endometrial stripe is up to 5 mm. Right ovary Measurements: 3.5 x 2.1 x 2.8 cm = volume: 11 mL. No ovarian mass identified. Left ovary Measurements: 2.3 x 1.5 x 1.7 cm = volume: 3 mL. No ovarian mass identified. Other findings Small volume ascites again visible in the pelvis. IMPRESSION: 1. No ovarian mass identified by ultrasound. 2.  There is a 2 cm heterogeneous area at the uterine fundus which is favored to be endometrial related. Elsewhere the endometrial stripe is about 5 mm. An Endometrial Malignancy is not excluded. 3. Ascites, in conjunction with the extensive additional abdominal and pelvic abnormalities demonstrated by CT yesterday. Electronically Signed   By: Odessa Fleming M.D.   On: 02/05/2024 12:17   DG Chest Portable 1 View Result Date: 02/05/2024 CLINICAL DATA:  NG tube replacement confirmation EXAM: PORTABLE CHEST - 1 VIEW COMPARISON:  02/04/2024 FINDINGS: Cardiomediastinal silhouette and pulmonary vasculature are within normal limits. Lungs are clear. Nasogastric tube extends to the left upper quadrant the side hole located at the level of the gastroesophageal junction. It is slightly retracted compared to prior exam. IMPRESSION: Nasogastric tube terminates in the left upper quadrant in the expected site of the stomach. The side hole of the NG tube is located at the level of the gastroesophageal junction. Electronically Signed   By: Mauri Reading  Mir  M.D.   On: 02/05/2024 07:38   DG Chest Portable 1 View Result Date: 02/04/2024 CLINICAL DATA:  NG tube placement. Abdominal discomfort, heartburn, and nausea. EXAM: PORTABLE CHEST 1 VIEW COMPARISON:  None Available. FINDINGS: The heart size and mediastinal contours are within normal limits. There is atherosclerotic calcification of the aorta. No consolidation, effusion, or pneumothorax. An enteric tube terminates in the stomach. No acute osseous abnormality. IMPRESSION: 1. No active disease. 2. Enteric tube terminates in the stomach. Electronically Signed   By: Thornell Sartorius M.D.   On: 02/04/2024 21:20   CT ABDOMEN PELVIS W CONTRAST Result Date: 02/04/2024 CLINICAL DATA:  Abdominal pain, bloating, nausea and vomiting for 2 weeks. * Tracking Code: BO * EXAM: CT ABDOMEN AND PELVIS WITH CONTRAST TECHNIQUE: Multidetector CT imaging of the abdomen and pelvis was performed using the standard  protocol following bolus administration of intravenous contrast. RADIATION DOSE REDUCTION: This exam was performed according to the departmental dose-optimization program which includes automated exposure control, adjustment of the mA and/or kV according to patient size and/or use of iterative reconstruction technique. CONTRAST:  OMNIPAQUE IOHEXOL 300 MG/ML  SOLN COMPARISON:  None Available. FINDINGS: Lower Chest: No acute findings. Hepatobiliary: A few tiny hepatic cysts are seen, however, there are multiple hypovascular masses involving the right and left hepatic lobes, largest in the central right hepatic lobe measuring 1.9 x 1.5 cm on image 26/2. These are consistent with liver metastases. Gallbladder is unremarkable. No evidence of biliary ductal dilatation. Pancreas:  No mass or inflammatory changes. Spleen: Within normal limits in size and appearance. Adrenals/Urinary Tract: No suspicious masses identified. No evidence of ureteral calculi or hydronephrosis. Stomach/Bowel: Distal small bowel obstruction, with transition point in the right lower quadrant, likely due to peritoneal carcinoma. Rectal intussusception incidentally noted. Vascular/Lymphatic: Mild retroperitoneal lymphadenopathy in the left para-aortic and aortocaval spaces. No acute vascular findings. Reproductive: A 1.6 cm peripherally calcified fibroid is seen in the uterine fundus. Poorly defined masslike soft tissue prominence is seen in the region of the cervix and lower uterine segment measuring 3.6 x 3.3 cm on image 63/2, possibly representing a primary cervical or endometrial carcinoma. Poorly defined soft tissue density is seen in both adnexal regions and along the uterine fundus, consistent with peritoneal carcinomatosis. Other: Mild ascites is seen. Peritoneal and omental soft tissue nodules and masses are seen throughout the abdomen and pelvis, consistent with peritoneal carcinomatosis. Largest conglomerate mass is seen in the right  abdomen measuring approximately 6.7 x 3.4 cm on image 37/2. Musculoskeletal:  No suspicious bone lesions identified. IMPRESSION: Diffuse peritoneal carcinomatosis and mild ascites. Distal small bowel obstruction, with transition point in the right lower quadrant, likely due to peritoneal carcinomatosis. Masslike soft tissue prominence in the region of the lower uterine segment and cervix, possibly representing primary cervical or endometrial carcinoma. Liver metastases. Mild retroperitoneal lymphadenopathy, consistent with metastatic disease. Rectal intussusception incidentally noted. No definite lead mass identified. Electronically Signed   By: Danae Orleans M.D.   On: 02/04/2024 11:34  Tracy Preston was interviewed and examined.  She is now at day 10 following cycle 1 Taxol/carboplatin.  The white count and platelets remain adequate.   The NG tube was removed over the weekend.  She is having bowel movements, but continues to have obstructive symptoms.  We will refer her a restaging CT to assess for a response to chemotherapy and persistent bowel obstruction.  Recommendations: Consider weaning the Decadron to off CT abdomen/pelvis Convert anticoagulation to apixaban if she continues to  tolerate a diet Molecular testing on the cytology specimen is pending  The case with Dr. Venetia Maxon at the bedside.

## 2024-02-21 NOTE — Progress Notes (Signed)
 Progress Note     Subjective: Pt reports she is having some bowel function, loose BMs. Nausea improved with scheduled compazine. Still has some abdominal distention but slightly improved. Only took in 2 mango popsicles and some ginger ale yesterday. Having some burping and hiccoughs. Her son in law was at bedside this AM. Discussed if not improving possibility of venting PEG vs gastrostomy for symptom relief. If needed then patient would prefer to avoid surgical g-tube if possible.   Objective: Vital signs in last 24 hours: Temp:  [97.5 F (36.4 C)-97.7 F (36.5 C)] 97.7 F (36.5 C) (02/24 0539) Pulse Rate:  [69-77] 69 (02/24 0539) Resp:  [16-18] 16 (02/24 0539) BP: (106-129)/(55-72) 106/56 (02/24 0539) SpO2:  [93 %-98 %] 93 % (02/24 0539) Weight:  [57.2 kg] 57.2 kg (02/24 0500) Last BM Date : 02/20/24  Intake/Output from previous day: 02/23 0701 - 02/24 0700 In: 1786.5 [P.O.:677; I.V.:1109.5] Out: 300 [Urine:300] Intake/Output this shift: No intake/output data recorded.  PE: General: pleasant, WD, WN female who is laying in bed in NAD Lungs: Respiratory effort nonlabored Abd: soft, NT, distended Psych: A&Ox3 with an appropriate affect.    Lab Results:  Recent Labs    02/20/24 0651 02/21/24 0323  WBC 13.0* 12.6*  HGB 9.7* 9.5*  HCT 31.2* 29.6*  PLT 267 247   BMET Recent Labs    02/19/24 0817 02/21/24 0323  NA 137 136  K 4.4 4.4  CL 104 103  CO2 25 25  GLUCOSE 103* 109*  BUN 31* 38*  CREATININE 0.79 0.83  CALCIUM 8.6* 8.5*   PT/INR No results for input(s): "LABPROT", "INR" in the last 72 hours. CMP     Component Value Date/Time   NA 136 02/21/2024 0323   K 4.4 02/21/2024 0323   CL 103 02/21/2024 0323   CO2 25 02/21/2024 0323   GLUCOSE 109 (H) 02/21/2024 0323   BUN 38 (H) 02/21/2024 0323   CREATININE 0.83 02/21/2024 0323   CALCIUM 8.5 (L) 02/21/2024 0323   PROT 5.6 (L) 02/21/2024 0323   ALBUMIN 2.5 (L) 02/21/2024 0323   AST 30 02/21/2024 0323    ALT 59 (H) 02/21/2024 0323   ALKPHOS 231 (H) 02/21/2024 0323   BILITOT 0.4 02/21/2024 0323   GFRNONAA >60 02/21/2024 0323   Lipase     Component Value Date/Time   LIPASE 29 02/04/2024 1619       Studies/Results: No results found.  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 Truett Perna with ONC following - Chemotherapy with carboplatin and Taxol initiated on 02/12/2024. Tolerated well. - On Granix 300 mcg daily x 5, completed therapy 02/17/2024.  WBC remains somewhat elevated, likely due to Granix.   - GYN-ONC and palliative following as well - agree with recommendation for repeat CT to get an updated picture  - if venting PEG tube indicated, pt would prefer to have this done in least invasive method possible - no emergent/urgent indications for surgical intervention, will follow up CT results and plans from there  FEN: CLD as tolerated, TPN VTE: heparin gtt ID: no current abx  - per TRH -  HTN Acute PE Normocytic anemia  LOS: 17 days   I reviewed Consultant medical oncology, GYN oncology and palliative notes, hospitalist notes, last 24 h vitals and pain scores, last 48 h intake and output, last 24 h labs and trends, and last 24 h imaging results.  This care required moderate level of medical decision making.    Juliet Rude, [facility] Surgery 02/21/2024, 9:40 AM Please see Amion for pager number during day hours 7:00am-4:30pm

## 2024-02-21 NOTE — Progress Notes (Signed)
 Mobility Specialist - Progress Note   02/21/24 1123  Mobility  Activity Ambulated with assistance in hallway  Level of Assistance Modified independent, requires aide device or extra time  Assistive Device Front wheel walker  Distance Ambulated (ft) 500 ft  Range of Motion/Exercises Active  Activity Response Tolerated well  Mobility Referral Yes  Mobility visit 1 Mobility  Mobility Specialist Start Time (ACUTE ONLY) 1110  Mobility Specialist Stop Time (ACUTE ONLY) 1123  Mobility Specialist Time Calculation (min) (ACUTE ONLY) 13 min   Pt was found in bed and agreeable to ambulate. No complaints with session. At EOS returned to bed with all needs met. Call bell in reach and RN in room.  Billey Chang Mobility Specialist

## 2024-02-22 ENCOUNTER — Inpatient Hospital Stay (HOSPITAL_COMMUNITY): Payer: Medicare PPO

## 2024-02-22 DIAGNOSIS — K56609 Unspecified intestinal obstruction, unspecified as to partial versus complete obstruction: Secondary | ICD-10-CM | POA: Diagnosis not present

## 2024-02-22 DIAGNOSIS — C786 Secondary malignant neoplasm of retroperitoneum and peritoneum: Secondary | ICD-10-CM | POA: Diagnosis not present

## 2024-02-22 DIAGNOSIS — I2699 Other pulmonary embolism without acute cor pulmonale: Secondary | ICD-10-CM | POA: Diagnosis not present

## 2024-02-22 DIAGNOSIS — C579 Malignant neoplasm of female genital organ, unspecified: Secondary | ICD-10-CM | POA: Diagnosis not present

## 2024-02-22 LAB — CBC WITH DIFFERENTIAL/PLATELET
Abs Immature Granulocytes: 1.57 10*3/uL — ABNORMAL HIGH (ref 0.00–0.07)
Basophils Absolute: 0.1 10*3/uL (ref 0.0–0.1)
Basophils Relative: 1 %
Eosinophils Absolute: 0.1 10*3/uL (ref 0.0–0.5)
Eosinophils Relative: 0 %
HCT: 28 % — ABNORMAL LOW (ref 36.0–46.0)
Hemoglobin: 8.8 g/dL — ABNORMAL LOW (ref 12.0–15.0)
Immature Granulocytes: 12 %
Lymphocytes Relative: 14 %
Lymphs Abs: 1.8 10*3/uL (ref 0.7–4.0)
MCH: 30.8 pg (ref 26.0–34.0)
MCHC: 31.4 g/dL (ref 30.0–36.0)
MCV: 97.9 fL (ref 80.0–100.0)
Monocytes Absolute: 0.9 10*3/uL (ref 0.1–1.0)
Monocytes Relative: 7 %
Neutro Abs: 8.3 10*3/uL — ABNORMAL HIGH (ref 1.7–7.7)
Neutrophils Relative %: 66 %
Platelets: 222 10*3/uL (ref 150–400)
RBC: 2.86 MIL/uL — ABNORMAL LOW (ref 3.87–5.11)
RDW: 13.4 % (ref 11.5–15.5)
WBC: 12.7 10*3/uL — ABNORMAL HIGH (ref 4.0–10.5)
nRBC: 0.2 % (ref 0.0–0.2)

## 2024-02-22 LAB — BASIC METABOLIC PANEL
Anion gap: 10 (ref 5–15)
BUN: 43 mg/dL — ABNORMAL HIGH (ref 8–23)
CO2: 23 mmol/L (ref 22–32)
Calcium: 8.5 mg/dL — ABNORMAL LOW (ref 8.9–10.3)
Chloride: 104 mmol/L (ref 98–111)
Creatinine, Ser: 0.93 mg/dL (ref 0.44–1.00)
GFR, Estimated: 60 mL/min — ABNORMAL LOW (ref >60)
Glucose, Bld: 107 mg/dL — ABNORMAL HIGH (ref 70–99)
Potassium: 4.2 mmol/L (ref 3.5–5.1)
Sodium: 137 mmol/L (ref 135–145)

## 2024-02-22 LAB — GLUCOSE, CAPILLARY
Glucose-Capillary: 112 mg/dL — ABNORMAL HIGH (ref 70–99)
Glucose-Capillary: 114 mg/dL — ABNORMAL HIGH (ref 70–99)
Glucose-Capillary: 162 mg/dL — ABNORMAL HIGH (ref 70–99)

## 2024-02-22 LAB — PHOSPHORUS: Phosphorus: 3.4 mg/dL (ref 2.5–4.6)

## 2024-02-22 LAB — MAGNESIUM: Magnesium: 2.1 mg/dL (ref 1.7–2.4)

## 2024-02-22 LAB — HEPARIN LEVEL (UNFRACTIONATED): Heparin Unfractionated: 0.55 [IU]/mL (ref 0.30–0.70)

## 2024-02-22 MED ORDER — IOHEXOL 300 MG/ML  SOLN
100.0000 mL | Freq: Once | INTRAMUSCULAR | Status: AC | PRN
  Administered 2024-02-22: 100 mL via INTRAVENOUS

## 2024-02-22 MED ORDER — PROCHLORPERAZINE EDISYLATE 10 MG/2ML IJ SOLN: 10.0000 mg | Freq: Four times a day (QID) | INTRAMUSCULAR | Status: DC | PRN

## 2024-02-22 MED ORDER — PROCHLORPERAZINE EDISYLATE 10 MG/2ML IJ SOLN
10.0000 mg | Freq: Four times a day (QID) | INTRAMUSCULAR | Status: DC | PRN
  Administered 2024-02-22 (×3): 10 mg via INTRAVENOUS
  Filled 2024-02-22 (×4): qty 2

## 2024-02-22 MED ORDER — TRAVASOL 10 % IV SOLN
INTRAVENOUS | Status: AC
  Filled 2024-02-22: qty 890.4

## 2024-02-22 MED ORDER — IOHEXOL 300 MG/ML  SOLN: 30.0000 mL | Freq: Once | INTRAMUSCULAR | Status: DC | PRN

## 2024-02-22 NOTE — Progress Notes (Addendum)
 Tracy Preston   DOB:05/28/1935   ZO#:[id number]      ASSESSMENT & PLAN:  1.  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 - Newly diagnosed February 2025 - CT scan 02/04/2024 shows diffuse peritoneal carcinomatosis and mild ascites. - Pelvic ultrasound 02/05/2024 shows 2 cm heterogeneous area at the uterine fundus, endometrial malignancy not excluded. - S/p paracentesis 02/05/2024, 100 mL of yellow fluid removed. 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 -MRI pelvis and CT chest for staging done on 02/11/2024.  Shows abnormal soft tissue throughout the lower uterine and cervix could represent primary uterine/cervical carcinoma or mets. - S/p chemotherapy with carboplatin/Taxol initiated on 02/12/2024.  Tolerated well. - S/p Granix 300 daily x 5 completed 02/17/2024.  High WBC count due to Granix is resolving nicely, 12.7 today.     - GYN Onc following closely.  - Palliative team following - Medical oncology/Dr. Truett Perna following closely.   2.  Small bowel obstruction - Distal SBO with transition point in the RLQ   - Likely due to peritoneal carcinomatosis - NGT discontinued - TPN infusing well - Scheduled for CT abdomen pelvis today, will follow results - Management per surgery   3.  Acute PE DVT, LLE - Per CT of the chest for staging purposes done on 02/11/2024.  This showed incidental acute PE involving the distal right pulmonary artery and lobar and segmental RUL and RML pulmonary artery branches.  Small right pleural effusion. - Remains on IV heparin, continue per protocol - Monitor closely for active bleeding   4.  Anemia, normocytic - HGB stable 8.8 today    - Transfuse PRBC for hemoglobin <7.0. - No transfusional intervention warranted at this time. -Monitor CBC with differential every 2 days    5.  Nausea/vomiting -  Continues on and off - Administer antiemetics Compazine as needed and Zofran ordered   6.  Hypertension - On losartan  - BP stable today - Monitor BP counts - Primary team following      Code Status DNR-limited  Subjective:  Patient seen awake and alert laying in bed.  Reports she just returned from having CT scan done.  States she had to drink contrast and therefore has some nausea.  Reports that her abdomen feels more distended than it did yesterday.  Has been having small bowel movements and flatus.  Objective:  Vitals:   02/21/24 2359 02/22/24 0634  BP: (!) 95/54 (!) 116/58  Pulse: 71 76  Resp: 18 20  Temp: 98.3 F (36.8 C) 98 F (36.7 C)  SpO2: 93% 94%     Intake/Output Summary (Last 24 hours) at 02/22/2024 1026 Last data filed at 02/22/2024 9811 Gross per 24 hour  Intake 1338.49 ml  Output 401 ml  Net 937.49 ml     REVIEW OF SYSTEMS:   Constitutional: Denies fevers, chills or abnormal night sweats Eyes: Denies blurriness of vision, double vision or watery eyes Ears, nose, mouth, throat, and face: Denies mucositis or sore throat Respiratory: Denies cough, dyspnea or wheezes Cardiovascular: Denies palpitation, chest discomfort or lower extremity swelling Gastrointestinal: +Abdominal distention Skin: Denies abnormal skin rashes Lymphatics: Denies new lymphadenopathy or easy bruising Neurological: Denies numbness, tingling or new weaknesses Behavioral/Psych: Mood is stable, no new changes  All other systems were reviewed with the patient and are negative.  PHYSICAL EXAMINATION: ECOG PERFORMANCE STATUS: 2 - Symptomatic, <50% confined to bed  Vitals:  02/21/24 2359 02/22/24 0634  BP: (!) 95/54 (!) 116/58  Pulse: 71 76  Resp: 18 20  Temp: 98.3 F (36.8 C) 98 F (36.7 C)  SpO2: 93% 94%   Filed Weights   02/18/24 0715 02/19/24 0713 02/21/24 0500  Weight: 126 lb 12.2 oz (57.5 kg) 123 lb 10.9 oz (56.1 kg) 126 lb 1.7 oz (57.2 kg)    GENERAL: alert, +mild  distress  SKIN: skin color, texture, turgor are normal, no rashes or significant lesions EYES: normal, conjunctiva are pink and non-injected, sclera clear OROPHARYNX: no exudate, no erythema and lips, buccal mucosa, and tongue normal  NECK: supple, thyroid normal size, non-tender, without nodularity LYMPH: no palpable lymphadenopathy in the cervical, axillary or inguinal LUNGS: clear to auscultation and percussion with normal breathing effort HEART: regular rate & rhythm and no murmurs and no lower extremity edema ABDOMEN: + Abdominal distention MUSCULOSKELETAL: no cyanosis of digits and no clubbing  PSYCH: alert & oriented x 3 with fluent speech NEURO: no focal motor/sensory deficits   All questions were answered. The patient knows to call the clinic with any problems, questions or concerns.   The total time spent in the appointment was 40 minutes encounter with patient including review of chart and various tests results, discussions about plan of care and coordination of care plan  Dawson Bills, NP 02/22/2024 10:26 AM    Labs Reviewed:  Lab Results  Component Value Date   WBC 12.7 (H) 02/22/2024   HGB 8.8 (L) 02/22/2024   HCT 28.0 (L) 02/22/2024   MCV 97.9 02/22/2024   PLT 222 02/22/2024   Recent Labs    02/17/24 0552 02/19/24 0817 02/21/24 0323 02/22/24 0545  NA 137 137 136 137  K 4.2 4.4 4.4 4.2  CL 105 104 103 104  CO2 23 25 25 23   GLUCOSE 119* 103* 109* 107*  BUN 35* 31* 38* 43*  CREATININE 0.71 0.79 0.83 0.93  CALCIUM 8.2* 8.6* 8.5* 8.5*  GFRNONAA >60 >60 >60 60*  PROT 5.3* 6.0* 5.6*  --   ALBUMIN 2.5* 2.6* 2.5*  --   AST 62* 37 30  --   ALT 110* 79* 59*  --   ALKPHOS 184* 246* 231*  --   BILITOT 0.4 0.3 0.4  --     Studies Reviewed:  DG Abd Portable 1V-Small Bowel Obstruction Protocol-initial, 8 hr delay Result Date: 02/18/2024 CLINICAL DATA:  8 hour delay small bowel obstruction EXAM: PORTABLE ABDOMEN - 1 VIEW COMPARISON:  Abdominal x-ray 02/18/2024  FINDINGS: Enteric tube tip is in the fundus of the stomach with sidehole at the level of the gastroesophageal junction. Contrast is seen throughout nondilated colon to the level of the rectum. No dilated bowel loops are seen. IMPRESSION: 1. Enteric tube tip is in the fundus of the stomach with sidehole at the level of the gastroesophageal junction. Recommend advancement 8 cm. 2. Contrast is seen throughout nondilated colon to the level of the rectum. No evidence for bowel obstruction. Electronically Signed   By: Darliss Cheney M.D.   On: 02/18/2024 20:57   DG Abd Portable 1V-Small Bowel Protocol-Position Verification Result Date: 02/18/2024 CLINICAL DATA:  [id number] Encounter for imaging study to confirm nasogastric (NG) tube placement [id number] EXAM: PORTABLE ABDOMEN - 1 VIEW COMPARISON:  02/10/2024 abdominal radiograph FINDINGS: Enteric tube terminates in the proximal stomach. No dilated small bowel loops. No evidence of pneumatosis or pneumoperitoneum. Mild elevation of the left hemidiaphragm with clear lung bases. IMPRESSION: Enteric tube terminates in the  proximal stomach. Electronically Signed   By: Delbert Phenix M.D.   On: 02/18/2024 13:42   ECHOCARDIOGRAM COMPLETE Result Date: 02/13/2024    ECHOCARDIOGRAM REPORT   Patient Name:   TJUANA VICKREY Date of Exam: 02/13/2024 Medical Rec #:  [id number]          Height:       64.0 in Accession #:    [id number]         Weight:       118.9 lb Date of Birth:  02/27/1935          BSA:          1.568 m Patient Age:    88 years           BP:           142/72 mmHg Patient Gender: F                  HR:           71 bpm. Exam Location:  Inpatient Procedure: 2D Echo, Cardiac Doppler and Color Doppler (Both Spectral and Color            Flow Doppler were utilized during procedure). Indications:    Cardiomyopathy- Unspecified I42.9  History:        Patient has no prior history of Echocardiogram examinations.                 Risk Factors:Hypertension.  Sonographer:    Lucendia Herrlich RCS Referring Phys: [id number] Doreen Salvage AMIN IMPRESSIONS  1. Left ventricular ejection fraction, by estimation, is 65 to 70%. The left ventricle has normal function. The left ventricle has no regional wall motion abnormalities. Left ventricular diastolic parameters were normal.  2. Right ventricular systolic function is normal. The right ventricular size is normal. There is normal pulmonary artery systolic pressure. The estimated right ventricular systolic pressure is 23.8 mmHg.  3. The mitral valve is grossly normal. No evidence of mitral valve regurgitation. No evidence of mitral stenosis.  4. The aortic valve is calcified. Aortic valve regurgitation is trivial. Aortic valve sclerosis/calcification is present, without any evidence of aortic stenosis.  5. The inferior vena cava is normal in size with greater than 50% respiratory variability, suggesting right atrial pressure of 3 mmHg. FINDINGS  Left Ventricle: Left ventricular ejection fraction, by estimation, is 65 to 70%. The left ventricle has normal function. The left ventricle has no regional wall motion abnormalities. Strain imaging was not performed. The left ventricular internal cavity  size was normal in size. There is no left ventricular hypertrophy. Left ventricular diastolic parameters were normal. Right Ventricle: The right ventricular size is normal. No increase in right ventricular wall thickness. Right ventricular systolic function is normal. There is normal pulmonary artery systolic pressure. The tricuspid regurgitant velocity is 2.28 m/s, and  with an assumed right atrial pressure of 3 mmHg, the estimated right ventricular systolic pressure is 23.8 mmHg. Left Atrium: Left atrial size was normal in size. Right Atrium: Right atrial size was normal in size. Pericardium: There is no evidence of pericardial effusion. Mitral Valve: The mitral valve is grossly normal. No evidence of mitral valve regurgitation. No evidence of mitral valve stenosis.  Tricuspid Valve: The tricuspid valve is grossly normal. Tricuspid valve regurgitation is trivial. No evidence of tricuspid stenosis. Aortic Valve: The aortic valve is calcified. Aortic valve regurgitation is trivial. Aortic regurgitation PHT measures 299 msec. Aortic valve sclerosis/calcification is present, without any evidence of aortic  stenosis. Aortic valve mean gradient measures 9.0 mmHg. Aortic valve peak gradient measures 11.1 mmHg. Aortic valve area, by VTI measures 1.23 cm. Pulmonic Valve: The pulmonic valve was grossly normal. Pulmonic valve regurgitation is trivial. No evidence of pulmonic stenosis. Aorta: The aortic root and ascending aorta are structurally normal, with no evidence of dilitation. Venous: The inferior vena cava is normal in size with greater than 50% respiratory variability, suggesting right atrial pressure of 3 mmHg. IAS/Shunts: The atrial septum is grossly normal. Additional Comments: 3D imaging was not performed. Mild ascites is present.  LEFT VENTRICLE PLAX 2D LVIDd:         4.00 cm   Diastology LVIDs:         2.60 cm   LV e' medial:    9.25 cm/s LV PW:         0.90 cm   LV E/e' medial:  6.4 LV IVS:        1.00 cm   LV e' lateral:   12.20 cm/s LVOT diam:     1.80 cm   LV E/e' lateral: 4.9 LV SV:         48 LV SV Index:   31 LVOT Area:     2.54 cm  RIGHT VENTRICLE             IVC RV S prime:     11.00 cm/s  IVC diam: 1.20 cm TAPSE (M-mode): 1.8 cm LEFT ATRIUM             Index        RIGHT ATRIUM           Index LA diam:        3.70 cm 2.36 cm/m   RA Area:     12.50 cm LA Vol (A2C):   29.1 ml 18.55 ml/m  RA Volume:   29.10 ml  18.55 ml/m LA Vol (A4C):   40.0 ml 25.50 ml/m LA Biplane Vol: 34.3 ml 21.87 ml/m  AORTIC VALVE                     PULMONIC VALVE AV Area (Vmax):    1.61 cm      PR End Diast Vel: 2.12 msec AV Area (Vmean):   1.25 cm AV Area (VTI):     1.23 cm AV Vmax:           166.67 cm/s AV Vmean:          138.000 cm/s AV VTI:            0.392 m AV Peak Grad:       11.1 mmHg AV Mean Grad:      9.0 mmHg LVOT Vmax:         105.50 cm/s LVOT Vmean:        67.950 cm/s LVOT VTI:          0.190 m LVOT/AV VTI ratio: 0.48 AI PHT:            299 msec  AORTA Ao Root diam: 3.10 cm Ao Asc diam:  2.80 cm MITRAL VALVE               TRICUSPID VALVE MV Area (PHT): 3.27 cm    TR Peak grad:   20.8 mmHg MV Decel Time: 232 msec    TR Vmax:        228.00 cm/s MV E velocity: 59.60 cm/s MV A velocity: 76.60 cm/s  SHUNTS MV E/A ratio:  0.78        Systemic VTI:  0.19 m                            Systemic Diam: 1.80 cm Lennie Odor MD Electronically signed by Lennie Odor MD Signature Date/Time: 02/13/2024/8:53:15 PM    Final    VAS [place] LOWER EXTREMITY VENOUS (DVT) Result Date: 02/12/2024  Lower Venous DVT Study Patient Name:  LUCIANA CAMMARATA  Date of Exam:   02/12/2024 Medical Rec #: [id number]           Accession #:    [id number] Date of Birth: 1935-11-17           Patient Gender: F Patient Age:   15 years Exam Location:  [facility] Procedure:      VAS [place] LOWER EXTREMITY VENOUS (DVT) Referring Phys: Stephania Fragmin --------------------------------------------------------------------------------  Indications: Pulmonary embolism.  Risk Factors: Newly diagnosed peritoneal carcinomatosis. Comparison Study: No previous exams Performing Technologist: Jody Hill RVT, RDMS  Examination Guidelines: A complete evaluation includes B-mode imaging, spectral Doppler, color Doppler, and power Doppler as needed of all accessible portions of each vessel. Bilateral testing is considered an integral part of a complete examination. Limited examinations for reoccurring indications may be performed as noted. The reflux portion of the exam is performed with the patient in reverse Trendelenburg.  +---------+---------------+---------+-----------+----------+--------------+ RIGHT    CompressibilityPhasicitySpontaneityPropertiesThrombus Aging  +---------+---------------+---------+-----------+----------+--------------+ CFV      Full           Yes      No                                  +---------+---------------+---------+-----------+----------+--------------+ SFJ      Full                                                        +---------+---------------+---------+-----------+----------+--------------+ FV Prox  Full           Yes      Yes                                 +---------+---------------+---------+-----------+----------+--------------+ FV Mid   Full           Yes      Yes                                 +---------+---------------+---------+-----------+----------+--------------+ FV DistalFull           Yes      Yes                                 +---------+---------------+---------+-----------+----------+--------------+ PFV      Full                                                        +---------+---------------+---------+-----------+----------+--------------+ POP  Full           Yes      Yes                                 +---------+---------------+---------+-----------+----------+--------------+ PTV      Full                                                        +---------+---------------+---------+-----------+----------+--------------+ PERO     Full                                                        +---------+---------------+---------+-----------+----------+--------------+   +---------+---------------+---------+-----------+----------+--------------+ LEFT     CompressibilityPhasicitySpontaneityPropertiesThrombus Aging +---------+---------------+---------+-----------+----------+--------------+ CFV      Full           Yes      Yes                                 +---------+---------------+---------+-----------+----------+--------------+ SFJ      Full                                                         +---------+---------------+---------+-----------+----------+--------------+ FV Prox  Full           Yes      Yes                                 +---------+---------------+---------+-----------+----------+--------------+ FV Mid   Full           Yes      Yes                                 +---------+---------------+---------+-----------+----------+--------------+ FV DistalFull           Yes      Yes                                 +---------+---------------+---------+-----------+----------+--------------+ PFV      Full                                                        +---------+---------------+---------+-----------+----------+--------------+ POP      Full           Yes      Yes                                 +---------+---------------+---------+-----------+----------+--------------+ PTV      None  No       No                   Acute          +---------+---------------+---------+-----------+----------+--------------+ PERO     None           No       No                   Acute          +---------+---------------+---------+-----------+----------+--------------+     Summary: BILATERAL: -No evidence of popliteal cyst, bilaterally. RIGHT: - There is no evidence of deep vein thrombosis in the lower extremity.  LEFT: - Findings consistent with acute deep vein thrombosis involving the left peroneal veins, and left posterior tibial veins.   *See table(s) above for measurements and observations. Electronically signed by Carolynn Sayers on 02/12/2024 at 8:18:12 PM.    Final    CT CHEST W CONTRAST Addendum Date: 02/11/2024 ADDENDUM REPORT: 02/11/2024 15:54 ADDENDUM: Critical Value/emergent results were called by telephone at the time of interpretation on 02/11/2024 at 3:54 pm to provider Eugene Garnet, MD, who verbally acknowledged these results. Electronically Signed   By: Delbert Phenix M.D.   On: 02/11/2024 15:54   Result Date: 02/11/2024 CLINICAL DATA:   Peritoneal carcinomatosis. Chest staging. * Tracking Code: BO * EXAM: CT CHEST WITH CONTRAST TECHNIQUE: Multidetector CT imaging of the chest was performed during intravenous contrast administration. RADIATION DOSE REDUCTION: This exam was performed according to the departmental dose-optimization program which includes automated exposure control, adjustment of the mA and/or kV according to patient size and/or use of iterative reconstruction technique. CONTRAST:  75mL OMNIPAQUE IOHEXOL 300 MG/ML  SOLN COMPARISON:  02/04/2024 CT abdomen/pelvis. 02/08/2024 chest radiograph. FINDINGS: Cardiovascular: Normal heart size. No significant pericardial effusion/thickening. Left anterior descending coronary atherosclerosis. Right PICC terminates in the middle third of the SVC. Atherosclerotic nonaneurysmal thoracic aorta. Incidental acute pulmonary embolism involving the distal right pulmonary artery and lobar and segmental right upper lobe and right middle lobe pulmonary artery branches. No saddle embolus. Normal caliber main pulmonary artery. RV/LV ratio 1.0. Mediastinum/Nodes: No significant thyroid nodules. Fluid level in the mid to lower thoracic esophagus with enteric tube terminating in the proximal stomach. No right axillary adenopathy. Mild left axillary adenopathy up to 1.1 cm (series 2/image 61). No pathologically enlarged mediastinal or hilar nodes. Lungs/Pleura: No pneumothorax. Small layering right pleural effusion. No left pleural effusion. Calcified 1 cm posterior right lower lobe granuloma. Mild dependent bilateral lower lobe atelectasis. No acute consolidative airspace disease, lung masses or significant pulmonary nodules. Upper abdomen: Moderate upper abdominal ascites with nodular soft tissue caking in the visualized anterior left upper peritoneum, as seen on recent CT abdomen study. Several hypodense liver lesions scattered throughout the visualized liver, unchanged, largest 1.6 cm in the inferior liver on  series 2/image 155. Musculoskeletal: No aggressive appearing focal osseous lesions. Mild thoracic spondylosis. IMPRESSION: 1. Incidental acute pulmonary embolism involving the distal right pulmonary artery and lobar and segmental right upper lobe and right middle lobe pulmonary artery branches. No saddle embolus. Normal caliber main pulmonary artery. Borderline elevated RV/LV Ratio = 1.0. 2. Small layering right pleural effusion. Mild dependent bilateral lower lobe atelectasis. 3. Mild left axillary adenopathy, cannot exclude metastatic disease. No other evidence of metastatic disease in the chest. 4. One vessel coronary atherosclerosis. 5. Moderate upper abdominal ascites with nodular soft tissue caking in the visualized anterior left upper peritoneum, as seen on recent CT  abdomen study, compatible with peritoneal carcinomatosis. Several hypodense liver lesions scattered throughout the visualized liver, unchanged, suspicious for liver metastases. Electronically Signed: By: Delbert Phenix M.D. On: 02/11/2024 15:28   MR PELVIS W WO CONTRAST Result Date: 02/11/2024 CLINICAL DATA:  "Occult malignancy". Peritoneal carcinomatosis on abdominopelvic CT with bowel obstruction. EXAM: MRI PELVIS WITHOUT AND WITH CONTRAST TECHNIQUE: Multiplanar multisequence MR imaging of the pelvis was performed both before and after administration of intravenous contrast. CONTRAST:  5mL GADAVIST GADOBUTROL 1 MMOL/ML IV SOLN COMPARISON:  CT of 02/04/2024 FINDINGS: Urinary Tract: Primarily decompressed urinary bladder. Left renal sinus cysts without hydronephrosis. Bowel: Fluid-filled small bowel loops within the upper and mid pelvis measure up to 2.0 cm on 19/17. Improved from 3.4 cm on the prior exam. Again identified is an intussusception of the sigmoid into the rectum. There is mild hyperenhancement within the lead point including on 41/17, without restricted diffusion in this area. Vascular/Lymphatic: Ileocolic mesenteric adenopathy  including at 2.2 x 1.8 cm on 08/17. No pelvic sidewall adenopathy. Reproductive: The endometrium is thickened for age including at 1.0 cm on 18/4. This is followed to the level of the cervix and lower uterine segment, where soft tissue fullness is again identified including on 18/4 and 21/3. Correlate restricted diffusion on 22/11. Example 3.7 x 3.7 cm on 31/17. There is amorphous soft tissue fullness within both adnexa, greater right than left. Example restricted diffusion on 17/11. The right adnexal soft tissue fullness measures 3.3 x 3.3 cm on 18/17. No separate ovarian tissue identified. Other: Peritoneal carcinomatosis, as evidenced by diffuse peritoneal enhancement and restricted diffusion (example 22/11). Small volume pelvic fluid. Musculoskeletal: No acute osseous abnormality. IMPRESSION: 1. Abnormal amorphous soft tissue throughout lower uterine segment and cervix could represent primary uterine/cervical carcinoma or metastatic disease. 2. Diffuse peritoneal carcinomatosis as better imaged on dedicated CT. Ileocolic mesenteric nodal metastasis. 3. Right greater than left adnexal soft tissue fullness likely represents ovarian metastasis. 4. Chronic or recurrent sigmoid/rectal intussusception since 02/04/2024. Vague hyperenhancement in the region of the lead point, without dominant mass. Recommend physical exam correlation to exclude either primary rectosigmoid carcinoma or serosal sigmoid implant lead point. 5. Small volume pelvic fluid. 6. Improved small bowel dilatation since prior CT. Electronically Signed   By: Jeronimo Greaves M.D.   On: 02/11/2024 15:42   DG Abd 1 View Result Date: 02/10/2024 CLINICAL DATA:  [id number] Abdominal distention [id number] SBO (small bowel obstruction) (HCC) [id number] EXAM: ABDOMEN - 1 VIEW COMPARISON:  02/08/2024 FINDINGS: NG tube terminates within the proximal stomach. Worsening small bowel dilation in the mid abdomen measuring up to 5.3 cm (previously 3.3 cm. Paucity of bowel  gas within the colon. No gross free intraperitoneal air. IMPRESSION: 1. Worsening small bowel obstruction. 2. NG tube terminates within the proximal stomach. Recommend advancement. Electronically Signed   By: Duanne Guess D.O.   On: 02/10/2024 09:35   DG Chest 1 View Result Date: 02/08/2024 CLINICAL DATA:  PICC line placement EXAM: CHEST  1 VIEW COMPARISON:  02/05/2024 FINDINGS: Right PICC line in place with the tip in the SVC. NG tube is in the stomach. Heart and mediastinal contours within normal limits. No confluent opacities or effusions. No acute bony abnormality. IMPRESSION: Right PICC line tip in the SVC. No active cardiopulmonary disease. Electronically Signed   By: Charlett Nose M.D.   On: 02/08/2024 17:51   [place] EKG SITE RITE Result Date: 02/08/2024 If Site Rite image not attached, placement could not be confirmed due to current cardiac  rhythm.  DG Abd Portable 1V Result Date: 02/08/2024 CLINICAL DATA:  Check gastric catheter placement EXAM: PORTABLE ABDOMEN - 1 VIEW COMPARISON:  02/04/2024 FINDINGS: Scattered large and small bowel gas is noted. Mild small bowel dilatation is noted significantly improved when compared with the prior exam. Gastric catheter is noted with the tip in the stomach. No free air is noted. IMPRESSION: Persistent but significantly improved small bowel dilatation when compared with the prior exam. Electronically Signed   By: Alcide Clever M.D.   On: 02/08/2024 09:34   US Paracentesis Result Date: 02/05/2024 INDICATION: 89 year old with new abdominal distention, ascites. Request made for diagnostic and therapeutic paracentesis. EXAM: ULTRASOUND GUIDED DIAGNOSTIC  PARACENTESIS MEDICATIONS: 10 mL 1% lidocaine. COMPLICATIONS: None immediate. PROCEDURE: Informed written consent was obtained from the patient after a discussion of the risks, benefits and alternatives to treatment. A timeout was performed prior to the initiation of the procedure. Initial ultrasound scanning  demonstrates a small amount of ascites within the left lateral abdomen interlooped with bowel. The left lateral abdomen was prepped and draped in the usual sterile fashion. 1% lidocaine was used for local anesthesia. Following this, a 19 gauge, 7-cm, Yueh catheter was introduced. An ultrasound image was saved for documentation purposes. The paracentesis was performed. The catheter was removed and a dressing was applied. The patient tolerated the procedure well without immediate post procedural complication. FINDINGS: A total of approximately 100 mL of yellow fluid was removed. Samples were sent to the laboratory as requested by the clinical team. IMPRESSION: Successful ultrasound-guided paracentesis yielding 100 mL of peritoneal fluid. Performed by: Loyce Dys PA-C Electronically Signed   By: Malachy Moan M.D.   On: 02/05/2024 14:31   US PELVIC COMPLETE WITH TRANSVAGINAL Result Date: 02/05/2024 CLINICAL DATA:  89 year old female with ascites, peritoneal carcinomatosis on CT Abdomen and Pelvis yesterday. EXAM: TRANSABDOMINAL AND TRANSVAGINAL ULTRASOUND OF PELVIS TECHNIQUE: Both transabdominal and transvaginal ultrasound examinations of the pelvis were performed. Transabdominal technique was performed for global imaging of the pelvis including uterus, ovaries, adnexal regions, and pelvic cul-de-sac. It was necessary to proceed with endovaginal exam following the transabdominal exam to visualize the ovaries. COMPARISON:  CT Abdomen and Pelvis 02/04/2024. FINDINGS: Uterus Measurements: 6.9 x 3.9 x 3.9 cm = volume: 41 mL. No obvious myometrial mass. Endometrium Heterogeneous hypoechoic area at the fundus endometrium (image 49 of series 1). This area encompasses about 2 cm (image 55). See also cine series 3 images. Otherwise the echogenic endometrial stripe is up to 5 mm. Right ovary Measurements: 3.5 x 2.1 x 2.8 cm = volume: 11 mL. No ovarian mass identified. Left ovary Measurements: 2.3 x 1.5 x 1.7 cm =  volume: 3 mL. No ovarian mass identified. Other findings Small volume ascites again visible in the pelvis. IMPRESSION: 1. No ovarian mass identified by ultrasound. 2. There is a 2 cm heterogeneous area at the uterine fundus which is favored to be endometrial related. Elsewhere the endometrial stripe is about 5 mm. An Endometrial Malignancy is not excluded. 3. Ascites, in conjunction with the extensive additional abdominal and pelvic abnormalities demonstrated by CT yesterday. Electronically Signed   By: Odessa Fleming M.D.   On: 02/05/2024 12:17   DG Chest Portable 1 View Result Date: 02/05/2024 CLINICAL DATA:  NG tube replacement confirmation EXAM: PORTABLE CHEST - 1 VIEW COMPARISON:  02/04/2024 FINDINGS: Cardiomediastinal silhouette and pulmonary vasculature are within normal limits. Lungs are clear. Nasogastric tube extends to the left upper quadrant the side hole located at the  level of the gastroesophageal junction. It is slightly retracted compared to prior exam. IMPRESSION: Nasogastric tube terminates in the left upper quadrant in the expected site of the stomach. The side hole of the NG tube is located at the level of the gastroesophageal junction. Electronically Signed   By: Acquanetta Belling M.D.   On: 02/05/2024 07:38   DG Chest Portable 1 View Result Date: 02/04/2024 CLINICAL DATA:  NG tube placement. Abdominal discomfort, heartburn, and nausea. EXAM: PORTABLE CHEST 1 VIEW COMPARISON:  None Available. FINDINGS: The heart size and mediastinal contours are within normal limits. There is atherosclerotic calcification of the aorta. No consolidation, effusion, or pneumothorax. An enteric tube terminates in the stomach. No acute osseous abnormality. IMPRESSION: 1. No active disease. 2. Enteric tube terminates in the stomach. Electronically Signed   By: Thornell Sartorius M.D.   On: 02/04/2024 21:20   CT ABDOMEN PELVIS W CONTRAST Result Date: 02/04/2024 CLINICAL DATA:  Abdominal pain, bloating, nausea and vomiting for 2  weeks. * Tracking Code: BO * EXAM: CT ABDOMEN AND PELVIS WITH CONTRAST TECHNIQUE: Multidetector CT imaging of the abdomen and pelvis was performed using the standard protocol following bolus administration of intravenous contrast. RADIATION DOSE REDUCTION: This exam was performed according to the departmental dose-optimization program which includes automated exposure control, adjustment of the mA and/or kV according to patient size and/or use of iterative reconstruction technique. CONTRAST:  OMNIPAQUE IOHEXOL 300 MG/ML  SOLN COMPARISON:  None Available. FINDINGS: Lower Chest: No acute findings. Hepatobiliary: A few tiny hepatic cysts are seen, however, there are multiple hypovascular masses involving the right and left hepatic lobes, largest in the central right hepatic lobe measuring 1.9 x 1.5 cm on image 26/2. These are consistent with liver metastases. Gallbladder is unremarkable. No evidence of biliary ductal dilatation. Pancreas:  No mass or inflammatory changes. Spleen: Within normal limits in size and appearance. Adrenals/Urinary Tract: No suspicious masses identified. No evidence of ureteral calculi or hydronephrosis. Stomach/Bowel: Distal small bowel obstruction, with transition point in the right lower quadrant, likely due to peritoneal carcinoma. Rectal intussusception incidentally noted. Vascular/Lymphatic: Mild retroperitoneal lymphadenopathy in the left para-aortic and aortocaval spaces. No acute vascular findings. Reproductive: A 1.6 cm peripherally calcified fibroid is seen in the uterine fundus. Poorly defined masslike soft tissue prominence is seen in the region of the cervix and lower uterine segment measuring 3.6 x 3.3 cm on image 63/2, possibly representing a primary cervical or endometrial carcinoma. Poorly defined soft tissue density is seen in both adnexal regions and along the uterine fundus, consistent with peritoneal carcinomatosis. Other: Mild ascites is seen. Peritoneal and  omental soft tissue nodules and masses are seen throughout the abdomen and pelvis, consistent with peritoneal carcinomatosis. Largest conglomerate mass is seen in the right abdomen measuring approximately 6.7 x 3.4 cm on image 37/2. Musculoskeletal:  No suspicious bone lesions identified. IMPRESSION: Diffuse peritoneal carcinomatosis and mild ascites. Distal small bowel obstruction, with transition point in the right lower quadrant, likely due to peritoneal carcinomatosis. Masslike soft tissue prominence in the region of the lower uterine segment and cervix, possibly representing primary cervical or endometrial carcinoma. Liver metastases. Mild retroperitoneal lymphadenopathy, consistent with metastatic disease. Rectal intussusception incidentally noted. No definite lead mass identified. Electronically Signed   By: Danae Orleans M.D.   On: 02/04/2024 11:34    Ms. Fessel was interviewed and examined.  She is now at day 11 following cycle 1 Taxol/carboplatin.  She has persistent obstructive symptoms.  She has nausea and the  abdomen is more distended.  She will undergo a restaging CT abdomen/pelvis today.  We will check a CA125.  Ms. Dolle will consider placement of a venting gastrostomy if the CT reveals persistent bowel obstruction.  I discussed the case with Dr. Venetia Maxon this morning.  Recommendations: 1.  CT abdomen/pelvis today 2.  CA125 3.  Change anticoagulation to apixaban if she can tolerate oral medications, Lovenox if she cannot tolerate pills 4.  Consider palliative venting gastrostomy if the restaging CT confirms an obstruction

## 2024-02-22 NOTE — Progress Notes (Signed)
 PHARMACY - ANTICOAGULATION CONSULT NOTE  Pharmacy Consult for heparin  Indication: acute pulmonary embolus and DVT  Allergies  Allergen Reactions   Calcium-Containing Compounds Other (See Comments)    Upsets the stomach, so it is not taken very often    Patient Measurements: Height: 5\' 4"  (162.6 cm) Weight: 57.2 kg (126 lb 1.7 oz) IBW/kg (Calculated) : 54.7 Heparin Dosing Weight: use total body weight   Vital Signs: Temp: 98 F (36.7 C) (02/25 0634) Temp Source: Oral (02/24 2359) BP: 116/58 (02/25 0634) Pulse Rate: 76 (02/25 0634)  Labs: Recent Labs    02/19/24 0817 02/19/24 1819 02/20/24 0651 02/20/24 1653 02/21/24 0323 02/22/24 0545  HGB 9.9*  --  9.7*  --  9.5* 8.8*  HCT 32.1*  --  31.2*  --  29.6* 28.0*  PLT 271  --  267  --  247 222  HEPARINUNFRC 0.87*   < > 0.77* 0.68 0.64 0.55  CREATININE 0.79  --   --   --  0.83 0.93   < > = values in this interval not displayed.    Estimated Creatinine Clearance: 37.5 mL/min (by C-G formula based on SCr of 0.93 mg/dL).  Assessment: 89 y/o F with metastatic ovarian cancer admitted with SBO secondary to peritoneal carcinomatosis. Chest CT on 2/14 showed incidental acute PE in the "the distal right pulmonary artery and lobar and segmental right upper lobe and right middle lobe pulmonary artery branches" with borderline elevated RV/LV ratio.  LE doppler on 02/12/24 positive for acute DVT in the left peroneal veins and left posterior tibial veins. Pharmacy consulted for IV heparin dosing for VTE treatment.  Significant events:  - 2/15: received carboplatin/paclitaxel   Today, 02/22/2024: Heparin level remains therapeutic at 0.55 with heparin infusing at 900 units/hr UFH infusing via PICC, labs being obtained via peripheral stick CBC: Hgb low but stable, Plt WNL Note chemo received 2/15 No bleeding or complications of therapy per RN  Goal of Therapy:  Heparin level 0.3-0.7 units/ml Monitor platelets by anticoagulation  protocol: Yes   Plan:  Continue heparin infusion at 900 units/hr Monitor daily heparin level, CBC, signs/symptoms of bleeding F/U transition to direct oral anticoagulant (DOAC) when taking orals    Thank you for allowing pharmacy to be a part of this patient's care.  Selinda Eon, PharmD, BCPS Clinical Pharmacist [facility] 02/22/2024 7:31 AM

## 2024-02-22 NOTE — Plan of Care (Signed)
  Problem: Education: Goal: Knowledge of General Education information will improve Description: Including pain rating scale, medication(s)/side effects and non-pharmacologic comfort measures Outcome: Progressing   Problem: Health Behavior/Discharge Planning: Goal: Ability to manage health-related needs will improve Outcome: Progressing   Problem: Clinical Measurements: Goal: Ability to maintain clinical measurements within normal limits will improve Outcome: Progressing Goal: Will remain free from infection Outcome: Progressing Goal: Diagnostic test results will improve Outcome: Progressing   Problem: Activity: Goal: Risk for activity intolerance will decrease Outcome: Progressing   Problem: Nutrition: Goal: Adequate nutrition will be maintained Outcome: Progressing   Problem: Pain Managment: Goal: General experience of comfort will improve and/or be controlled Outcome: Progressing   Problem: Safety: Goal: Ability to remain free from injury will improve Outcome: Progressing   Problem: Skin Integrity: Goal: Risk for impaired skin integrity will decrease Outcome: Progressing

## 2024-02-22 NOTE — Progress Notes (Signed)
 PROGRESS NOTE  Tracy Preston  QMV:[id number] DOB: 11/12/1935 DOA: 02/04/2024 PCP: Marden Noble, MD (Inactive)   Brief Narrative: Patient is a 89 year old female with history of hypertension, breast cancer who presented with abdominal distention, vomiting for 2 weeks.  CT abdomen was concerning for new metastatic disease from possible GYN origin, SBO.  Further workup showed peritoneal carcinomatosis with likely GYN malignancy.  GYN oncology, general surgery consulted pelvic MRI showed advanced GYN malignancy.  CT chest with contrast showed distal pulmonary present with cor pulmonale, started on heparin drip.  Received first cycle of chemo on 2/15.  Hospital course remarkable for persistent bowel obstruction, started on TPN, Decadron.  Palliative care following.  Now having bowel movements.  Small bowel SBO protocol shows nondilated colon with contrast on entire colon. NG tube removed on 2/22, started on clears.  After that she developed intermittent nausea, andominal distention.  Plan for follow-up CT imaging today  Assessment & Plan:  Principal Problem:   Carcinomatosis peritonei causing obstruction Active Problems:   SBO (small bowel obstruction) (HCC)   GERD (gastroesophageal reflux disease)   Essential hypertension   Peritoneal carcinomatosis (HCC)   Anemia   Malnutrition of moderate degree   Palliative care encounter   Adenocarcinoma [facility])   Malignant neoplasm metastatic to liver (HCC)   DNR (do not resuscitate)   Goals of care, counseling/discussion   Need for emotional support   Counseling and coordination of care  Malignant small bowel obstruction secondary to peritoneal carcinomatosis/ovarian adenocarcinoma with metastasis: Presented with abdominal distention, vomiting.GYN oncology, general surgery consulted.The  pelvic MRI showed advanced GYN malignancy.  CT chest with contrast showed distal PE, started on heparin drip.  Received first cycle of chemo on 2/15.  Hospital course  remarkable for persistent bowel obstruction, started on TPN, Decadron. 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  Small bowel SBO protocol on 2/22 showed nondilated colon with contrast on entire colon. NG tube removed.  She is having bowel movements and passing gas but has intermittent nausea,  abdominal distention.  Plan for follow-up CT imaging today If she continues not to tolerate the diet then she may need a venting PEG tube.  General surgery also following.  Right-sided distal PE: Started on heparin drip.  Lower EXTR Doppler positive for DVT.  Eventual plan is to transition to Eliquis.  Echo showed preserved EF  Leukocytosis: She received Granix by oncology. Improving.   No fever.  Poor nutritional status/severe protein calorie malnutrition: On TPN  Hypertension: Blood pressure soft so losartan will be discontinued for now  GERD: Continue PPI  Normocytic anemia: Currently hemoglobin stable  Goals of care:Palliative care was following for goals of care .CODE STATUS DNR   Nutrition Problem: Moderate Malnutrition Etiology: acute illness    DVT prophylaxis:iv heparin     Code Status: Limited: Do not attempt resuscitation (DNR) -DNR-LIMITED -Do Not Intubate/DNI   Family Communication:  discussed with son-in-law Dr. Venetia Maxon on at bedside on 2/25   Patient status:Inpatient  Patient is from :Home  Anticipated discharge MW:UXLK  Estimated DC date:After oncology clearance, tolerance of diet   Consultants: General Surgery, palliative care, oncology  Procedures: None yet  Antimicrobials:  Anti-infectives (From admission, onward)    None       Subjective: Patient seen and examined at bedside today.  Hemodynamically stable.  Lying in bed.  She still has nausea.  Had bowel movement this morning.  Passing gas.  But abdomen is distended.  She was drinking contrast  Objective: Vitals:   02/21/24 0539 02/21/24  1413 02/21/24 2359 02/22/24 0634  BP: (!) 106/56 (!) 98/55 (!) 95/54 (!) 116/58  Pulse: 69 78 71 76  Resp: 16 20 18 20   Temp: 97.7 F (36.5 C) 97.8 F (36.6 C) 98.3 F (36.8 C) 98 F (36.7 C)  TempSrc: Oral Oral Oral   SpO2: 93% 94% 93% 94%  Weight:      Height:        Intake/Output Summary (Last 24 hours) at 02/22/2024 1130 Last data filed at 02/22/2024 1610 Gross per 24 hour  Intake 1138.49 ml  Output 301 ml  Net 837.49 ml   Filed Weights   02/18/24 0715 02/19/24 0713 02/21/24 0500  Weight: 57.5 kg 56.1 kg 57.2 kg    Examination:   General exam: Overall comfortable, not in distress, pleasant elderly female HEENT: PERRL Respiratory system:  no wheezes or crackles  Cardiovascular system: S1 & S2 heard, RRR.  Gastrointestinal system: Abdomen is distended, nontender.  Bowel sounds sluggish today. Central nervous system: Alert and oriented Extremities: No edema, no clubbing ,no cyanosis,picc line Skin: No rashes, no ulcers,no icterus     Data Reviewed: I have personally reviewed following labs and imaging studies  CBC: Recent Labs  Lab 02/18/24 0554 02/19/24 0817 02/20/24 0651 02/21/24 0323 02/22/24 0545  WBC 23.5* 18.3* 13.0* 12.6* 12.7*  NEUTROABS 19.5* 13.0* 10.7* 8.3* 8.3*  HGB 9.3* 9.9* 9.7* 9.5* 8.8*  HCT 30.5* 32.1* 31.2* 29.6* 28.0*  MCV 101.0* 98.5 98.4 96.1 97.9  PLT 254 271 267 247 222   Basic Metabolic Panel: Recent Labs  Lab 02/17/24 0552 02/19/24 0817 02/21/24 0323 02/22/24 0545  NA 137 137 136 137  K 4.2 4.4 4.4 4.2  CL 105 104 103 104  CO2 23 25 25 23   GLUCOSE 119* 103* 109* 107*  BUN 35* 31* 38* 43*  CREATININE 0.71 0.79 0.83 0.93  CALCIUM 8.2* 8.6* 8.5* 8.5*  MG 2.1  --  2.0 2.1  PHOS 3.2  --  4.0 3.4     No results found for this or any previous visit (from the past 240 hours).   Radiology Studies: No results found.   Scheduled Meds:  Chlorhexidine Gluconate Cloth  6 each Topical Daily   dexamethasone (DECADRON)  injection  8 mg Intravenous Daily   insulin aspart  0-9 Units Subcutaneous Q8H   pantoprazole (PROTONIX) IV  40 mg Intravenous Q12H   sodium chloride flush  10-40 mL Intracatheter Q12H   Continuous Infusions:  heparin 900 Units/hr (02/22/24 0346)   TPN ADULT (ION) 70 mL/hr at 02/22/24 0346   TPN ADULT (ION)       LOS: 18 days   Burnadette Pop, MD [facility] P2/25/2025, 11:30 AM

## 2024-02-22 NOTE — Progress Notes (Signed)
 Progress Note     Subjective: Pt reports she had several bowel movements in last 24 hrs and is passing some flatus. She became nauseated with small amount of just jello yesterday however. She is drinking oral contrast for CT this AM and has gotten down ~30 oz but feeling very distended. No significant abdominal pain.   Objective: Vital signs in last 24 hours: Temp:  [97.8 F (36.6 C)-98.3 F (36.8 C)] 98 F (36.7 C) (02/25 0634) Pulse Rate:  [71-78] 76 (02/25 0634) Resp:  [18-20] 20 (02/25 0634) BP: (95-116)/(54-58) 116/58 (02/25 0634) SpO2:  [93 %-94 %] 94 % (02/25 0634) Last BM Date : 02/21/24  Intake/Output from previous day: 02/24 0701 - 02/25 0700 In: 1348.5 [P.O.:440; I.V.:908.5] Out: 401 [Urine:400; Stool:1] Intake/Output this shift: No intake/output data recorded.  PE: General: pleasant, WD, WN female who is laying in bed in NAD Lungs: Respiratory effort nonlabored Abd: soft, NT, distended Psych: A&Ox3 with an appropriate affect.    Lab Results:  Recent Labs    02/21/24 0323 02/22/24 0545  WBC 12.6* 12.7*  HGB 9.5* 8.8*  HCT 29.6* 28.0*  PLT 247 222   BMET Recent Labs    02/21/24 0323 02/22/24 0545  NA 136 137  K 4.4 4.2  CL 103 104  CO2 25 23  GLUCOSE 109* 107*  BUN 38* 43*  CREATININE 0.83 0.93  CALCIUM 8.5* 8.5*   PT/INR No results for input(s): "LABPROT", "INR" in the last 72 hours. CMP     Component Value Date/Time   NA 137 02/22/2024 0545   K 4.2 02/22/2024 0545   CL 104 02/22/2024 0545   CO2 23 02/22/2024 0545   GLUCOSE 107 (H) 02/22/2024 0545   BUN 43 (H) 02/22/2024 0545   CREATININE 0.93 02/22/2024 0545   CALCIUM 8.5 (L) 02/22/2024 0545   PROT 5.6 (L) 02/21/2024 0323   ALBUMIN 2.5 (L) 02/21/2024 0323   AST 30 02/21/2024 0323   ALT 59 (H) 02/21/2024 0323   ALKPHOS 231 (H) 02/21/2024 0323   BILITOT 0.4 02/21/2024 0323   GFRNONAA 60 (L) 02/22/2024 0545   Lipase     Component Value Date/Time   LIPASE 29 02/04/2024 1619        Studies/Results: No results found.  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 Truett Perna with ONC following - Chemotherapy with carboplatin and Taxol initiated on 02/12/2024. Tolerated well. - On Granix 300 mcg daily x 5, completed therapy 02/17/2024.  WBC remains somewhat elevated, likely due to Granix.   - GYN-ONC and palliative following as well - agree with recommendation for repeat CT to get an updated picture  - if venting PEG tube indicated, pt would prefer to have this done in least invasive method possible - no emergent/urgent indications for surgical intervention, will follow up CT results and plans from there  FEN: CLD as tolerated, TPN VTE: heparin gtt ID: no current abx  - per TRH -  HTN Acute PE Normocytic anemia    LOS: 18 days   I reviewed Consultant medical oncology, GYN oncology and palliative notes, hospitalist notes, last 24 h vitals and pain scores, last 48 h intake and  output, last 24 h labs and trends, and last 24 h imaging results.  This care required moderate level of medical decision making.    Juliet Rude, [facility] 02/22/2024, 9:20 AM Please see Amion for pager number during day hours 7:00am-4:30pm

## 2024-02-22 NOTE — Progress Notes (Signed)
 PHARMACY - TOTAL PARENTERAL NUTRITION CONSULT NOTE   Indication: SBO  Assessment: 36 yoF admitted 2/7 after 2 weeks of progressively worsening reflux and abdominal distention with n/v. 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 Plan for bowel rest +/- venting G-tube while patient undergoes chemotherapy to hopefully relieve obstruction. Pharmacy consulted for TPN management.  Glucose / Insulin: no hx DM; CBG goal 100-180 -1 unit of insulin given in past 24 hours  Electrolytes: All stable WNL, including CorrCa (9.7) Renal: SCr stable WNL; BUN elevated Hepatic: LFTs improving, Alk Phos elevated - Tbili remains stable WNL - TG WNL 2/24 I/O: Continues to have improving bowel function as noted 2/20 per GynOnc (flatus, small BMs) - NG removed 2/22 - Stool x3 - UOP: 400 mL + 5 unmeasured - no mIVF GI Imaging: - 2/7 CT a/p: distal SBO d/t diffuse peritoneal carcinomatosis, with transition point in RLQ, hepatic and retroperitoneal metastases; incidental finding of rectal intussusception - 2/11 AXR: persistent but significantly improved SB dilation when compared with prior - 2/13 AXR: Worsening small bowel obstruction.  - 2/14 MRI pelvis: Abnormal amorphous soft tissue throughout lower uterine segment and cervix with ileocolic and likely ovarian mets. GI Surgeries / Procedures: NA  Central access: PICC TPN start date: 2/11  Nutritional Goals: Goal TPN rate is 70 mL/hr (provides 89 g of protein and 1717 kcals per day)  RD Assessment: Estimated Needs Total Energy Estimated Needs: 1625-1900 kcal Total Protein Estimated Needs: 80-95g Total Fluid Estimated Needs: 1.9L/day  Current Nutrition:  TPN, Diet advanced to clear liquid on 2/22  Plan:  Continue custom TPN @ goal rate of 70 mL/hr Electrolytes in TPN: No changes. Na - 80 mEq/L K - 40 mEq/L Ca - 3 mEq/L Mg - 5 mEq/L Phos - 15 mmol/L Cl:Ac ratio - 1:2 Add standard MVI and trace elements to TPN Continue to hold chromium for national  backorder mIVF per MD sSSI q8h Monitor TPN labs on Mon/Thurs BMP  with AM labs     Thank you for allowing pharmacy to be a part of this patient's care.  Selinda Eon, PharmD, BCPS Clinical Pharmacist [facility] 02/22/2024 7:16 AM

## 2024-02-22 NOTE — Progress Notes (Addendum)
 GYN Onc Progress Note  Subjective: Patient reports her abdomen as full and tight with drinking oral contrast. Reports having + flatus and having loose stools overnight. She has had been taking nausea medication on a regular basis when needed. No emesis. No pain reported. She has been ambulating without difficulty. No concerns voiced. Son in law at the bedside. Pt states she has not been able to get IS as high as previous values. Son in law also asking about decreasing losartan to 50 mg since she is taking 100 mg and her BPs are on the lower side.  Objective: Vital signs in last 24 hours: Temp:  [97.8 F (36.6 C)-98.3 F (36.8 C)] 98 F (36.7 C) (02/25 0634) Pulse Rate:  [71-78] 76 (02/25 0634) Resp:  [18-20] 20 (02/25 0634) BP: (95-116)/(54-58) 116/58 (02/25 0634) SpO2:  [93 %-94 %] 94 % (02/25 0634) Last BM Date : 02/21/24  Intake/Output from previous day: 02/24 0701 - 02/25 0700 In: 1348.5 [P.O.:440; I.V.:908.5] Out: 401 [Urine:400; Stool:1]  Physical Examination: Gen: NAD, alert and oriented;  Pulm: Breathing unlabored. Lungs clear, mildly diminished in the bases.  Heart in regular rate and rhythm Abd: more firm and tight, moderately distended, hyperactive BS, nontender, tympanic Ext: warm and well perfused, no edema or tenderness to palpation  Labs:    Latest Ref Rng & Units 02/22/2024    5:45 AM 02/21/2024    3:23 AM 02/20/2024    6:51 AM  CBC  WBC 4.0 - 10.5 K/uL 12.7  12.6  13.0   Hemoglobin 12.0 - 15.0 g/dL 8.8  9.5  9.7   Hematocrit 36.0 - 46.0 % 28.0  29.6  31.2   Platelets 150 - 400 K/uL 222  247  267       Latest Ref Rng & Units 02/22/2024    5:45 AM 02/21/2024    3:23 AM 02/19/2024    8:17 AM  BMP  Glucose 70 - 99 mg/dL 409  811  914   BUN 8 - 23 mg/dL 43  38  31   Creatinine 0.44 - 1.00 mg/dL 7.82  9.56  2.13   Sodium 135 - 145 mmol/L 137  136  137   Potassium 3.5 - 5.1 mmol/L 4.2  4.4  4.4   Chloride 98 - 111 mmol/L 104  103  104   CO2 22 - 32 mmol/L 23   25  25    Calcium 8.9 - 10.3 mg/dL 8.5  8.5  8.6    Echocardiogram performed 02/13/2024: EF 65-70%  Doppler from 02/12/24 with +DVT in LLE  Small bowel imaging on 02/18/2024. NG removed 02/19/24.  Assessment: 89 y.o. 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 Truett Perna. She has received granix as well. PE diagnosed on staging CT chest from 02/11/2024-currently on heparin drip.   Metastatic cancer, favored to represent ovarian or primary peritoneal cancer rather than endometrial.    Malignant bowel obstruction: Currently drinking contrast for CT scan today. Will follow up on results. Having intermittent nausea. Possibility of future G-tube placement had been previously discussed if unable to tolerate diet.  Pulmonary embolism: Dr. Pricilla Holm previously discussed with the patient hypercoagulable state that cancer causes. Doppler from 02/12/2024 +for DVT in LLE. Recommend no use of SCDs.  Patient started on heparin drip 02/11/24.  Goal would be to transition to DOAC once she is able to take p.o. and ready for discharge.   LOS: 18  days    Tracy Preston 02/22/2024, 8:09 AM

## 2024-02-23 ENCOUNTER — Other Ambulatory Visit: Payer: Self-pay

## 2024-02-23 DIAGNOSIS — C786 Secondary malignant neoplasm of retroperitoneum and peritoneum: Secondary | ICD-10-CM | POA: Diagnosis not present

## 2024-02-23 DIAGNOSIS — Z7189 Other specified counseling: Secondary | ICD-10-CM | POA: Diagnosis not present

## 2024-02-23 DIAGNOSIS — C801 Malignant (primary) neoplasm, unspecified: Secondary | ICD-10-CM | POA: Diagnosis not present

## 2024-02-23 DIAGNOSIS — C787 Secondary malignant neoplasm of liver and intrahepatic bile duct: Secondary | ICD-10-CM | POA: Diagnosis not present

## 2024-02-23 DIAGNOSIS — R112 Nausea with vomiting, unspecified: Secondary | ICD-10-CM

## 2024-02-23 DIAGNOSIS — Z515 Encounter for palliative care: Secondary | ICD-10-CM | POA: Diagnosis not present

## 2024-02-23 LAB — CBC WITH DIFFERENTIAL/PLATELET
Abs Immature Granulocytes: 1.02 10*3/uL — ABNORMAL HIGH (ref 0.00–0.07)
Basophils Absolute: 0.1 10*3/uL (ref 0.0–0.1)
Basophils Relative: 0 %
Eosinophils Absolute: 0 10*3/uL (ref 0.0–0.5)
Eosinophils Relative: 0 %
HCT: 27.6 % — ABNORMAL LOW (ref 36.0–46.0)
Hemoglobin: 9 g/dL — ABNORMAL LOW (ref 12.0–15.0)
Immature Granulocytes: 8 %
Lymphocytes Relative: 19 %
Lymphs Abs: 2.4 10*3/uL (ref 0.7–4.0)
MCH: 30.9 pg (ref 26.0–34.0)
MCHC: 32.6 g/dL (ref 30.0–36.0)
MCV: 94.8 fL (ref 80.0–100.0)
Monocytes Absolute: 0.9 10*3/uL (ref 0.1–1.0)
Monocytes Relative: 7 %
Neutro Abs: 8.4 10*3/uL — ABNORMAL HIGH (ref 1.7–7.7)
Neutrophils Relative %: 66 %
Platelets: 223 10*3/uL (ref 150–400)
RBC: 2.91 MIL/uL — ABNORMAL LOW (ref 3.87–5.11)
RDW: 13.4 % (ref 11.5–15.5)
WBC: 12.8 10*3/uL — ABNORMAL HIGH (ref 4.0–10.5)
nRBC: 0.2 % (ref 0.0–0.2)

## 2024-02-23 LAB — BASIC METABOLIC PANEL
Anion gap: 9 (ref 5–15)
BUN: 34 mg/dL — ABNORMAL HIGH (ref 8–23)
CO2: 23 mmol/L (ref 22–32)
Calcium: 8.3 mg/dL — ABNORMAL LOW (ref 8.9–10.3)
Chloride: 101 mmol/L (ref 98–111)
Creatinine, Ser: 0.81 mg/dL (ref 0.44–1.00)
GFR, Estimated: >60 mL/min (ref >60)
Glucose, Bld: 101 mg/dL — ABNORMAL HIGH (ref 70–99)
Potassium: 4.5 mmol/L (ref 3.5–5.1)
Sodium: 133 mmol/L — ABNORMAL LOW (ref 135–145)

## 2024-02-23 LAB — HEPARIN LEVEL (UNFRACTIONATED): Heparin Unfractionated: 0.52 [IU]/mL (ref 0.30–0.70)

## 2024-02-23 LAB — GLUCOSE, CAPILLARY
Glucose-Capillary: 110 mg/dL — ABNORMAL HIGH (ref 70–99)
Glucose-Capillary: 114 mg/dL — ABNORMAL HIGH (ref 70–99)
Glucose-Capillary: 218 mg/dL — ABNORMAL HIGH (ref 70–99)

## 2024-02-23 LAB — CA 125: Cancer Antigen (CA) 125: 623 U/mL — ABNORMAL HIGH (ref 0.0–38.1)

## 2024-02-23 MED ORDER — OCTREOTIDE ACETATE 100 MCG/ML IJ SOLN
100.0000 ug | Freq: Three times a day (TID) | INTRAMUSCULAR | Status: DC
  Administered 2024-02-23 – 2024-03-01 (×21): 100 ug via SUBCUTANEOUS
  Filled 2024-02-23 (×21): qty 1

## 2024-02-23 MED ORDER — TRAVASOL 10 % IV SOLN
INTRAVENOUS | Status: AC
  Filled 2024-02-23: qty 890.4

## 2024-02-23 NOTE — Plan of Care (Signed)
  Problem: Education: Goal: Knowledge of General Education information will improve Description: Including pain rating scale, medication(s)/side effects and non-pharmacologic comfort measures Outcome: Progressing   Problem: Health Behavior/Discharge Planning: Goal: Ability to manage health-related needs will improve Outcome: Progressing   Problem: Clinical Measurements: Goal: Ability to maintain clinical measurements within normal limits will improve Outcome: Progressing Goal: Will remain free from infection Outcome: Progressing Goal: Diagnostic test results will improve Outcome: Progressing   Problem: Activity: Goal: Risk for activity intolerance will decrease Outcome: Progressing   Problem: Pain Managment: Goal: General experience of comfort will improve and/or be controlled Outcome: Progressing   Problem: Safety: Goal: Ability to remain free from injury will improve Outcome: Progressing   Problem: Skin Integrity: Goal: Risk for impaired skin integrity will decrease Outcome: Progressing

## 2024-02-23 NOTE — Progress Notes (Signed)
 PROGRESS NOTE  Tracy Preston  ZOX:[id number] DOB: 1935/04/28 DOA: 02/04/2024 PCP: Marden Noble, MD (Inactive)   Brief Narrative: Patient is a 89 year old female with history of hypertension, breast cancer who presented with abdominal distention, vomiting for 2 weeks.  CT abdomen was concerning for new metastatic disease from possible GYN origin, SBO.  Further workup showed peritoneal carcinomatosis with likely GYN malignancy.  GYN oncology, general surgery consulted pelvic MRI showed advanced GYN malignancy.  CT chest with contrast showed distal pulmonary present with cor pulmonale, started on heparin drip.  Received first cycle of chemo on 2/15.  Hospital course remarkable for persistent bowel obstruction, started on TPN, Decadron.  Palliative care following.  Now having bowel movements.  Small bowel SBO protocol shows nondilated colon with contrast on entire colon. NG tube removed on 2/22, started on clears.  After that she developed intermittent nausea, andominal distention.  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  CT chest with contrast showed distal PE, started on heparin drip.  Received first cycle of chemo on 2/15.  Hospital  course remarkable for persistent bowel obstruction, started on TPN, Decadron. 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  Small bowel SBO protocol on 2/22 showed nondilated colon with contrast on entire colon. NG tube removed.  She is having bowel movements and passing gas but has intermittent nausea,  abdominal distention.  CT abdomen/pelvis done on 2/25 did not show any significant changes from last time.   Plan for consulting IR for PEG vent  Right-sided distal PE: Started on heparin drip.  Lower EXTR Doppler positive for DVT.  Eventual plan is to transition to Eliquis.  Echo showed preserved EF  Leukocytosis: She received Granix by oncology. Improving.   No fever.  Poor nutritional status/severe protein calorie malnutrition: On TPN  Hypertension: Blood pressure soft so losartan will be discontinued for now  GERD: Continue PPI  Normocytic anemia: Currently hemoglobin stable  Goals of care:Palliative care was following for goals of care .CODE STATUS DNR   Nutrition Problem: Moderate Malnutrition Etiology: acute illness    DVT prophylaxis:iv heparin     Code Status: Limited: Do not attempt resuscitation (DNR) -DNR-LIMITED -Do Not Intubate/DNI   Family Communication:  discussed with son-in-law Dr. Venetia Maxon on at bedside on 2/25.  Discussed with daughter at bedside on 2/26   Patient status:Inpatient  Patient is from :Home  Anticipated discharge WJ:XBJY  Estimated DC date: Not sure at the moment   Consultants: General Surgery, palliative care, oncology  Procedures: None yet  Antimicrobials:  Anti-infectives (From admission, onward)    None       Subjective: Patient seen and examined at bedside today.  She feels better today.  She had a bowel movement this morning.  No nausea or vomiting this morning.  Abdomen is less distended than yesterday.  Objective: Vitals:   02/22/24 0634 02/22/24 1333 02/22/24  2033 02/23/24 0453  BP: (!) 116/58 (!) 152/69 134/66 (!) 110/48  Pulse: 76 79 76 74  Resp: 20 17 16 17   Temp: 98 F (36.7 C) 97.6 F (36.4 C) 98.2 F (36.8 C) (!) 97.4 F (36.3 C)  TempSrc:  Oral Oral Oral  SpO2: 94% 95% 95% 95%  Weight:      Height:        Intake/Output Summary (Last 24 hours) at 02/23/2024 1238 Last data filed at 02/23/2024 1610 Gross per 24 hour  Intake 1624.08 ml  Output --  Net 1624.08 ml   Filed Weights   02/18/24 0715 02/19/24 0713 02/21/24 0500  Weight: 57.5 kg 56.1 kg 57.2 kg    Examination:    General exam: Overall comfortable, not in distress, pleasant elderly female HEENT: PERRL Respiratory system:  no wheezes or crackles  Cardiovascular system: S1 & S2 heard, RRR.  Gastrointestinal system: Abdomen is mildly distended, nontender,soft Central nervous system: Alert and oriented Extremities: No edema, no clubbing ,no cyanosis,picc line Skin: No rashes, no ulcers,no icterus     Data Reviewed: I have personally reviewed following labs and imaging studies  CBC: Recent Labs  Lab 02/19/24 0817 02/20/24 0651 02/21/24 0323 02/22/24 0545 02/23/24 0636  WBC 18.3* 13.0* 12.6* 12.7* 12.8*  NEUTROABS 13.0* 10.7* 8.3* 8.3* 8.4*  HGB 9.9* 9.7* 9.5* 8.8* 9.0*  HCT 32.1* 31.2* 29.6* 28.0* 27.6*  MCV 98.5 98.4 96.1 97.9 94.8  PLT 271 267 247 222 223   Basic Metabolic Panel: Recent Labs  Lab 02/17/24 0552 02/19/24 0817 02/21/24 0323 02/22/24 0545 02/23/24 0636  NA 137 137 136 137 133*  K 4.2 4.4 4.4 4.2 4.5  CL 105 104 103 104 101  CO2 23 25 25 23 23   GLUCOSE 119* 103* 109* 107* 101*  BUN 35* 31* 38* 43* 34*  CREATININE 0.71 0.79 0.83 0.93 0.81  CALCIUM 8.2* 8.6* 8.5* 8.5* 8.3*  MG 2.1  --  2.0 2.1  --   PHOS 3.2  --  4.0 3.4  --      No results found for this or any previous visit (from the past 240 hours).   Radiology Studies: CT ABDOMEN PELVIS W CONTRAST Result Date: 02/22/2024 CLINICAL DATA:  Ovarian cancer, bowel  obstruction, restaging. * Tracking Code: BO * EXAM: CT ABDOMEN AND PELVIS WITH CONTRAST TECHNIQUE: Multidetector CT imaging of the abdomen and pelvis was performed using the standard protocol following bolus administration of intravenous contrast. RADIATION DOSE REDUCTION: This exam was performed according to the departmental dose-optimization program which includes automated exposure control, adjustment of the mA and/or kV according to patient size and/or use of iterative reconstruction technique. CONTRAST:  OMNIPAQUE IOHEXOL 300 MG/ML  SOLN COMPARISON:  Abdominopelvic CT 02/04/2024, pelvic MRI 02/11/2024 and chest CT 02/11/2024. FINDINGS: Lower chest: Small right-greater-than-left pleural effusions with dependent atelectasis at both lung bases, new from 02/04/2024 and increased from 02/11/2024. Contrast filled and mildly distended distal esophagus. Hepatobiliary: Multiple hypoenhancing hepatic metastases are again noted. The largest lesion superior to the gallbladder is less well-defined on the current study and may be slightly smaller, measuring 1.4 cm on image 26/2 (previously 1.9 x 1.5 cm). Other lesions are grossly stable, including a lesion more superiorly in the central right lobe measuring 1.4 cm on image 18/2. Small hepatic cysts are also noted. No evidence of gallstones, gallbladder wall thickening or biliary dilatation. Pancreas: Unremarkable.  No pancreatic ductal dilatation or surrounding inflammatory changes. Spleen: Stable 8 mm low-density splenic lesion on image 24/2, likely a cyst. The spleen is normal in size without other focal abnormality. Adrenals/Urinary Tract: Both adrenal glands appear normal. No evidence of urinary tract calculus, suspicious renal lesion or hydronephrosis. The right renal pelvis is mildly dilated without secondary signs of ureteral obstruction. There are renal sinus cysts bilaterally. The bladder appears unremarkable for its degree of distention. Stomach/Bowel: Enteric  contrast has passed into the mid colon. The stomach remains mildly distended. Small bowel distension has mildly improved. The colon remains decompressed. There is a persistent distal colonic intussusception with the sigmoid colon invaginated into the rectum. Appearance is similar to the previous CT from 02/04/2024. No evidence of bowel perforation. Vascular/Lymphatic: Small retroperitoneal and mesenteric lymph nodes appear unchanged. Mild aortic and branch vessel atherosclerosis without evidence of aneurysm or large vessel occlusion. The portal, superior mesenteric and splenic veins remain patent. Reproductive: Persistent ill-defined mass involving the cervix, measuring approximately 3.4 x 4.0 cm on image 62/2. There is mild distension of the endometrial cavity. A small peripherally calcified uterine fundal fibroid is noted. Persistent ill-defined adnexal masses bilaterally, measuring approximately 4.0 x 2.6 cm on the right (image 54/2) and 2.9 x 1.9 cm on the left (image 57/2). Other: Small to moderate volume of ascites, similar to previous CT. Diffuse omental nodularity may be minimally improved in the interval with a dominant component in the right mid abdomen measuring 3.8 x 2.3 cm on image 39/2. No extravasated enteric contrast or pneumoperitoneum. Musculoskeletal: No acute or significant osseous findings. Mild lumbar spondylosis. IMPRESSION: 1. Persistent distal colonic intussusception with the sigmoid colon invaginated into the rectum. Appearance is similar to the previous CT from 02/04/2024 and may relate to underlying peritoneal tumor. 2. Mild improvement in distal partial small bowel obstruction with enteric contrast passing into the mid colon. The colon remains decompressed. 3. Persistent ill-defined cervical and bilateral adnexal masses consistent with known ovarian cancer. Associated peritoneal carcinomatosis may be minimally improved. No definite progressive disease. 4. Persistent hepatic metastases,  one of which may be slightly smaller in the interval. 5. Small right-greater-than-left pleural effusions with dependent atelectasis at both lung bases, new from 02/04/2024 and increased from 02/11/2024. 6.  Aortic Atherosclerosis (ICD10-I70.0). Electronically Signed   By: Carey Bullocks M.D.   On: 02/22/2024 16:08     Scheduled Meds:  Chlorhexidine Gluconate Cloth  6 each Topical Daily   dexamethasone (DECADRON) injection  8 mg Intravenous Daily   insulin aspart  0-9 Units Subcutaneous Q8H   octreotide  100 mcg Subcutaneous Q8H   pantoprazole (PROTONIX) IV  40 mg Intravenous Q12H   sodium chloride flush  10-40 mL Intracatheter Q12H   Continuous Infusions:  heparin 900 Units/hr (02/23/24 0712)   TPN ADULT (ION) 70 mL/hr at 02/23/24 0322   TPN ADULT (ION)       LOS: 19 days   Burnadette Pop, MD [facility] P2/26/2025, 12:38 PM

## 2024-02-23 NOTE — Progress Notes (Signed)
 Daily Progress Note   Patient Name: Tracy Preston       Date: 02/23/2024 DOB: 06-Sep-1935  Age: 89 y.o. MRN#: [id number] Attending Physician: Tracy Pop, MD Primary Care Physician: Tracy Noble, MD (Inactive) Admit Date: 02/04/2024 Length of Stay: 19 days  Reason for Consultation/Follow-up: Establishing goals of care  Subjective:   Reviewed EMR prior to presenting to bedside.  Patient had repeat CT A/P 02/22/2024 which showed persistent distal colonic interception similar to CT on 02/04/2024 which may relate to underlying peritoneal tumor and some mild improvement in SBO.  Presented to bedside to meet with patient.  No family present at bedside.  Patient remembers provider from prior interactions.  Patient spent time updating me on hospital course since last seen.  Patient noted that the CT scan from yesterday was reviewed with her.  Patient noted that even though she heard there was some mild improvement in her SBO, she is still having lots of nausea and symptom burden.  She noted that plan is to discuss with IR provider the risk and benefits of a possible venting G-tube.  Patient notes that she would like all this information and for her family to have this information as well.  Patient will take this information to determine if she would like to consider further intervention.  Patient continues to emphasize that her quality of life is more important than her quantity of life.  Patient does not want to feel that she is just getting procedure after procedure after procedure that is not leading to quality outcomes in her eyes.  Acknowledged patient's thoughts regarding this and noted palliative medicine team will continue to follow will be involved to assist in communication and supporting patient's wishes for medical care moving forward.  Patient in particular notes that her family will be present this weekend so noted palliative medicine team would attempt to visit if able.  Did discuss  symptom management of iron at this time.  Patient's primary concern is her nausea as this symptom is very bothersome to her.  Discussed starting octreotide scheduled during the day as an injection to determine if this would help with symptom burden and SBO management.  Patient agreement with this plan.  Patient denied concerns of pain at this time.  Spent time providing emotional support reactive listening.  Noted palliative medicine team continuing to follow along with medical journey.  Discussed care with IDT including hospitalist, RN, oncology, and gyn onc after visit.  Objective:   Vital Signs:  BP (!) 110/48 (BP Location: Left Arm)   Pulse 74   Temp (!) 97.4 F (36.3 C) (Oral)   Resp 17   Ht 5\' 4"  (1.626 m)   Wt 57.2 kg   SpO2 95%   BMI 21.65 kg/m   Physical Exam:  General: NAD, alert, appears fatigued Cardiovascular: RRR, no edema in LE b/l Respiratory: no increased work of breathing noted, not in respiratory distress Neuro: A&Ox4, following commands easily Psych: appropriately answers all questions  Imaging: I personally reviewed recent imaging.   Assessment & Plan:   Assessment: Patient is a 89 year old female with past medical history of hypertension, CKD, and family history of breast cancer who was admitted on 02/04/2024 for management of abdominal distention and vomiting for approximately 2 weeks.  Imaging concerning for new metastatic gynecological malignancy with small bowel obstruction.  Imaging also showed likely metastatic disease to liver and peritoneal carcinomatosis.  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  Gyn Onc and oncology consulted  for recommendations.  Palliative medicine team consulted to assist with complex medical decision making.   Recommendations/Plan: # Complex medical decision making/goals of care: -Discussed care with patient as detailed above in HPI.  At this time continuing appropriate medical interventions.  Patient  wanting to obtain further information about possibility of venting G-tube before making decision regarding to proceed with this intervention.  Patient wants to continue open discussions to best support her quality of life moving forward as patient has noted multiple times that her quality of life is more important to her than her quantity of life.                 -  Code Status: Limited: Do not attempt resuscitation (DNR) -DNR-LIMITED -Do Not Intubate/DNI    # Symptom management Patient is receiving these palliative interventions for symptom management with an intent to improve quality of life.                 -Malignant small bowel obstruction secondary to peritoneal carcinomatosis                               -Receiving IV dexamethasone 8 mg daily   -Start scheduled octreotide 100 mcg every 8 subcu.  May need to consider continuous infusion.  # Psycho-social/Spiritual Support:  - Support System: daughters, son-in-law, 8 grandchildren    # Discharge Planning:  To Be Determined  -Have already placed outpatient PMT referral to follow-up at [facility] with hope patient will be able to complete this.  Discussed with: Patient, RN, hospitalist, oncology, gyn onc  Thank you for allowing the palliative care team to participate in the care Tracy Preston.  Tracy Morin, DO Palliative Care Provider PMT # [phone]  If patient remains symptomatic despite maximum doses, please call PMT at [phone] between 0700 and 1900. Outside of these hours, please call attending, as PMT does not have night coverage.  Personally spent 50 minutes in patient care including extensive chart review (labs, imaging, progress/consult notes, vital signs), medically appropraite exam, discussed with treatment team, education to patient, family, and staff, documenting clinical information, medication review and management, coordination of care, and available advanced directive documents.

## 2024-02-23 NOTE — Progress Notes (Signed)
 GYN Onc Progress Note  Subjective: Patient reports abdomen feels less full. Had episode of emesis last pm with nausea. Continues to have bowel movements and pass flatus. No pain reported. Son in law at the bedside. Discussed CT scan. The patient and son in law voice desire to talk with IR about possible G tube placement (risks, benefits, if possibility to be done by IR). She would not like to have to undergo surgery for placement of this if possible.  Objective: Vital signs in last 24 hours: Temp:  [97.4 F (36.3 C)-98.2 F (36.8 C)] 97.4 F (36.3 C) (02/26 0453) Pulse Rate:  [74-79] 74 (02/26 0453) Resp:  [16-17] 17 (02/26 0453) BP: (110-152)/(48-69) 110/48 (02/26 0453) SpO2:  [95 %] 95 % (02/26 0453) Last BM Date : 02/21/24  Intake/Output from previous day: 02/25 0701 - 02/26 0700 In: 1624.1 [I.V.:1624.1] Out: -   Physical Examination: Gen: NAD, alert and oriented;  Pulm: Breathing unlabored. Lungs clear.  Heart in regular rate and rhythm Abd: less firm compared to 02/22/24 assessment, remains distended (less compared with yesterday's assessment), active BS, nontender, tympanic Ext: warm and well perfused, no edema or tenderness to palpation  Labs:    Latest Ref Rng & Units 02/23/2024    6:36 AM 02/22/2024    5:45 AM 02/21/2024    3:23 AM  CBC  WBC 4.0 - 10.5 K/uL 12.8  12.7  12.6   Hemoglobin 12.0 - 15.0 g/dL 9.0  8.8  9.5   Hematocrit 36.0 - 46.0 % 27.6  28.0  29.6   Platelets 150 - 400 K/uL 223  222  247       Latest Ref Rng & Units 02/23/2024    6:36 AM 02/22/2024    5:45 AM 02/21/2024    3:23 AM  BMP  Glucose 70 - 99 mg/dL 573  220  254   BUN 8 - 23 mg/dL 34  43  38   Creatinine 0.44 - 1.00 mg/dL 2.70  6.23  7.62   Sodium 135 - 145 mmol/L 133  137  136   Potassium 3.5 - 5.1 mmol/L 4.5  4.2  4.4   Chloride 98 - 111 mmol/L 101  104  103   CO2 22 - 32 mmol/L 23  23  25    Calcium 8.9 - 10.3 mg/dL 8.3  8.5  8.5    Echocardiogram performed 02/13/2024: EF  65-70%  Doppler from 02/12/24 with +DVT in LLE  Small bowel imaging on 02/18/2024. NG removed 02/19/24. CT AP with contrast on 02/22/2024 with persistent distal colonic intussusception, mild improvement in distal partial small bowel obstruction.  Assessment: 89 y.o. 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 Truett Perna. She has received granix as well. PE diagnosed on staging CT chest from 02/11/2024-currently on heparin drip.   Metastatic cancer, favored to represent ovarian or primary peritoneal cancer rather than endometrial.    Malignant bowel obstruction: CT 02/22/2024. Episode of emesis overnight. Patient and family would like to discuss risks, expectations of possible of future G-tube placement with IR.  Pulmonary embolism: Dr. Pricilla Holm previously discussed with the patient hypercoagulable state that cancer causes. Doppler from 02/12/2024 +for DVT in LLE. Recommend no use of SCDs.  Patient started on heparin drip 02/11/24.  Goal would be to transition to DOAC once she is able to take p.o. and ready for discharge.   LOS: 19 days    Leone Putman D Ayodele Sangalang  02/23/2024, 9:38 AM

## 2024-02-23 NOTE — Progress Notes (Addendum)
 Tracy Preston   DOB:06-Feb-1935   WG#:[id number]      ASSESSMENT & PLAN:  1.  Diffuse peritoneal carcinomatosis with mild ascites Likely primary cervical, ovarian or endometrial carcinoma with liver mets and bilateral adnexal masses. - Newly diagnosed February 2025 - CT scan 02/04/2024 shows diffuse peritoneal carcinomatosis and mild ascites. - Pelvic ultrasound 02/05/2024 shows 2 cm heterogeneous area at the uterine fundus, endometrial malignancy not excluded. - S/p paracentesis 02/05/2024, 100 mL of yellow fluid removed. 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 -MRI pelvis and CT chest for staging done on 02/11/2024.  Shows abnormal soft tissue throughout the lower uterine and cervix could represent primary uterine/cervical carcinoma or mets. - S/p chemotherapy with carboplatin/Taxol initiated on 02/12/2024.  Tolerated well. - S/p Granix 300 daily x 5 completed 02/17/2024.  - GYN Onc following closely.  - Palliative team following - Medical oncology/Dr. Truett Perna following closely.   2.  Small bowel obstruction - Distal SBO with transition point in the RLQ   - Likely due to peritoneal carcinomatosis - NGT discontinued - TPN infusing well - S/p CT a/p 02/22/24.  Showed persistent distal colonic intussusception, similar to CT of 02/04/2024 and may relate to underlying peritoneal tumor.  There was mild improvement in SBO. - Management per surgery   3.  Acute PE DVT, LLE - Per CT of the chest for staging purposes done 02/11/2024.  Showed incidental acute PE involving the distal right pulmonary artery and lobar and segmental RUL and RML pulmonary artery branches.  Small right pleural effusion. - Remains on IV heparin, continue per protocol.  Will consider switching to Eliquis when patient tolerating oral better.  - Monitor closely for active bleeding   4.  Anemia, normocytic - HGB stable 9.0 today     - Transfuse  PRBC for hemoglobin <7.0. - No transfusional intervention warranted at this time. -Monitor CBC with differential every 2 days    5.  Nausea/vomiting - Continues on and off. One episode overnight.  - Administer antiemetics Compazine as needed and Zofran ordered   6.  Hypertension - BP stable today - Monitor BP counts - Primary team following        Code Status DNR-Limited  Subjective:  Patient seen awake and alert laying in bed.  States she feels a little better than she did yesterday. Continues with small amounts of bowel movement and gas, states she had one episode of vomiting overnight and felt relief, thinks related to oral contrast from scan yesterday.  No other acute distress noted.   Objective:  Vitals:   02/22/24 2033 02/23/24 0453  BP: 134/66 (!) 110/48  Pulse: 76 74  Resp: 16 17  Temp: 98.2 F (36.8 C) (!) 97.4 F (36.3 C)  SpO2: 95% 95%     Intake/Output Summary (Last 24 hours) at 02/23/2024 5784 Last data filed at 02/23/2024 6962 Gross per 24 hour  Intake 1624.08 ml  Output --  Net 1624.08 ml     REVIEW OF SYSTEMS:   Constitutional: +fatigue, Denies fevers, chills or abnormal night sweats Eyes: Denies blurriness of vision, double vision or watery eyes Ears, nose, mouth, throat, and face: Denies mucositis or sore throat Respiratory: Denies cough, dyspnea or wheezes Cardiovascular: Denies palpitation, chest discomfort or lower extremity swelling Gastrointestinal: +abdominal distention Skin: Denies abnormal skin rashes Lymphatics: Denies new lymphadenopathy or easy bruising Neurological: Denies numbness, tingling or new weaknesses Behavioral/Psych: Mood is stable, no new changes  All other systems were reviewed with the patient and are negative.  PHYSICAL EXAMINATION: ECOG PERFORMANCE STATUS: 2 - Symptomatic, <50% confined to bed  Vitals:   02/22/24 2033 02/23/24 0453  BP: 134/66 (!) 110/48  Pulse: 76 74  Resp: 16 17  Temp: 98.2 F (36.8 C) (!)  97.4 F (36.3 C)  SpO2: 95% 95%   Filed Weights   02/18/24 0715 02/19/24 0713 02/21/24 0500  Weight: 126 lb 12.2 oz (57.5 kg) 123 lb 10.9 oz (56.1 kg) 126 lb 1.7 oz (57.2 kg)    GENERAL: alert, no distress and comfortable SKIN: +pale skin color, texture, turgor are normal, no rashes or significant lesions EYES: normal, conjunctiva are pink and non-injected, sclera clear OROPHARYNX: no exudate, no erythema and lips, buccal mucosa, and tongue normal  NECK: supple, thyroid normal size, non-tender, without nodularity LYMPH: no palpable lymphadenopathy in the cervical, axillary or inguinal LUNGS: clear to auscultation and percussion with normal breathing effort HEART: regular rate & rhythm and no murmurs and no lower extremity edema ABDOMEN: +abdominal distention MUSCULOSKELETAL: no cyanosis of digits and no clubbing  PSYCH: alert & oriented x 3 with fluent speech NEURO: no focal motor/sensory deficits   All questions were answered. The patient knows to call the clinic with any problems, questions or concerns.   The total time spent in the appointment was 40 minutes encounter with patient including review of chart and various tests results, discussions about plan of care and coordination of care plan  Dawson Bills, NP 02/23/2024 9:25 AM    Labs Reviewed:  Lab Results  Component Value Date   WBC 12.8 (H) 02/23/2024   HGB 9.0 (L) 02/23/2024   HCT 27.6 (L) 02/23/2024   MCV 94.8 02/23/2024   PLT 223 02/23/2024   Recent Labs    02/17/24 0552 02/19/24 0817 02/21/24 0323 02/22/24 0545 02/23/24 0636  NA 137 137 136 137 133*  K 4.2 4.4 4.4 4.2 4.5  CL 105 104 103 104 101  CO2 23 25 25 23 23   GLUCOSE 119* 103* 109* 107* 101*  BUN 35* 31* 38* 43* 34*  CREATININE 0.71 0.79 0.83 0.93 0.81  CALCIUM 8.2* 8.6* 8.5* 8.5* 8.3*  GFRNONAA >60 >60 >60 60* >60  PROT 5.3* 6.0* 5.6*  --   --   ALBUMIN 2.5* 2.6* 2.5*  --   --   AST 62* 37 30  --   --   ALT 110* 79* 59*  --   --    ALKPHOS 184* 246* 231*  --   --   BILITOT 0.4 0.3 0.4  --   --     Studies Reviewed:  CT ABDOMEN PELVIS W CONTRAST Result Date: 02/22/2024 CLINICAL DATA:  Ovarian cancer, bowel obstruction, restaging. * Tracking Code: BO * EXAM: CT ABDOMEN AND PELVIS WITH CONTRAST TECHNIQUE: Multidetector CT imaging of the abdomen and pelvis was performed using the standard protocol following bolus administration of intravenous contrast. RADIATION DOSE REDUCTION: This exam was performed according to the departmental dose-optimization program which includes automated exposure control, adjustment of the mA and/or kV according to patient size and/or use of iterative reconstruction technique. CONTRAST:  OMNIPAQUE IOHEXOL 300 MG/ML  SOLN COMPARISON:  Abdominopelvic CT 02/04/2024, pelvic MRI 02/11/2024 and chest CT 02/11/2024. FINDINGS: Lower chest: Small right-greater-than-left pleural effusions with dependent atelectasis at both lung bases, new from 02/04/2024 and increased from 02/11/2024. Contrast filled and mildly distended distal esophagus. Hepatobiliary: Multiple hypoenhancing hepatic metastases are again noted. The largest lesion  superior to the gallbladder is less well-defined on the current study and may be slightly smaller, measuring 1.4 cm on image 26/2 (previously 1.9 x 1.5 cm). Other lesions are grossly stable, including a lesion more superiorly in the central right lobe measuring 1.4 cm on image 18/2. Small hepatic cysts are also noted. No evidence of gallstones, gallbladder wall thickening or biliary dilatation. Pancreas: Unremarkable. No pancreatic ductal dilatation or surrounding inflammatory changes. Spleen: Stable 8 mm low-density splenic lesion on image 24/2, likely a cyst. The spleen is normal in size without other focal abnormality. Adrenals/Urinary Tract: Both adrenal glands appear normal. No evidence of urinary tract calculus, suspicious renal lesion or hydronephrosis. The right renal pelvis is  mildly dilated without secondary signs of ureteral obstruction. There are renal sinus cysts bilaterally. The bladder appears unremarkable for its degree of distention. Stomach/Bowel: Enteric contrast has passed into the mid colon. The stomach remains mildly distended. Small bowel distension has mildly improved. The colon remains decompressed. There is a persistent distal colonic intussusception with the sigmoid colon invaginated into the rectum. Appearance is similar to the previous CT from 02/04/2024. No evidence of bowel perforation. Vascular/Lymphatic: Small retroperitoneal and mesenteric lymph nodes appear unchanged. Mild aortic and branch vessel atherosclerosis without evidence of aneurysm or large vessel occlusion. The portal, superior mesenteric and splenic veins remain patent. Reproductive: Persistent ill-defined mass involving the cervix, measuring approximately 3.4 x 4.0 cm on image 62/2. There is mild distension of the endometrial cavity. A small peripherally calcified uterine fundal fibroid is noted. Persistent ill-defined adnexal masses bilaterally, measuring approximately 4.0 x 2.6 cm on the right (image 54/2) and 2.9 x 1.9 cm on the left (image 57/2). Other: Small to moderate volume of ascites, similar to previous CT. Diffuse omental nodularity may be minimally improved in the interval with a dominant component in the right mid abdomen measuring 3.8 x 2.3 cm on image 39/2. No extravasated enteric contrast or pneumoperitoneum. Musculoskeletal: No acute or significant osseous findings. Mild lumbar spondylosis. IMPRESSION: 1. Persistent distal colonic intussusception with the sigmoid colon invaginated into the rectum. Appearance is similar to the previous CT from 02/04/2024 and may relate to underlying peritoneal tumor. 2. Mild improvement in distal partial small bowel obstruction with enteric contrast passing into the mid colon. The colon remains decompressed. 3. Persistent ill-defined cervical and  bilateral adnexal masses consistent with known ovarian cancer. Associated peritoneal carcinomatosis may be minimally improved. No definite progressive disease. 4. Persistent hepatic metastases, one of which may be slightly smaller in the interval. 5. Small right-greater-than-left pleural effusions with dependent atelectasis at both lung bases, new from 02/04/2024 and increased from 02/11/2024. 6.  Aortic Atherosclerosis (ICD10-I70.0). Electronically Signed   By: Carey Bullocks M.D.   On: 02/22/2024 16:08   DG Abd Portable 1V-Small Bowel Obstruction Protocol-initial, 8 hr delay Result Date: 02/18/2024 CLINICAL DATA:  8 hour delay small bowel obstruction EXAM: PORTABLE ABDOMEN - 1 VIEW COMPARISON:  Abdominal x-ray 02/18/2024 FINDINGS: Enteric tube tip is in the fundus of the stomach with sidehole at the level of the gastroesophageal junction. Contrast is seen throughout nondilated colon to the level of the rectum. No dilated bowel loops are seen. IMPRESSION: 1. Enteric tube tip is in the fundus of the stomach with sidehole at the level of the gastroesophageal junction. Recommend advancement 8 cm. 2. Contrast is seen throughout nondilated colon to the level of the rectum. No evidence for bowel obstruction. Electronically Signed   By: Darliss Cheney M.D.   On: 02/18/2024 20:57  DG Abd Portable 1V-Small Bowel Protocol-Position Verification Result Date: 02/18/2024 CLINICAL DATA:  [id number] Encounter for imaging study to confirm nasogastric (NG) tube placement [id number] EXAM: PORTABLE ABDOMEN - 1 VIEW COMPARISON:  02/10/2024 abdominal radiograph FINDINGS: Enteric tube terminates in the proximal stomach. No dilated small bowel loops. No evidence of pneumatosis or pneumoperitoneum. Mild elevation of the left hemidiaphragm with clear lung bases. IMPRESSION: Enteric tube terminates in the proximal stomach. Electronically Signed   By: Delbert Phenix M.D.   On: 02/18/2024 13:42   ECHOCARDIOGRAM COMPLETE Result Date:  02/13/2024    ECHOCARDIOGRAM REPORT   Patient Name:   Tracy Preston Date of Exam: 02/13/2024 Medical Rec #:  [id number]          Height:       64.0 in Accession #:    [id number]         Weight:       118.9 lb Date of Birth:  1935/08/22          BSA:          1.568 m Patient Age:    88 years           BP:           142/72 mmHg Patient Gender: F                  HR:           71 bpm. Exam Location:  Inpatient Procedure: 2D Echo, Cardiac Doppler and Color Doppler (Both Spectral and Color            Flow Doppler were utilized during procedure). Indications:    Cardiomyopathy- Unspecified I42.9  History:        Patient has no prior history of Echocardiogram examinations.                 Risk Factors:Hypertension.  Sonographer:    Lucendia Herrlich RCS Referring Phys: [id number] Doreen Salvage AMIN IMPRESSIONS  1. Left ventricular ejection fraction, by estimation, is 65 to 70%. The left ventricle has normal function. The left ventricle has no regional wall motion abnormalities. Left ventricular diastolic parameters were normal.  2. Right ventricular systolic function is normal. The right ventricular size is normal. There is normal pulmonary artery systolic pressure. The estimated right ventricular systolic pressure is 23.8 mmHg.  3. The mitral valve is grossly normal. No evidence of mitral valve regurgitation. No evidence of mitral stenosis.  4. The aortic valve is calcified. Aortic valve regurgitation is trivial. Aortic valve sclerosis/calcification is present, without any evidence of aortic stenosis.  5. The inferior vena cava is normal in size with greater than 50% respiratory variability, suggesting right atrial pressure of 3 mmHg. FINDINGS  Left Ventricle: Left ventricular ejection fraction, by estimation, is 65 to 70%. The left ventricle has normal function. The left ventricle has no regional wall motion abnormalities. Strain imaging was not performed. The left ventricular internal cavity  size was normal in size. There is  no left ventricular hypertrophy. Left ventricular diastolic parameters were normal. Right Ventricle: The right ventricular size is normal. No increase in right ventricular wall thickness. Right ventricular systolic function is normal. There is normal pulmonary artery systolic pressure. The tricuspid regurgitant velocity is 2.28 m/s, and  with an assumed right atrial pressure of 3 mmHg, the estimated right ventricular systolic pressure is 23.8 mmHg. Left Atrium: Left atrial size was normal in size. Right Atrium: Right atrial size was normal in size. Pericardium: There is  no evidence of pericardial effusion. Mitral Valve: The mitral valve is grossly normal. No evidence of mitral valve regurgitation. No evidence of mitral valve stenosis. Tricuspid Valve: The tricuspid valve is grossly normal. Tricuspid valve regurgitation is trivial. No evidence of tricuspid stenosis. Aortic Valve: The aortic valve is calcified. Aortic valve regurgitation is trivial. Aortic regurgitation PHT measures 299 msec. Aortic valve sclerosis/calcification is present, without any evidence of aortic stenosis. Aortic valve mean gradient measures 9.0 mmHg. Aortic valve peak gradient measures 11.1 mmHg. Aortic valve area, by VTI measures 1.23 cm. Pulmonic Valve: The pulmonic valve was grossly normal. Pulmonic valve regurgitation is trivial. No evidence of pulmonic stenosis. Aorta: The aortic root and ascending aorta are structurally normal, with no evidence of dilitation. Venous: The inferior vena cava is normal in size with greater than 50% respiratory variability, suggesting right atrial pressure of 3 mmHg. IAS/Shunts: The atrial septum is grossly normal. Additional Comments: 3D imaging was not performed. Mild ascites is present.  LEFT VENTRICLE PLAX 2D LVIDd:         4.00 cm   Diastology LVIDs:         2.60 cm   LV e' medial:    9.25 cm/s LV PW:         0.90 cm   LV E/e' medial:  6.4 LV IVS:        1.00 cm   LV e' lateral:   12.20 cm/s LVOT  diam:     1.80 cm   LV E/e' lateral: 4.9 LV SV:         48 LV SV Index:   31 LVOT Area:     2.54 cm  RIGHT VENTRICLE             IVC RV S prime:     11.00 cm/s  IVC diam: 1.20 cm TAPSE (M-mode): 1.8 cm LEFT ATRIUM             Index        RIGHT ATRIUM           Index LA diam:        3.70 cm 2.36 cm/m   RA Area:     12.50 cm LA Vol (A2C):   29.1 ml 18.55 ml/m  RA Volume:   29.10 ml  18.55 ml/m LA Vol (A4C):   40.0 ml 25.50 ml/m LA Biplane Vol: 34.3 ml 21.87 ml/m  AORTIC VALVE                     PULMONIC VALVE AV Area (Vmax):    1.61 cm      PR End Diast Vel: 2.12 msec AV Area (Vmean):   1.25 cm AV Area (VTI):     1.23 cm AV Vmax:           166.67 cm/s AV Vmean:          138.000 cm/s AV VTI:            0.392 m AV Peak Grad:      11.1 mmHg AV Mean Grad:      9.0 mmHg LVOT Vmax:         105.50 cm/s LVOT Vmean:        67.950 cm/s LVOT VTI:          0.190 m LVOT/AV VTI ratio: 0.48 AI PHT:            299 msec  AORTA Ao Root diam: 3.10 cm Ao Asc diam:  2.80 cm MITRAL VALVE               TRICUSPID VALVE MV Area (PHT): 3.27 cm    TR Peak grad:   20.8 mmHg MV Decel Time: 232 msec    TR Vmax:        228.00 cm/s MV E velocity: 59.60 cm/s MV A velocity: 76.60 cm/s  SHUNTS MV E/A ratio:  0.78        Systemic VTI:  0.19 m                            Systemic Diam: 1.80 cm Lennie Odor MD Electronically signed by Lennie Odor MD Signature Date/Time: 02/13/2024/8:53:15 PM    Final    VAS [place] LOWER EXTREMITY VENOUS (DVT) Result Date: 02/12/2024  Lower Venous DVT Study Patient Name:  Tracy Preston  Date of Exam:   02/12/2024 Medical Rec #: [id number]           Accession #:    [id number] Date of Birth: May 24, 1935           Patient Gender: F Patient Age:   58 years Exam Location:  [facility] Procedure:      VAS [place] LOWER EXTREMITY VENOUS (DVT) Referring Phys: Stephania Fragmin --------------------------------------------------------------------------------  Indications: Pulmonary embolism.  Risk Factors: Newly diagnosed  peritoneal carcinomatosis. Comparison Study: No previous exams Performing Technologist: Jody Hill RVT, RDMS  Examination Guidelines: A complete evaluation includes B-mode imaging, spectral Doppler, color Doppler, and power Doppler as needed of all accessible portions of each vessel. Bilateral testing is considered an integral part of a complete examination. Limited examinations for reoccurring indications may be performed as noted. The reflux portion of the exam is performed with the patient in reverse Trendelenburg.  +---------+---------------+---------+-----------+----------+--------------+ RIGHT    CompressibilityPhasicitySpontaneityPropertiesThrombus Aging +---------+---------------+---------+-----------+----------+--------------+ CFV      Full           Yes      No                                  +---------+---------------+---------+-----------+----------+--------------+ SFJ      Full                                                        +---------+---------------+---------+-----------+----------+--------------+ FV Prox  Full           Yes      Yes                                 +---------+---------------+---------+-----------+----------+--------------+ FV Mid   Full           Yes      Yes                                 +---------+---------------+---------+-----------+----------+--------------+ FV DistalFull           Yes      Yes                                 +---------+---------------+---------+-----------+----------+--------------+  PFV      Full                                                        +---------+---------------+---------+-----------+----------+--------------+ POP      Full           Yes      Yes                                 +---------+---------------+---------+-----------+----------+--------------+ PTV      Full                                                         +---------+---------------+---------+-----------+----------+--------------+ PERO     Full                                                        +---------+---------------+---------+-----------+----------+--------------+   +---------+---------------+---------+-----------+----------+--------------+ LEFT     CompressibilityPhasicitySpontaneityPropertiesThrombus Aging +---------+---------------+---------+-----------+----------+--------------+ CFV      Full           Yes      Yes                                 +---------+---------------+---------+-----------+----------+--------------+ SFJ      Full                                                        +---------+---------------+---------+-----------+----------+--------------+ FV Prox  Full           Yes      Yes                                 +---------+---------------+---------+-----------+----------+--------------+ FV Mid   Full           Yes      Yes                                 +---------+---------------+---------+-----------+----------+--------------+ FV DistalFull           Yes      Yes                                 +---------+---------------+---------+-----------+----------+--------------+ PFV      Full                                                        +---------+---------------+---------+-----------+----------+--------------+  POP      Full           Yes      Yes                                 +---------+---------------+---------+-----------+----------+--------------+ PTV      None           No       No                   Acute          +---------+---------------+---------+-----------+----------+--------------+ PERO     None           No       No                   Acute          +---------+---------------+---------+-----------+----------+--------------+     Summary: BILATERAL: -No evidence of popliteal cyst, bilaterally. RIGHT: - There is no evidence of deep vein thrombosis  in the lower extremity.  LEFT: - Findings consistent with acute deep vein thrombosis involving the left peroneal veins, and left posterior tibial veins.   *See table(s) above for measurements and observations. Electronically signed by Carolynn Sayers on 02/12/2024 at 8:18:12 PM.    Final    CT CHEST W CONTRAST Addendum Date: 02/11/2024 ADDENDUM REPORT: 02/11/2024 15:54 ADDENDUM: Critical Value/emergent results were called by telephone at the time of interpretation on 02/11/2024 at 3:54 pm to provider Eugene Garnet, MD, who verbally acknowledged these results. Electronically Signed   By: Delbert Phenix M.D.   On: 02/11/2024 15:54   Result Date: 02/11/2024 CLINICAL DATA:  Peritoneal carcinomatosis. Chest staging. * Tracking Code: BO * EXAM: CT CHEST WITH CONTRAST TECHNIQUE: Multidetector CT imaging of the chest was performed during intravenous contrast administration. RADIATION DOSE REDUCTION: This exam was performed according to the departmental dose-optimization program which includes automated exposure control, adjustment of the mA and/or kV according to patient size and/or use of iterative reconstruction technique. CONTRAST:  75mL OMNIPAQUE IOHEXOL 300 MG/ML  SOLN COMPARISON:  02/04/2024 CT abdomen/pelvis. 02/08/2024 chest radiograph. FINDINGS: Cardiovascular: Normal heart size. No significant pericardial effusion/thickening. Left anterior descending coronary atherosclerosis. Right PICC terminates in the middle third of the SVC. Atherosclerotic nonaneurysmal thoracic aorta. Incidental acute pulmonary embolism involving the distal right pulmonary artery and lobar and segmental right upper lobe and right middle lobe pulmonary artery branches. No saddle embolus. Normal caliber main pulmonary artery. RV/LV ratio 1.0. Mediastinum/Nodes: No significant thyroid nodules. Fluid level in the mid to lower thoracic esophagus with enteric tube terminating in the proximal stomach. No right axillary adenopathy. Mild left  axillary adenopathy up to 1.1 cm (series 2/image 61). No pathologically enlarged mediastinal or hilar nodes. Lungs/Pleura: No pneumothorax. Small layering right pleural effusion. No left pleural effusion. Calcified 1 cm posterior right lower lobe granuloma. Mild dependent bilateral lower lobe atelectasis. No acute consolidative airspace disease, lung masses or significant pulmonary nodules. Upper abdomen: Moderate upper abdominal ascites with nodular soft tissue caking in the visualized anterior left upper peritoneum, as seen on recent CT abdomen study. Several hypodense liver lesions scattered throughout the visualized liver, unchanged, largest 1.6 cm in the inferior liver on series 2/image 155. Musculoskeletal: No aggressive appearing focal osseous lesions. Mild thoracic spondylosis. IMPRESSION: 1. Incidental acute pulmonary embolism involving the distal right pulmonary artery and lobar and segmental right upper lobe and right middle lobe  pulmonary artery branches. No saddle embolus. Normal caliber main pulmonary artery. Borderline elevated RV/LV Ratio = 1.0. 2. Small layering right pleural effusion. Mild dependent bilateral lower lobe atelectasis. 3. Mild left axillary adenopathy, cannot exclude metastatic disease. No other evidence of metastatic disease in the chest. 4. One vessel coronary atherosclerosis. 5. Moderate upper abdominal ascites with nodular soft tissue caking in the visualized anterior left upper peritoneum, as seen on recent CT abdomen study, compatible with peritoneal carcinomatosis. Several hypodense liver lesions scattered throughout the visualized liver, unchanged, suspicious for liver metastases. Electronically Signed: By: Delbert Phenix M.D. On: 02/11/2024 15:28   MR PELVIS W WO CONTRAST Result Date: 02/11/2024 CLINICAL DATA:  "Occult malignancy". Peritoneal carcinomatosis on abdominopelvic CT with bowel obstruction. EXAM: MRI PELVIS WITHOUT AND WITH CONTRAST TECHNIQUE: Multiplanar  multisequence MR imaging of the pelvis was performed both before and after administration of intravenous contrast. CONTRAST:  5mL GADAVIST GADOBUTROL 1 MMOL/ML IV SOLN COMPARISON:  CT of 02/04/2024 FINDINGS: Urinary Tract: Primarily decompressed urinary bladder. Left renal sinus cysts without hydronephrosis. Bowel: Fluid-filled small bowel loops within the upper and mid pelvis measure up to 2.0 cm on 19/17. Improved from 3.4 cm on the prior exam. Again identified is an intussusception of the sigmoid into the rectum. There is mild hyperenhancement within the lead point including on 41/17, without restricted diffusion in this area. Vascular/Lymphatic: Ileocolic mesenteric adenopathy including at 2.2 x 1.8 cm on 08/17. No pelvic sidewall adenopathy. Reproductive: The endometrium is thickened for age including at 1.0 cm on 18/4. This is followed to the level of the cervix and lower uterine segment, where soft tissue fullness is again identified including on 18/4 and 21/3. Correlate restricted diffusion on 22/11. Example 3.7 x 3.7 cm on 31/17. There is amorphous soft tissue fullness within both adnexa, greater right than left. Example restricted diffusion on 17/11. The right adnexal soft tissue fullness measures 3.3 x 3.3 cm on 18/17. No separate ovarian tissue identified. Other: Peritoneal carcinomatosis, as evidenced by diffuse peritoneal enhancement and restricted diffusion (example 22/11). Small volume pelvic fluid. Musculoskeletal: No acute osseous abnormality. IMPRESSION: 1. Abnormal amorphous soft tissue throughout lower uterine segment and cervix could represent primary uterine/cervical carcinoma or metastatic disease. 2. Diffuse peritoneal carcinomatosis as better imaged on dedicated CT. Ileocolic mesenteric nodal metastasis. 3. Right greater than left adnexal soft tissue fullness likely represents ovarian metastasis. 4. Chronic or recurrent sigmoid/rectal intussusception since 02/04/2024. Vague hyperenhancement  in the region of the lead point, without dominant mass. Recommend physical exam correlation to exclude either primary rectosigmoid carcinoma or serosal sigmoid implant lead point. 5. Small volume pelvic fluid. 6. Improved small bowel dilatation since prior CT. Electronically Signed   By: Jeronimo Greaves M.D.   On: 02/11/2024 15:42   DG Abd 1 View Result Date: 02/10/2024 CLINICAL DATA:  [id number] Abdominal distention [id number] [phone] SBO (small bowel obstruction) (HCC) [id number] EXAM: ABDOMEN - 1 VIEW COMPARISON:  02/08/2024 FINDINGS: NG tube terminates within the proximal stomach. Worsening small bowel dilation in the mid abdomen measuring up to 5.3 cm (previously 3.3 cm. Paucity of bowel gas within the colon. No gross free intraperitoneal air. IMPRESSION: 1. Worsening small bowel obstruction. 2. NG tube terminates within the proximal stomach. Recommend advancement. Electronically Signed   By: Duanne Guess D.O.   On: 02/10/2024 09:35   DG Chest 1 View Result Date: 02/08/2024 CLINICAL DATA:  PICC line placement EXAM: CHEST  1 VIEW COMPARISON:  02/05/2024 FINDINGS: Right PICC line in place with the tip in  the SVC. NG tube is in the stomach. Heart and mediastinal contours within normal limits. No confluent opacities or effusions. No acute bony abnormality. IMPRESSION: Right PICC line tip in the SVC. No active cardiopulmonary disease. Electronically Signed   By: Charlett Nose M.D.   On: 02/08/2024 17:51   [place] EKG SITE RITE Result Date: 02/08/2024 If Site Rite image not attached, placement could not be confirmed due to current cardiac rhythm.  DG Abd Portable 1V Result Date: 02/08/2024 CLINICAL DATA:  Check gastric catheter placement EXAM: PORTABLE ABDOMEN - 1 VIEW COMPARISON:  02/04/2024 FINDINGS: Scattered large and small bowel gas is noted. Mild small bowel dilatation is noted significantly improved when compared with the prior exam. Gastric catheter is noted with the tip in the stomach. No free air is noted.  IMPRESSION: Persistent but significantly improved small bowel dilatation when compared with the prior exam. Electronically Signed   By: Alcide Clever M.D.   On: 02/08/2024 09:34   US Paracentesis Result Date: 02/05/2024 INDICATION: 89 year old with new abdominal distention, ascites. Request made for diagnostic and therapeutic paracentesis. EXAM: ULTRASOUND GUIDED DIAGNOSTIC  PARACENTESIS MEDICATIONS: 10 mL 1% lidocaine. COMPLICATIONS: None immediate. PROCEDURE: Informed written consent was obtained from the patient after a discussion of the risks, benefits and alternatives to treatment. A timeout was performed prior to the initiation of the procedure. Initial ultrasound scanning demonstrates a small amount of ascites within the left lateral abdomen interlooped with bowel. The left lateral abdomen was prepped and draped in the usual sterile fashion. 1% lidocaine was used for local anesthesia. Following this, a 19 gauge, 7-cm, Yueh catheter was introduced. An ultrasound image was saved for documentation purposes. The paracentesis was performed. The catheter was removed and a dressing was applied. The patient tolerated the procedure well without immediate post procedural complication. FINDINGS: A total of approximately 100 mL of yellow fluid was removed. Samples were sent to the laboratory as requested by the clinical team. IMPRESSION: Successful ultrasound-guided paracentesis yielding 100 mL of peritoneal fluid. Performed by: Loyce Dys PA-C Electronically Signed   By: Malachy Moan M.D.   On: 02/05/2024 14:31   US PELVIC COMPLETE WITH TRANSVAGINAL Result Date: 02/05/2024 CLINICAL DATA:  89 year old female with ascites, peritoneal carcinomatosis on CT Abdomen and Pelvis yesterday. EXAM: TRANSABDOMINAL AND TRANSVAGINAL ULTRASOUND OF PELVIS TECHNIQUE: Both transabdominal and transvaginal ultrasound examinations of the pelvis were performed. Transabdominal technique was performed for global imaging of the  pelvis including uterus, ovaries, adnexal regions, and pelvic cul-de-sac. It was necessary to proceed with endovaginal exam following the transabdominal exam to visualize the ovaries. COMPARISON:  CT Abdomen and Pelvis 02/04/2024. FINDINGS: Uterus Measurements: 6.9 x 3.9 x 3.9 cm = volume: 41 mL. No obvious myometrial mass. Endometrium Heterogeneous hypoechoic area at the fundus endometrium (image 49 of series 1). This area encompasses about 2 cm (image 55). See also cine series 3 images. Otherwise the echogenic endometrial stripe is up to 5 mm. Right ovary Measurements: 3.5 x 2.1 x 2.8 cm = volume: 11 mL. No ovarian mass identified. Left ovary Measurements: 2.3 x 1.5 x 1.7 cm = volume: 3 mL. No ovarian mass identified. Other findings Small volume ascites again visible in the pelvis. IMPRESSION: 1. No ovarian mass identified by ultrasound. 2. There is a 2 cm heterogeneous area at the uterine fundus which is favored to be endometrial related. Elsewhere the endometrial stripe is about 5 mm. An Endometrial Malignancy is not excluded. 3. Ascites, in conjunction with the extensive additional abdominal  and pelvic abnormalities demonstrated by CT yesterday. Electronically Signed   By: Odessa Fleming M.D.   On: 02/05/2024 12:17   DG Chest Portable 1 View Result Date: 02/05/2024 CLINICAL DATA:  NG tube replacement confirmation EXAM: PORTABLE CHEST - 1 VIEW COMPARISON:  02/04/2024 FINDINGS: Cardiomediastinal silhouette and pulmonary vasculature are within normal limits. Lungs are clear. Nasogastric tube extends to the left upper quadrant the side hole located at the level of the gastroesophageal junction. It is slightly retracted compared to prior exam. IMPRESSION: Nasogastric tube terminates in the left upper quadrant in the expected site of the stomach. The side hole of the NG tube is located at the level of the gastroesophageal junction. Electronically Signed   By: Acquanetta Belling M.D.   On: 02/05/2024 07:38   DG Chest Portable  1 View Result Date: 02/04/2024 CLINICAL DATA:  NG tube placement. Abdominal discomfort, heartburn, and nausea. EXAM: PORTABLE CHEST 1 VIEW COMPARISON:  None Available. FINDINGS: The heart size and mediastinal contours are within normal limits. There is atherosclerotic calcification of the aorta. No consolidation, effusion, or pneumothorax. An enteric tube terminates in the stomach. No acute osseous abnormality. IMPRESSION: 1. No active disease. 2. Enteric tube terminates in the stomach. Electronically Signed   By: Thornell Sartorius M.D.   On: 02/04/2024 21:20   CT ABDOMEN PELVIS W CONTRAST Result Date: 02/04/2024 CLINICAL DATA:  Abdominal pain, bloating, nausea and vomiting for 2 weeks. * Tracking Code: BO * EXAM: CT ABDOMEN AND PELVIS WITH CONTRAST TECHNIQUE: Multidetector CT imaging of the abdomen and pelvis was performed using the standard protocol following bolus administration of intravenous contrast. RADIATION DOSE REDUCTION: This exam was performed according to the departmental dose-optimization program which includes automated exposure control, adjustment of the mA and/or kV according to patient size and/or use of iterative reconstruction technique. CONTRAST:  OMNIPAQUE IOHEXOL 300 MG/ML  SOLN COMPARISON:  None Available. FINDINGS: Lower Chest: No acute findings. Hepatobiliary: A few tiny hepatic cysts are seen, however, there are multiple hypovascular masses involving the right and left hepatic lobes, largest in the central right hepatic lobe measuring 1.9 x 1.5 cm on image 26/2. These are consistent with liver metastases. Gallbladder is unremarkable. No evidence of biliary ductal dilatation. Pancreas:  No mass or inflammatory changes. Spleen: Within normal limits in size and appearance. Adrenals/Urinary Tract: No suspicious masses identified. No evidence of ureteral calculi or hydronephrosis. Stomach/Bowel: Distal small bowel obstruction, with transition point in the right lower quadrant, likely due to  peritoneal carcinoma. Rectal intussusception incidentally noted. Vascular/Lymphatic: Mild retroperitoneal lymphadenopathy in the left para-aortic and aortocaval spaces. No acute vascular findings. Reproductive: A 1.6 cm peripherally calcified fibroid is seen in the uterine fundus. Poorly defined masslike soft tissue prominence is seen in the region of the cervix and lower uterine segment measuring 3.6 x 3.3 cm on image 63/2, possibly representing a primary cervical or endometrial carcinoma. Poorly defined soft tissue density is seen in both adnexal regions and along the uterine fundus, consistent with peritoneal carcinomatosis. Other: Mild ascites is seen. Peritoneal and omental soft tissue nodules and masses are seen throughout the abdomen and pelvis, consistent with peritoneal carcinomatosis. Largest conglomerate mass is seen in the right abdomen measuring approximately 6.7 x 3.4 cm on image 37/2. Musculoskeletal:  No suspicious bone lesions identified. IMPRESSION: Diffuse peritoneal carcinomatosis and mild ascites. Distal small bowel obstruction, with transition point in the right lower quadrant, likely due to peritoneal carcinomatosis. Masslike soft tissue prominence in the region of the lower uterine  segment and cervix, possibly representing primary cervical or endometrial carcinoma. Liver metastases. Mild retroperitoneal lymphadenopathy, consistent with metastatic disease. Rectal intussusception incidentally noted. No definite lead mass identified. Electronically Signed   By: Danae Orleans M.D.   On: 02/04/2024 11:34  Ms. Vandeven was interviewed and examined.  Her son-in-law was at the bedside when I saw her this morning.  I reviewed the CT findings with her.  I reviewed the images.  The overall CT pattern is consistent with stable disease.  There is been partial improvement in the small bowel obstruction. She had an episode of vomiting last night.  She continues to have bowel movements and flatus.  She has a  poor appetite.  The abdomen is less distended on exam today.  We discussed disposition plans.  We discussed placement of a palliative venting gastrostomy tube if she continues to have obstructive symptoms.  We discussed the indication for proceeding with cycle 2 chemotherapy versus comfort care.  It is early to expect a radiologic response from Taxol/carboplatin.  We will follow-up on the CA125.  I favor giving her a few more days to see if the obstructive symptoms improved.  We can then consider placing a venting gastrostomy, although this may be difficult with the carcinomatosis and ascites.  I discussed home care and placement options with Dr. Venetia Maxon.  She may be a hospice/[facility] candidate if she does not receive further chemotherapy.  Recommendations: Continue TPN Liquid diet as tolerated Change anticoagulation to apixaban or Lovenox if a G-tube procedure is not planned Consider discontinuing Decadron

## 2024-02-23 NOTE — Progress Notes (Signed)
   02/23/24 1251  TOC Brief Assessment  Insurance and Status Reviewed  Patient has primary care physician Yes  Home environment has been reviewed return home  Prior level of function: Independent  Prior/Current Home Services No current home services  Social Drivers of Health Review SDOH reviewed no interventions necessary  Readmission risk has been reviewed Yes  Transition of care needs transition of care needs identified, TOC will continue to follow

## 2024-02-23 NOTE — Progress Notes (Signed)
 PHARMACY - ANTICOAGULATION CONSULT NOTE  Pharmacy Consult for heparin  Indication: acute pulmonary embolus and DVT  Allergies  Allergen Reactions   Calcium-Containing Compounds Other (See Comments)    Upsets the stomach, so it is not taken very often    Patient Measurements: Height: 5\' 4"  (162.6 cm) Weight: 57.2 kg (126 lb 1.7 oz) IBW/kg (Calculated) : 54.7 Heparin Dosing Weight: use total body weight   Vital Signs: Temp: 97.4 F (36.3 C) (02/26 0453) Temp Source: Oral (02/26 0453) BP: 110/48 (02/26 0453) Pulse Rate: 74 (02/26 0453)  Labs: Recent Labs    02/21/24 0323 02/22/24 0545 02/23/24 0636  HGB 9.5* 8.8* 9.0*  HCT 29.6* 28.0* 27.6*  PLT 247 222 223  HEPARINUNFRC 0.64 0.55 0.52  CREATININE 0.83 0.93 0.81    Estimated Creatinine Clearance: 43.1 mL/min (by C-G formula based on SCr of 0.81 mg/dL).  Assessment: 89 y/o F with metastatic ovarian cancer admitted with SBO secondary to peritoneal carcinomatosis. Chest CT on 2/14 showed incidental acute PE in the "the distal right pulmonary artery and lobar and segmental right upper lobe and right middle lobe pulmonary artery branches" with borderline elevated RV/LV ratio.  LE doppler on 02/12/24 positive for acute DVT in the left peroneal veins and left posterior tibial veins. Pharmacy consulted for IV heparin dosing for VTE treatment.  Significant events:  - 2/15: received carboplatin/paclitaxel   Today, 02/23/2024: Heparin level remains therapeutic at 0.52 with heparin infusing at 900 units/hr UFH infusing via PICC, labs being obtained via peripheral stick CBC: Hgb low but stable, Plt WNL Note chemo received 2/15 No bleeding or complications of therapy reported  Goal of Therapy:  Heparin level 0.3-0.7 units/ml Monitor platelets by anticoagulation protocol: Yes   Plan:  Continue heparin infusion at 900 units/hr Monitor daily heparin level, CBC, signs/symptoms of bleeding F/U transition to direct oral  anticoagulant (DOAC) when taking orals    Thank you for allowing pharmacy to be a part of this patient's care.  Selinda Eon, PharmD, BCPS Clinical Pharmacist [facility] 02/23/2024 9:27 AM

## 2024-02-23 NOTE — Progress Notes (Signed)
 Subjective/Chief Complaint: No complaints other than some bloating   Objective: Vital signs in last 24 hours: Temp:  [97.4 F (36.3 C)-98.2 F (36.8 C)] 97.4 F (36.3 C) (02/26 0453) Pulse Rate:  [74-79] 74 (02/26 0453) Resp:  [16-17] 17 (02/26 0453) BP: (110-152)/(48-69) 110/48 (02/26 0453) SpO2:  [95 %] 95 % (02/26 0453) Last BM Date : 02/21/24  Intake/Output from previous day: 02/25 0701 - 02/26 0700 In: 1624.1 [I.V.:1624.1] Out: -  Intake/Output this shift: No intake/output data recorded.  General appearance: alert and cooperative Resp: clear to auscultation bilaterally Cardio: regular rate and rhythm GI: soft, distended  Lab Results:  Recent Labs    02/22/24 0545 02/23/24 0636  WBC 12.7* 12.8*  HGB 8.8* 9.0*  HCT 28.0* 27.6*  PLT 222 223   BMET Recent Labs    02/22/24 0545 02/23/24 0636  NA 137 133*  K 4.2 4.5  CL 104 101  CO2 23 23  GLUCOSE 107* 101*  BUN 43* 34*  CREATININE 0.93 0.81  CALCIUM 8.5* 8.3*   PT/INR No results for input(s): "LABPROT", "INR" in the last 72 hours. ABG No results for input(s): "PHART", "HCO3" in the last 72 hours.  Invalid input(s): "PCO2", "PO2"  Studies/Results: CT ABDOMEN PELVIS W CONTRAST Result Date: 02/22/2024 CLINICAL DATA:  Ovarian cancer, bowel obstruction, restaging. * Tracking Code: BO * EXAM: CT ABDOMEN AND PELVIS WITH CONTRAST TECHNIQUE: Multidetector CT imaging of the abdomen and pelvis was performed using the standard protocol following bolus administration of intravenous contrast. RADIATION DOSE REDUCTION: This exam was performed according to the departmental dose-optimization program which includes automated exposure control, adjustment of the mA and/or kV according to patient size and/or use of iterative reconstruction technique. CONTRAST:  OMNIPAQUE IOHEXOL 300 MG/ML  SOLN COMPARISON:  Abdominopelvic CT 02/04/2024, pelvic MRI 02/11/2024 and chest CT 02/11/2024. FINDINGS: Lower chest: Small  right-greater-than-left pleural effusions with dependent atelectasis at both lung bases, new from 02/04/2024 and increased from 02/11/2024. Contrast filled and mildly distended distal esophagus. Hepatobiliary: Multiple hypoenhancing hepatic metastases are again noted. The largest lesion superior to the gallbladder is less well-defined on the current study and may be slightly smaller, measuring 1.4 cm on image 26/2 (previously 1.9 x 1.5 cm). Other lesions are grossly stable, including a lesion more superiorly in the central right lobe measuring 1.4 cm on image 18/2. Small hepatic cysts are also noted. No evidence of gallstones, gallbladder wall thickening or biliary dilatation. Pancreas: Unremarkable. No pancreatic ductal dilatation or surrounding inflammatory changes. Spleen: Stable 8 mm low-density splenic lesion on image 24/2, likely a cyst. The spleen is normal in size without other focal abnormality. Adrenals/Urinary Tract: Both adrenal glands appear normal. No evidence of urinary tract calculus, suspicious renal lesion or hydronephrosis. The right renal pelvis is mildly dilated without secondary signs of ureteral obstruction. There are renal sinus cysts bilaterally. The bladder appears unremarkable for its degree of distention. Stomach/Bowel: Enteric contrast has passed into the mid colon. The stomach remains mildly distended. Small bowel distension has mildly improved. The colon remains decompressed. There is a persistent distal colonic intussusception with the sigmoid colon invaginated into the rectum. Appearance is similar to the previous CT from 02/04/2024. No evidence of bowel perforation. Vascular/Lymphatic: Small retroperitoneal and mesenteric lymph nodes appear unchanged. Mild aortic and branch vessel atherosclerosis without evidence of aneurysm or large vessel occlusion. The portal, superior mesenteric and splenic veins remain patent. Reproductive: Persistent ill-defined mass involving the cervix,  measuring approximately 3.4 x 4.0 cm on image  62/2. There is mild distension of the endometrial cavity. A small peripherally calcified uterine fundal fibroid is noted. Persistent ill-defined adnexal masses bilaterally, measuring approximately 4.0 x 2.6 cm on the right (image 54/2) and 2.9 x 1.9 cm on the left (image 57/2). Other: Small to moderate volume of ascites, similar to previous CT. Diffuse omental nodularity may be minimally improved in the interval with a dominant component in the right mid abdomen measuring 3.8 x 2.3 cm on image 39/2. No extravasated enteric contrast or pneumoperitoneum. Musculoskeletal: No acute or significant osseous findings. Mild lumbar spondylosis. IMPRESSION: 1. Persistent distal colonic intussusception with the sigmoid colon invaginated into the rectum. Appearance is similar to the previous CT from 02/04/2024 and may relate to underlying peritoneal tumor. 2. Mild improvement in distal partial small bowel obstruction with enteric contrast passing into the mid colon. The colon remains decompressed. 3. Persistent ill-defined cervical and bilateral adnexal masses consistent with known ovarian cancer. Associated peritoneal carcinomatosis may be minimally improved. No definite progressive disease. 4. Persistent hepatic metastases, one of which may be slightly smaller in the interval. 5. Small right-greater-than-left pleural effusions with dependent atelectasis at both lung bases, new from 02/04/2024 and increased from 02/11/2024. 6.  Aortic Atherosclerosis (ICD10-I70.0). Electronically Signed   By: Carey Bullocks M.D.   On: 02/22/2024 16:08    Anti-infectives: Anti-infectives (From admission, onward)    None       Assessment/Plan: s/p * No surgery found * Carcinomatosis. Poor prognosis. Oncology following Pt would be agreeable to IR gastrostomy but does not want surgery We will be available to help in any way if possible  LOS: 19 days    Chevis Pretty III 02/23/2024

## 2024-02-23 NOTE — Progress Notes (Signed)
 PHARMACY - TOTAL PARENTERAL NUTRITION CONSULT NOTE   Indication: SBO  Assessment: 76 yoF admitted 2/7 after 2 weeks of progressively worsening reflux and abdominal distention with n/v. 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 Plan for bowel rest +/- venting G-tube while patient undergoes chemotherapy to hopefully relieve obstruction. Pharmacy consulted for TPN management.  Glucose / Insulin: no hx DM; CBG goal 100-180 -2 units of insulin given in past 24 hours  Electrolytes:  Na slightly low at 133 and previously on low end of normal limits; others WNL including CoCa Renal: SCr stable WNL; BUN elevated Hepatic: LFTs improving, Alk Phos elevated - Tbili remains stable WNL - TG WNL 2/24 I/O: Continues to have improving bowel function as noted 2/20 per GynOnc (flatus, small BMs) - NG removed 2/22 - Stool-none charted - UOP: 2x unmeasured - no mIVF GI Imaging: - 2/7 CT a/p: distal SBO d/t diffuse peritoneal carcinomatosis, with transition point in RLQ, hepatic and retroperitoneal metastases; incidental finding of rectal intussusception - 2/11 AXR: persistent but significantly improved SB dilation when compared with prior - 2/13 AXR: Worsening small bowel obstruction.  - 2/14 MRI pelvis: Abnormal amorphous soft tissue throughout lower uterine segment and cervix with ileocolic and likely ovarian mets. - 2/25 CT abdomen pelvis: Persistent distal colonic intussusception with the sigmoid colon invaginated into the rectum. Appearance is similar to the previous CT from 02/04/2024 and may relate to underlying peritoneal tumor. Mild improvement in distal partial small bowel obstruction with enteric contrast passing into the mid colon. GI Surgeries / Procedures: NA  Central access: PICC TPN start date: 2/11  Nutritional Goals: Goal TPN rate is 70 mL/hr (provides 89 g of protein and 1717 kcals per day)  RD Assessment: Estimated Needs Total Energy Estimated Needs: 1625-1900 kcal Total Protein Estimated  Needs: 80-95g Total Fluid Estimated Needs: 1.9L/day  Current Nutrition:  TPN, Diet advanced to clear liquid on 2/22  Plan:  Continue custom TPN @ goal rate of 70 mL/hr Electrolytes in TPN:  Na - 100 mEq/L - increased K - 40 mEq/L Ca - 3 mEq/L Mg - 5 mEq/L Phos - 15 mmol/L Cl:Ac ratio - 1:2 Add standard MVI and trace elements to TPN Continue to hold chromium for national backorder mIVF per MD sSSI q8h Monitor TPN labs on Mon/Thurs    Thank you for allowing pharmacy to be a part of this patient's care.  Selinda Eon, PharmD, BCPS Clinical Pharmacist [facility] 02/23/2024 9:31 AM

## 2024-02-24 DIAGNOSIS — Z515 Encounter for palliative care: Secondary | ICD-10-CM | POA: Diagnosis not present

## 2024-02-24 DIAGNOSIS — C786 Secondary malignant neoplasm of retroperitoneum and peritoneum: Secondary | ICD-10-CM | POA: Diagnosis not present

## 2024-02-24 DIAGNOSIS — R112 Nausea with vomiting, unspecified: Secondary | ICD-10-CM | POA: Diagnosis not present

## 2024-02-24 DIAGNOSIS — Z79899 Other long term (current) drug therapy: Secondary | ICD-10-CM

## 2024-02-24 DIAGNOSIS — C787 Secondary malignant neoplasm of liver and intrahepatic bile duct: Secondary | ICD-10-CM | POA: Diagnosis not present

## 2024-02-24 DIAGNOSIS — C801 Malignant (primary) neoplasm, unspecified: Secondary | ICD-10-CM | POA: Diagnosis not present

## 2024-02-24 LAB — CBC WITH DIFFERENTIAL/PLATELET
Abs Immature Granulocytes: 0.76 10*3/uL — ABNORMAL HIGH (ref 0.00–0.07)
Basophils Absolute: 0.1 10*3/uL (ref 0.0–0.1)
Basophils Relative: 1 %
Eosinophils Absolute: 0 10*3/uL (ref 0.0–0.5)
Eosinophils Relative: 0 %
HCT: 26.7 % — ABNORMAL LOW (ref 36.0–46.0)
Hemoglobin: 8.7 g/dL — ABNORMAL LOW (ref 12.0–15.0)
Immature Granulocytes: 7 %
Lymphocytes Relative: 15 %
Lymphs Abs: 1.7 10*3/uL (ref 0.7–4.0)
MCH: 31.1 pg (ref 26.0–34.0)
MCHC: 32.6 g/dL (ref 30.0–36.0)
MCV: 95.4 fL (ref 80.0–100.0)
Monocytes Absolute: 0.7 10*3/uL (ref 0.1–1.0)
Monocytes Relative: 7 %
Neutro Abs: 7.5 10*3/uL (ref 1.7–7.7)
Neutrophils Relative %: 70 %
Platelets: 204 10*3/uL (ref 150–400)
RBC: 2.8 MIL/uL — ABNORMAL LOW (ref 3.87–5.11)
RDW: 13.6 % (ref 11.5–15.5)
Smear Review: ADEQUATE
WBC: 10.8 10*3/uL — ABNORMAL HIGH (ref 4.0–10.5)
nRBC: 0.3 % — ABNORMAL HIGH (ref 0.0–0.2)

## 2024-02-24 LAB — COMPREHENSIVE METABOLIC PANEL
ALT: 37 U/L (ref 0–44)
AST: 21 U/L (ref 15–41)
Albumin: 2.4 g/dL — ABNORMAL LOW (ref 3.5–5.0)
Alkaline Phosphatase: 176 U/L — ABNORMAL HIGH (ref 38–126)
Anion gap: 8 (ref 5–15)
BUN: 31 mg/dL — ABNORMAL HIGH (ref 8–23)
CO2: 25 mmol/L (ref 22–32)
Calcium: 7.9 mg/dL — ABNORMAL LOW (ref 8.9–10.3)
Chloride: 96 mmol/L — ABNORMAL LOW (ref 98–111)
Creatinine, Ser: 0.94 mg/dL (ref 0.44–1.00)
GFR, Estimated: 59 mL/min — ABNORMAL LOW (ref >60)
Glucose, Bld: 128 mg/dL — ABNORMAL HIGH (ref 70–99)
Potassium: 4.8 mmol/L (ref 3.5–5.1)
Sodium: 129 mmol/L — ABNORMAL LOW (ref 135–145)
Total Bilirubin: 0.5 mg/dL (ref 0.0–1.2)
Total Protein: 5.5 g/dL — ABNORMAL LOW (ref 6.5–8.1)

## 2024-02-24 LAB — PHOSPHORUS: Phosphorus: 3.6 mg/dL (ref 2.5–4.6)

## 2024-02-24 LAB — GLUCOSE, CAPILLARY
Glucose-Capillary: 121 mg/dL — ABNORMAL HIGH (ref 70–99)
Glucose-Capillary: 129 mg/dL — ABNORMAL HIGH (ref 70–99)
Glucose-Capillary: 169 mg/dL — ABNORMAL HIGH (ref 70–99)

## 2024-02-24 LAB — MAGNESIUM: Magnesium: 2.1 mg/dL (ref 1.7–2.4)

## 2024-02-24 LAB — HEPARIN LEVEL (UNFRACTIONATED): Heparin Unfractionated: 0.46 [IU]/mL (ref 0.30–0.70)

## 2024-02-24 MED ORDER — TRAVASOL 10 % IV SOLN
INTRAVENOUS | Status: AC
  Filled 2024-02-24: qty 890.4

## 2024-02-24 NOTE — Progress Notes (Signed)
 PHARMACY - ANTICOAGULATION CONSULT NOTE  Pharmacy Consult for heparin  Indication: acute pulmonary embolus and DVT  Allergies  Allergen Reactions   Calcium-Containing Compounds Other (See Comments)    Upsets the stomach, so it is not taken very often    Patient Measurements: Height: 5\' 4"  (162.6 cm) Weight: 57.2 kg (126 lb 1.7 oz) IBW/kg (Calculated) : 54.7 Heparin Dosing Weight: use total body weight   Vital Signs: Temp: 98.2 F (36.8 C) (02/27 0414) Temp Source: Oral (02/27 0414) BP: 108/55 (02/27 0414) Pulse Rate: 70 (02/27 0414)  Labs: Recent Labs    02/22/24 0545 02/23/24 0636 02/24/24 0601  HGB 8.8* 9.0* 8.7*  HCT 28.0* 27.6* 26.7*  PLT 222 223 204  HEPARINUNFRC 0.55 0.52 0.46  CREATININE 0.93 0.81  --     Estimated Creatinine Clearance: 43.1 mL/min (by C-G formula based on SCr of 0.81 mg/dL).  Assessment: 89 y/o F with metastatic ovarian cancer admitted with SBO secondary to peritoneal carcinomatosis. Chest CT on 2/14 showed incidental acute PE in the "the distal right pulmonary artery and lobar and segmental right upper lobe and right middle lobe pulmonary artery branches" with borderline elevated RV/LV ratio.  LE doppler on 02/12/24 positive for acute DVT in the left peroneal veins and left posterior tibial veins. Pharmacy consulted for IV heparin dosing for VTE treatment.  Significant events:  - 2/15: received carboplatin/paclitaxel   Today, 02/24/2024: Heparin level remains therapeutic at 0.46 with heparin infusing at 900 units/hr UFH infusing via PICC, labs being obtained via peripheral stick CBC: Hgb low but stable, Plt WNL Note chemo received 2/15 No bleeding or complications of therapy reported  Goal of Therapy:  Heparin level 0.3-0.7 units/ml Monitor platelets by anticoagulation protocol: Yes   Plan:  Continue heparin infusion at 900 units/hr Monitor daily heparin level, CBC, signs/symptoms of bleeding F/U transition to direct oral  anticoagulant (DOAC) when tolerating orals    Thank you for allowing pharmacy to be a part of this patient's care.  Cherylin Mylar, PharmD Clinical Pharmacist  2/27/20257:11 AM

## 2024-02-24 NOTE — Progress Notes (Signed)
 PHARMACY - TOTAL PARENTERAL NUTRITION CONSULT NOTE   Indication: SBO  Assessment: 60 yoF admitted 2/7 after 2 weeks of progressively worsening reflux and abdominal distention with n/v. 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 Plan for bowel rest +/- venting G-tube while patient undergoes chemotherapy to hopefully relieve obstruction. Pharmacy consulted for TPN management.  Glucose / Insulin: no hx DM; CBG goal 100-180 -CBGs 114-218 in last 24hrs (only one CBG >180) -4 units of insulin given in last 24 hours  Electrolytes:  Na low at 129 and previously on low end of normal limits; K 4.8 at higher end of normal range; CoCa 9.5 (WNL) Renal: SCr stable WNL; BUN elevated Hepatic: LFTs improving and now WNL, Alk Phos elevated but improving - Tbili remains stable WNL - TG WNL 2/24 I/O: Continues to have improving bowel function as noted 2/20 per GynOnc (flatus, small BMs) - NG removed 2/22 - Stool: 1x - UOP: 3x unmeasured - no mIVF GI Imaging: - 2/7 CT a/p: distal SBO d/t diffuse peritoneal carcinomatosis, with transition point in RLQ, hepatic and retroperitoneal metastases; incidental finding of rectal intussusception - 2/11 AXR: persistent but significantly improved SB dilation when compared with prior - 2/13 AXR: Worsening small bowel obstruction.  - 2/14 MRI pelvis: Abnormal amorphous soft tissue throughout lower uterine segment and cervix with ileocolic and likely ovarian mets. - 2/25 CT abdomen pelvis: Persistent distal colonic intussusception with the sigmoid colon invaginated into the rectum. Appearance is similar to the previous CT from 02/04/2024 and may relate to underlying peritoneal tumor. Mild improvement in distal partial small bowel obstruction with enteric contrast passing into the mid colon. GI Surgeries / Procedures: NA  Central access: PICC TPN start date: 2/11  Nutritional Goals: Goal TPN rate is 70 mL/hr (provides 89 g of protein and 1717 kcals per day)  RD Assessment: Estimated  Needs Total Energy Estimated Needs: 1625-1900 kcal Total Protein Estimated Needs: 80-95g Total Fluid Estimated Needs: 1.9L/day  Current Nutrition:  TPN, Diet advanced to clear liquid on 2/22  Plan:  Continue custom TPN @ goal rate of 70 mL/hr Electrolytes in TPN:  Na - 120 mEq/L - increased K - 20 mEq/L - decreased Ca - 3 mEq/L Mg - 5 mEq/L Phos - 15 mmol/L Cl:Ac ratio - 2:1 Add standard MVI and trace elements to TPN Continue to hold chromium for national backorder mIVF per MD sSSI q8h Monitor TPN labs on Mon/Thurs, BMP tomorrow    Thank you for allowing pharmacy to be a part of this patient's care.  Cherylin Mylar, PharmD Clinical Pharmacist  2/27/20257:25 AM

## 2024-02-24 NOTE — Progress Notes (Signed)
 Daily Progress Note   Patient Name: Tracy Preston       Date: 02/24/2024 DOB: February 16, 1935  Age: 89 y.o. MRN#: [id number] Attending Physician: Burnadette Pop, MD Primary Care Physician: Marden Noble, MD (Inactive) Admit Date: 02/04/2024 Length of Stay: 20 days  Reason for Consultation/Follow-up: Establishing goals of care  Subjective:   Reviewed EMR prior to presenting to bedside.  Patient started on octreotide 100 mcg every 8 hours on 02/23/2024.  IR has been consulted to discuss details regarding venting G-tube placement with patient and family.  Presented to bedside to see patient.  Patient's grandson present at bedside visiting.  With permission, discussed symptom management at this time.  Patient feels her nausea is greatly improved after starting octreotide to the point she does not feel nauseous at this point.  Patient noted she would attempt to eat diet later on today if able to determine how well she tolerated it.   Patient's only other request was getting a hair shampoo so noted would inform RN of this.  All questions answered at that time.  Noted palliative medicine team continue to follow along with patient's journey.  Objective:   Vital Signs:  BP (!) 108/55 (BP Location: Left Arm)   Pulse 70   Temp 98.2 F (36.8 C) (Oral)   Resp 18   Ht 5\' 4"  (1.626 m)   Wt 57.2 kg   SpO2 96%   BMI 21.65 kg/m   Physical Exam:  General: NAD, alert, sitting in bedside chair Cardiovascular: RRR, no edema in LE b/l Respiratory: no increased work of breathing noted, not in respiratory distress Neuro: A&Ox4, following commands easily Psych: appropriately answers all questions  Imaging: I personally reviewed recent imaging.   Assessment & Plan:   Assessment: Patient is a 89 year old female with past medical history of hypertension, CKD, and family history of breast cancer who was admitted on 02/04/2024 for management of abdominal distention and vomiting for approximately 2 weeks.   Imaging concerning for new metastatic gynecological malignancy with small bowel obstruction.  Imaging also showed likely metastatic disease to liver and peritoneal carcinomatosis.  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  Gyn Onc and oncology consulted for recommendations.  Palliative medicine team consulted to assist with complex medical decision making.   Recommendations/Plan: # Complex medical decision making/goals of care: -Discussed care with patient as detailed above in HPI.  At this time continuing appropriate medical interventions.  Patient to obtain further information about possibility of venting G-tube before making decision regarding to proceed with this intervention.  Patient wants to continue open discussions to best support her quality of life moving forward as patient has noted multiple times that her quality of life is more important to her than her quantity of life.                 -  Code Status: Limited: Do not attempt resuscitation (DNR) -DNR-LIMITED -Do Not Intubate/DNI    # Symptom management Patient is receiving these palliative interventions for symptom management with an intent to improve quality of life.                 -Malignant small bowel obstruction secondary to peritoneal carcinomatosis                               -Receiving IV dexamethasone 8 mg daily   -Continue scheduled octreotide 100 mcg every 8 subcu.   #  Psycho-social/Spiritual Support:  - Support System: daughters, son-in-law, 8 grandchildren    # Discharge Planning:  To Be Determined  -Have already placed outpatient PMT referral to follow-up at [facility] with hope patient will be able to complete this.  Discussed with: Patient, RN  Thank you for allowing the palliative care team to participate in the care [name].  [name], DO Palliative Care Provider PMT # [phone]  If patient remains symptomatic despite maximum doses, please call PMT at [phone]  between 0700 and 1900. Outside of these hours, please call attending, as PMT does not have night coverage.

## 2024-02-24 NOTE — TOC Progression Note (Addendum)
 Transition of Care [facility]) - Progression Note    Patient Details  Name: DEVORA TORTORELLA MRN: [id number] Date of Birth: [birth year]  Transition of [facility]) CM/SW Contact  Beckie Busing, RN Phone Number:[phone]  02/24/2024, 3:28 PM  Clinical Narrative:    TOC continues to follow, patient is not medically ready for disposition planning continues on Heparin gtt, TPN, IV medications.  Will follow for disposition planning when patient gets closer to medical readiness.         Expected Discharge Plan and Services                                               Social Determinants of Health (SDOH) Interventions SDOH Screenings   Food Insecurity: No Food Insecurity (02/05/2024)  Housing: Low Risk  (02/05/2024)  Transportation Needs: No Transportation Needs (02/05/2024)  Utilities: Not At Risk (02/05/2024)  Social Connections: Socially Integrated (02/05/2024)  Tobacco Use: Unknown (02/04/2024)    Readmission Risk Interventions    02/23/2024   12:50 PM  Readmission Risk Prevention Plan  Transportation Screening Complete  PCP or Specialist Appt within 3-5 Days Complete  HRI or Home Care Consult Complete  Social Work Consult for Recovery Care Planning/Counseling Complete  Palliative Care Screening Not Applicable  Medication Review [employer]) Complete

## 2024-02-24 NOTE — Progress Notes (Signed)
 Nutrition Follow-up  DOCUMENTATION CODES:   Non-severe (moderate) malnutrition in context of acute illness/injury  INTERVENTION:   -TPN management per Pharmacy -Daily weights while on TPN   -Will monitor for diet advancement and GOC  NUTRITION DIAGNOSIS:   Moderate Malnutrition related to acute illness as evidenced by severe fat depletion, moderate fat depletion.  Ongoing.  GOAL:   Patient will meet greater than or equal to 90% of their needs  Meeting with TPN  MONITOR:   Diet advancement, Weight trends, Labs TPN  ASSESSMENT:   89 y.o. F, Presented to ED with complaints of abdominal distention and vomiting for the last 2 weeks. CT showed concerning findings and she was sent to ED for further evaluation. PMH; HTN, CKD.  2/7: admitted, NGT placed 2/8: s/p paracentesis, yield 100 ml 2/11: TPN initiation 2/15: chemotherapy 2/22: NGT removed, CLD started  IR to possibly place a venting G-tube. Awaiting plan. Pt now on clear liquids as well. Still having some nausea and distention. Having BMs.  TPN continues at 70 ml/hr, providing 1716 kcals and 89g protein.  Admission weight: 125 lbs Current weight: 126 lbs  Medications: Octreotide  Labs reviewed: CBGs: 110-218 Low Na   Diet Order:   Diet Order             Diet clear liquid Room service appropriate? Yes; Fluid consistency: Thin  Diet effective now                   EDUCATION NEEDS:   Education needs have been addressed  Skin:  Skin Assessment: Reviewed RN Assessment  Last BM:  2/26 -type 7  Height:   Ht Readings from Last 1 Encounters:  02/11/24 5\' 4"  (1.626 m)    Weight:   Wt Readings from Last 1 Encounters:  02/21/24 57.2 kg    BMI:  Body mass index is 21.65 kg/m.  Estimated Nutritional Needs:   Kcal:  1625-1900 kcal  Protein:  80-95g  Fluid:  1.9L/day  Tilda Franco, MS, RD, LDN Inpatient Clinical Dietitian Contact via Secure chat

## 2024-02-24 NOTE — Progress Notes (Signed)
 Mobility Specialist - Progress Note   02/24/24 1047  Mobility  Activity Ambulated with assistance in hallway  Level of Assistance Modified independent, requires aide device or extra time  Assistive Device Front wheel walker  Distance Ambulated (ft) 500 ft  Activity Response Tolerated well  Mobility Referral Yes  Mobility visit 1 Mobility  Mobility Specialist Start Time (ACUTE ONLY) 1034  Mobility Specialist Stop Time (ACUTE ONLY) 1047  Mobility Specialist Time Calculation (min) (ACUTE ONLY) 13 min   Pt received in recliner and agreeable to mobility. No complaints during session. Pt to recliner after session with all needs met.    [facility]

## 2024-02-24 NOTE — Progress Notes (Signed)
 PROGRESS NOTE  SHIMA COMPERE  UJW:[id number] DOB: Jul 03, 1935 DOA: 02/04/2024 PCP: Marden Noble, MD (Inactive)   Brief Narrative: Patient is a 89 year old female with history of hypertension, breast cancer who presented with abdominal distention, vomiting for 2 weeks.  CT abdomen was concerning for new metastatic disease from possible GYN origin, SBO.  Further workup showed peritoneal carcinomatosis with likely GYN malignancy.  GYN oncology, general surgery consulted pelvic MRI showed advanced GYN malignancy.  CT chest with contrast showed distal pulmonary present with cor pulmonale, started on heparin drip.  Received first cycle of chemo on 2/15.  Hospital course remarkable for persistent bowel obstruction, started on TPN, Decadron.  Palliative care following.  Now having bowel movements.  Small bowel SBO protocol shows nondilated colon with contrast on entire colon. NG tube removed on 2/22, started on clears.  After that she developed intermittent nausea, andominal distention. Consulted  IR for exploring option of venting PEG tube  Assessment & Plan:  Principal Problem:   Carcinomatosis peritonei causing obstruction Active Problems:   SBO (small bowel obstruction) (HCC)   GERD (gastroesophageal reflux disease)   Essential hypertension   Peritoneal carcinomatosis (HCC)   Anemia   Malnutrition of moderate degree   Palliative care encounter   [facility])   Malignant neoplasm metastatic to liver (HCC)   DNR (do not resuscitate)   Goals of care, counseling/discussion   Need for emotional support   Counseling and coordination of care   Nausea and vomiting  Malignant small bowel obstruction secondary to peritoneal carcinomatosis/ovarian adenocarcinoma with metastasis: Presented with abdominal distention, vomiting.GYN oncology, general surgery consulted.The  pelvic MRI showed advanced GYN malignancy.  CT chest with contrast showed distal PE, started on heparin drip.  Received first  cycle of chemo on 2/15.  Hospital course remarkable for persistent bowel obstruction, started on TPN, Decadron. 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  Small bowel SBO protocol on 2/22 showed nondilated colon with contrast on entire colon. NG tube removed.  She is having bowel movements and passing gas but has intermittent nausea,  abdominal distention.  CT abdomen/pelvis done on 2/25 did not show any significant changes from last time.   Consulted IR for PEG vent  Right-sided distal PE: Started on heparin drip.  Lower EXTR Doppler positive for DVT.  Eventual plan is to transition to Eliquis.  Echo showed preserved EF  Leukocytosis: She received Granix by oncology. Improving.   No fever.  Poor nutritional status/severe protein calorie malnutrition: On TPN  Hypertension: Blood pressure soft so losartan will be discontinued for now  GERD: Continue PPI  Normocytic anemia: Currently hemoglobin stable  Goals of care:Palliative care was following for goals of care .CODE STATUS DNR   Nutrition Problem: Moderate Malnutrition Etiology: acute illness    DVT prophylaxis:iv heparin     Code Status: Limited: Do not attempt resuscitation (DNR) -DNR-LIMITED -Do Not Intubate/DNI   Family Communication:  discussed with son-in-law Dr. Venetia Maxon on at bedside on 2/25.  Discussed with daughter at bedside on 2/26   Patient status:Inpatient  Patient is from :Home  Anticipated discharge FA:[id number]  Estimated DC date: Not sure at the moment   Consultants: General Surgery, palliative care, oncology,IR  Procedures: None yet  Antimicrobials:  Anti-infectives (From admission, onward)    None       Subjective: Patient seen and examined at bedside today.  She was lying comfortably in the chair.  She has mild abdominal distention today.  She  had a bowel movement this morning.  No vomiting or nausea.  Passing gas.  She is interested  to talk to IR about the option of venting PEG/G-tube  Objective: Vitals:   02/23/24 0453 02/23/24 1311 02/23/24 2110 02/24/24 0414  BP: (!) 110/48 111/62 117/61 (!) 108/55  Pulse: 74 74 66 70  Resp: 17 18 15 18   Temp: (!) 97.4 F (36.3 C) 98.5 F (36.9 C) 97.6 F (36.4 C) 98.2 F (36.8 C)  TempSrc: Oral Oral Oral Oral  SpO2: 95% 93% 92% 96%  Weight:      Height:        Intake/Output Summary (Last 24 hours) at 02/24/2024 1101 Last data filed at 02/23/2024 1813 Gross per 24 hour  Intake 1027.08 ml  Output --  Net 1027.08 ml   Filed Weights   02/18/24 0715 02/19/24 0713 02/21/24 0500  Weight: 57.5 kg 56.1 kg 57.2 kg    Examination:    General exam: Overall comfortable, not in distress, pleasant elderly female HEENT: PERRL Respiratory system:  no wheezes or crackles  Cardiovascular system: S1 & S2 heard, RRR.  Gastrointestinal system: Abdomen is mildly distended, soft and nontender. Central nervous system: Alert and oriented Extremities: No edema, no clubbing ,no cyanosis, PICC line Skin: No rashes, no ulcers,no icterus       Data Reviewed: I have personally reviewed following labs and imaging studies  CBC: Recent Labs  Lab 02/20/24 0651 02/21/24 0323 02/22/24 0545 02/23/24 0636 02/24/24 0601  WBC 13.0* 12.6* 12.7* 12.8* 10.8*  NEUTROABS 10.7* 8.3* 8.3* 8.4* 7.5  HGB 9.7* 9.5* 8.8* 9.0* 8.7*  HCT 31.2* 29.6* 28.0* 27.6* 26.7*  MCV 98.4 96.1 97.9 94.8 95.4  PLT 267 247 222 223 204   Basic Metabolic Panel: Recent Labs  Lab 02/19/24 0817 02/21/24 0323 02/22/24 0545 02/23/24 0636 02/24/24 0601  NA 137 136 137 133* 129*  K 4.4 4.4 4.2 4.5 4.8  CL 104 103 104 101 96*  CO2 25 25 23 23 25   GLUCOSE 103* 109* 107* 101* 128*  BUN 31* 38* 43* 34* 31*  CREATININE 0.79 0.83 0.93 0.81 0.94  CALCIUM 8.6* 8.5* 8.5* 8.3* 7.9*  MG  --  2.0 2.1  --  2.1  PHOS  --  4.0 3.4  --  3.6     No results found for this or any previous visit (from the past 240 hours).    Radiology Studies: CT ABDOMEN PELVIS W CONTRAST Result Date: 02/22/2024 CLINICAL DATA:  Ovarian cancer, bowel obstruction, restaging. * Tracking Code: BO * EXAM: CT ABDOMEN AND PELVIS WITH CONTRAST TECHNIQUE: Multidetector CT imaging of the abdomen and pelvis was performed using the standard protocol following bolus administration of intravenous contrast. RADIATION DOSE REDUCTION: This exam was performed according to the departmental dose-optimization program which includes automated exposure control, adjustment of the mA and/or kV according to patient size and/or use of iterative reconstruction technique. CONTRAST:  OMNIPAQUE IOHEXOL 300 MG/ML  SOLN COMPARISON:  Abdominopelvic CT 02/04/2024, pelvic MRI 02/11/2024 and chest CT 02/11/2024. FINDINGS: Lower chest: Small right-greater-than-left pleural effusions with dependent atelectasis at both lung bases, new from 02/04/2024 and increased from 02/11/2024. Contrast filled and mildly distended distal esophagus. Hepatobiliary: Multiple hypoenhancing hepatic metastases are again noted. The largest lesion superior to the gallbladder is less well-defined on the current study and may be slightly smaller, measuring 1.4 cm on image 26/2 (previously 1.9 x 1.5 cm). Other lesions are grossly stable, including a lesion more superiorly in the central  right lobe measuring 1.4 cm on image 18/2. Small hepatic cysts are also noted. No evidence of gallstones, gallbladder wall thickening or biliary dilatation. Pancreas: Unremarkable. No pancreatic ductal dilatation or surrounding inflammatory changes. Spleen: Stable 8 mm low-density splenic lesion on image 24/2, likely a cyst. The spleen is normal in size without other focal abnormality. Adrenals/Urinary Tract: Both adrenal glands appear normal. No evidence of urinary tract calculus, suspicious renal lesion or hydronephrosis. The right renal pelvis is mildly dilated without secondary signs of ureteral obstruction. There are  renal sinus cysts bilaterally. The bladder appears unremarkable for its degree of distention. Stomach/Bowel: Enteric contrast has passed into the mid colon. The stomach remains mildly distended. Small bowel distension has mildly improved. The colon remains decompressed. There is a persistent distal colonic intussusception with the sigmoid colon invaginated into the rectum. Appearance is similar to the previous CT from 02/04/2024. No evidence of bowel perforation. Vascular/Lymphatic: Small retroperitoneal and mesenteric lymph nodes appear unchanged. Mild aortic and branch vessel atherosclerosis without evidence of aneurysm or large vessel occlusion. The portal, superior mesenteric and splenic veins remain patent. Reproductive: Persistent ill-defined mass involving the cervix, measuring approximately 3.4 x 4.0 cm on image 62/2. There is mild distension of the endometrial cavity. A small peripherally calcified uterine fundal fibroid is noted. Persistent ill-defined adnexal masses bilaterally, measuring approximately 4.0 x 2.6 cm on the right (image 54/2) and 2.9 x 1.9 cm on the left (image 57/2). Other: Small to moderate volume of ascites, similar to previous CT. Diffuse omental nodularity may be minimally improved in the interval with a dominant component in the right mid abdomen measuring 3.8 x 2.3 cm on image 39/2. No extravasated enteric contrast or pneumoperitoneum. Musculoskeletal: No acute or significant osseous findings. Mild lumbar spondylosis. IMPRESSION: 1. Persistent distal colonic intussusception with the sigmoid colon invaginated into the rectum. Appearance is similar to the previous CT from 02/04/2024 and may relate to underlying peritoneal tumor. 2. Mild improvement in distal partial small bowel obstruction with enteric contrast passing into the mid colon. The colon remains decompressed. 3. Persistent ill-defined cervical and bilateral adnexal masses consistent with known ovarian cancer. Associated  peritoneal carcinomatosis may be minimally improved. No definite progressive disease. 4. Persistent hepatic metastases, one of which may be slightly smaller in the interval. 5. Small right-greater-than-left pleural effusions with dependent atelectasis at both lung bases, new from 02/04/2024 and increased from 02/11/2024. 6.  Aortic Atherosclerosis (ICD10-I70.0). Electronically Signed   By: Carey Bullocks M.D.   On: 02/22/2024 16:08     Scheduled Meds:  Chlorhexidine Gluconate Cloth  6 each Topical Daily   dexamethasone (DECADRON) injection  8 mg Intravenous Daily   insulin aspart  0-9 Units Subcutaneous Q8H   octreotide  100 mcg Subcutaneous Q8H   pantoprazole (PROTONIX) IV  40 mg Intravenous Q12H   sodium chloride flush  10-40 mL Intracatheter Q12H   Continuous Infusions:  heparin 900 Units/hr (02/23/24 0712)   TPN ADULT (ION) 70 mL/hr at 02/23/24 1809   TPN ADULT (ION)       LOS: 20 days   Burnadette Pop, MD [facility] P2/27/2025, 11:01 AM

## 2024-02-24 NOTE — Plan of Care (Signed)
   Problem: Education: Goal: Knowledge of General Education information will improve Description Including pain rating scale, medication(s)/side effects and non-pharmacologic comfort measures Outcome: Progressing

## 2024-02-24 NOTE — Consult Note (Addendum)
 Chief Complaint: Abdominal distension, occasional nausea, malignant partial small bowel obstruction secondary to peritoneal carcinomatosis/ovarian cancer; referred for consideration of venting gastrostomy tube placement  Referring Provider(s): Cross,M,NP  Supervising Physician: Gilmer Mor  Patient Status: [facility] - In-pt  History of Present Illness: Tracy Preston is an 89 y.o. 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 Pt also has hx PE/LLE DVT on IV heparin, and partial malignant SBO with recent NG tube placement and improvement in abd distension /N/V, since removed. Latest CT A/P on 02/22/24 revealed:  1. Persistent distal colonic intussusception with the sigmoid colon invaginated into the rectum. Appearance is similar to the previous CT from 02/04/2024 and may relate to underlying peritoneal tumor. 2. Mild improvement in distal partial small bowel obstruction with enteric contrast passing into the mid colon. The colon remains decompressed. 3. Persistent ill-defined cervical and bilateral adnexal masses consistent with known ovarian cancer. Associated peritoneal carcinomatosis may be minimally improved. No definite progressive disease. 4. Persistent hepatic metastases, one of which may be slightly smaller in the interval. 5. Small right-greater-than-left pleural effusions with dependent atelectasis at both lung bases, new from 02/04/2024 and increased from 02/11/2024. 6.  Aortic Atherosclerosis   Pt does not want any surgical procedures. Request now received from gyn/onc for consideration of venting gastrostomy tube placement. She is afebrile, WBC 10.8, hgb 8.7, plts 204k, creat 0.94, t bili nl, PT/INR pend. She is on TPN.     Patient is DNR limited  Past Medical History:  Diagnosis Date   Cancer (HCC)    skin cancer  leg -no problems now   GERD (gastroesophageal reflux disease)    mild -no meds   Heart murmur    "faint"   Hypertension     Past Surgical History:  Procedure Laterality Date   BREAST BIOPSY  05/28/1984   benign-unknown which breast   COLONOSCOPY W/ POLYPECTOMY     COLONOSCOPY WITH PROPOFOL N/A 08/06/2015   Procedure: COLONOSCOPY WITH PROPOFOL;  Surgeon: Charolett Bumpers, MD;  Location: [facility];  Service: Endoscopy;  Laterality: N/A;   TONSILLECTOMY     child   TUBAL LIGATION      Allergies: Calcium-containing compounds  Medications: Prior to Admission medications   Medication Sig Start Date End Date Taking? Authorizing Provider  calcium-vitamin D (OSCAL WITH D) 500-200 MG-UNIT per tablet Take 1 tablet by mouth every 7 (seven) days.   Yes [provider]  Cholecalciferol (VITAMIN D3) 1000 units CAPS Take 1,000 Units by mouth daily with breakfast.   Yes [provider]  clotrimazole (ANTI-FUNGAL) 1 % cream Apply 1 Application topically daily as needed (for any yeast issues- affected areas).   Yes [provider]  losartan (COZAAR) 100 MG tablet Take 100 mg by mouth every morning.   Yes [provider]     Family History  Problem Relation Age of Onset   Breast cancer Sister    Breast cancer Sister    Breast cancer Sister    Colon cancer Paternal Aunt     Social History   Socioeconomic History   Marital status: Married    Spouse name: Not on file   Number of children: Not on file   Years of education: Not on file   Highest education level: Not on file  Occupational History   Not on file  Tobacco Use   Smoking status: Never  Smokeless tobacco: Not on file  Substance and Sexual Activity   Alcohol use: Yes    Comment: 1 glass wine nightly   Drug use: No   Sexual activity: Not on file  Other Topics Concern   Not on file  Social History Narrative   Not on file   Social Drivers of Health   Financial Resource Strain: Not on  file  Food Insecurity: No Food Insecurity (02/05/2024)   Hunger Vital Sign    Worried About Running Out of Food in the Last Year: Never true    Ran Out of Food in the Last Year: Never true  Transportation Needs: No Transportation Needs (02/05/2024)   PRAPARE - Administrator, Civil Service (Medical): No    Lack of Transportation (Non-Medical): No  Physical Activity: Not on file  Stress: Not on file  Social Connections: Socially Integrated (02/05/2024)   Social Connection and Isolation Panel [NHANES]    Frequency of Communication with Friends and Family: More than three times a week    Frequency of Social Gatherings with Friends and Family: Twice a week    Attends Religious Services: More than 4 times per year    Active Member of [employer] or Organizations: Yes    Attends Banker Meetings: 1 to 4 times per year    Marital Status: Married       Review of Systems : currently denies fever,HA,CP, worsening dyspnea, cough, back pain,N/V or bleeding   Vital Signs: BP 136/78 (BP Location: Left Arm)   Pulse 75   Temp 98.2 F (36.8 C) (Oral)   Resp 16   Ht 5\' 4"  (1.626 m)   Wt 126 lb 1.7 oz (57.2 kg)   SpO2 91%   BMI 21.65 kg/m   Advance Care Plan: no documents on file.  Physical Exam: awake/alert; chest- sl dim BS bases; heart- RRR, +murmur; abd- mild- mod dist, few BS, currently not sig tender; no LE edema  Imaging: CT ABDOMEN PELVIS W CONTRAST Result Date: 02/22/2024 CLINICAL DATA:  Ovarian cancer, bowel obstruction, restaging. * Tracking Code: BO * EXAM: CT ABDOMEN AND PELVIS WITH CONTRAST TECHNIQUE: Multidetector CT imaging of the abdomen and pelvis was performed using the standard protocol following bolus administration of intravenous contrast. RADIATION DOSE REDUCTION: This exam was performed according to the departmental dose-optimization program which includes automated exposure control, adjustment of the mA and/or kV according to patient size and/or use of  iterative reconstruction technique. CONTRAST:  OMNIPAQUE IOHEXOL 300 MG/ML  SOLN COMPARISON:  Abdominopelvic CT 02/04/2024, pelvic MRI 02/11/2024 and chest CT 02/11/2024. FINDINGS: Lower chest: Small right-greater-than-left pleural effusions with dependent atelectasis at both lung bases, new from 02/04/2024 and increased from 02/11/2024. Contrast filled and mildly distended distal esophagus. Hepatobiliary: Multiple hypoenhancing hepatic metastases are again noted. The largest lesion superior to the gallbladder is less well-defined on the current study and may be slightly smaller, measuring 1.4 cm on image 26/2 (previously 1.9 x 1.5 cm). Other lesions are grossly stable, including a lesion more superiorly in the central right lobe measuring 1.4 cm on image 18/2. Small hepatic cysts are also noted. No evidence of gallstones, gallbladder wall thickening or biliary dilatation. Pancreas: Unremarkable. No pancreatic ductal dilatation or surrounding inflammatory changes. Spleen: Stable 8 mm low-density splenic lesion on image 24/2, likely a cyst. The spleen is normal in size without other focal abnormality. Adrenals/Urinary Tract: Both adrenal glands appear normal. No evidence of urinary tract calculus, suspicious renal lesion or hydronephrosis. The  right renal pelvis is mildly dilated without secondary signs of ureteral obstruction. There are renal sinus cysts bilaterally. The bladder appears unremarkable for its degree of distention. Stomach/Bowel: Enteric contrast has passed into the mid colon. The stomach remains mildly distended. Small bowel distension has mildly improved. The colon remains decompressed. There is a persistent distal colonic intussusception with the sigmoid colon invaginated into the rectum. Appearance is similar to the previous CT from 02/04/2024. No evidence of bowel perforation. Vascular/Lymphatic: Small retroperitoneal and mesenteric lymph nodes appear unchanged. Mild aortic and branch vessel  atherosclerosis without evidence of aneurysm or large vessel occlusion. The portal, superior mesenteric and splenic veins remain patent. Reproductive: Persistent ill-defined mass involving the cervix, measuring approximately 3.4 x 4.0 cm on image 62/2. There is mild distension of the endometrial cavity. A small peripherally calcified uterine fundal fibroid is noted. Persistent ill-defined adnexal masses bilaterally, measuring approximately 4.0 x 2.6 cm on the right (image 54/2) and 2.9 x 1.9 cm on the left (image 57/2). Other: Small to moderate volume of ascites, similar to previous CT. Diffuse omental nodularity may be minimally improved in the interval with a dominant component in the right mid abdomen measuring 3.8 x 2.3 cm on image 39/2. No extravasated enteric contrast or pneumoperitoneum. Musculoskeletal: No acute or significant osseous findings. Mild lumbar spondylosis. IMPRESSION: 1. Persistent distal colonic intussusception with the sigmoid colon invaginated into the rectum. Appearance is similar to the previous CT from 02/04/2024 and may relate to underlying peritoneal tumor. 2. Mild improvement in distal partial small bowel obstruction with enteric contrast passing into the mid colon. The colon remains decompressed. 3. Persistent ill-defined cervical and bilateral adnexal masses consistent with known ovarian cancer. Associated peritoneal carcinomatosis may be minimally improved. No definite progressive disease. 4. Persistent hepatic metastases, one of which may be slightly smaller in the interval. 5. Small right-greater-than-left pleural effusions with dependent atelectasis at both lung bases, new from 02/04/2024 and increased from 02/11/2024. 6.  Aortic Atherosclerosis (ICD10-I70.0). Electronically Signed   By: Carey Bullocks M.D.   On: 02/22/2024 16:08   DG Abd Portable 1V-Small Bowel Obstruction Protocol-initial, 8 hr delay Result Date: 02/18/2024 CLINICAL DATA:  8 hour delay small bowel  obstruction EXAM: PORTABLE ABDOMEN - 1 VIEW COMPARISON:  Abdominal x-ray 02/18/2024 FINDINGS: Enteric tube tip is in the fundus of the stomach with sidehole at the level of the gastroesophageal junction. Contrast is seen throughout nondilated colon to the level of the rectum. No dilated bowel loops are seen. IMPRESSION: 1. Enteric tube tip is in the fundus of the stomach with sidehole at the level of the gastroesophageal junction. Recommend advancement 8 cm. 2. Contrast is seen throughout nondilated colon to the level of the rectum. No evidence for bowel obstruction. Electronically Signed   By: Darliss Cheney M.D.   On: 02/18/2024 20:57   DG Abd Portable 1V-Small Bowel Protocol-Position Verification Result Date: 02/18/2024 CLINICAL DATA:  [id number] Encounter for imaging study to confirm nasogastric (NG) tube placement [id number] EXAM: PORTABLE ABDOMEN - 1 VIEW COMPARISON:  02/10/2024 abdominal radiograph FINDINGS: Enteric tube terminates in the proximal stomach. No dilated small bowel loops. No evidence of pneumatosis or pneumoperitoneum. Mild elevation of the left hemidiaphragm with clear lung bases. IMPRESSION: Enteric tube terminates in the proximal stomach. Electronically Signed   By: Delbert Phenix M.D.   On: 02/18/2024 13:42   ECHOCARDIOGRAM COMPLETE Result Date: 02/13/2024    ECHOCARDIOGRAM REPORT   Patient Name:   Tracy Preston Date of Exam: 02/13/2024 Medical Rec #:  [id number]          Height:       64.0 in Accession #:    [id number]         Weight:       118.9 lb Date of Birth:  03-Apr-1935          BSA:          1.568 m Patient Age:    88 years           BP:           142/72 mmHg Patient Gender: F                  HR:           71 bpm. Exam Location:  Inpatient Procedure: 2D Echo, Cardiac Doppler and Color Doppler (Both Spectral and Color            Flow Doppler were utilized during procedure). Indications:    Cardiomyopathy- Unspecified I42.9  History:        Patient has no prior history of Echocardiogram  examinations.                 Risk Factors:Hypertension.  Sonographer:    Lucendia Herrlich RCS Referring Phys: [id number] Doreen Salvage AMIN IMPRESSIONS  1. Left ventricular ejection fraction, by estimation, is 65 to 70%. The left ventricle has normal function. The left ventricle has no regional wall motion abnormalities. Left ventricular diastolic parameters were normal.  2. Right ventricular systolic function is normal. The right ventricular size is normal. There is normal pulmonary artery systolic pressure. The estimated right ventricular systolic pressure is 23.8 mmHg.  3. The mitral valve is grossly normal. No evidence of mitral valve regurgitation. No evidence of mitral stenosis.  4. The aortic valve is calcified. Aortic valve regurgitation is trivial. Aortic valve sclerosis/calcification is present, without any evidence of aortic stenosis.  5. The inferior vena cava is normal in size with greater than 50% respiratory variability, suggesting right atrial pressure of 3 mmHg. FINDINGS  Left Ventricle: Left ventricular ejection fraction, by estimation, is 65 to 70%. The left ventricle has normal function. The left ventricle has no regional wall motion abnormalities. Strain imaging was not performed. The left ventricular internal cavity  size was normal in size. There is no left ventricular hypertrophy. Left ventricular diastolic parameters were normal. Right Ventricle: The right ventricular size is normal. No increase in right ventricular wall thickness. Right ventricular systolic function is normal. There is normal pulmonary artery systolic pressure. The tricuspid regurgitant velocity is 2.28 m/s, and  with an assumed right atrial pressure of 3 mmHg, the estimated right ventricular systolic pressure is 23.8 mmHg. Left Atrium: Left atrial size was normal in size. Right Atrium: Right atrial size was normal in size. Pericardium: There is no evidence of pericardial effusion. Mitral Valve: The mitral valve is grossly normal.  No evidence of mitral valve regurgitation. No evidence of mitral valve stenosis. Tricuspid Valve: The tricuspid valve is grossly normal. Tricuspid valve regurgitation is trivial. No evidence of tricuspid stenosis. Aortic Valve: The aortic valve is calcified. Aortic valve regurgitation is trivial. Aortic regurgitation PHT measures 299 msec. Aortic valve sclerosis/calcification is present, without any evidence of aortic stenosis. Aortic valve mean gradient measures 9.0 mmHg. Aortic valve peak gradient measures 11.1 mmHg. Aortic valve area, by VTI measures 1.23 cm. Pulmonic Valve: The pulmonic valve was grossly normal. Pulmonic valve regurgitation is trivial. No evidence of pulmonic stenosis. Aorta: The aortic root  and ascending aorta are structurally normal, with no evidence of dilitation. Venous: The inferior vena cava is normal in size with greater than 50% respiratory variability, suggesting right atrial pressure of 3 mmHg. IAS/Shunts: The atrial septum is grossly normal. Additional Comments: 3D imaging was not performed. Mild ascites is present.  LEFT VENTRICLE PLAX 2D LVIDd:         4.00 cm   Diastology LVIDs:         2.60 cm   LV e' medial:    9.25 cm/s LV PW:         0.90 cm   LV E/e' medial:  6.4 LV IVS:        1.00 cm   LV e' lateral:   12.20 cm/s LVOT diam:     1.80 cm   LV E/e' lateral: 4.9 LV SV:         48 LV SV Index:   31 LVOT Area:     2.54 cm  RIGHT VENTRICLE             IVC RV S prime:     11.00 cm/s  IVC diam: 1.20 cm TAPSE (M-mode): 1.8 cm LEFT ATRIUM             Index        RIGHT ATRIUM           Index LA diam:        3.70 cm 2.36 cm/m   RA Area:     12.50 cm LA Vol (A2C):   29.1 ml 18.55 ml/m  RA Volume:   29.10 ml  18.55 ml/m LA Vol (A4C):   40.0 ml 25.50 ml/m LA Biplane Vol: 34.3 ml 21.87 ml/m  AORTIC VALVE                     PULMONIC VALVE AV Area (Vmax):    1.61 cm      PR End Diast Vel: 2.12 msec AV Area (Vmean):   1.25 cm AV Area (VTI):     1.23 cm AV Vmax:           166.67  cm/s AV Vmean:          138.000 cm/s AV VTI:            0.392 m AV Peak Grad:      11.1 mmHg AV Mean Grad:      9.0 mmHg LVOT Vmax:         105.50 cm/s LVOT Vmean:        67.950 cm/s LVOT VTI:          0.190 m LVOT/AV VTI ratio: 0.48 AI PHT:            299 msec  AORTA Ao Root diam: 3.10 cm Ao Asc diam:  2.80 cm MITRAL VALVE               TRICUSPID VALVE MV Area (PHT): 3.27 cm    TR Peak grad:   20.8 mmHg MV Decel Time: 232 msec    TR Vmax:        228.00 cm/s MV E velocity: 59.60 cm/s MV A velocity: 76.60 cm/s  SHUNTS MV E/A ratio:  0.78        Systemic VTI:  0.19 m                            Systemic Diam: 1.80 cm Gerri Spore  O'Neal MD Electronically signed by Lennie Odor MD Signature Date/Time: 02/13/2024/8:53:15 PM    Final    VAS [place] LOWER EXTREMITY VENOUS (DVT) Result Date: 02/12/2024  Lower Venous DVT Study Patient Name:  Tracy Preston  Date of Exam:   02/12/2024 Medical Rec #: [id number]           Accession #:    [id number] Date of Birth: 28-Sep-1935           Patient Gender: F Patient Age:   58 years Exam Location:  [facility] Procedure:      VAS [place] LOWER EXTREMITY VENOUS (DVT) Referring Phys: Stephania Fragmin --------------------------------------------------------------------------------  Indications: Pulmonary embolism.  Risk Factors: Newly diagnosed peritoneal carcinomatosis. Comparison Study: No previous exams Performing Technologist: Jody Hill RVT, RDMS  Examination Guidelines: A complete evaluation includes B-mode imaging, spectral Doppler, color Doppler, and power Doppler as needed of all accessible portions of each vessel. Bilateral testing is considered an integral part of a complete examination. Limited examinations for reoccurring indications may be performed as noted. The reflux portion of the exam is performed with the patient in reverse Trendelenburg.  +---------+---------------+---------+-----------+----------+--------------+ RIGHT     CompressibilityPhasicitySpontaneityPropertiesThrombus Aging +---------+---------------+---------+-----------+----------+--------------+ CFV      Full           Yes      No                                  +---------+---------------+---------+-----------+----------+--------------+ SFJ      Full                                                        +---------+---------------+---------+-----------+----------+--------------+ FV Prox  Full           Yes      Yes                                 +---------+---------------+---------+-----------+----------+--------------+ FV Mid   Full           Yes      Yes                                 +---------+---------------+---------+-----------+----------+--------------+ FV DistalFull           Yes      Yes                                 +---------+---------------+---------+-----------+----------+--------------+ PFV      Full                                                        +---------+---------------+---------+-----------+----------+--------------+ POP      Full           Yes      Yes                                 +---------+---------------+---------+-----------+----------+--------------+  PTV      Full                                                        +---------+---------------+---------+-----------+----------+--------------+ PERO     Full                                                        +---------+---------------+---------+-----------+----------+--------------+   +---------+---------------+---------+-----------+----------+--------------+ LEFT     CompressibilityPhasicitySpontaneityPropertiesThrombus Aging +---------+---------------+---------+-----------+----------+--------------+ CFV      Full           Yes      Yes                                 +---------+---------------+---------+-----------+----------+--------------+ SFJ      Full                                                         +---------+---------------+---------+-----------+----------+--------------+ FV Prox  Full           Yes      Yes                                 +---------+---------------+---------+-----------+----------+--------------+ FV Mid   Full           Yes      Yes                                 +---------+---------------+---------+-----------+----------+--------------+ FV DistalFull           Yes      Yes                                 +---------+---------------+---------+-----------+----------+--------------+ PFV      Full                                                        +---------+---------------+---------+-----------+----------+--------------+ POP      Full           Yes      Yes                                 +---------+---------------+---------+-----------+----------+--------------+ PTV      None           No       No                   Acute          +---------+---------------+---------+-----------+----------+--------------+ PERO     None  No       No                   Acute          +---------+---------------+---------+-----------+----------+--------------+     Summary: BILATERAL: -No evidence of popliteal cyst, bilaterally. RIGHT: - There is no evidence of deep vein thrombosis in the lower extremity.  LEFT: - Findings consistent with acute deep vein thrombosis involving the left peroneal veins, and left posterior tibial veins.   *See table(s) above for measurements and observations. Electronically signed by Carolynn Sayers on 02/12/2024 at 8:18:12 PM.    Final    CT CHEST W CONTRAST Addendum Date: 02/11/2024 ADDENDUM REPORT: 02/11/2024 15:54 ADDENDUM: Critical Value/emergent results were called by telephone at the time of interpretation on 02/11/2024 at 3:54 pm to provider Eugene Garnet, MD, who verbally acknowledged these results. Electronically Signed   By: Delbert Phenix M.D.   On: 02/11/2024 15:54   Result Date:  02/11/2024 CLINICAL DATA:  Peritoneal carcinomatosis. Chest staging. * Tracking Code: BO * EXAM: CT CHEST WITH CONTRAST TECHNIQUE: Multidetector CT imaging of the chest was performed during intravenous contrast administration. RADIATION DOSE REDUCTION: This exam was performed according to the departmental dose-optimization program which includes automated exposure control, adjustment of the mA and/or kV according to patient size and/or use of iterative reconstruction technique. CONTRAST:  75mL OMNIPAQUE IOHEXOL 300 MG/ML  SOLN COMPARISON:  02/04/2024 CT abdomen/pelvis. 02/08/2024 chest radiograph. FINDINGS: Cardiovascular: Normal heart size. No significant pericardial effusion/thickening. Left anterior descending coronary atherosclerosis. Right PICC terminates in the middle third of the SVC. Atherosclerotic nonaneurysmal thoracic aorta. Incidental acute pulmonary embolism involving the distal right pulmonary artery and lobar and segmental right upper lobe and right middle lobe pulmonary artery branches. No saddle embolus. Normal caliber main pulmonary artery. RV/LV ratio 1.0. Mediastinum/Nodes: No significant thyroid nodules. Fluid level in the mid to lower thoracic esophagus with enteric tube terminating in the proximal stomach. No right axillary adenopathy. Mild left axillary adenopathy up to 1.1 cm (series 2/image 61). No pathologically enlarged mediastinal or hilar nodes. Lungs/Pleura: No pneumothorax. Small layering right pleural effusion. No left pleural effusion. Calcified 1 cm posterior right lower lobe granuloma. Mild dependent bilateral lower lobe atelectasis. No acute consolidative airspace disease, lung masses or significant pulmonary nodules. Upper abdomen: Moderate upper abdominal ascites with nodular soft tissue caking in the visualized anterior left upper peritoneum, as seen on recent CT abdomen study. Several hypodense liver lesions scattered throughout the visualized liver, unchanged, largest 1.6  cm in the inferior liver on series 2/image 155. Musculoskeletal: No aggressive appearing focal osseous lesions. Mild thoracic spondylosis. IMPRESSION: 1. Incidental acute pulmonary embolism involving the distal right pulmonary artery and lobar and segmental right upper lobe and right middle lobe pulmonary artery branches. No saddle embolus. Normal caliber main pulmonary artery. Borderline elevated RV/LV Ratio = 1.0. 2. Small layering right pleural effusion. Mild dependent bilateral lower lobe atelectasis. 3. Mild left axillary adenopathy, cannot exclude metastatic disease. No other evidence of metastatic disease in the chest. 4. One vessel coronary atherosclerosis. 5. Moderate upper abdominal ascites with nodular soft tissue caking in the visualized anterior left upper peritoneum, as seen on recent CT abdomen study, compatible with peritoneal carcinomatosis. Several hypodense liver lesions scattered throughout the visualized liver, unchanged, suspicious for liver metastases. Electronically Signed: By: Delbert Phenix M.D. On: 02/11/2024 15:28   MR PELVIS W WO CONTRAST Result Date: 02/11/2024 CLINICAL DATA:  "Occult malignancy". Peritoneal carcinomatosis on abdominopelvic CT with bowel obstruction.  EXAM: MRI PELVIS WITHOUT AND WITH CONTRAST TECHNIQUE: Multiplanar multisequence MR imaging of the pelvis was performed both before and after administration of intravenous contrast. CONTRAST:  5mL GADAVIST GADOBUTROL 1 MMOL/ML IV SOLN COMPARISON:  CT of 02/04/2024 FINDINGS: Urinary Tract: Primarily decompressed urinary bladder. Left renal sinus cysts without hydronephrosis. Bowel: Fluid-filled small bowel loops within the upper and mid pelvis measure up to 2.0 cm on 19/17. Improved from 3.4 cm on the prior exam. Again identified is an intussusception of the sigmoid into the rectum. There is mild hyperenhancement within the lead point including on 41/17, without restricted diffusion in this area. Vascular/Lymphatic:  Ileocolic mesenteric adenopathy including at 2.2 x 1.8 cm on 08/17. No pelvic sidewall adenopathy. Reproductive: The endometrium is thickened for age including at 1.0 cm on 18/4. This is followed to the level of the cervix and lower uterine segment, where soft tissue fullness is again identified including on 18/4 and 21/3. Correlate restricted diffusion on 22/11. Example 3.7 x 3.7 cm on 31/17. There is amorphous soft tissue fullness within both adnexa, greater right than left. Example restricted diffusion on 17/11. The right adnexal soft tissue fullness measures 3.3 x 3.3 cm on 18/17. No separate ovarian tissue identified. Other: Peritoneal carcinomatosis, as evidenced by diffuse peritoneal enhancement and restricted diffusion (example 22/11). Small volume pelvic fluid. Musculoskeletal: No acute osseous abnormality. IMPRESSION: 1. Abnormal amorphous soft tissue throughout lower uterine segment and cervix could represent primary uterine/cervical carcinoma or metastatic disease. 2. Diffuse peritoneal carcinomatosis as better imaged on dedicated CT. Ileocolic mesenteric nodal metastasis. 3. Right greater than left adnexal soft tissue fullness likely represents ovarian metastasis. 4. Chronic or recurrent sigmoid/rectal intussusception since 02/04/2024. Vague hyperenhancement in the region of the lead point, without dominant mass. Recommend physical exam correlation to exclude either primary rectosigmoid carcinoma or serosal sigmoid implant lead point. 5. Small volume pelvic fluid. 6. Improved small bowel dilatation since prior CT. Electronically Signed   By: Jeronimo Greaves M.D.   On: 02/11/2024 15:42   DG Abd 1 View Result Date: 02/10/2024 CLINICAL DATA:  [id number] Abdominal distention [id number] SBO (small bowel obstruction) (HCC) [id number] EXAM: ABDOMEN - 1 VIEW COMPARISON:  02/08/2024 FINDINGS: NG tube terminates within the proximal stomach. Worsening small bowel dilation in the mid abdomen measuring up to 5.3 cm  (previously 3.3 cm. Paucity of bowel gas within the colon. No gross free intraperitoneal air. IMPRESSION: 1. Worsening small bowel obstruction. 2. NG tube terminates within the proximal stomach. Recommend advancement. Electronically Signed   By: Duanne Guess D.O.   On: 02/10/2024 09:35   DG Chest 1 View Result Date: 02/08/2024 CLINICAL DATA:  PICC line placement EXAM: CHEST  1 VIEW COMPARISON:  02/05/2024 FINDINGS: Right PICC line in place with the tip in the SVC. NG tube is in the stomach. Heart and mediastinal contours within normal limits. No confluent opacities or effusions. No acute bony abnormality. IMPRESSION: Right PICC line tip in the SVC. No active cardiopulmonary disease. Electronically Signed   By: Charlett Nose M.D.   On: 02/08/2024 17:51   [place] EKG SITE RITE Result Date: 02/08/2024 If Site Rite image not attached, placement could not be confirmed due to current cardiac rhythm.  DG Abd Portable 1V Result Date: 02/08/2024 CLINICAL DATA:  Check gastric catheter placement EXAM: PORTABLE ABDOMEN - 1 VIEW COMPARISON:  02/04/2024 FINDINGS: Scattered large and small bowel gas is noted. Mild small bowel dilatation is noted significantly improved when compared with the prior exam. Gastric catheter is noted with  the tip in the stomach. No free air is noted. IMPRESSION: Persistent but significantly improved small bowel dilatation when compared with the prior exam. Electronically Signed   By: Alcide Clever M.D.   On: 02/08/2024 09:34   US Paracentesis Result Date: 02/05/2024 INDICATION: 89 year old with new abdominal distention, ascites. Request made for diagnostic and therapeutic paracentesis. EXAM: ULTRASOUND GUIDED DIAGNOSTIC  PARACENTESIS MEDICATIONS: 10 mL 1% lidocaine. COMPLICATIONS: None immediate. PROCEDURE: Informed written consent was obtained from the patient after a discussion of the risks, benefits and alternatives to treatment. A timeout was performed prior to the initiation of the  procedure. Initial ultrasound scanning demonstrates a small amount of ascites within the left lateral abdomen interlooped with bowel. The left lateral abdomen was prepped and draped in the usual sterile fashion. 1% lidocaine was used for local anesthesia. Following this, a 19 gauge, 7-cm, Yueh catheter was introduced. An ultrasound image was saved for documentation purposes. The paracentesis was performed. The catheter was removed and a dressing was applied. The patient tolerated the procedure well without immediate post procedural complication. FINDINGS: A total of approximately 100 mL of yellow fluid was removed. Samples were sent to the laboratory as requested by the clinical team. IMPRESSION: Successful ultrasound-guided paracentesis yielding 100 mL of peritoneal fluid. Performed by: Loyce Dys PA-C Electronically Signed   By: Malachy Moan M.D.   On: 02/05/2024 14:31   US PELVIC COMPLETE WITH TRANSVAGINAL Result Date: 02/05/2024 CLINICAL DATA:  89 year old female with ascites, peritoneal carcinomatosis on CT Abdomen and Pelvis yesterday. EXAM: TRANSABDOMINAL AND TRANSVAGINAL ULTRASOUND OF PELVIS TECHNIQUE: Both transabdominal and transvaginal ultrasound examinations of the pelvis were performed. Transabdominal technique was performed for global imaging of the pelvis including uterus, ovaries, adnexal regions, and pelvic cul-de-sac. It was necessary to proceed with endovaginal exam following the transabdominal exam to visualize the ovaries. COMPARISON:  CT Abdomen and Pelvis 02/04/2024. FINDINGS: Uterus Measurements: 6.9 x 3.9 x 3.9 cm = volume: 41 mL. No obvious myometrial mass. Endometrium Heterogeneous hypoechoic area at the fundus endometrium (image 49 of series 1). This area encompasses about 2 cm (image 55). See also cine series 3 images. Otherwise the echogenic endometrial stripe is up to 5 mm. Right ovary Measurements: 3.5 x 2.1 x 2.8 cm = volume: 11 mL. No ovarian mass identified. Left ovary  Measurements: 2.3 x 1.5 x 1.7 cm = volume: 3 mL. No ovarian mass identified. Other findings Small volume ascites again visible in the pelvis. IMPRESSION: 1. No ovarian mass identified by ultrasound. 2. There is a 2 cm heterogeneous area at the uterine fundus which is favored to be endometrial related. Elsewhere the endometrial stripe is about 5 mm. An Endometrial Malignancy is not excluded. 3. Ascites, in conjunction with the extensive additional abdominal and pelvic abnormalities demonstrated by CT yesterday. Electronically Signed   By: Odessa Fleming M.D.   On: 02/05/2024 12:17   DG Chest Portable 1 View Result Date: 02/05/2024 CLINICAL DATA:  NG tube replacement confirmation EXAM: PORTABLE CHEST - 1 VIEW COMPARISON:  02/04/2024 FINDINGS: Cardiomediastinal silhouette and pulmonary vasculature are within normal limits. Lungs are clear. Nasogastric tube extends to the left upper quadrant the side hole located at the level of the gastroesophageal junction. It is slightly retracted compared to prior exam. IMPRESSION: Nasogastric tube terminates in the left upper quadrant in the expected site of the stomach. The side hole of the NG tube is located at the level of the gastroesophageal junction. Electronically Signed   By: Mauri Reading  Mir  M.D.   On: 02/05/2024 07:38   DG Chest Portable 1 View Result Date: 02/04/2024 CLINICAL DATA:  NG tube placement. Abdominal discomfort, heartburn, and nausea. EXAM: PORTABLE CHEST 1 VIEW COMPARISON:  None Available. FINDINGS: The heart size and mediastinal contours are within normal limits. There is atherosclerotic calcification of the aorta. No consolidation, effusion, or pneumothorax. An enteric tube terminates in the stomach. No acute osseous abnormality. IMPRESSION: 1. No active disease. 2. Enteric tube terminates in the stomach. Electronically Signed   By: Thornell Sartorius M.D.   On: 02/04/2024 21:20   CT ABDOMEN PELVIS W CONTRAST Result Date: 02/04/2024 CLINICAL DATA:  Abdominal pain,  bloating, nausea and vomiting for 2 weeks. * Tracking Code: BO * EXAM: CT ABDOMEN AND PELVIS WITH CONTRAST TECHNIQUE: Multidetector CT imaging of the abdomen and pelvis was performed using the standard protocol following bolus administration of intravenous contrast. RADIATION DOSE REDUCTION: This exam was performed according to the departmental dose-optimization program which includes automated exposure control, adjustment of the mA and/or kV according to patient size and/or use of iterative reconstruction technique. CONTRAST:  OMNIPAQUE IOHEXOL 300 MG/ML  SOLN COMPARISON:  None Available. FINDINGS: Lower Chest: No acute findings. Hepatobiliary: A few tiny hepatic cysts are seen, however, there are multiple hypovascular masses involving the right and left hepatic lobes, largest in the central right hepatic lobe measuring 1.9 x 1.5 cm on image 26/2. These are consistent with liver metastases. Gallbladder is unremarkable. No evidence of biliary ductal dilatation. Pancreas:  No mass or inflammatory changes. Spleen: Within normal limits in size and appearance. Adrenals/Urinary Tract: No suspicious masses identified. No evidence of ureteral calculi or hydronephrosis. Stomach/Bowel: Distal small bowel obstruction, with transition point in the right lower quadrant, likely due to peritoneal carcinoma. Rectal intussusception incidentally noted. Vascular/Lymphatic: Mild retroperitoneal lymphadenopathy in the left para-aortic and aortocaval spaces. No acute vascular findings. Reproductive: A 1.6 cm peripherally calcified fibroid is seen in the uterine fundus. Poorly defined masslike soft tissue prominence is seen in the region of the cervix and lower uterine segment measuring 3.6 x 3.3 cm on image 63/2, possibly representing a primary cervical or endometrial carcinoma. Poorly defined soft tissue density is seen in both adnexal regions and along the uterine fundus, consistent with peritoneal carcinomatosis. Other: Mild  ascites is seen. Peritoneal and omental soft tissue nodules and masses are seen throughout the abdomen and pelvis, consistent with peritoneal carcinomatosis. Largest conglomerate mass is seen in the right abdomen measuring approximately 6.7 x 3.4 cm on image 37/2. Musculoskeletal:  No suspicious bone lesions identified. IMPRESSION: Diffuse peritoneal carcinomatosis and mild ascites. Distal small bowel obstruction, with transition point in the right lower quadrant, likely due to peritoneal carcinomatosis. Masslike soft tissue prominence in the region of the lower uterine segment and cervix, possibly representing primary cervical or endometrial carcinoma. Liver metastases. Mild retroperitoneal lymphadenopathy, consistent with metastatic disease. Rectal intussusception incidentally noted. No definite lead mass identified. Electronically Signed   By: Danae Orleans M.D.   On: 02/04/2024 11:34    Labs:  CBC: Recent Labs    02/21/24 0323 02/22/24 0545 02/23/24 0636 02/24/24 0601  WBC 12.6* 12.7* 12.8* 10.8*  HGB 9.5* 8.8* 9.0* 8.7*  HCT 29.6* 28.0* 27.6* 26.7*  PLT 247 222 223 204    COAGS: No results for input(s): "INR", "APTT" in the last 8760 hours.  BMP: Recent Labs    02/21/24 0323 02/22/24 0545 02/23/24 0636 02/24/24 0601  NA 136 137 133* 129*  K 4.4 4.2 4.5 4.8  CL 103 104 101 96*  CO2 25 23 23 25   GLUCOSE 109* 107* 101* 128*  BUN 38* 43* 34* 31*  CALCIUM 8.5* 8.5* 8.3* 7.9*  CREATININE 0.83 0.93 0.81 0.94  GFRNONAA >60 60* >60 59*    LIVER FUNCTION TESTS: Recent Labs    02/17/24 0552 02/19/24 0817 02/21/24 0323 02/24/24 0601  BILITOT 0.4 0.3 0.4 0.5  AST 62* 37 30 21  ALT 110* 79* 59* 37  ALKPHOS 184* 246* 231* 176*  PROT 5.3* 6.0* 5.6* 5.5*  ALBUMIN 2.5* 2.6* 2.5* 2.4*    TUMOR MARKERS: No results for input(s): "AFPTM", "CEA", "CA199", "CHROMGRNA" in the last 8760 hours.  Assessment and Plan: 89 y.o. 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 Pt also has hx PE/LLE DVT on IV heparin, and partial malignant SBO with recent NG tube placement and improvement in abd distension /N/V, since removed. Latest CT A/P on 02/22/24 revealed:  1. Persistent distal colonic intussusception with the sigmoid colon invaginated into the rectum. Appearance is similar to the previous CT from 02/04/2024 and may relate to underlying peritoneal tumor. 2. Mild improvement in distal partial small bowel obstruction with enteric contrast passing into the mid colon. The colon remains decompressed. 3. Persistent ill-defined cervical and bilateral adnexal masses consistent with known ovarian cancer. Associated peritoneal carcinomatosis may be minimally improved. No definite progressive disease. 4. Persistent hepatic metastases, one of which may be slightly smaller in the interval. 5. Small right-greater-than-left pleural effusions with dependent atelectasis at both lung bases, new from 02/04/2024 and increased from 02/11/2024. 6.  Aortic Atherosclerosis  Pt known to IR team from paracentesis on 02/05/24.    Pt does not want any surgical procedures. Request now received from gyn/onc for consideration of venting gastrostomy tube placement. She is afebrile, WBC 10.8, hgb 8.7, plts 204k, creat 0.94, t bili nl, PT/INR pend.Latest imaging studies were reviewed by Dr. Loreta Ave. Risks and benefits of image guided venting gastrostomy tube placement were discussed with the patient by Dr. Loreta Ave including, but not limited to the need for a barium enema during the procedure,  bleeding, infection, leakage of ascites from G tube insertion site, peritonitis and/or damage to adjacent structures.  All of the patient's questions were answered; pt planning to discuss procedure with family before making final decision whether to proceed  with tube placement; Dr. Kenna Gilbert contact information given to pt should family have any additional questions.      Thank you for allowing our service to participate in JOZEY JANCO 's care.  Electronically Signed: D. Jeananne Rama, PA-C   02/24/2024, 1:28 PM      I spent a total of 40 Minutes    in face to face in clinical consultation, greater than 50% of which was counseling/coordinating care for consideration of venting gastrostomy tube placement

## 2024-02-24 NOTE — Progress Notes (Signed)
 Subjective/Chief Complaint: Complains of bloating   Objective: Vital signs in last 24 hours: Temp:  [97.6 F (36.4 C)-98.5 F (36.9 C)] 98.2 F (36.8 C) (02/27 0414) Pulse Rate:  [66-74] 70 (02/27 0414) Resp:  [15-18] 18 (02/27 0414) BP: (108-117)/(55-62) 108/55 (02/27 0414) SpO2:  [92 %-96 %] 96 % (02/27 0414) Last BM Date : 02/23/24  Intake/Output from previous day: 02/26 0701 - 02/27 0700 In: 1027.1 [I.V.:1027.1] Out: -  Intake/Output this shift: No intake/output data recorded.  General appearance: alert and cooperative Resp: clear to auscultation bilaterally Cardio: regular rate and rhythm GI: soft, distended  Lab Results:  Recent Labs    02/23/24 0636 02/24/24 0601  WBC 12.8* 10.8*  HGB 9.0* 8.7*  HCT 27.6* 26.7*  PLT 223 204   BMET Recent Labs    02/23/24 0636 02/24/24 0601  NA 133* 129*  K 4.5 4.8  CL 101 96*  CO2 23 25  GLUCOSE 101* 128*  BUN 34* 31*  CREATININE 0.81 0.94  CALCIUM 8.3* 7.9*   PT/INR No results for input(s): "LABPROT", "INR" in the last 72 hours. ABG No results for input(s): "PHART", "HCO3" in the last 72 hours.  Invalid input(s): "PCO2", "PO2"  Studies/Results: CT ABDOMEN PELVIS W CONTRAST Result Date: 02/22/2024 CLINICAL DATA:  Ovarian cancer, bowel obstruction, restaging. * Tracking Code: BO * EXAM: CT ABDOMEN AND PELVIS WITH CONTRAST TECHNIQUE: Multidetector CT imaging of the abdomen and pelvis was performed using the standard protocol following bolus administration of intravenous contrast. RADIATION DOSE REDUCTION: This exam was performed according to the departmental dose-optimization program which includes automated exposure control, adjustment of the mA and/or kV according to patient size and/or use of iterative reconstruction technique. CONTRAST:  OMNIPAQUE IOHEXOL 300 MG/ML  SOLN COMPARISON:  Abdominopelvic CT 02/04/2024, pelvic MRI 02/11/2024 and chest CT 02/11/2024. FINDINGS: Lower chest: Small  right-greater-than-left pleural effusions with dependent atelectasis at both lung bases, new from 02/04/2024 and increased from 02/11/2024. Contrast filled and mildly distended distal esophagus. Hepatobiliary: Multiple hypoenhancing hepatic metastases are again noted. The largest lesion superior to the gallbladder is less well-defined on the current study and may be slightly smaller, measuring 1.4 cm on image 26/2 (previously 1.9 x 1.5 cm). Other lesions are grossly stable, including a lesion more superiorly in the central right lobe measuring 1.4 cm on image 18/2. Small hepatic cysts are also noted. No evidence of gallstones, gallbladder wall thickening or biliary dilatation. Pancreas: Unremarkable. No pancreatic ductal dilatation or surrounding inflammatory changes. Spleen: Stable 8 mm low-density splenic lesion on image 24/2, likely a cyst. The spleen is normal in size without other focal abnormality. Adrenals/Urinary Tract: Both adrenal glands appear normal. No evidence of urinary tract calculus, suspicious renal lesion or hydronephrosis. The right renal pelvis is mildly dilated without secondary signs of ureteral obstruction. There are renal sinus cysts bilaterally. The bladder appears unremarkable for its degree of distention. Stomach/Bowel: Enteric contrast has passed into the mid colon. The stomach remains mildly distended. Small bowel distension has mildly improved. The colon remains decompressed. There is a persistent distal colonic intussusception with the sigmoid colon invaginated into the rectum. Appearance is similar to the previous CT from 02/04/2024. No evidence of bowel perforation. Vascular/Lymphatic: Small retroperitoneal and mesenteric lymph nodes appear unchanged. Mild aortic and branch vessel atherosclerosis without evidence of aneurysm or large vessel occlusion. The portal, superior mesenteric and splenic veins remain patent. Reproductive: Persistent ill-defined mass involving the cervix,  measuring approximately 3.4 x 4.0 cm on image 62/2. There  is mild distension of the endometrial cavity. A small peripherally calcified uterine fundal fibroid is noted. Persistent ill-defined adnexal masses bilaterally, measuring approximately 4.0 x 2.6 cm on the right (image 54/2) and 2.9 x 1.9 cm on the left (image 57/2). Other: Small to moderate volume of ascites, similar to previous CT. Diffuse omental nodularity may be minimally improved in the interval with a dominant component in the right mid abdomen measuring 3.8 x 2.3 cm on image 39/2. No extravasated enteric contrast or pneumoperitoneum. Musculoskeletal: No acute or significant osseous findings. Mild lumbar spondylosis. IMPRESSION: 1. Persistent distal colonic intussusception with the sigmoid colon invaginated into the rectum. Appearance is similar to the previous CT from 02/04/2024 and may relate to underlying peritoneal tumor. 2. Mild improvement in distal partial small bowel obstruction with enteric contrast passing into the mid colon. The colon remains decompressed. 3. Persistent ill-defined cervical and bilateral adnexal masses consistent with known ovarian cancer. Associated peritoneal carcinomatosis may be minimally improved. No definite progressive disease. 4. Persistent hepatic metastases, one of which may be slightly smaller in the interval. 5. Small right-greater-than-left pleural effusions with dependent atelectasis at both lung bases, new from 02/04/2024 and increased from 02/11/2024. 6.  Aortic Atherosclerosis (ICD10-I70.0). Electronically Signed   By: Carey Bullocks M.D.   On: 02/22/2024 16:08    Anti-infectives: Anti-infectives (From admission, onward)    None       Assessment/Plan: s/p * No surgery found * Plan for IR to place gastrostomy tube Continue chemo Palliative care We will be available if needed  LOS: 20 days    Tracy Preston 02/24/2024

## 2024-02-24 NOTE — Progress Notes (Addendum)
 Tracy Preston   DOB:February 17, 1935   WJ#:[id number]      ASSESSMENT & PLAN:  1.  Diffuse peritoneal carcinomatosis with mild ascites Likely primary cervical, ovarian or endometrial carcinoma with liver mets and bilateral adnexal masses. - Newly diagnosed February 2025 - CT scan 02/04/2024 shows diffuse peritoneal carcinomatosis and mild ascites. - Pelvic ultrasound 02/05/2024 shows 2 cm heterogeneous area at the uterine fundus, endometrial malignancy not excluded. - S/p paracentesis 02/05/2024, 100 mL of yellow fluid removed. 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 -MRI pelvis and CT chest for staging done on 02/11/2024.  Shows abnormal soft tissue throughout the lower uterine and cervix could represent primary uterine/cervical carcinoma or mets. - S/p chemotherapy with carboplatin/Taxol initiated on 02/12/2024.  Tolerated well. - S/p Granix 300 daily x 5 completed 02/17/2024.  - GYN Onc following closely.  - Palliative team following - Medical oncology/Dr. Truett Perna following closely.   2.  Small bowel obstruction - Distal SBO with transition point in the RLQ   - Likely due to peritoneal carcinomatosis - NGT discontinued on 02/19/2024 - TPN ongoing although patient is on clear liquids.  Reports unable to take more than small sips. - S/p CT a/p 02/22/24.  Showed persistent distal colonic intussusception, similar to CT of 02/04/2024 and may relate to underlying peritoneal tumor.  There was mild improvement in SBO. - IR eval for venting G-tube - Management per surgery   3.  Acute PE DVT, LLE - Per CT of the chest for staging purposes done 02/11/2024.  Showed incidental acute PE involving the distal right pulmonary artery and lobar and segmental RUL and RML pulmonary artery branches.  Small right pleural effusion. - On IV heparin, continue per protocol.  Will consider switching to Eliquis when patient tolerating oral  better.  - Monitor closely for active bleeding   4.  Anemia, normocytic - HGB decreasing 8.7 today     - Transfuse PRBC for hemoglobin <7.0. - No transfusional intervention warranted at this time. -Monitor CBC with differential every 2 days    5.  Nausea/vomiting - Continues on and off.  - Administer antiemetics Compazine as needed and Zofran ordered   6.  Hypertension - BP stable  - Monitor BP counts - Primary team following        Code Status DNR-Limited  Subjective:  Patient seen awake and alert, actually looks good today as she has her grandson from [place] at bedside.  States she feels pretty good, hopes she could eat a little more of the clear liquids.  Abdominal distention unchanged.  No other acute complaints offered.  Objective:  Vitals:   02/23/24 2110 02/24/24 0414  BP: 117/61 (!) 108/55  Pulse: 66 70  Resp: 15 18  Temp: 97.6 F (36.4 C) 98.2 F (36.8 C)  SpO2: 92% 96%     Intake/Output Summary (Last 24 hours) at 02/24/2024 6213 Last data filed at 02/23/2024 1813 Gross per 24 hour  Intake 1027.08 ml  Output --  Net 1027.08 ml     REVIEW OF SYSTEMS:   Constitutional: Denies fevers, chills or abnormal night sweats Eyes: Denies blurriness of vision, double vision or watery eyes Ears, nose, mouth, throat, and face: Denies mucositis or sore throat Respiratory: Denies cough, dyspnea or wheezes Cardiovascular: Denies palpitation, chest discomfort or lower extremity swelling Gastrointestinal: + Abdominal distention  skin: Denies abnormal skin rashes Lymphatics: Denies new lymphadenopathy or easy bruising Neurological: Denies numbness, tingling or new  weaknesses Behavioral/Psych: Mood is stable, no new changes  All other systems were reviewed with the patient and are negative.  PHYSICAL EXAMINATION: ECOG PERFORMANCE STATUS: 2 - Symptomatic, <50% confined to bed  Vitals:   02/23/24 2110 02/24/24 0414  BP: 117/61 (!) 108/55  Pulse: 66 70  Resp: 15  18  Temp: 97.6 F (36.4 C) 98.2 F (36.8 C)  SpO2: 92% 96%   Filed Weights   02/18/24 0715 02/19/24 0713 02/21/24 0500  Weight: 126 lb 12.2 oz (57.5 kg) 123 lb 10.9 oz (56.1 kg) 126 lb 1.7 oz (57.2 kg)    GENERAL: alert, no distress and comfortable SKIN: skin color, texture, turgor are normal, no rashes or significant lesions EYES: normal, conjunctiva are pink and non-injected, sclera clear OROPHARYNX: no exudate, no erythema and lips, buccal mucosa, and tongue normal  NECK: supple, thyroid normal size, non-tender, without nodularity LYMPH: no palpable lymphadenopathy in the cervical, axillary or inguinal LUNGS: clear to auscultation and percussion with normal breathing effort HEART: regular rate & rhythm and no murmurs and no lower extremity edema ABDOMEN: + Abdominal distention + abdominal tightness MUSCULOSKELETAL: no cyanosis of digits and no clubbing  PSYCH: alert & oriented x 3 with fluent speech NEURO: no focal motor/sensory deficits   All questions were answered. The patient knows to call the clinic with any problems, questions or concerns.   The total time spent in the appointment was 40 minutes encounter with patient including review of chart and various tests results, discussions about plan of care and coordination of care plan  Dawson Bills, NP 02/24/2024 9:42 AM    Labs Reviewed:  Lab Results  Component Value Date   WBC 10.8 (H) 02/24/2024   HGB 8.7 (L) 02/24/2024   HCT 26.7 (L) 02/24/2024   MCV 95.4 02/24/2024   PLT 204 02/24/2024   Recent Labs    02/19/24 0817 02/21/24 0323 02/22/24 0545 02/23/24 0636 02/24/24 0601  NA 137 136 137 133* 129*  K 4.4 4.4 4.2 4.5 4.8  CL 104 103 104 101 96*  CO2 25 25 23 23 25   GLUCOSE 103* 109* 107* 101* 128*  BUN 31* 38* 43* 34* 31*  CREATININE 0.79 0.83 0.93 0.81 0.94  CALCIUM 8.6* 8.5* 8.5* 8.3* 7.9*  GFRNONAA >60 >60 60* >60 59*  PROT 6.0* 5.6*  --   --  5.5*  ALBUMIN 2.6* 2.5*  --   --  2.4*  AST 37 30  --    --  21  ALT 79* 59*  --   --  37  ALKPHOS 246* 231*  --   --  176*  BILITOT 0.3 0.4  --   --  0.5    Studies Reviewed:  CT ABDOMEN PELVIS W CONTRAST Result Date: 02/22/2024 CLINICAL DATA:  Ovarian cancer, bowel obstruction, restaging. * Tracking Code: BO * EXAM: CT ABDOMEN AND PELVIS WITH CONTRAST TECHNIQUE: Multidetector CT imaging of the abdomen and pelvis was performed using the standard protocol following bolus administration of intravenous contrast. RADIATION DOSE REDUCTION: This exam was performed according to the departmental dose-optimization program which includes automated exposure control, adjustment of the mA and/or kV according to patient size and/or use of iterative reconstruction technique. CONTRAST:  OMNIPAQUE IOHEXOL 300 MG/ML  SOLN COMPARISON:  Abdominopelvic CT 02/04/2024, pelvic MRI 02/11/2024 and chest CT 02/11/2024. FINDINGS: Lower chest: Small right-greater-than-left pleural effusions with dependent atelectasis at both lung bases, new from 02/04/2024 and increased from 02/11/2024. Contrast filled and mildly distended distal esophagus.  Hepatobiliary: Multiple hypoenhancing hepatic metastases are again noted. The largest lesion superior to the gallbladder is less well-defined on the current study and may be slightly smaller, measuring 1.4 cm on image 26/2 (previously 1.9 x 1.5 cm). Other lesions are grossly stable, including a lesion more superiorly in the central right lobe measuring 1.4 cm on image 18/2. Small hepatic cysts are also noted. No evidence of gallstones, gallbladder wall thickening or biliary dilatation. Pancreas: Unremarkable. No pancreatic ductal dilatation or surrounding inflammatory changes. Spleen: Stable 8 mm low-density splenic lesion on image 24/2, likely a cyst. The spleen is normal in size without other focal abnormality. Adrenals/Urinary Tract: Both adrenal glands appear normal. No evidence of urinary tract calculus, suspicious renal lesion or  hydronephrosis. The right renal pelvis is mildly dilated without secondary signs of ureteral obstruction. There are renal sinus cysts bilaterally. The bladder appears unremarkable for its degree of distention. Stomach/Bowel: Enteric contrast has passed into the mid colon. The stomach remains mildly distended. Small bowel distension has mildly improved. The colon remains decompressed. There is a persistent distal colonic intussusception with the sigmoid colon invaginated into the rectum. Appearance is similar to the previous CT from 02/04/2024. No evidence of bowel perforation. Vascular/Lymphatic: Small retroperitoneal and mesenteric lymph nodes appear unchanged. Mild aortic and branch vessel atherosclerosis without evidence of aneurysm or large vessel occlusion. The portal, superior mesenteric and splenic veins remain patent. Reproductive: Persistent ill-defined mass involving the cervix, measuring approximately 3.4 x 4.0 cm on image 62/2. There is mild distension of the endometrial cavity. A small peripherally calcified uterine fundal fibroid is noted. Persistent ill-defined adnexal masses bilaterally, measuring approximately 4.0 x 2.6 cm on the right (image 54/2) and 2.9 x 1.9 cm on the left (image 57/2). Other: Small to moderate volume of ascites, similar to previous CT. Diffuse omental nodularity may be minimally improved in the interval with a dominant component in the right mid abdomen measuring 3.8 x 2.3 cm on image 39/2. No extravasated enteric contrast or pneumoperitoneum. Musculoskeletal: No acute or significant osseous findings. Mild lumbar spondylosis. IMPRESSION: 1. Persistent distal colonic intussusception with the sigmoid colon invaginated into the rectum. Appearance is similar to the previous CT from 02/04/2024 and may relate to underlying peritoneal tumor. 2. Mild improvement in distal partial small bowel obstruction with enteric contrast passing into the mid colon. The colon remains decompressed.  3. Persistent ill-defined cervical and bilateral adnexal masses consistent with known ovarian cancer. Associated peritoneal carcinomatosis may be minimally improved. No definite progressive disease. 4. Persistent hepatic metastases, one of which may be slightly smaller in the interval. 5. Small right-greater-than-left pleural effusions with dependent atelectasis at both lung bases, new from 02/04/2024 and increased from 02/11/2024. 6.  Aortic Atherosclerosis (ICD10-I70.0). Electronically Signed   By: Carey Bullocks M.D.   On: 02/22/2024 16:08   DG Abd Portable 1V-Small Bowel Obstruction Protocol-initial, 8 hr delay Result Date: 02/18/2024 CLINICAL DATA:  8 hour delay small bowel obstruction EXAM: PORTABLE ABDOMEN - 1 VIEW COMPARISON:  Abdominal x-ray 02/18/2024 FINDINGS: Enteric tube tip is in the fundus of the stomach with sidehole at the level of the gastroesophageal junction. Contrast is seen throughout nondilated colon to the level of the rectum. No dilated bowel loops are seen. IMPRESSION: 1. Enteric tube tip is in the fundus of the stomach with sidehole at the level of the gastroesophageal junction. Recommend advancement 8 cm. 2. Contrast is seen throughout nondilated colon to the level of the rectum. No evidence for bowel obstruction. Electronically Signed  By: Darliss Cheney M.D.   On: 02/18/2024 20:57   DG Abd Portable 1V-Small Bowel Protocol-Position Verification Result Date: 02/18/2024 CLINICAL DATA:  [id number] Encounter for imaging study to confirm nasogastric (NG) tube placement [id number] EXAM: PORTABLE ABDOMEN - 1 VIEW COMPARISON:  02/10/2024 abdominal radiograph FINDINGS: Enteric tube terminates in the proximal stomach. No dilated small bowel loops. No evidence of pneumatosis or pneumoperitoneum. Mild elevation of the left hemidiaphragm with clear lung bases. IMPRESSION: Enteric tube terminates in the proximal stomach. Electronically Signed   By: Delbert Phenix M.D.   On: 02/18/2024 13:42    ECHOCARDIOGRAM COMPLETE Result Date: 02/13/2024    ECHOCARDIOGRAM REPORT   Patient Name:   AVIANA SHEVLIN Date of Exam: 02/13/2024 Medical Rec #:  [id number]          Height:       64.0 in Accession #:    [id number]         Weight:       118.9 lb Date of Birth:  1935/02/05          BSA:          1.568 m Patient Age:    88 years           BP:           142/72 mmHg Patient Gender: F                  HR:           71 bpm. Exam Location:  Inpatient Procedure: 2D Echo, Cardiac Doppler and Color Doppler (Both Spectral and Color            Flow Doppler were utilized during procedure). Indications:    Cardiomyopathy- Unspecified I42.9  History:        Patient has no prior history of Echocardiogram examinations.                 Risk Factors:Hypertension.  Sonographer:    Lucendia Herrlich RCS Referring Phys: [id number] Doreen Salvage AMIN IMPRESSIONS  1. Left ventricular ejection fraction, by estimation, is 65 to 70%. The left ventricle has normal function. The left ventricle has no regional wall motion abnormalities. Left ventricular diastolic parameters were normal.  2. Right ventricular systolic function is normal. The right ventricular size is normal. There is normal pulmonary artery systolic pressure. The estimated right ventricular systolic pressure is 23.8 mmHg.  3. The mitral valve is grossly normal. No evidence of mitral valve regurgitation. No evidence of mitral stenosis.  4. The aortic valve is calcified. Aortic valve regurgitation is trivial. Aortic valve sclerosis/calcification is present, without any evidence of aortic stenosis.  5. The inferior vena cava is normal in size with greater than 50% respiratory variability, suggesting right atrial pressure of 3 mmHg. FINDINGS  Left Ventricle: Left ventricular ejection fraction, by estimation, is 65 to 70%. The left ventricle has normal function. The left ventricle has no regional wall motion abnormalities. Strain imaging was not performed. The left ventricular internal  cavity  size was normal in size. There is no left ventricular hypertrophy. Left ventricular diastolic parameters were normal. Right Ventricle: The right ventricular size is normal. No increase in right ventricular wall thickness. Right ventricular systolic function is normal. There is normal pulmonary artery systolic pressure. The tricuspid regurgitant velocity is 2.28 m/s, and  with an assumed right atrial pressure of 3 mmHg, the estimated right ventricular systolic pressure is 23.8 mmHg. Left Atrium: Left atrial size was normal in size.  Right Atrium: Right atrial size was normal in size. Pericardium: There is no evidence of pericardial effusion. Mitral Valve: The mitral valve is grossly normal. No evidence of mitral valve regurgitation. No evidence of mitral valve stenosis. Tricuspid Valve: The tricuspid valve is grossly normal. Tricuspid valve regurgitation is trivial. No evidence of tricuspid stenosis. Aortic Valve: The aortic valve is calcified. Aortic valve regurgitation is trivial. Aortic regurgitation PHT measures 299 msec. Aortic valve sclerosis/calcification is present, without any evidence of aortic stenosis. Aortic valve mean gradient measures 9.0 mmHg. Aortic valve peak gradient measures 11.1 mmHg. Aortic valve area, by VTI measures 1.23 cm. Pulmonic Valve: The pulmonic valve was grossly normal. Pulmonic valve regurgitation is trivial. No evidence of pulmonic stenosis. Aorta: The aortic root and ascending aorta are structurally normal, with no evidence of dilitation. Venous: The inferior vena cava is normal in size with greater than 50% respiratory variability, suggesting right atrial pressure of 3 mmHg. IAS/Shunts: The atrial septum is grossly normal. Additional Comments: 3D imaging was not performed. Mild ascites is present.  LEFT VENTRICLE PLAX 2D LVIDd:         4.00 cm   Diastology LVIDs:         2.60 cm   LV e' medial:    9.25 cm/s LV PW:         0.90 cm   LV E/e' medial:  6.4 LV IVS:        1.00  cm   LV e' lateral:   12.20 cm/s LVOT diam:     1.80 cm   LV E/e' lateral: 4.9 LV SV:         48 LV SV Index:   31 LVOT Area:     2.54 cm  RIGHT VENTRICLE             IVC RV S prime:     11.00 cm/s  IVC diam: 1.20 cm TAPSE (M-mode): 1.8 cm LEFT ATRIUM             Index        RIGHT ATRIUM           Index LA diam:        3.70 cm 2.36 cm/m   RA Area:     12.50 cm LA Vol (A2C):   29.1 ml 18.55 ml/m  RA Volume:   29.10 ml  18.55 ml/m LA Vol (A4C):   40.0 ml 25.50 ml/m LA Biplane Vol: 34.3 ml 21.87 ml/m  AORTIC VALVE                     PULMONIC VALVE AV Area (Vmax):    1.61 cm      PR End Diast Vel: 2.12 msec AV Area (Vmean):   1.25 cm AV Area (VTI):     1.23 cm AV Vmax:           166.67 cm/s AV Vmean:          138.000 cm/s AV VTI:            0.392 m AV Peak Grad:      11.1 mmHg AV Mean Grad:      9.0 mmHg LVOT Vmax:         105.50 cm/s LVOT Vmean:        67.950 cm/s LVOT VTI:          0.190 m LVOT/AV VTI ratio: 0.48 AI PHT:  299 msec  AORTA Ao Root diam: 3.10 cm Ao Asc diam:  2.80 cm MITRAL VALVE               TRICUSPID VALVE MV Area (PHT): 3.27 cm    TR Peak grad:   20.8 mmHg MV Decel Time: 232 msec    TR Vmax:        228.00 cm/s MV E velocity: 59.60 cm/s MV A velocity: 76.60 cm/s  SHUNTS MV E/A ratio:  0.78        Systemic VTI:  0.19 m                            Systemic Diam: 1.80 cm Lennie Odor MD Electronically signed by Lennie Odor MD Signature Date/Time: 02/13/2024/8:53:15 PM    Final    VAS [place] LOWER EXTREMITY VENOUS (DVT) Result Date: 02/12/2024  Lower Venous DVT Study Patient Name:  JAIRY ANGULO  Date of Exam:   02/12/2024 Medical Rec #: [id number]           Accession #:    [id number] Date of Birth: 02-02-1935           Patient Gender: F Patient Age:   89 years Exam Location:  [facility] Procedure:      VAS [place] LOWER EXTREMITY VENOUS (DVT) Referring Phys: Stephania Fragmin --------------------------------------------------------------------------------  Indications: Pulmonary  embolism.  Risk Factors: Newly diagnosed peritoneal carcinomatosis. Comparison Study: No previous exams Performing Technologist: Jody Hill RVT, RDMS  Examination Guidelines: A complete evaluation includes B-mode imaging, spectral Doppler, color Doppler, and power Doppler as needed of all accessible portions of each vessel. Bilateral testing is considered an integral part of a complete examination. Limited examinations for reoccurring indications may be performed as noted. The reflux portion of the exam is performed with the patient in reverse Trendelenburg.  +---------+---------------+---------+-----------+----------+--------------+ RIGHT    CompressibilityPhasicitySpontaneityPropertiesThrombus Aging +---------+---------------+---------+-----------+----------+--------------+ CFV      Full           Yes      No                                  +---------+---------------+---------+-----------+----------+--------------+ SFJ      Full                                                        +---------+---------------+---------+-----------+----------+--------------+ FV Prox  Full           Yes      Yes                                 +---------+---------------+---------+-----------+----------+--------------+ FV Mid   Full           Yes      Yes                                 +---------+---------------+---------+-----------+----------+--------------+ FV DistalFull           Yes      Yes                                 +---------+---------------+---------+-----------+----------+--------------+  PFV      Full                                                        +---------+---------------+---------+-----------+----------+--------------+ POP      Full           Yes      Yes                                 +---------+---------------+---------+-----------+----------+--------------+ PTV      Full                                                         +---------+---------------+---------+-----------+----------+--------------+ PERO     Full                                                        +---------+---------------+---------+-----------+----------+--------------+   +---------+---------------+---------+-----------+----------+--------------+ LEFT     CompressibilityPhasicitySpontaneityPropertiesThrombus Aging +---------+---------------+---------+-----------+----------+--------------+ CFV      Full           Yes      Yes                                 +---------+---------------+---------+-----------+----------+--------------+ SFJ      Full                                                        +---------+---------------+---------+-----------+----------+--------------+ FV Prox  Full           Yes      Yes                                 +---------+---------------+---------+-----------+----------+--------------+ FV Mid   Full           Yes      Yes                                 +---------+---------------+---------+-----------+----------+--------------+ FV DistalFull           Yes      Yes                                 +---------+---------------+---------+-----------+----------+--------------+ PFV      Full                                                        +---------+---------------+---------+-----------+----------+--------------+  POP      Full           Yes      Yes                                 +---------+---------------+---------+-----------+----------+--------------+ PTV      None           No       No                   Acute          +---------+---------------+---------+-----------+----------+--------------+ PERO     None           No       No                   Acute          +---------+---------------+---------+-----------+----------+--------------+     Summary: BILATERAL: -No evidence of popliteal cyst, bilaterally. RIGHT: - There is no evidence of deep vein thrombosis  in the lower extremity.  LEFT: - Findings consistent with acute deep vein thrombosis involving the left peroneal veins, and left posterior tibial veins.   *See table(s) above for measurements and observations. Electronically signed by Carolynn Sayers on 02/12/2024 at 8:18:12 PM.    Final    CT CHEST W CONTRAST Addendum Date: 02/11/2024 ADDENDUM REPORT: 02/11/2024 15:54 ADDENDUM: Critical Value/emergent results were called by telephone at the time of interpretation on 02/11/2024 at 3:54 pm to provider Eugene Garnet, MD, who verbally acknowledged these results. Electronically Signed   By: Delbert Phenix M.D.   On: 02/11/2024 15:54   Result Date: 02/11/2024 CLINICAL DATA:  Peritoneal carcinomatosis. Chest staging. * Tracking Code: BO * EXAM: CT CHEST WITH CONTRAST TECHNIQUE: Multidetector CT imaging of the chest was performed during intravenous contrast administration. RADIATION DOSE REDUCTION: This exam was performed according to the departmental dose-optimization program which includes automated exposure control, adjustment of the mA and/or kV according to patient size and/or use of iterative reconstruction technique. CONTRAST:  75mL OMNIPAQUE IOHEXOL 300 MG/ML  SOLN COMPARISON:  02/04/2024 CT abdomen/pelvis. 02/08/2024 chest radiograph. FINDINGS: Cardiovascular: Normal heart size. No significant pericardial effusion/thickening. Left anterior descending coronary atherosclerosis. Right PICC terminates in the middle third of the SVC. Atherosclerotic nonaneurysmal thoracic aorta. Incidental acute pulmonary embolism involving the distal right pulmonary artery and lobar and segmental right upper lobe and right middle lobe pulmonary artery branches. No saddle embolus. Normal caliber main pulmonary artery. RV/LV ratio 1.0. Mediastinum/Nodes: No significant thyroid nodules. Fluid level in the mid to lower thoracic esophagus with enteric tube terminating in the proximal stomach. No right axillary adenopathy. Mild left  axillary adenopathy up to 1.1 cm (series 2/image 61). No pathologically enlarged mediastinal or hilar nodes. Lungs/Pleura: No pneumothorax. Small layering right pleural effusion. No left pleural effusion. Calcified 1 cm posterior right lower lobe granuloma. Mild dependent bilateral lower lobe atelectasis. No acute consolidative airspace disease, lung masses or significant pulmonary nodules. Upper abdomen: Moderate upper abdominal ascites with nodular soft tissue caking in the visualized anterior left upper peritoneum, as seen on recent CT abdomen study. Several hypodense liver lesions scattered throughout the visualized liver, unchanged, largest 1.6 cm in the inferior liver on series 2/image 155. Musculoskeletal: No aggressive appearing focal osseous lesions. Mild thoracic spondylosis. IMPRESSION: 1. Incidental acute pulmonary embolism involving the distal right pulmonary artery and lobar and segmental right upper lobe and right middle lobe  pulmonary artery branches. No saddle embolus. Normal caliber main pulmonary artery. Borderline elevated RV/LV Ratio = 1.0. 2. Small layering right pleural effusion. Mild dependent bilateral lower lobe atelectasis. 3. Mild left axillary adenopathy, cannot exclude metastatic disease. No other evidence of metastatic disease in the chest. 4. One vessel coronary atherosclerosis. 5. Moderate upper abdominal ascites with nodular soft tissue caking in the visualized anterior left upper peritoneum, as seen on recent CT abdomen study, compatible with peritoneal carcinomatosis. Several hypodense liver lesions scattered throughout the visualized liver, unchanged, suspicious for liver metastases. Electronically Signed: By: Delbert Phenix M.D. On: 02/11/2024 15:28   MR PELVIS W WO CONTRAST Result Date: 02/11/2024 CLINICAL DATA:  "Occult malignancy". Peritoneal carcinomatosis on abdominopelvic CT with bowel obstruction. EXAM: MRI PELVIS WITHOUT AND WITH CONTRAST TECHNIQUE: Multiplanar  multisequence MR imaging of the pelvis was performed both before and after administration of intravenous contrast. CONTRAST:  5mL GADAVIST GADOBUTROL 1 MMOL/ML IV SOLN COMPARISON:  CT of 02/04/2024 FINDINGS: Urinary Tract: Primarily decompressed urinary bladder. Left renal sinus cysts without hydronephrosis. Bowel: Fluid-filled small bowel loops within the upper and mid pelvis measure up to 2.0 cm on 19/17. Improved from 3.4 cm on the prior exam. Again identified is an intussusception of the sigmoid into the rectum. There is mild hyperenhancement within the lead point including on 41/17, without restricted diffusion in this area. Vascular/Lymphatic: Ileocolic mesenteric adenopathy including at 2.2 x 1.8 cm on 08/17. No pelvic sidewall adenopathy. Reproductive: The endometrium is thickened for age including at 1.0 cm on 18/4. This is followed to the level of the cervix and lower uterine segment, where soft tissue fullness is again identified including on 18/4 and 21/3. Correlate restricted diffusion on 22/11. Example 3.7 x 3.7 cm on 31/17. There is amorphous soft tissue fullness within both adnexa, greater right than left. Example restricted diffusion on 17/11. The right adnexal soft tissue fullness measures 3.3 x 3.3 cm on 18/17. No separate ovarian tissue identified. Other: Peritoneal carcinomatosis, as evidenced by diffuse peritoneal enhancement and restricted diffusion (example 22/11). Small volume pelvic fluid. Musculoskeletal: No acute osseous abnormality. IMPRESSION: 1. Abnormal amorphous soft tissue throughout lower uterine segment and cervix could represent primary uterine/cervical carcinoma or metastatic disease. 2. Diffuse peritoneal carcinomatosis as better imaged on dedicated CT. Ileocolic mesenteric nodal metastasis. 3. Right greater than left adnexal soft tissue fullness likely represents ovarian metastasis. 4. Chronic or recurrent sigmoid/rectal intussusception since 02/04/2024. Vague hyperenhancement  in the region of the lead point, without dominant mass. Recommend physical exam correlation to exclude either primary rectosigmoid carcinoma or serosal sigmoid implant lead point. 5. Small volume pelvic fluid. 6. Improved small bowel dilatation since prior CT. Electronically Signed   By: Jeronimo Greaves M.D.   On: 02/11/2024 15:42   DG Abd 1 View Result Date: 02/10/2024 CLINICAL DATA:  16109 Abdominal distention [id number] SBO (small bowel obstruction) (HCC) [id number] EXAM: ABDOMEN - 1 VIEW COMPARISON:  02/08/2024 FINDINGS: NG tube terminates within the proximal stomach. Worsening small bowel dilation in the mid abdomen measuring up to 5.3 cm (previously 3.3 cm. Paucity of bowel gas within the colon. No gross free intraperitoneal air. IMPRESSION: 1. Worsening small bowel obstruction. 2. NG tube terminates within the proximal stomach. Recommend advancement. Electronically Signed   By: Duanne Guess D.O.   On: 02/10/2024 09:35   DG Chest 1 View Result Date: 02/08/2024 CLINICAL DATA:  PICC line placement EXAM: CHEST  1 VIEW COMPARISON:  02/05/2024 FINDINGS: Right PICC line in place with the tip in  the SVC. NG tube is in the stomach. Heart and mediastinal contours within normal limits. No confluent opacities or effusions. No acute bony abnormality. IMPRESSION: Right PICC line tip in the SVC. No active cardiopulmonary disease. Electronically Signed   By: Charlett Nose M.D.   On: 02/08/2024 17:51   [place] EKG SITE RITE Result Date: 02/08/2024 If Site Rite image not attached, placement could not be confirmed due to current cardiac rhythm.  DG Abd Portable 1V Result Date: 02/08/2024 CLINICAL DATA:  Check gastric catheter placement EXAM: PORTABLE ABDOMEN - 1 VIEW COMPARISON:  02/04/2024 FINDINGS: Scattered large and small bowel gas is noted. Mild small bowel dilatation is noted significantly improved when compared with the prior exam. Gastric catheter is noted with the tip in the stomach. No free air is noted.  IMPRESSION: Persistent but significantly improved small bowel dilatation when compared with the prior exam. Electronically Signed   By: Alcide Clever M.D.   On: 02/08/2024 09:34   US Paracentesis Result Date: 02/05/2024 INDICATION: 89 year old with new abdominal distention, ascites. Request made for diagnostic and therapeutic paracentesis. EXAM: ULTRASOUND GUIDED DIAGNOSTIC  PARACENTESIS MEDICATIONS: 10 mL 1% lidocaine. COMPLICATIONS: None immediate. PROCEDURE: Informed written consent was obtained from the patient after a discussion of the risks, benefits and alternatives to treatment. A timeout was performed prior to the initiation of the procedure. Initial ultrasound scanning demonstrates a small amount of ascites within the left lateral abdomen interlooped with bowel. The left lateral abdomen was prepped and draped in the usual sterile fashion. 1% lidocaine was used for local anesthesia. Following this, a 19 gauge, 7-cm, Yueh catheter was introduced. An ultrasound image was saved for documentation purposes. The paracentesis was performed. The catheter was removed and a dressing was applied. The patient tolerated the procedure well without immediate post procedural complication. FINDINGS: A total of approximately 100 mL of yellow fluid was removed. Samples were sent to the laboratory as requested by the clinical team. IMPRESSION: Successful ultrasound-guided paracentesis yielding 100 mL of peritoneal fluid. Performed by: Loyce Dys PA-C Electronically Signed   By: Malachy Moan M.D.   On: 02/05/2024 14:31   US PELVIC COMPLETE WITH TRANSVAGINAL Result Date: 02/05/2024 CLINICAL DATA:  89 year old female with ascites, peritoneal carcinomatosis on CT Abdomen and Pelvis yesterday. EXAM: TRANSABDOMINAL AND TRANSVAGINAL ULTRASOUND OF PELVIS TECHNIQUE: Both transabdominal and transvaginal ultrasound examinations of the pelvis were performed. Transabdominal technique was performed for global imaging of the  pelvis including uterus, ovaries, adnexal regions, and pelvic cul-de-sac. It was necessary to proceed with endovaginal exam following the transabdominal exam to visualize the ovaries. COMPARISON:  CT Abdomen and Pelvis 02/04/2024. FINDINGS: Uterus Measurements: 6.9 x 3.9 x 3.9 cm = volume: 41 mL. No obvious myometrial mass. Endometrium Heterogeneous hypoechoic area at the fundus endometrium (image 49 of series 1). This area encompasses about 2 cm (image 55). See also cine series 3 images. Otherwise the echogenic endometrial stripe is up to 5 mm. Right ovary Measurements: 3.5 x 2.1 x 2.8 cm = volume: 11 mL. No ovarian mass identified. Left ovary Measurements: 2.3 x 1.5 x 1.7 cm = volume: 3 mL. No ovarian mass identified. Other findings Small volume ascites again visible in the pelvis. IMPRESSION: 1. No ovarian mass identified by ultrasound. 2. There is a 2 cm heterogeneous area at the uterine fundus which is favored to be endometrial related. Elsewhere the endometrial stripe is about 5 mm. An Endometrial Malignancy is not excluded. 3. Ascites, in conjunction with the extensive additional abdominal  and pelvic abnormalities demonstrated by CT yesterday. Electronically Signed   By: Odessa Fleming M.D.   On: 02/05/2024 12:17   DG Chest Portable 1 View Result Date: 02/05/2024 CLINICAL DATA:  NG tube replacement confirmation EXAM: PORTABLE CHEST - 1 VIEW COMPARISON:  02/04/2024 FINDINGS: Cardiomediastinal silhouette and pulmonary vasculature are within normal limits. Lungs are clear. Nasogastric tube extends to the left upper quadrant the side hole located at the level of the gastroesophageal junction. It is slightly retracted compared to prior exam. IMPRESSION: Nasogastric tube terminates in the left upper quadrant in the expected site of the stomach. The side hole of the NG tube is located at the level of the gastroesophageal junction. Electronically Signed   By: Acquanetta Belling M.D.   On: 02/05/2024 07:38   DG Chest Portable  1 View Result Date: 02/04/2024 CLINICAL DATA:  NG tube placement. Abdominal discomfort, heartburn, and nausea. EXAM: PORTABLE CHEST 1 VIEW COMPARISON:  None Available. FINDINGS: The heart size and mediastinal contours are within normal limits. There is atherosclerotic calcification of the aorta. No consolidation, effusion, or pneumothorax. An enteric tube terminates in the stomach. No acute osseous abnormality. IMPRESSION: 1. No active disease. 2. Enteric tube terminates in the stomach. Electronically Signed   By: Thornell Sartorius M.D.   On: 02/04/2024 21:20   CT ABDOMEN PELVIS W CONTRAST Result Date: 02/04/2024 CLINICAL DATA:  Abdominal pain, bloating, nausea and vomiting for 2 weeks. * Tracking Code: BO * EXAM: CT ABDOMEN AND PELVIS WITH CONTRAST TECHNIQUE: Multidetector CT imaging of the abdomen and pelvis was performed using the standard protocol following bolus administration of intravenous contrast. RADIATION DOSE REDUCTION: This exam was performed according to the departmental dose-optimization program which includes automated exposure control, adjustment of the mA and/or kV according to patient size and/or use of iterative reconstruction technique. CONTRAST:  OMNIPAQUE IOHEXOL 300 MG/ML  SOLN COMPARISON:  None Available. FINDINGS: Lower Chest: No acute findings. Hepatobiliary: A few tiny hepatic cysts are seen, however, there are multiple hypovascular masses involving the right and left hepatic lobes, largest in the central right hepatic lobe measuring 1.9 x 1.5 cm on image 26/2. These are consistent with liver metastases. Gallbladder is unremarkable. No evidence of biliary ductal dilatation. Pancreas:  No mass or inflammatory changes. Spleen: Within normal limits in size and appearance. Adrenals/Urinary Tract: No suspicious masses identified. No evidence of ureteral calculi or hydronephrosis. Stomach/Bowel: Distal small bowel obstruction, with transition point in the right lower quadrant, likely due to  peritoneal carcinoma. Rectal intussusception incidentally noted. Vascular/Lymphatic: Mild retroperitoneal lymphadenopathy in the left para-aortic and aortocaval spaces. No acute vascular findings. Reproductive: A 1.6 cm peripherally calcified fibroid is seen in the uterine fundus. Poorly defined masslike soft tissue prominence is seen in the region of the cervix and lower uterine segment measuring 3.6 x 3.3 cm on image 63/2, possibly representing a primary cervical or endometrial carcinoma. Poorly defined soft tissue density is seen in both adnexal regions and along the uterine fundus, consistent with peritoneal carcinomatosis. Other: Mild ascites is seen. Peritoneal and omental soft tissue nodules and masses are seen throughout the abdomen and pelvis, consistent with peritoneal carcinomatosis. Largest conglomerate mass is seen in the right abdomen measuring approximately 6.7 x 3.4 cm on image 37/2. Musculoskeletal:  No suspicious bone lesions identified. IMPRESSION: Diffuse peritoneal carcinomatosis and mild ascites. Distal small bowel obstruction, with transition point in the right lower quadrant, likely due to peritoneal carcinomatosis. Masslike soft tissue prominence in the region of the lower uterine  segment and cervix, possibly representing primary cervical or endometrial carcinoma. Liver metastases. Mild retroperitoneal lymphadenopathy, consistent with metastatic disease. Rectal intussusception incidentally noted. No definite lead mass identified. Electronically Signed   By: Danae Orleans M.D.   On: 02/04/2024 11:34    Ms. Fujii was interviewed and examined.  She reports improvement in nausea since beginning octreotide.  She continues to have bowel movements and flatus.  She tolerated some liquid.  The CA125 is stable.  We discussed treatment options.  There has been partial improvement in the bowel obstruction over the past few weeks, but she continues to have significant obstructive symptoms.  The  restaging evaluation is consistent with overall stable disease.  She is agreeable to proceeding with a palliative gastrostomy if needed.  We discussed disposition plans.  She understands she will not be a candidate for further chemotherapy if she enters hospice care.  Recommendations: Continue TPN Heparin until decision on venting gastrostomy Continue octreotide Continue discussions with the patient/family regarding disposition plans

## 2024-02-24 NOTE — Progress Notes (Signed)
 GYN Onc Progress Note  Subjective: Patient states if she didn't have nausea, she would feel pretty good. Antiemetics are helping to control the nausea. No episodes of emesis yesterday afternoon or overnight. Continues to pass flatus and having loose stools. No pain reported. Abdominal distention feels similar to yesterday. Informed IR would be seeing her to discuss potential G tube option. No needs voiced at this time.    Objective: Vital signs in last 24 hours: Temp:  [97.6 F (36.4 C)-98.5 F (36.9 C)] 98.2 F (36.8 C) (02/27 0414) Pulse Rate:  [66-74] 70 (02/27 0414) Resp:  [15-18] 18 (02/27 0414) BP: (108-117)/(55-62) 108/55 (02/27 0414) SpO2:  [92 %-96 %] 96 % (02/27 0414) Last BM Date : 02/23/24  Intake/Output from previous day: 02/26 0701 - 02/27 0700 In: 1027.1 [I.V.:1027.1] Out: -   Physical Examination: Gen: NAD, alert and oriented;  Pulm: Breathing unlabored. Lungs clear.  Heart in regular rate and rhythm Abd: remains distended (similar to yesterday's assessment), active BS, nontender, tympanic Ext: warm and well perfused, no edema or tenderness to palpation  Labs:    Latest Ref Rng & Units 02/24/2024    6:01 AM 02/23/2024    6:36 AM 02/22/2024    5:45 AM  CBC  WBC 4.0 - 10.5 K/uL 10.8  12.8  12.7   Hemoglobin 12.0 - 15.0 g/dL 8.7  9.0  8.8   Hematocrit 36.0 - 46.0 % 26.7  27.6  28.0   Platelets 150 - 400 K/uL 204  223  222       Latest Ref Rng & Units 02/24/2024    6:01 AM 02/23/2024    6:36 AM 02/22/2024    5:45 AM  BMP  Glucose 70 - 99 mg/dL 161  096  045   BUN 8 - 23 mg/dL 31  34  43   Creatinine 0.44 - 1.00 mg/dL 4.09  8.11  9.14   Sodium 135 - 145 mmol/L 129  133  137   Potassium 3.5 - 5.1 mmol/L 4.8  4.5  4.2   Chloride 98 - 111 mmol/L 96  101  104   CO2 22 - 32 mmol/L 25  23  23    Calcium 8.9 - 10.3 mg/dL 7.9  8.3  8.5    Echocardiogram performed 02/13/2024: EF 65-70%  Doppler from 02/12/24 with +DVT in LLE  Small bowel imaging on 02/18/2024. NG  removed 02/19/24. CT AP with contrast on 02/22/2024 with persistent distal colonic intussusception, mild improvement in distal partial small bowel obstruction.  Assessment: 89 y.o. 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 Truett Perna. She has received granix as well. PE diagnosed on staging CT chest from 02/11/2024-currently on heparin drip.   Metastatic cancer, favored to represent ovarian or primary peritoneal cancer rather than endometrial.    Malignant bowel obstruction: CT 02/22/2024. Patient and family would like to discuss risks, expectations of possible of future G-tube placement with IR- consult placed for discussion and IR is aware.  Pulmonary embolism: Dr. Pricilla Holm previously discussed with the patient hypercoagulable state that cancer causes. Doppler from 02/12/2024 +for DVT in LLE. Recommend no use of SCDs.  Patient started on heparin drip 02/11/24.  Goal would be to transition to DOAC once she is able to take p.o. and ready for discharge.   LOS: 20 days    Doylene Bode 02/24/2024, 8:54 AM

## 2024-02-25 ENCOUNTER — Other Ambulatory Visit (HOSPITAL_COMMUNITY): Payer: Self-pay

## 2024-02-25 DIAGNOSIS — C787 Secondary malignant neoplasm of liver and intrahepatic bile duct: Secondary | ICD-10-CM | POA: Diagnosis not present

## 2024-02-25 DIAGNOSIS — Z7189 Other specified counseling: Secondary | ICD-10-CM | POA: Diagnosis not present

## 2024-02-25 DIAGNOSIS — C786 Secondary malignant neoplasm of retroperitoneum and peritoneum: Secondary | ICD-10-CM | POA: Diagnosis not present

## 2024-02-25 DIAGNOSIS — C801 Malignant (primary) neoplasm, unspecified: Secondary | ICD-10-CM | POA: Diagnosis not present

## 2024-02-25 DIAGNOSIS — Z515 Encounter for palliative care: Secondary | ICD-10-CM | POA: Diagnosis not present

## 2024-02-25 LAB — CBC WITH DIFFERENTIAL/PLATELET
Abs Immature Granulocytes: 0.65 10*3/uL — ABNORMAL HIGH (ref 0.00–0.07)
Basophils Absolute: 0 10*3/uL (ref 0.0–0.1)
Basophils Relative: 0 %
Eosinophils Absolute: 0 10*3/uL (ref 0.0–0.5)
Eosinophils Relative: 0 %
HCT: 28.8 % — ABNORMAL LOW (ref 36.0–46.0)
Hemoglobin: 9.2 g/dL — ABNORMAL LOW (ref 12.0–15.0)
Immature Granulocytes: 6 %
Lymphocytes Relative: 19 %
Lymphs Abs: 2.1 10*3/uL (ref 0.7–4.0)
MCH: 30.5 pg (ref 26.0–34.0)
MCHC: 31.9 g/dL (ref 30.0–36.0)
MCV: 95.4 fL (ref 80.0–100.0)
Monocytes Absolute: 0.7 10*3/uL (ref 0.1–1.0)
Monocytes Relative: 7 %
Neutro Abs: 7.4 10*3/uL (ref 1.7–7.7)
Neutrophils Relative %: 68 %
Platelets: 200 10*3/uL (ref 150–400)
RBC: 3.02 MIL/uL — ABNORMAL LOW (ref 3.87–5.11)
RDW: 13.5 % (ref 11.5–15.5)
WBC: 10.8 10*3/uL — ABNORMAL HIGH (ref 4.0–10.5)
nRBC: 0 % (ref 0.0–0.2)

## 2024-02-25 LAB — BASIC METABOLIC PANEL
Anion gap: 7 (ref 5–15)
BUN: 32 mg/dL — ABNORMAL HIGH (ref 8–23)
CO2: 28 mmol/L (ref 22–32)
Calcium: 8.4 mg/dL — ABNORMAL LOW (ref 8.9–10.3)
Chloride: 100 mmol/L (ref 98–111)
Creatinine, Ser: 0.86 mg/dL (ref 0.44–1.00)
GFR, Estimated: >60 mL/min (ref >60)
Glucose, Bld: 110 mg/dL — ABNORMAL HIGH (ref 70–99)
Potassium: 4.7 mmol/L (ref 3.5–5.1)
Sodium: 135 mmol/L (ref 135–145)

## 2024-02-25 LAB — GLUCOSE, CAPILLARY
Glucose-Capillary: 114 mg/dL — ABNORMAL HIGH (ref 70–99)
Glucose-Capillary: 130 mg/dL — ABNORMAL HIGH (ref 70–99)
Glucose-Capillary: 162 mg/dL — ABNORMAL HIGH (ref 70–99)

## 2024-02-25 LAB — PROTIME-INR
INR: 1 (ref 0.8–1.2)
Prothrombin Time: 13.5 s (ref 11.4–15.2)

## 2024-02-25 LAB — HEPARIN LEVEL (UNFRACTIONATED): Heparin Unfractionated: 0.6 [IU]/mL (ref 0.30–0.70)

## 2024-02-25 MED ORDER — TRAVASOL 10 % IV SOLN
INTRAVENOUS | Status: AC
  Filled 2024-02-25: qty 890.4

## 2024-02-25 MED ORDER — DEXAMETHASONE SODIUM PHOSPHATE 10 MG/ML IJ SOLN
6.0000 mg | Freq: Every day | INTRAMUSCULAR | Status: DC
  Administered 2024-02-26: 6 mg via INTRAVENOUS
  Filled 2024-02-25: qty 1

## 2024-02-25 NOTE — Progress Notes (Addendum)
 Tracy Preston   DOB:[birth year]   ZH#:[id number]      ASSESSMENT & PLAN:  1.  Diffuse peritoneal carcinomatosis with mild ascites Likely primary cervical, ovarian or endometrial carcinoma with liver mets and bilateral adnexal masses. - Newly diagnosed February 2025 - CT scan 02/04/2024 shows diffuse peritoneal carcinomatosis and mild ascites. - Pelvic ultrasound 02/05/2024 shows 2 cm heterogeneous area at the uterine fundus, endometrial malignancy not excluded. - S/p paracentesis 02/05/2024, 100 mL of yellow fluid removed. 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 -MRI pelvis and CT chest for staging done on 02/11/2024.  Shows abnormal soft tissue throughout the lower uterine and cervix could represent primary uterine/cervical carcinoma or mets. - S/p chemotherapy with carboplatin/Taxol initiated on 02/12/2024.  Tolerated well. - S/p Granix 300 daily x 5 completed 02/17/2024.  - GYN Onc following closely.  - Palliative team following - Medical oncology/Dr. Truett Perna following closely.   2.  Small bowel obstruction - Distal SBO with transition point in the RLQ   - Likely due to peritoneal carcinomatosis - NGT discontinued on 02/19/2024 - TPN ongoing. - Recommend advancing clears to full liquid.  - S/p CT a/p 02/22/24.  Showed persistent distal colonic intussusception, similar to CT of 02/04/2024 and may relate to underlying peritoneal tumor.  There was mild improvement in SBO. - IR eval for venting G-tube - Management per surgery   3.  Acute PE DVT, LLE - Per CT of the chest for staging purposes done 02/11/2024.  Showed incidental acute PE involving the distal right pulmonary artery and lobar and segmental RUL and RML pulmonary artery branches.  Small right pleural effusion. - On IV heparin, continue per protocol. - Consider switching to Eliquis when tolerating oral better.  - Monitor closely for active bleeding    4.  Anemia, normocytic - HGB improving 9.2 today      - Transfuse PRBC for hemoglobin <7.0. - No transfusional intervention warranted at this time. -Monitor CBC with differential every 2 days    5.  Nausea/vomiting - Continues on and off.  - Administer antiemetics Compazine as needed and Zofran ordered   6.  Hypertension - BP stable  - Monitor BP counts - Primary team following      Code Status DNR-Limited  Subjective:  Patient seen awake and alert laying in bed, multiple family members at bedside.  Reports that she feels much better today and thinks her abdomen is a little softer. Denies nausea and thinks she can increase her po intake. States she tolerated all of her chicken broth without feeling nauseous.   Encouraged to ambulate more.   Objective:  Vitals:   02/24/24 2110 02/25/24 0435  BP: 110/67 133/62  Pulse: 62 64  Resp: 18 18  Temp: 98.1 F (36.7 C) 97.8 F (36.6 C)  SpO2: 94% 93%     Intake/Output Summary (Last 24 hours) at 02/25/2024 0941 Last data filed at 02/24/2024 2314 Gross per 24 hour  Intake 1970.96 ml  Output --  Net 1970.96 ml     REVIEW OF SYSTEMS:   Constitutional: Denies fevers, chills or abnormal night sweats Eyes: Denies blurriness of vision, double vision or watery eyes Ears, nose, mouth, throat, and face: Denies mucositis or sore throat Respiratory: Denies cough, dyspnea or wheezes Cardiovascular: Denies palpitation, chest discomfort or lower extremity swelling Gastrointestinal:  +abdominal distention Skin: Denies abnormal skin rashes Lymphatics: Denies new lymphadenopathy or easy bruising Neurological: Denies numbness, tingling or new  weaknesses Behavioral/Psych: Mood is stable, no new changes  All other systems were reviewed with the patient and are negative.  PHYSICAL EXAMINATION: ECOG PERFORMANCE STATUS: 2 - Symptomatic, <50% confined to bed  Vitals:   02/24/24 2110 02/25/24 0435  BP: 110/67 133/62  Pulse: 62 64  Resp: 18  18  Temp: 98.1 F (36.7 C) 97.8 F (36.6 C)  SpO2: 94% 93%   Filed Weights   02/18/24 0715 02/19/24 0713 02/21/24 0500  Weight: 126 lb 12.2 oz (57.5 kg) 123 lb 10.9 oz (56.1 kg) 126 lb 1.7 oz (57.2 kg)    GENERAL: alert, no distress and comfortable SKIN: skin color, texture, turgor are normal, no rashes or significant lesions EYES: normal, conjunctiva are pink and non-injected, sclera clear OROPHARYNX: no exudate, no erythema and lips, buccal mucosa, and tongue normal  NECK: supple, thyroid normal size, non-tender, without nodularity LYMPH: no palpable lymphadenopathy in the cervical, axillary or inguinal LUNGS: clear to auscultation and percussion with normal breathing effort HEART: regular rate & rhythm and no murmurs and no lower extremity edema ABDOMEN: +Improving abd distention MUSCULOSKELETAL: no cyanosis of digits and no clubbing  PSYCH: alert & oriented x 3 with fluent speech NEURO: no focal motor/sensory deficits   All questions were answered. The patient knows to call the clinic with any problems, questions or concerns.   The total time spent in the appointment was 40 minutes encounter with patient including review of chart and various tests results, discussions about plan of care and coordination of care plan  Dawson Bills, NP 02/25/2024 9:41 AM    Labs Reviewed:  Lab Results  Component Value Date   WBC 10.8 (H) 02/25/2024   HGB 9.2 (L) 02/25/2024   HCT 28.8 (L) 02/25/2024   MCV 95.4 02/25/2024   PLT 200 02/25/2024   Recent Labs    02/19/24 0817 02/21/24 0323 02/22/24 0545 02/23/24 0636 02/24/24 0601 02/25/24 0553  NA 137 136   < > 133* 129* 135  K 4.4 4.4   < > 4.5 4.8 4.7  CL 104 103   < > 101 96* 100  CO2 25 25   < > 23 25 28   GLUCOSE 103* 109*   < > 101* 128* 110*  BUN 31* 38*   < > 34* 31* 32*  CREATININE 0.79 0.83   < > 0.81 0.94 0.86  CALCIUM 8.6* 8.5*   < > 8.3* 7.9* 8.4*  GFRNONAA >60 >60   < > >60 59* >60  PROT 6.0* 5.6*  --   --  5.5*   --   ALBUMIN 2.6* 2.5*  --   --  2.4*  --   AST 37 30  --   --  21  --   ALT 79* 59*  --   --  37  --   ALKPHOS 246* 231*  --   --  176*  --   BILITOT 0.3 0.4  --   --  0.5  --    < > = values in this interval not displayed.    Studies Reviewed:  CT ABDOMEN PELVIS W CONTRAST Result Date: 02/22/2024 CLINICAL DATA:  Ovarian cancer, bowel obstruction, restaging. * Tracking Code: BO * EXAM: CT ABDOMEN AND PELVIS WITH CONTRAST TECHNIQUE: Multidetector CT imaging of the abdomen and pelvis was performed using the standard protocol following bolus administration of intravenous contrast. RADIATION DOSE REDUCTION: This exam was performed according to the departmental dose-optimization program which includes automated exposure control, adjustment of  the mA and/or kV according to patient size and/or use of iterative reconstruction technique. CONTRAST:  OMNIPAQUE IOHEXOL 300 MG/ML  SOLN COMPARISON:  Abdominopelvic CT 02/04/2024, pelvic MRI 02/11/2024 and chest CT 02/11/2024. FINDINGS: Lower chest: Small right-greater-than-left pleural effusions with dependent atelectasis at both lung bases, new from 02/04/2024 and increased from 02/11/2024. Contrast filled and mildly distended distal esophagus. Hepatobiliary: Multiple hypoenhancing hepatic metastases are again noted. The largest lesion superior to the gallbladder is less well-defined on the current study and may be slightly smaller, measuring 1.4 cm on image 26/2 (previously 1.9 x 1.5 cm). Other lesions are grossly stable, including a lesion more superiorly in the central right lobe measuring 1.4 cm on image 18/2. Small hepatic cysts are also noted. No evidence of gallstones, gallbladder wall thickening or biliary dilatation. Pancreas: Unremarkable. No pancreatic ductal dilatation or surrounding inflammatory changes. Spleen: Stable 8 mm low-density splenic lesion on image 24/2, likely a cyst. The spleen is normal in size without other focal abnormality.  Adrenals/Urinary Tract: Both adrenal glands appear normal. No evidence of urinary tract calculus, suspicious renal lesion or hydronephrosis. The right renal pelvis is mildly dilated without secondary signs of ureteral obstruction. There are renal sinus cysts bilaterally. The bladder appears unremarkable for its degree of distention. Stomach/Bowel: Enteric contrast has passed into the mid colon. The stomach remains mildly distended. Small bowel distension has mildly improved. The colon remains decompressed. There is a persistent distal colonic intussusception with the sigmoid colon invaginated into the rectum. Appearance is similar to the previous CT from 02/04/2024. No evidence of bowel perforation. Vascular/Lymphatic: Small retroperitoneal and mesenteric lymph nodes appear unchanged. Mild aortic and branch vessel atherosclerosis without evidence of aneurysm or large vessel occlusion. The portal, superior mesenteric and splenic veins remain patent. Reproductive: Persistent ill-defined mass involving the cervix, measuring approximately 3.4 x 4.0 cm on image 62/2. There is mild distension of the endometrial cavity. A small peripherally calcified uterine fundal fibroid is noted. Persistent ill-defined adnexal masses bilaterally, measuring approximately 4.0 x 2.6 cm on the right (image 54/2) and 2.9 x 1.9 cm on the left (image 57/2). Other: Small to moderate volume of ascites, similar to previous CT. Diffuse omental nodularity may be minimally improved in the interval with a dominant component in the right mid abdomen measuring 3.8 x 2.3 cm on image 39/2. No extravasated enteric contrast or pneumoperitoneum. Musculoskeletal: No acute or significant osseous findings. Mild lumbar spondylosis. IMPRESSION: 1. Persistent distal colonic intussusception with the sigmoid colon invaginated into the rectum. Appearance is similar to the previous CT from 02/04/2024 and may relate to underlying peritoneal tumor. 2. Mild improvement  in distal partial small bowel obstruction with enteric contrast passing into the mid colon. The colon remains decompressed. 3. Persistent ill-defined cervical and bilateral adnexal masses consistent with known ovarian cancer. Associated peritoneal carcinomatosis may be minimally improved. No definite progressive disease. 4. Persistent hepatic metastases, one of which may be slightly smaller in the interval. 5. Small right-greater-than-left pleural effusions with dependent atelectasis at both lung bases, new from 02/04/2024 and increased from 02/11/2024. 6.  Aortic Atherosclerosis (ICD10-I70.0). Electronically Signed   By: Carey Bullocks M.D.   On: 02/22/2024 16:08   DG Abd Portable 1V-Small Bowel Obstruction Protocol-initial, 8 hr delay Result Date: 02/18/2024 CLINICAL DATA:  8 hour delay small bowel obstruction EXAM: PORTABLE ABDOMEN - 1 VIEW COMPARISON:  Abdominal x-ray 02/18/2024 FINDINGS: Enteric tube tip is in the fundus of the stomach with sidehole at the level of the gastroesophageal junction. Contrast  is seen throughout nondilated colon to the level of the rectum. No dilated bowel loops are seen. IMPRESSION: 1. Enteric tube tip is in the fundus of the stomach with sidehole at the level of the gastroesophageal junction. Recommend advancement 8 cm. 2. Contrast is seen throughout nondilated colon to the level of the rectum. No evidence for bowel obstruction. Electronically Signed   By: Darliss Cheney M.D.   On: 02/18/2024 20:57   DG Abd Portable 1V-Small Bowel Protocol-Position Verification Result Date: 02/18/2024 CLINICAL DATA:  [id number] Encounter for imaging study to confirm nasogastric (NG) tube placement [id number] EXAM: PORTABLE ABDOMEN - 1 VIEW COMPARISON:  02/10/2024 abdominal radiograph FINDINGS: Enteric tube terminates in the proximal stomach. No dilated small bowel loops. No evidence of pneumatosis or pneumoperitoneum. Mild elevation of the left hemidiaphragm with clear lung bases. IMPRESSION: Enteric  tube terminates in the proximal stomach. Electronically Signed   By: Delbert Phenix M.D.   On: 02/18/2024 13:42   ECHOCARDIOGRAM COMPLETE Result Date: 02/13/2024    ECHOCARDIOGRAM REPORT   Patient Name:   Tracy Preston Date of Exam: 02/13/2024 Medical Rec #:  [id number]          Height:       64.0 in Accession #:    [id number]         Weight:       118.9 lb Date of Birth:  05/09/1935          BSA:          1.568 m Patient Age:    88 years           BP:           142/72 mmHg Patient Gender: F                  HR:           71 bpm. Exam Location:  Inpatient Procedure: 2D Echo, Cardiac Doppler and Color Doppler (Both Spectral and Color            Flow Doppler were utilized during procedure). Indications:    Cardiomyopathy- Unspecified I42.9  History:        Patient has no prior history of Echocardiogram examinations.                 Risk Factors:Hypertension.  Sonographer:    Lucendia Herrlich RCS Referring Phys: [id number] Doreen Salvage AMIN IMPRESSIONS  1. Left ventricular ejection fraction, by estimation, is 65 to 70%. The left ventricle has normal function. The left ventricle has no regional wall motion abnormalities. Left ventricular diastolic parameters were normal.  2. Right ventricular systolic function is normal. The right ventricular size is normal. There is normal pulmonary artery systolic pressure. The estimated right ventricular systolic pressure is 23.8 mmHg.  3. The mitral valve is grossly normal. No evidence of mitral valve regurgitation. No evidence of mitral stenosis.  4. The aortic valve is calcified. Aortic valve regurgitation is trivial. Aortic valve sclerosis/calcification is present, without any evidence of aortic stenosis.  5. The inferior vena cava is normal in size with greater than 50% respiratory variability, suggesting right atrial pressure of 3 mmHg. FINDINGS  Left Ventricle: Left ventricular ejection fraction, by estimation, is 65 to 70%. The left ventricle has normal function. The left  ventricle has no regional wall motion abnormalities. Strain imaging was not performed. The left ventricular internal cavity  size was normal in size. There is no left ventricular hypertrophy. Left ventricular diastolic parameters were normal.  Right Ventricle: The right ventricular size is normal. No increase in right ventricular wall thickness. Right ventricular systolic function is normal. There is normal pulmonary artery systolic pressure. The tricuspid regurgitant velocity is 2.28 m/s, and  with an assumed right atrial pressure of 3 mmHg, the estimated right ventricular systolic pressure is 23.8 mmHg. Left Atrium: Left atrial size was normal in size. Right Atrium: Right atrial size was normal in size. Pericardium: There is no evidence of pericardial effusion. Mitral Valve: The mitral valve is grossly normal. No evidence of mitral valve regurgitation. No evidence of mitral valve stenosis. Tricuspid Valve: The tricuspid valve is grossly normal. Tricuspid valve regurgitation is trivial. No evidence of tricuspid stenosis. Aortic Valve: The aortic valve is calcified. Aortic valve regurgitation is trivial. Aortic regurgitation PHT measures 299 msec. Aortic valve sclerosis/calcification is present, without any evidence of aortic stenosis. Aortic valve mean gradient measures 9.0 mmHg. Aortic valve peak gradient measures 11.1 mmHg. Aortic valve area, by VTI measures 1.23 cm. Pulmonic Valve: The pulmonic valve was grossly normal. Pulmonic valve regurgitation is trivial. No evidence of pulmonic stenosis. Aorta: The aortic root and ascending aorta are structurally normal, with no evidence of dilitation. Venous: The inferior vena cava is normal in size with greater than 50% respiratory variability, suggesting right atrial pressure of 3 mmHg. IAS/Shunts: The atrial septum is grossly normal. Additional Comments: 3D imaging was not performed. Mild ascites is present.  LEFT VENTRICLE PLAX 2D LVIDd:         4.00 cm   Diastology  LVIDs:         2.60 cm   LV e' medial:    9.25 cm/s LV PW:         0.90 cm   LV E/e' medial:  6.4 LV IVS:        1.00 cm   LV e' lateral:   12.20 cm/s LVOT diam:     1.80 cm   LV E/e' lateral: 4.9 LV SV:         48 LV SV Index:   31 LVOT Area:     2.54 cm  RIGHT VENTRICLE             IVC RV S prime:     11.00 cm/s  IVC diam: 1.20 cm TAPSE (M-mode): 1.8 cm LEFT ATRIUM             Index        RIGHT ATRIUM           Index LA diam:        3.70 cm 2.36 cm/m   RA Area:     12.50 cm LA Vol (A2C):   29.1 ml 18.55 ml/m  RA Volume:   29.10 ml  18.55 ml/m LA Vol (A4C):   40.0 ml 25.50 ml/m LA Biplane Vol: 34.3 ml 21.87 ml/m  AORTIC VALVE                     PULMONIC VALVE AV Area (Vmax):    1.61 cm      PR End Diast Vel: 2.12 msec AV Area (Vmean):   1.25 cm AV Area (VTI):     1.23 cm AV Vmax:           166.67 cm/s AV Vmean:          138.000 cm/s AV VTI:            0.392 m AV Peak Grad:      11.1  mmHg AV Mean Grad:      9.0 mmHg LVOT Vmax:         105.50 cm/s LVOT Vmean:        67.950 cm/s LVOT VTI:          0.190 m LVOT/AV VTI ratio: 0.48 AI PHT:            299 msec  AORTA Ao Root diam: 3.10 cm Ao Asc diam:  2.80 cm MITRAL VALVE               TRICUSPID VALVE MV Area (PHT): 3.27 cm    TR Peak grad:   20.8 mmHg MV Decel Time: 232 msec    TR Vmax:        228.00 cm/s MV E velocity: 59.60 cm/s MV A velocity: 76.60 cm/s  SHUNTS MV E/A ratio:  0.78        Systemic VTI:  0.19 m                            Systemic Diam: 1.80 cm Lennie Odor MD Electronically signed by Lennie Odor MD Signature Date/Time: 02/13/2024/8:53:15 PM    Final    VAS [place] LOWER EXTREMITY VENOUS (DVT) Result Date: 02/12/2024  Lower Venous DVT Study Patient Name:  Tracy Preston  Date of Exam:   02/12/2024 Medical Rec #: [id number]           Accession #:    [id number] Date of Birth: 08-14-1935           Patient Gender: F Patient Age:   69 years Exam Location:  [facility] Procedure:      VAS [place] LOWER EXTREMITY VENOUS (DVT) Referring Phys:  Stephania Fragmin --------------------------------------------------------------------------------  Indications: Pulmonary embolism.  Risk Factors: Newly diagnosed peritoneal carcinomatosis. Comparison Study: No previous exams Performing Technologist: Jody Hill RVT, RDMS  Examination Guidelines: A complete evaluation includes B-mode imaging, spectral Doppler, color Doppler, and power Doppler as needed of all accessible portions of each vessel. Bilateral testing is considered an integral part of a complete examination. Limited examinations for reoccurring indications may be performed as noted. The reflux portion of the exam is performed with the patient in reverse Trendelenburg.  +---------+---------------+---------+-----------+----------+--------------+ RIGHT    CompressibilityPhasicitySpontaneityPropertiesThrombus Aging +---------+---------------+---------+-----------+----------+--------------+ CFV      Full           Yes      No                                  +---------+---------------+---------+-----------+----------+--------------+ SFJ      Full                                                        +---------+---------------+---------+-----------+----------+--------------+ FV Prox  Full           Yes      Yes                                 +---------+---------------+---------+-----------+----------+--------------+ FV Mid   Full           Yes      Yes                                 +---------+---------------+---------+-----------+----------+--------------+  FV DistalFull           Yes      Yes                                 +---------+---------------+---------+-----------+----------+--------------+ PFV      Full                                                        +---------+---------------+---------+-----------+----------+--------------+ POP      Full           Yes      Yes                                  +---------+---------------+---------+-----------+----------+--------------+ PTV      Full                                                        +---------+---------------+---------+-----------+----------+--------------+ PERO     Full                                                        +---------+---------------+---------+-----------+----------+--------------+   +---------+---------------+---------+-----------+----------+--------------+ LEFT     CompressibilityPhasicitySpontaneityPropertiesThrombus Aging +---------+---------------+---------+-----------+----------+--------------+ CFV      Full           Yes      Yes                                 +---------+---------------+---------+-----------+----------+--------------+ SFJ      Full                                                        +---------+---------------+---------+-----------+----------+--------------+ FV Prox  Full           Yes      Yes                                 +---------+---------------+---------+-----------+----------+--------------+ FV Mid   Full           Yes      Yes                                 +---------+---------------+---------+-----------+----------+--------------+ FV DistalFull           Yes      Yes                                 +---------+---------------+---------+-----------+----------+--------------+ PFV      Full                                                        +---------+---------------+---------+-----------+----------+--------------+  POP      Full           Yes      Yes                                 +---------+---------------+---------+-----------+----------+--------------+ PTV      None           No       No                   Acute          +---------+---------------+---------+-----------+----------+--------------+ PERO     None           No       No                   Acute           +---------+---------------+---------+-----------+----------+--------------+     Summary: BILATERAL: -No evidence of popliteal cyst, bilaterally. RIGHT: - There is no evidence of deep vein thrombosis in the lower extremity.  LEFT: - Findings consistent with acute deep vein thrombosis involving the left peroneal veins, and left posterior tibial veins.   *See table(s) above for measurements and observations. Electronically signed by Carolynn Sayers on 02/12/2024 at 8:18:12 PM.    Final    CT CHEST W CONTRAST Addendum Date: 02/11/2024 ADDENDUM REPORT: 02/11/2024 15:54 ADDENDUM: Critical Value/emergent results were called by telephone at the time of interpretation on 02/11/2024 at 3:54 pm to provider Eugene Garnet, MD, who verbally acknowledged these results. Electronically Signed   By: Delbert Phenix M.D.   On: 02/11/2024 15:54   Result Date: 02/11/2024 CLINICAL DATA:  Peritoneal carcinomatosis. Chest staging. * Tracking Code: BO * EXAM: CT CHEST WITH CONTRAST TECHNIQUE: Multidetector CT imaging of the chest was performed during intravenous contrast administration. RADIATION DOSE REDUCTION: This exam was performed according to the departmental dose-optimization program which includes automated exposure control, adjustment of the mA and/or kV according to patient size and/or use of iterative reconstruction technique. CONTRAST:  75mL OMNIPAQUE IOHEXOL 300 MG/ML  SOLN COMPARISON:  02/04/2024 CT abdomen/pelvis. 02/08/2024 chest radiograph. FINDINGS: Cardiovascular: Normal heart size. No significant pericardial effusion/thickening. Left anterior descending coronary atherosclerosis. Right PICC terminates in the middle third of the SVC. Atherosclerotic nonaneurysmal thoracic aorta. Incidental acute pulmonary embolism involving the distal right pulmonary artery and lobar and segmental right upper lobe and right middle lobe pulmonary artery branches. No saddle embolus. Normal caliber main pulmonary artery. RV/LV ratio 1.0.  Mediastinum/Nodes: No significant thyroid nodules. Fluid level in the mid to lower thoracic esophagus with enteric tube terminating in the proximal stomach. No right axillary adenopathy. Mild left axillary adenopathy up to 1.1 cm (series 2/image 61). No pathologically enlarged mediastinal or hilar nodes. Lungs/Pleura: No pneumothorax. Small layering right pleural effusion. No left pleural effusion. Calcified 1 cm posterior right lower lobe granuloma. Mild dependent bilateral lower lobe atelectasis. No acute consolidative airspace disease, lung masses or significant pulmonary nodules. Upper abdomen: Moderate upper abdominal ascites with nodular soft tissue caking in the visualized anterior left upper peritoneum, as seen on recent CT abdomen study. Several hypodense liver lesions scattered throughout the visualized liver, unchanged, largest 1.6 cm in the inferior liver on series 2/image 155. Musculoskeletal: No aggressive appearing focal osseous lesions. Mild thoracic spondylosis. IMPRESSION: 1. Incidental acute pulmonary embolism involving the distal right pulmonary artery and lobar and segmental right upper lobe and right middle lobe  pulmonary artery branches. No saddle embolus. Normal caliber main pulmonary artery. Borderline elevated RV/LV Ratio = 1.0. 2. Small layering right pleural effusion. Mild dependent bilateral lower lobe atelectasis. 3. Mild left axillary adenopathy, cannot exclude metastatic disease. No other evidence of metastatic disease in the chest. 4. One vessel coronary atherosclerosis. 5. Moderate upper abdominal ascites with nodular soft tissue caking in the visualized anterior left upper peritoneum, as seen on recent CT abdomen study, compatible with peritoneal carcinomatosis. Several hypodense liver lesions scattered throughout the visualized liver, unchanged, suspicious for liver metastases. Electronically Signed: By: Delbert Phenix M.D. On: 02/11/2024 15:28   MR PELVIS W WO CONTRAST Result  Date: 02/11/2024 CLINICAL DATA:  "Occult malignancy". Peritoneal carcinomatosis on abdominopelvic CT with bowel obstruction. EXAM: MRI PELVIS WITHOUT AND WITH CONTRAST TECHNIQUE: Multiplanar multisequence MR imaging of the pelvis was performed both before and after administration of intravenous contrast. CONTRAST:  5mL GADAVIST GADOBUTROL 1 MMOL/ML IV SOLN COMPARISON:  CT of 02/04/2024 FINDINGS: Urinary Tract: Primarily decompressed urinary bladder. Left renal sinus cysts without hydronephrosis. Bowel: Fluid-filled small bowel loops within the upper and mid pelvis measure up to 2.0 cm on 19/17. Improved from 3.4 cm on the prior exam. Again identified is an intussusception of the sigmoid into the rectum. There is mild hyperenhancement within the lead point including on 41/17, without restricted diffusion in this area. Vascular/Lymphatic: Ileocolic mesenteric adenopathy including at 2.2 x 1.8 cm on 08/17. No pelvic sidewall adenopathy. Reproductive: The endometrium is thickened for age including at 1.0 cm on 18/4. This is followed to the level of the cervix and lower uterine segment, where soft tissue fullness is again identified including on 18/4 and 21/3. Correlate restricted diffusion on 22/11. Example 3.7 x 3.7 cm on 31/17. There is amorphous soft tissue fullness within both adnexa, greater right than left. Example restricted diffusion on 17/11. The right adnexal soft tissue fullness measures 3.3 x 3.3 cm on 18/17. No separate ovarian tissue identified. Other: Peritoneal carcinomatosis, as evidenced by diffuse peritoneal enhancement and restricted diffusion (example 22/11). Small volume pelvic fluid. Musculoskeletal: No acute osseous abnormality. IMPRESSION: 1. Abnormal amorphous soft tissue throughout lower uterine segment and cervix could represent primary uterine/cervical carcinoma or metastatic disease. 2. Diffuse peritoneal carcinomatosis as better imaged on dedicated CT. Ileocolic mesenteric nodal  metastasis. 3. Right greater than left adnexal soft tissue fullness likely represents ovarian metastasis. 4. Chronic or recurrent sigmoid/rectal intussusception since 02/04/2024. Vague hyperenhancement in the region of the lead point, without dominant mass. Recommend physical exam correlation to exclude either primary rectosigmoid carcinoma or serosal sigmoid implant lead point. 5. Small volume pelvic fluid. 6. Improved small bowel dilatation since prior CT. Electronically Signed   By: Jeronimo Greaves M.D.   On: 02/11/2024 15:42   DG Abd 1 View Result Date: 02/10/2024 CLINICAL DATA:  [id number] Abdominal distention [id number] [phone] SBO (small bowel obstruction) (HCC) [id number] EXAM: ABDOMEN - 1 VIEW COMPARISON:  02/08/2024 FINDINGS: NG tube terminates within the proximal stomach. Worsening small bowel dilation in the mid abdomen measuring up to 5.3 cm (previously 3.3 cm. Paucity of bowel gas within the colon. No gross free intraperitoneal air. IMPRESSION: 1. Worsening small bowel obstruction. 2. NG tube terminates within the proximal stomach. Recommend advancement. Electronically Signed   By: Duanne Guess D.O.   On: 02/10/2024 09:35   DG Chest 1 View Result Date: 02/08/2024 CLINICAL DATA:  PICC line placement EXAM: CHEST  1 VIEW COMPARISON:  02/05/2024 FINDINGS: Right PICC line in place with the tip in  the SVC. NG tube is in the stomach. Heart and mediastinal contours within normal limits. No confluent opacities or effusions. No acute bony abnormality. IMPRESSION: Right PICC line tip in the SVC. No active cardiopulmonary disease. Electronically Signed   By: Charlett Nose M.D.   On: 02/08/2024 17:51   [place] EKG SITE RITE Result Date: 02/08/2024 If Site Rite image not attached, placement could not be confirmed due to current cardiac rhythm.  DG Abd Portable 1V Result Date: 02/08/2024 CLINICAL DATA:  Check gastric catheter placement EXAM: PORTABLE ABDOMEN - 1 VIEW COMPARISON:  02/04/2024 FINDINGS: Scattered large and  small bowel gas is noted. Mild small bowel dilatation is noted significantly improved when compared with the prior exam. Gastric catheter is noted with the tip in the stomach. No free air is noted. IMPRESSION: Persistent but significantly improved small bowel dilatation when compared with the prior exam. Electronically Signed   By: Alcide Clever M.D.   On: 02/08/2024 09:34   US Paracentesis Result Date: 02/05/2024 INDICATION: 89 year old with new abdominal distention, ascites. Request made for diagnostic and therapeutic paracentesis. EXAM: ULTRASOUND GUIDED DIAGNOSTIC  PARACENTESIS MEDICATIONS: 10 mL 1% lidocaine. COMPLICATIONS: None immediate. PROCEDURE: Informed written consent was obtained from the patient after a discussion of the risks, benefits and alternatives to treatment. A timeout was performed prior to the initiation of the procedure. Initial ultrasound scanning demonstrates a small amount of ascites within the left lateral abdomen interlooped with bowel. The left lateral abdomen was prepped and draped in the usual sterile fashion. 1% lidocaine was used for local anesthesia. Following this, a 19 gauge, 7-cm, Yueh catheter was introduced. An ultrasound image was saved for documentation purposes. The paracentesis was performed. The catheter was removed and a dressing was applied. The patient tolerated the procedure well without immediate post procedural complication. FINDINGS: A total of approximately 100 mL of yellow fluid was removed. Samples were sent to the laboratory as requested by the clinical team. IMPRESSION: Successful ultrasound-guided paracentesis yielding 100 mL of peritoneal fluid. Performed by: Loyce Dys PA-C Electronically Signed   By: Malachy Moan M.D.   On: 02/05/2024 14:31   US PELVIC COMPLETE WITH TRANSVAGINAL Result Date: 02/05/2024 CLINICAL DATA:  89 year old female with ascites, peritoneal carcinomatosis on CT Abdomen and Pelvis yesterday. EXAM: TRANSABDOMINAL AND  TRANSVAGINAL ULTRASOUND OF PELVIS TECHNIQUE: Both transabdominal and transvaginal ultrasound examinations of the pelvis were performed. Transabdominal technique was performed for global imaging of the pelvis including uterus, ovaries, adnexal regions, and pelvic cul-de-sac. It was necessary to proceed with endovaginal exam following the transabdominal exam to visualize the ovaries. COMPARISON:  CT Abdomen and Pelvis 02/04/2024. FINDINGS: Uterus Measurements: 6.9 x 3.9 x 3.9 cm = volume: 41 mL. No obvious myometrial mass. Endometrium Heterogeneous hypoechoic area at the fundus endometrium (image 49 of series 1). This area encompasses about 2 cm (image 55). See also cine series 3 images. Otherwise the echogenic endometrial stripe is up to 5 mm. Right ovary Measurements: 3.5 x 2.1 x 2.8 cm = volume: 11 mL. No ovarian mass identified. Left ovary Measurements: 2.3 x 1.5 x 1.7 cm = volume: 3 mL. No ovarian mass identified. Other findings Small volume ascites again visible in the pelvis. IMPRESSION: 1. No ovarian mass identified by ultrasound. 2. There is a 2 cm heterogeneous area at the uterine fundus which is favored to be endometrial related. Elsewhere the endometrial stripe is about 5 mm. An Endometrial Malignancy is not excluded. 3. Ascites, in conjunction with the extensive additional abdominal  and pelvic abnormalities demonstrated by CT yesterday. Electronically Signed   By: Odessa Fleming M.D.   On: 02/05/2024 12:17   DG Chest Portable 1 View Result Date: 02/05/2024 CLINICAL DATA:  NG tube replacement confirmation EXAM: PORTABLE CHEST - 1 VIEW COMPARISON:  02/04/2024 FINDINGS: Cardiomediastinal silhouette and pulmonary vasculature are within normal limits. Lungs are clear. Nasogastric tube extends to the left upper quadrant the side hole located at the level of the gastroesophageal junction. It is slightly retracted compared to prior exam. IMPRESSION: Nasogastric tube terminates in the left upper quadrant in the  expected site of the stomach. The side hole of the NG tube is located at the level of the gastroesophageal junction. Electronically Signed   By: Acquanetta Belling M.D.   On: 02/05/2024 07:38   DG Chest Portable 1 View Result Date: 02/04/2024 CLINICAL DATA:  NG tube placement. Abdominal discomfort, heartburn, and nausea. EXAM: PORTABLE CHEST 1 VIEW COMPARISON:  None Available. FINDINGS: The heart size and mediastinal contours are within normal limits. There is atherosclerotic calcification of the aorta. No consolidation, effusion, or pneumothorax. An enteric tube terminates in the stomach. No acute osseous abnormality. IMPRESSION: 1. No active disease. 2. Enteric tube terminates in the stomach. Electronically Signed   By: Thornell Sartorius M.D.   On: 02/04/2024 21:20   CT ABDOMEN PELVIS W CONTRAST Result Date: 02/04/2024 CLINICAL DATA:  Abdominal pain, bloating, nausea and vomiting for 2 weeks. * Tracking Code: BO * EXAM: CT ABDOMEN AND PELVIS WITH CONTRAST TECHNIQUE: Multidetector CT imaging of the abdomen and pelvis was performed using the standard protocol following bolus administration of intravenous contrast. RADIATION DOSE REDUCTION: This exam was performed according to the departmental dose-optimization program which includes automated exposure control, adjustment of the mA and/or kV according to patient size and/or use of iterative reconstruction technique. CONTRAST:  OMNIPAQUE IOHEXOL 300 MG/ML  SOLN COMPARISON:  None Available. FINDINGS: Lower Chest: No acute findings. Hepatobiliary: A few tiny hepatic cysts are seen, however, there are multiple hypovascular masses involving the right and left hepatic lobes, largest in the central right hepatic lobe measuring 1.9 x 1.5 cm on image 26/2. These are consistent with liver metastases. Gallbladder is unremarkable. No evidence of biliary ductal dilatation. Pancreas:  No mass or inflammatory changes. Spleen: Within normal limits in size and appearance.  Adrenals/Urinary Tract: No suspicious masses identified. No evidence of ureteral calculi or hydronephrosis. Stomach/Bowel: Distal small bowel obstruction, with transition point in the right lower quadrant, likely due to peritoneal carcinoma. Rectal intussusception incidentally noted. Vascular/Lymphatic: Mild retroperitoneal lymphadenopathy in the left para-aortic and aortocaval spaces. No acute vascular findings. Reproductive: A 1.6 cm peripherally calcified fibroid is seen in the uterine fundus. Poorly defined masslike soft tissue prominence is seen in the region of the cervix and lower uterine segment measuring 3.6 x 3.3 cm on image 63/2, possibly representing a primary cervical or endometrial carcinoma. Poorly defined soft tissue density is seen in both adnexal regions and along the uterine fundus, consistent with peritoneal carcinomatosis. Other: Mild ascites is seen. Peritoneal and omental soft tissue nodules and masses are seen throughout the abdomen and pelvis, consistent with peritoneal carcinomatosis. Largest conglomerate mass is seen in the right abdomen measuring approximately 6.7 x 3.4 cm on image 37/2. Musculoskeletal:  No suspicious bone lesions identified. IMPRESSION: Diffuse peritoneal carcinomatosis and mild ascites. Distal small bowel obstruction, with transition point in the right lower quadrant, likely due to peritoneal carcinomatosis. Masslike soft tissue prominence in the region of the lower uterine  segment and cervix, possibly representing primary cervical or endometrial carcinoma. Liver metastases. Mild retroperitoneal lymphadenopathy, consistent with metastatic disease. Rectal intussusception incidentally noted. No definite lead mass identified. Electronically Signed   By: Danae Orleans M.D.   On: 02/04/2024 11:34  Tracy Preston is interviewed and examined.  She is now at day 14 following cycle 1 Taxol/carboplatin.  Obstructive symptoms appear improved over the past few days with less nausea  and abdominal distention.  She is having bowel movements and flatus. She discussed gastrostomy tube placement with interventional radiology.  She will discuss this further with her family over the weekend.   We will make a decision on proceeding with cycle 2 chemotherapy based on her clinical status over the next several days.  Recommendations: Liquid diet as tolerated Wean Decadron per Dr. Patterson Hammersmith Decision on palliative gastrostomy based on patient/family discussion and clinical status over the next few days Transition anticoagulation to apixaban following gastrostomy placement or if she does not require a gastrostomy tube

## 2024-02-25 NOTE — Progress Notes (Signed)
 PHARMACY - ANTICOAGULATION CONSULT NOTE  Pharmacy Consult for heparin  Indication: acute pulmonary embolus and DVT  Allergies  Allergen Reactions   Calcium-Containing Compounds Other (See Comments)    Upsets the stomach, so it is not taken very often    Patient Measurements: Height: 5\' 4"  (162.6 cm) Weight: 57.2 kg (126 lb 1.7 oz) IBW/kg (Calculated) : 54.7 Heparin Dosing Weight: use total body weight   Vital Signs: Temp: 97.8 F (36.6 C) (02/28 0435) Temp Source: Oral (02/28 0435) BP: 133/62 (02/28 0435) Pulse Rate: 64 (02/28 0435)  Labs: Recent Labs    02/23/24 0636 02/24/24 0601 02/25/24 0553  HGB 9.0* 8.7* 9.2*  HCT 27.6* 26.7* 28.8*  PLT 223 204 200  LABPROT  --   --  13.5  INR  --   --  1.0  HEPARINUNFRC 0.52 0.46 0.60  CREATININE 0.81 0.94 0.86    Estimated Creatinine Clearance: 40.5 mL/min (by C-G formula based on SCr of 0.86 mg/dL).  Assessment: 89 y/o F with metastatic ovarian cancer admitted with SBO secondary to peritoneal carcinomatosis. Chest CT on 2/14 showed incidental acute PE in the "the distal right pulmonary artery and lobar and segmental right upper lobe and right middle lobe pulmonary artery branches" with borderline elevated RV/LV ratio.  LE doppler on 02/12/24 positive for acute DVT in the left peroneal veins and left posterior tibial veins. Pharmacy consulted for IV heparin dosing for VTE treatment.  Significant events:  - 2/15: received carboplatin/paclitaxel   Today, 02/25/2024: Heparin level remains therapeutic at 0.60 with heparin infusing at 900 units/hr UFH infusing via PICC, labs being obtained via peripheral stick CBC: Hgb low but stable, Plt WNL Note chemo received 2/15 No bleeding or complications of therapy reported  Goal of Therapy:  Heparin level 0.3-0.7 units/ml Monitor platelets by anticoagulation protocol: Yes   Plan:  Continue heparin infusion at 900 units/hr Monitor daily heparin level, CBC, signs/symptoms of  bleeding F/U transition to direct oral anticoagulant (DOAC) when tolerating orals    Thank you for allowing pharmacy to be a part of this patient's care.  Cherylin Mylar, PharmD Clinical Pharmacist  2/28/20257:14 AM

## 2024-02-25 NOTE — Progress Notes (Signed)
 PROGRESS NOTE  Tracy Preston  BMW:[id number] DOB: 1935-06-18 DOA: 02/04/2024 PCP: Marden Noble, MD (Inactive)   Brief Narrative: Patient is a 89 year old female with history of hypertension, breast cancer who presented with abdominal distention, vomiting for 2 weeks.  CT abdomen was concerning for new metastatic disease from possible GYN origin, SBO.  Further workup showed peritoneal carcinomatosis with likely GYN malignancy.  GYN oncology, general surgery consulted pelvic MRI showed advanced GYN malignancy.  CT chest with contrast showed distal pulmonary present with cor pulmonale, started on heparin drip.  Received first cycle of chemo on 2/15.  Hospital course remarkable for persistent bowel obstruction, started on TPN, Decadron.  Palliative care following.  Now having bowel movements.  Small bowel SBO protocol shows nondilated colon with contrast on entire colon. NG tube removed on 2/22, started on clears.  After that she developed intermittent nausea, andominal distention. Consulted  IR for exploring option of venting PEG tube  Assessment & Plan:  Principal Problem:   Carcinomatosis peritonei causing obstruction Active Problems:   SBO (small bowel obstruction) (HCC)   GERD (gastroesophageal reflux disease)   Essential hypertension   Peritoneal carcinomatosis (HCC)   Anemia   Malnutrition of moderate degree   Palliative care encounter   Adenocarcinoma [facility])   Malignant neoplasm metastatic to liver (HCC)   DNR (do not resuscitate)   Goals of care, counseling/discussion   Need for emotional support   Counseling and coordination of care   Nausea and vomiting   Medication management  Malignant small bowel obstruction secondary to peritoneal carcinomatosis/ovarian adenocarcinoma with metastasis: Presented with abdominal distention, vomiting.GYN oncology, general surgery consulted.The  pelvic MRI showed advanced GYN malignancy.  CT chest with contrast showed distal PE, started on  heparin drip.  Received first cycle of chemo on 2/15.  Hospital course remarkable for persistent bowel obstruction, started on TPN, Decadron. 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  Small bowel SBO protocol on 2/22 showed nondilated colon with contrast on entire colon. NG tube removed.  She is having bowel movements and passing gas but has intermittent nausea,  abdominal distention.  CT abdomen/pelvis done on 2/25 did not show any significant changes from last time.   Consulted IR for PEG vent.  IR following.  Patient will discuss with her family and decide  Right-sided distal PE: Started on heparin drip.  Lower EXTR Doppler positive for DVT.  Eventual plan is to transition to Eliquis.  Echo showed preserved EF  Leukocytosis: She received Granix by oncology. Improving.   No fever.  Poor nutritional status/severe protein calorie malnutrition: On TPN  Hypertension: Blood pressure soft so losartan will be discontinued for now  GERD: Continue PPI  Normocytic anemia: Currently hemoglobin stable  Goals of care:Palliative care was following for goals of care .CODE STATUS DNR   Nutrition Problem: Moderate Malnutrition Etiology: acute illness    DVT prophylaxis:iv heparin     Code Status: Limited: Do not attempt resuscitation (DNR) -DNR-LIMITED -Do Not Intubate/DNI   Family Communication:  discussed with son-in-law Dr. Venetia Maxon on at bedside on 2/25.  Discussed with daughter at bedside on 2/26   Patient status:Inpatient  Patient is from :Home  Anticipated discharge UV:[id number]  Estimated DC date: Not sure at the moment.  Awaiting decision on PEG when from patient and IR.  Still on clear liquid diet   Consultants: General Surgery, palliative care, oncology,IR  Procedures: None yet  Antimicrobials:  Anti-infectives (From admission, onward)  None       Subjective: Patient seen and examined at bedside today.  She  feels better today.  No nausea.  Had a bowel movement this morning.  Abdomen is slightly distended but she denies any abdomen pain.  She talked to IR yesterday and explored the option of PEG went.  She is discussing with her family and anticipating decision early next week  Objective: Vitals:   02/24/24 0414 02/24/24 1243 02/24/24 2110 02/25/24 0435  BP: (!) 108/55 136/78 110/67 133/62  Pulse: 70 75 62 64  Resp: 18 16 18 18   Temp: 98.2 F (36.8 C) (!) 97.2 F (36.2 C) 98.1 F (36.7 C) 97.8 F (36.6 C)  TempSrc: Oral Oral Oral Oral  SpO2: 96% 91% 94% 93%  Weight:      Height:        Intake/Output Summary (Last 24 hours) at 02/25/2024 1102 Last data filed at 02/24/2024 2314 Gross per 24 hour  Intake 514.16 ml  Output --  Net 514.16 ml   Filed Weights   02/18/24 0715 02/19/24 0713 02/21/24 0500  Weight: 57.5 kg 56.1 kg 57.2 kg    Examination:    General exam: Overall comfortable, not in distress, pleasant elderly female HEENT: PERRL Respiratory system:  no wheezes or crackles  Cardiovascular system: S1 & S2 heard, RRR.  Gastrointestinal system: Abdomen is mildly distended, soft and nontender. Central nervous system: Alert and oriented Extremities: No edema, no clubbing ,no cyanosis, PICC line Skin: No rashes, no ulcers,no icterus        Data Reviewed: I have personally reviewed following labs and imaging studies  CBC: Recent Labs  Lab 02/21/24 0323 02/22/24 0545 02/23/24 0636 02/24/24 0601 02/25/24 0553  WBC 12.6* 12.7* 12.8* 10.8* 10.8*  NEUTROABS 8.3* 8.3* 8.4* 7.5 7.4  HGB 9.5* 8.8* 9.0* 8.7* 9.2*  HCT 29.6* 28.0* 27.6* 26.7* 28.8*  MCV 96.1 97.9 94.8 95.4 95.4  PLT 247 222 223 204 200   Basic Metabolic Panel: Recent Labs  Lab 02/21/24 0323 02/22/24 0545 02/23/24 0636 02/24/24 0601 02/25/24 0553  NA 136 137 133* 129* 135  K 4.4 4.2 4.5 4.8 4.7  CL 103 104 101 96* 100  CO2 25 23 23 25 28   GLUCOSE 109* 107* 101* 128* 110*  BUN 38* 43* 34* 31* 32*   CREATININE 0.83 0.93 0.81 0.94 0.86  CALCIUM 8.5* 8.5* 8.3* 7.9* 8.4*  MG 2.0 2.1  --  2.1  --   PHOS 4.0 3.4  --  3.6  --      No results found for this or any previous visit (from the past 240 hours).   Radiology Studies: No results found.    Scheduled Meds:  Chlorhexidine Gluconate Cloth  6 each Topical Daily   dexamethasone (DECADRON) injection  8 mg Intravenous Daily   insulin aspart  0-9 Units Subcutaneous Q8H   octreotide  100 mcg Subcutaneous Q8H   pantoprazole (PROTONIX) IV  40 mg Intravenous Q12H   sodium chloride flush  10-40 mL Intracatheter Q12H   Continuous Infusions:  heparin 900 Units/hr (02/25/24 0730)   TPN ADULT (ION) 70 mL/hr at 02/25/24 0414   TPN ADULT (ION)       LOS: 21 days   Burnadette Pop, MD [facility] P2/28/2025, 11:02 AM

## 2024-02-25 NOTE — Progress Notes (Signed)
 GYN Onc Progress Note  Subjective: Patient states she has tolerated chicken broth with no nausea or emesis. Reports having an informative conversation with IR yesterday about possible G tube placement. She states her family will be coming in this weekend and the plan will be to see how she continues to do and have further discussion with her family over the weekend. She is appreciative of the consult. No needs voiced. Family from [place] at the bedside.   Objective: Vital signs in last 24 hours: Temp:  [97.2 F (36.2 C)-98.1 F (36.7 C)] 97.8 F (36.6 C) (02/28 0435) Pulse Rate:  [62-75] 64 (02/28 0435) Resp:  [16-18] 18 (02/28 0435) BP: (110-136)/(62-78) 133/62 (02/28 0435) SpO2:  [91 %-94 %] 93 % (02/28 0435) Last BM Date : 02/24/24  Intake/Output from previous day: 02/27 0701 - 02/28 0700 In: 1971 [P.O.:120; I.V.:1851] Out: -   Physical Examination: Gen: NAD, alert and oriented;  Pulm: Breathing unlabored.  Abdomen remains mildly distended  Labs:    Latest Ref Rng & Units 02/25/2024    5:53 AM 02/24/2024    6:01 AM 02/23/2024    6:36 AM  CBC  WBC 4.0 - 10.5 K/uL 10.8  10.8  12.8   Hemoglobin 12.0 - 15.0 g/dL 9.2  8.7  9.0   Hematocrit 36.0 - 46.0 % 28.8  26.7  27.6   Platelets 150 - 400 K/uL 200  204  223       Latest Ref Rng & Units 02/25/2024    5:53 AM 02/24/2024    6:01 AM 02/23/2024    6:36 AM  BMP  Glucose 70 - 99 mg/dL 147  829  562   BUN 8 - 23 mg/dL 32  31  34   Creatinine 0.44 - 1.00 mg/dL 1.30  8.65  7.84   Sodium 135 - 145 mmol/L 135  129  133   Potassium 3.5 - 5.1 mmol/L 4.7  4.8  4.5   Chloride 98 - 111 mmol/L 100  96  101   CO2 22 - 32 mmol/L 28  25  23    Calcium 8.9 - 10.3 mg/dL 8.4  7.9  8.3    CT chest on 02/11/24 + acute pulmonary embolism. Echocardiogram performed 02/13/2024: EF 65-70%. Doppler from 02/12/24 with +DVT in LLE.  Small bowel imaging on 02/18/2024. NG removed 02/19/24. CT AP with contrast on 02/22/2024 with persistent distal colonic  intussusception, mild improvement in distal partial small bowel obstruction.  Assessment: 89 y.o. 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 Truett Perna. She has received granix as well. PE diagnosed on staging CT chest from 02/11/2024-currently on heparin drip.   Pulmonary embolism: Dr. Pricilla Holm previously discussed with the patient hypercoagulable state that cancer causes. Doppler from 02/12/2024 +for DVT in LLE. Recommend no use of SCDs.  Patient started on heparin drip 02/11/24.  Goal would be to transition to DOAC once she is able to take p.o. and ready for discharge.  Malignant bowel obstruction: CT 02/22/2024. Appreciate IR consultation for discussion around G tube placement. At this time, patient would like to see how she does over the next few days and talk with her family over the weekend before making any decisions around this. Continue with current plan of care.    LOS: 21 days    Doylene Bode 02/25/2024, 11:10 AM

## 2024-02-25 NOTE — Progress Notes (Addendum)
 PHARMACY - TOTAL PARENTERAL NUTRITION CONSULT NOTE   Indication: SBO  Assessment: 22 yoF admitted 2/7 after 2 weeks of progressively worsening reflux and abdominal distention with n/v. 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 Plan for bowel rest +/- venting G-tube while patient undergoes chemotherapy to hopefully relieve obstruction. Pharmacy consulted for TPN management.  Glucose / Insulin: no hx DM; CBG goal 100-180 -CBGs 110-169 in last 24hrs -3 units of insulin given in last 24 hours  Electrolytes:  Na improved 129 >> 135; K 4.7 at higher end of normal range; CoCa 9.5 (WNL) Renal: SCr stable WNL; BUN elevated Hepatic: 2/27 labs: - LFTs improving and now WNL, Alk Phos elevated but improving - Tbili remains stable WNL - TG WNL 2/24 I/O: Continues to have improving bowel function as noted 2/27 per GynOnc (flatus, loose stools) - NG removed 2/22 - Stool: 2x - UOP: 6x unmeasured - no mIVF GI Imaging: - 2/7 CT a/p: distal SBO d/t diffuse peritoneal carcinomatosis, with transition point in RLQ, hepatic and retroperitoneal metastases; incidental finding of rectal intussusception - 2/11 AXR: persistent but significantly improved SB dilation when compared with prior - 2/13 AXR: Worsening small bowel obstruction.  - 2/14 MRI pelvis: Abnormal amorphous soft tissue throughout lower uterine segment and cervix with ileocolic and likely ovarian mets. - 2/25 CT abdomen pelvis: Persistent distal colonic intussusception with the sigmoid colon invaginated into the rectum. Appearance is similar to the previous CT from 02/04/2024 and may relate to underlying peritoneal tumor. Mild improvement in distal partial small bowel obstruction with enteric contrast passing into the mid colon. GI Surgeries / Procedures: NA  Central access: PICC TPN start date: 2/11  Nutritional Goals: Goal TPN rate is 70 mL/hr (provides 89 g of protein and 1717 kcals per day)  RD Assessment: Estimated Needs Total Energy Estimated Needs:  1625-1900 kcal Total Protein Estimated Needs: 80-95g Total Fluid Estimated Needs: 1.9L/day  Current Nutrition:  TPN, Diet advanced to clear liquid on 2/22 (patient reportedly unable to take more than small sips). Patient to discuss possible PEG vent with family.  Plan:  Continue custom TPN @ goal rate of 70 mL/hr Electrolytes in TPN:  Na - 110 mEq/L - decreased K - 10 mEq/L - decreased Ca - 3 mEq/L Mg - 5 mEq/L Phos - 15 mmol/L Cl:Ac ratio - 1:1 Add standard MVI and trace elements to TPN Continue to hold chromium for national backorder mIVF per MD sSSI q8h Monitor TPN labs on Mon/Thurs, BMP/Mg/Phos tomorrow F/u plans for PEG vent     Thank you for allowing pharmacy to be a part of this patient's care.  Cherylin Mylar, PharmD Clinical Pharmacist  2/28/20257:22 AM

## 2024-02-25 NOTE — Progress Notes (Signed)
 Daily Progress Note   Patient Name: Tracy Preston       Date: 02/25/2024 DOB: [birth year]  Age: 89 y.o. MRN#: [id number] Attending Physician: Burnadette Pop, MD Primary Care Physician: Marden Noble, MD (Inactive) Admit Date: 02/04/2024 Length of Stay: 21 days  Reason for Consultation/Follow-up: Establishing goals of care  Subjective:   Reviewed EMR prior to presenting to bedside.  Patient continuing to receive octreotide 100 mcg subcutaneous every 8 hours.  Patient documented to have 120 mL oral intake yesterday.  Presented to bedside to see patient.  Patient's son, Theron Arista, present at bedside.  Able to introduce myself as a member of the palliative medicine team.  Patient notes that IR was able to stop by and discussed with the venting G-tube procedure would incur.  Patient is more hopeful regarding procedure if needed based on discussion and explanation from IR.  Patient voiced great appreciation for the time provider spent discussing everything with her and explaining the risks and benefits. Then spent time reviewing patient's symptom burden at this time.  Patient notes that since starting octreotide, she has not had any nausea.  Patient notes that she was able to have some broth yesterday without any nausea or vomiting.  Patient is actually helpful to eat a little bit more of her clear liquids today.  Patient still having small bowel movements.  Patient feels that since starting octreotide, her abdominal swelling has decreased as well.  Patient feels that overall her symptoms have improved.  Noted continuation of octreotide at this time. We also discussed management of dexamethasone.  Noted would slowly start decreased dose with patient's improvement of symptoms on octreotide.  Patient agreement with this plan.    Patient going to have multiple family members present tomorrow so noted palliative medicine team will continue to follow and discuss care.  All questions answered at that time.   Thank patient and her son for allowing me to visit today.  Objective:   Vital Signs:  BP 133/62 (BP Location: Left Arm)   Pulse 64   Temp 97.8 F (36.6 C) (Oral)   Resp 18   Ht 5\' 4"  (1.626 m)   Wt 57.2 kg   SpO2 93%   BMI 21.65 kg/m   Physical Exam:  General: NAD, alert, sitting in bedside chair Cardiovascular: RRR, no edema in LE b/l Respiratory: no increased work of breathing noted, not in respiratory distress Neuro: A&Ox4, following commands easily Psych: appropriately answers all questions  Imaging: I personally reviewed recent imaging.   Assessment & Plan:   Assessment: Patient is a 89 year old female with past medical history of hypertension, CKD, and family history of breast cancer who was admitted on 02/04/2024 for management of abdominal distention and vomiting for approximately 2 weeks.  Imaging concerning for new metastatic ovarian cancer with severe small bowel obstruction.  Imaging also showed likely metastatic disease to liver and peritoneal carcinomatosis.  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  Gyn Onc and oncology consulted for recommendations.  Palliative medicine team consulted to assist with complex medical decision making.   Recommendations/Plan: # Complex medical decision making/goals of care: -Discussed care with patient as detailed above in HPI.  At this time continuing appropriate medical interventions.  Patient greatly appreciated information provided by IR regarding venting G-tube.  Patient is hopeful for continued improvement of symptoms so that she does not need to pursue venting G-tube for symptom management.  Patient continues to have open discussions with care team to best support her  quality of life moving forward as patient has noted multiple times that her quality of life is more important to her than her quantity of life.                 -  Code Status: Limited: Do not attempt resuscitation (DNR) -DNR-LIMITED -Do Not  Intubate/DNI    # Symptom management Patient is receiving these palliative interventions for symptom management with an intent to improve quality of life.                 -Nausea/vomiting in setting of severe malignant small bowel obstruction secondary to peritoneal carcinomatosis from ovarian cancer                              -Decrease IV dexamethasone to 6 mg daily   -Continue scheduled octreotide 100 mcg every 8 subcu.   # Psycho-social/Spiritual Support:  - Support System: daughters, son-in-law, 8 grandchildren    # Discharge Planning:  To Be Determined  -Have already placed outpatient PMT referral to follow-up at [facility] with hope patient will be able to complete this.  Discussed with: Patient, RN, patient's son  Thank you for allowing the palliative care team to participate in the care [name].  [name], DO Palliative Care Provider PMT # [phone]  If patient remains symptomatic despite maximum doses, please call PMT at [phone] between 0700 and 1900. Outside of these hours, please call attending, as PMT does not have night coverage.

## 2024-02-26 DIAGNOSIS — I2699 Other pulmonary embolism without acute cor pulmonale: Secondary | ICD-10-CM | POA: Diagnosis present

## 2024-02-26 DIAGNOSIS — Z7189 Other specified counseling: Secondary | ICD-10-CM | POA: Diagnosis not present

## 2024-02-26 DIAGNOSIS — C787 Secondary malignant neoplasm of liver and intrahepatic bile duct: Secondary | ICD-10-CM | POA: Diagnosis not present

## 2024-02-26 DIAGNOSIS — K2100 Gastro-esophageal reflux disease with esophagitis, without bleeding: Secondary | ICD-10-CM

## 2024-02-26 DIAGNOSIS — Z515 Encounter for palliative care: Secondary | ICD-10-CM | POA: Diagnosis not present

## 2024-02-26 DIAGNOSIS — C786 Secondary malignant neoplasm of retroperitoneum and peritoneum: Secondary | ICD-10-CM | POA: Diagnosis not present

## 2024-02-26 DIAGNOSIS — I10 Essential (primary) hypertension: Secondary | ICD-10-CM | POA: Diagnosis not present

## 2024-02-26 DIAGNOSIS — D509 Iron deficiency anemia, unspecified: Secondary | ICD-10-CM

## 2024-02-26 DIAGNOSIS — E871 Hypo-osmolality and hyponatremia: Secondary | ICD-10-CM

## 2024-02-26 DIAGNOSIS — C801 Malignant (primary) neoplasm, unspecified: Secondary | ICD-10-CM | POA: Diagnosis not present

## 2024-02-26 LAB — BASIC METABOLIC PANEL
Anion gap: 10 (ref 5–15)
BUN: 32 mg/dL — ABNORMAL HIGH (ref 8–23)
CO2: 25 mmol/L (ref 22–32)
Calcium: 8.6 mg/dL — ABNORMAL LOW (ref 8.9–10.3)
Chloride: 99 mmol/L (ref 98–111)
Creatinine, Ser: 0.72 mg/dL (ref 0.44–1.00)
GFR, Estimated: >60 mL/min (ref >60)
Glucose, Bld: 127 mg/dL — ABNORMAL HIGH (ref 70–99)
Potassium: 4.2 mmol/L (ref 3.5–5.1)
Sodium: 134 mmol/L — ABNORMAL LOW (ref 135–145)

## 2024-02-26 LAB — CBC WITH DIFFERENTIAL/PLATELET
Abs Immature Granulocytes: 0.5 10*3/uL — ABNORMAL HIGH (ref 0.00–0.07)
Basophils Absolute: 0 10*3/uL (ref 0.0–0.1)
Basophils Relative: 0 %
Eosinophils Absolute: 0 10*3/uL (ref 0.0–0.5)
Eosinophils Relative: 0 %
HCT: 30 % — ABNORMAL LOW (ref 36.0–46.0)
Hemoglobin: 9.8 g/dL — ABNORMAL LOW (ref 12.0–15.0)
Immature Granulocytes: 5 %
Lymphocytes Relative: 15 %
Lymphs Abs: 1.6 10*3/uL (ref 0.7–4.0)
MCH: 31.1 pg (ref 26.0–34.0)
MCHC: 32.7 g/dL (ref 30.0–36.0)
MCV: 95.2 fL (ref 80.0–100.0)
Monocytes Absolute: 0.9 10*3/uL (ref 0.1–1.0)
Monocytes Relative: 8 %
Neutro Abs: 7.8 10*3/uL — ABNORMAL HIGH (ref 1.7–7.7)
Neutrophils Relative %: 72 %
Platelets: 200 10*3/uL (ref 150–400)
RBC: 3.15 MIL/uL — ABNORMAL LOW (ref 3.87–5.11)
RDW: 13.9 % (ref 11.5–15.5)
WBC: 10.9 10*3/uL — ABNORMAL HIGH (ref 4.0–10.5)
nRBC: 0 % (ref 0.0–0.2)

## 2024-02-26 LAB — GLUCOSE, CAPILLARY
Glucose-Capillary: 124 mg/dL — ABNORMAL HIGH (ref 70–99)
Glucose-Capillary: 130 mg/dL — ABNORMAL HIGH (ref 70–99)
Glucose-Capillary: 139 mg/dL — ABNORMAL HIGH (ref 70–99)
Glucose-Capillary: 165 mg/dL — ABNORMAL HIGH (ref 70–99)

## 2024-02-26 LAB — MAGNESIUM: Magnesium: 1.9 mg/dL (ref 1.7–2.4)

## 2024-02-26 LAB — HEPARIN LEVEL (UNFRACTIONATED): Heparin Unfractionated: 0.65 [IU]/mL (ref 0.30–0.70)

## 2024-02-26 LAB — PHOSPHORUS: Phosphorus: 4 mg/dL (ref 2.5–4.6)

## 2024-02-26 MED ORDER — DEXAMETHASONE SODIUM PHOSPHATE 4 MG/ML IJ SOLN
4.0000 mg | Freq: Every day | INTRAMUSCULAR | Status: DC
  Administered 2024-02-27: 4 mg via INTRAVENOUS
  Filled 2024-02-26: qty 1

## 2024-02-26 MED ORDER — RIVAROXABAN 20 MG PO TABS
20.0000 mg | ORAL_TABLET | Freq: Every day | ORAL | Status: DC
Start: 2024-03-18 — End: 2024-02-26

## 2024-02-26 MED ORDER — APIXABAN 5 MG PO TABS
10.0000 mg | ORAL_TABLET | Freq: Two times a day (BID) | ORAL | Status: DC
  Administered 2024-02-26 – 2024-03-01 (×8): 10 mg via ORAL
  Filled 2024-02-26 (×9): qty 2

## 2024-02-26 MED ORDER — ACETAMINOPHEN 325 MG PO TABS: 650.0000 mg | ORAL_TABLET | Freq: Four times a day (QID) | ORAL | Status: DC | PRN

## 2024-02-26 MED ORDER — GUAIFENESIN 100 MG/5ML PO LIQD: 5.0000 mL | ORAL | Status: DC | PRN

## 2024-02-26 MED ORDER — HEPARIN (PORCINE) 25000 UT/250ML-% IV SOLN: 900.0000 [IU]/h | INTRAVENOUS | Status: DC

## 2024-02-26 MED ORDER — APIXABAN 5 MG PO TABS: 5.0000 mg | ORAL_TABLET | Freq: Two times a day (BID) | ORAL | Status: DC

## 2024-02-26 MED ORDER — PANTOPRAZOLE SODIUM 40 MG PO TBEC
40.0000 mg | DELAYED_RELEASE_TABLET | Freq: Every day | ORAL | Status: DC
  Administered 2024-02-27 – 2024-03-01 (×4): 40 mg via ORAL
  Filled 2024-02-26 (×4): qty 1

## 2024-02-26 MED ORDER — HEPARIN (PORCINE) 25000 UT/250ML-% IV SOLN
900.0000 [IU]/h | INTRAVENOUS | Status: AC
Start: 2024-02-26 — End: 2024-02-26

## 2024-02-26 MED ORDER — TRAVASOL 10 % IV SOLN
INTRAVENOUS | Status: AC
  Filled 2024-02-26: qty 890.4

## 2024-02-26 MED ORDER — RIVAROXABAN 15 MG PO TABS: 15.0000 mg | ORAL_TABLET | Freq: Two times a day (BID) | ORAL | Status: DC

## 2024-02-26 MED ORDER — MAGNESIUM SULFATE 2 GM/50ML IV SOLN
2.0000 g | Freq: Once | INTRAVENOUS | Status: AC
  Administered 2024-02-26: 2 g via INTRAVENOUS
  Filled 2024-02-26: qty 50

## 2024-02-26 NOTE — Assessment & Plan Note (Signed)
 Patient anticoagulation with IV heparin with good toleration, plan to transition to apixabn for anticoagulation,.

## 2024-02-26 NOTE — Progress Notes (Addendum)
 Progress Note   Patient: Tracy Preston WJX:[id number] DOB: February 02, 1935 DOA: 02/04/2024     22 DOS: the patient was seen and examined on 02/26/2024   Brief hospital course: Tracy Preston was admitted to the hospital with the working diagnosis of malignant small bowel obstruction secondary to peritoneal carcinomatous/ ovarian adenocarcinoma with metastasis.   89 year old female with history of hypertension, breast cancer who presented with abdominal distention, vomiting for 2 weeks.    CT abdomen was concerning for new metastatic disease from possible GYN origin, SBO.  Further workup showed peritoneal carcinomatosis with likely GYN malignancy.   GYN oncology, general surgery consulted, recommended pelvic MRI which showed advanced GYN malignancy.   CT chest with contrast showed distal pulmonary embolism present with cor pulmonale, started on heparin drip.   Received first cycle of chemo on 2/15.    Hospital course remarkable for persistent bowel obstruction, started on TPN, Decadron.  Palliative care following.  Now having bowel movements.  Small bowel SBO protocol shows nondilated colon with contrast on entire colon. NG tube removed on 2/22, started on clears.  After that she developed intermittent nausea, andominal distention. Consulted  IR for exploring option of venting PEG tube   Assessment and Plan: * Carcinomatosis peritonei causing obstruction Patient with improvement in her symptoms with supportive medical therapy including octreotide and TPN.  NG tube has been removed and she is tolerating some po intake.  Follow up with palliative care team and Oncology.  No plans for PEG at this point.   Acute pulmonary embolism (HCC) Patient anticoagulation with IV heparin with good toleration, plan to transition to apixabn for anticoagulation,.   Essential hypertension Continue close blood pressure monitoring, antihypertensive medications are on hold due to risk of hypotension.   GERD  (gastroesophageal reflux disease) Continue with pantoprazole.   Malnutrition of moderate degree Continue nutritional support with TPN.   Hyponatremia Renal function with serum cr at 0,72, K is 4,2 and serum bicarbonate at 25  Na 134   Plan to continue close follow up on renal function and electrolytes.  Avoid hypotension and nephrotoxic medications.   Anemia Chronic anemia related to malignancy.  Positive leukocytosis.  Plan to continue supportive medical care. No current indication for PRBC transfusion, follow up hgb is 9,8 and wbc is 10,8    Peritoneal carcinomatosis (HCC) CT imaging of the abdomen and pelvis on 2/7 revealing diffuse peritoneal carcinomatosis, masslike soft tissue prominence in the region of the lower ureter and segment and cervix, mild retroperitoneal lymphadenopathy and liver metastases Concern for gynecological malignancy as the source supported by presence of elevated CA 125. Dr. Tamela Oddi with gynecology oncology following, her input is appreciated.  Plan is for patient to have focused gynecological examination in the cancer center on 2/10.   IR guided paracentesis for cytology obtained 2/9, 100 cc obtained.  Awaiting final results.   Additional ways definitive diagnosis may be obtained may include biopsy of liver masses or samples taken from the cervix during planned pelvic exam on 2/10         Subjective: Patient is feeling better, abdominal pain has improved, has no nausea or vomiting, positive small bowel movements.   Physical Exam: Vitals:   02/25/24 2058 02/26/24 0501 02/26/24 0706 02/26/24 1329  BP: (!) 145/71 126/73  122/67  Pulse: 64 (!) 57  64  Resp: 18 18  16   Temp: 98.1 F (36.7 C) 98.4 F (36.9 C)  98.1 F (36.7 C)  TempSrc: Oral Oral  Oral  SpO2: 97% 98%  96%  Weight:   58 kg   Height:       Neurology awake and alert ENT with mild pallor with no icterus Cardiovascular with S1 and S2 present and regular with no gallops, rubs  or murmurs Respiratory with no rales or wheezing, no rhonchi Abdomen is distended and tympanic with no rebound tenderness or guarding,  No lower extremity edema  Data Reviewed:    Family Communication: I spoke with patient's daughter and son in law at the bedside, we talked in detail about patient's condition, plan of care and prognosis and all questions were addressed.   Disposition: Status is: Inpatient Remains inpatient appropriate because: possible discharge on Monday with TPN and octreotide   Planned Discharge Destination: Home     Author: Coralie Keens, MD 02/26/2024 2:02 PM  For on call review [email or web address].

## 2024-02-26 NOTE — Progress Notes (Signed)
 PHARMACY - ANTICOAGULATION CONSULT NOTE  Pharmacy Consult for heparin  Indication: acute pulmonary embolus and DVT  Allergies  Allergen Reactions   Calcium-Containing Compounds Other (See Comments)    Upsets the stomach, so it is not taken very often    Patient Measurements: Height: 5\' 4"  (162.6 cm) Weight: 58 kg (127 lb 13.9 oz) IBW/kg (Calculated) : 54.7 Heparin Dosing Weight: use total body weight   Vital Signs: Temp: 98.4 F (36.9 C) (03/01 0501) Temp Source: Oral (03/01 0501) BP: 126/73 (03/01 0501) Pulse Rate: 57 (03/01 0501)  Labs: Recent Labs    02/24/24 0601 02/25/24 0553 02/26/24 1026  HGB 8.7* 9.2* 9.8*  HCT 26.7* 28.8* 30.0*  PLT 204 200 200  LABPROT  --  13.5  --   INR  --  1.0  --   HEPARINUNFRC 0.46 0.60 0.65  CREATININE 0.94 0.86 0.72    Estimated Creatinine Clearance: 43.6 mL/min (by C-G formula based on SCr of 0.72 mg/dL).  Assessment: 89 y/o F with metastatic ovarian cancer admitted with SBO secondary to peritoneal carcinomatosis. Chest CT on 2/14 showed incidental acute PE in the "the distal right pulmonary artery and lobar and segmental right upper lobe and right middle lobe pulmonary artery branches" with borderline elevated RV/LV ratio.  LE doppler on 02/12/24 positive for acute DVT in the left peroneal veins and left posterior tibial veins. Pharmacy consulted for IV heparin dosing for VTE treatment.  Significant events:  - 2/15: received carboplatin/paclitaxel   Today, 02/26/2024: Heparin level remains therapeutic at 0.65 with heparin infusing at 900 units/hr UFH infusing via PICC, labs being obtained via peripheral stick CBC: Hgb low but stable, Plt WNL Note chemo received 2/15 No bleeding or complications of therapy reported  Goal of Therapy:  Heparin level 0.3-0.7 units/ml Monitor platelets by anticoagulation protocol: Yes   Plan:  Continue heparin infusion at 900 units/hr Monitor daily heparin level, CBC, signs/symptoms of  bleeding F/U transition to direct oral anticoagulant (DOAC) when tolerating orals    Thank you for allowing pharmacy to be a part of this patient's care.  Selinda Eon, PharmD, BCPS Clinical Pharmacist [place] 02/26/2024 11:18 AM  Addendum (late entry): 02/26/2024 1404  Received consult to transition to apixaban  Plan: Discontinue IV heparin and associated labs At time that IV heparin stopped, begin apixaban 10 mg po BID x 7 days followed by apixaban 5 mg po BID With no dose adjustments anticipated, Pharmacy will sign off and continue to monitor CBC as ordered by provider along with signs/symptoms of bleeding  Thank you for allowing pharmacy to be a part of this patient's care.  Selinda Eon, PharmD, BCPS Clinical Pharmacist [place] 02/27/2024 7:04 AM

## 2024-02-26 NOTE — Assessment & Plan Note (Signed)
 Chronic anemia related to malignancy.  Positive leukocytosis.  Plan to continue supportive medical care. No current indication for PRBC transfusion, follow up hgb is 9,8 and wbc is 10,8

## 2024-02-26 NOTE — Progress Notes (Signed)
 GYN Onc Progress Note  Subjective: Patient reports that she is overall doing well.  Has had some bowel output and overall feels that her stomach is less bloated and full.  Continues to tolerate small amounts of clear liquids.  No current nausea or vomiting.  Family at bedside.  Objective: Vital signs in last 24 hours: Temp:  [97.7 F (36.5 C)-98.4 F (36.9 C)] 98.4 F (36.9 C) (03/01 0501) Pulse Rate:  [57-64] 57 (03/01 0501) Resp:  [16-18] 18 (03/01 0501) BP: (126-145)/(65-73) 126/73 (03/01 0501) SpO2:  [91 %-98 %] 98 % (03/01 0501) Weight:  [127 lb 13.9 oz (58 kg)] 127 lb 13.9 oz (58 kg) (03/01 0706) Last BM Date : 02/25/24  Intake/Output from previous day: 02/28 0701 - 03/01 0700 In: 995 [I.V.:995] Out: -   Physical Examination: Gen: NAD, alert and oriented;  Pulm: Breathing unlabored.  Abdomen remains mildly distended but improved from prior. Normal BS.  Labs:    Latest Ref Rng & Units 02/25/2024    5:53 AM 02/24/2024    6:01 AM 02/23/2024    6:36 AM  CBC  WBC 4.0 - 10.5 K/uL 10.8  10.8  12.8   Hemoglobin 12.0 - 15.0 g/dL 9.2  8.7  9.0   Hematocrit 36.0 - 46.0 % 28.8  26.7  27.6   Platelets 150 - 400 K/uL 200  204  223       Latest Ref Rng & Units 02/25/2024    5:53 AM 02/24/2024    6:01 AM 02/23/2024    6:36 AM  BMP  Glucose 70 - 99 mg/dL 161  096  045   BUN 8 - 23 mg/dL 32  31  34   Creatinine 0.44 - 1.00 mg/dL 4.09  8.11  9.14   Sodium 135 - 145 mmol/L 135  129  133   Potassium 3.5 - 5.1 mmol/L 4.7  4.8  4.5   Chloride 98 - 111 mmol/L 100  96  101   CO2 22 - 32 mmol/L 28  25  23    Calcium 8.9 - 10.3 mg/dL 8.4  7.9  8.3    CT chest on 02/11/24 + acute pulmonary embolism. Echocardiogram performed 02/13/2024: EF 65-70%. Doppler from 02/12/24 with +DVT in LLE.  Small bowel imaging on 02/18/2024. NG removed 02/19/24. CT AP with contrast on 02/22/2024 with persistent distal colonic intussusception, mild improvement in distal partial small bowel  obstruction.  Assessment: 89 y.o. 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 Truett Perna. She has received granix as well. PE diagnosed on staging CT chest from 02/11/2024-currently on heparin drip.   Pulmonary embolism: Dr. Pricilla Holm previously discussed with the patient hypercoagulable state that cancer causes. Doppler from 02/12/2024 +for DVT in LLE.  Recommendations: - PE: Recommend no use of SCDs.  Patient started on heparin drip 02/11/24.  Goal would be to transition to DOAC once she is able to take p.o. and ready for discharge.  - Malignant bowel obstruction: CT 02/22/2024. Appreciate IR consultation for discussion around G tube placement. Continue TPN. Recommend discussing with pharmacy considering cycling TPN to prepare patient for discharge from hospital. Given that she is having bowel function with no nausea/vomiting, could consider discharge with cycling TPN at home and CLD for comfort until continued improvement in bowel function such that pt would be able to sustain nutrition by mouth and come  off TPN.    LOS: 22 days    Maygen Sirico 02/26/2024, 8:57 AM

## 2024-02-26 NOTE — Progress Notes (Signed)
 IP PROGRESS NOTE  Subjective:   Tracy Preston reports continued small bowel movements and flatus.  She is tolerating liquids without nausea or vomiting. Last vomited on 2/26. No new compaint.  Objective: Vital signs in last 24 hours: Blood pressure 126/73, pulse (!) 57, temperature 98.4 F (36.9 C), temperature source Oral, resp. rate 18, height 5\' 4"  (1.626 m), weight 127 lb 13.9 oz (58 kg), SpO2 98%.  Intake/Output from previous day: 02/28 0701 - 03/01 0700 In: 995 [I.V.:995] Out: -   Physical Exam:  HEENT: No thrush  Abdomen: Mildly distended, no mass, nontender Extremities: No leg edema   Portacath/PICC-without erythema  Lab Results: Recent Labs    02/25/24 0553 02/26/24 1026  WBC 10.8* 10.9*  HGB 9.2* 9.8*  HCT 28.8* 30.0*  PLT 200 200    BMET Recent Labs    02/24/24 0601 02/25/24 0553  NA 129* 135  K 4.8 4.7  CL 96* 100  CO2 25 28  GLUCOSE 128* 110*  BUN 31* 32*  CREATININE 0.94 0.86  CALCIUM 7.9* 8.4*    Lab Results  Component Value Date   CEA1 2.7 02/05/2024   [id number] 18 02/05/2024    Studies/Results: No results found.   Medications: I have reviewed the patient's current medications.  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 CT abdomen/pelvis 02/04/2024: Diffuse carcinomatosis and mild ascites, distal small bowel obstruction, masslike soft tissue prominence in the region of the lower urine segment/cervix, liver metastases, mild retroperitoneal lymphadenopathy, rectal intussusception Ultrasound pelvis 02/05/2024: No ovarian mass, 2 cm heterogenous area of the uterine fundus, ascites Paracentesis 02/05/2024: Adenocarcinoma, cytokeratin 7, PAX8, and p53 positive, consistent with a gynecologic primary, ER positive Endometrial biopsy 02/07/2024: Negative for endometrial intraepithelial neoplasia and malignancy MRI pelvis 02/11/2024: Abnormal soft tissue at the uterine segment and cervix,  diffuse carcinomatosis, right greater than left adnexal soft tissue fullness felt to represent ovarian metastases, sigmoid/rectal intussusception, small volume pelvic fluid, and's proved small bowel dilation CT chest 02/11/2024: Acute pulmonary embolism of the distal right pulmonary artery and lobar/segmental right upper lobe and right middle lobe pulmonary artery branches, small layering right pleural effusion, 1.1 cm left axillary lymph node, upper abdominal ascites with nodular soft tissue caking in the left upper peritoneum, hypodense liver lesions Cycle 1 Taxol/carboplatin 02/12/2024 CT abdomen/pelvis 02/22/2024-persistent distal colonic intussusception, mild improvement in distal partial small bowel obstruction, colon decompressed, persistent ill-defined cervical bilateral adnexal masses, 1 hepatic metastasis slightly smaller, small right greater than left pleural effusions-new 2.  Small bowel obstruction secondary to #1-NG tube placed discontinued 02/19/2024 Octreotide 02/23/2024 3.  Nutrition-TPN 4.  Right sided pulmonary embolism noted on chest CT 02/11/2024: Heparin   Ms. Durrett is now at day 15 following cycle 1 Taxol/carboplatin.  Her clinical status has improved over the past few days.  This is coincident with initiation of octreotide.  It is also possible the bowel obstruction is improving spontaneously or from chemotherapy.  She is tolerating liquids.  She is considering placement of a venting gastrostomy, but this does not appear to be needed at present.  She appears to be a candidate to transition to oral anticoagulation.  TPN can be changed to a cyclic schedule for home dosing.  I will meet with Ms. Kinnamon and her family tomorrow to discuss disposition plans and the indication for further chemotherapy.  We are waiting on results of molecular testing on the ascitic fluid cytology.  Recommendations: Liquid diet as tolerated Change anticoagulation to apixaban Continue  octreotide and  taper Decadron per palliative care medicine Transition TPN to nighttime dosing in anticipation of discharge to home over the next few days   LOS: 22 days   Thornton Papas, MD   02/26/2024, 11:00 AM

## 2024-02-26 NOTE — Assessment & Plan Note (Signed)
 Renal function with serum cr at 0,72, K is 4,2 and serum bicarbonate at 25  Na 134   Plan to continue close follow up on renal function and electrolytes.  Avoid hypotension and nephrotoxic medications.

## 2024-02-26 NOTE — Plan of Care (Signed)
  Problem: Education: Goal: Knowledge of General Education information will improve Description: Including pain rating scale, medication(s)/side effects and non-pharmacologic comfort measures Outcome: Progressing   Problem: Health Behavior/Discharge Planning: Goal: Ability to manage health-related needs will improve Outcome: Progressing   Problem: Clinical Measurements: Goal: Ability to maintain clinical measurements within normal limits will improve Outcome: Progressing Goal: Will remain free from infection Outcome: Progressing Goal: Diagnostic test results will improve Outcome: Progressing   Problem: Activity: Goal: Risk for activity intolerance will decrease Outcome: Progressing   Problem: Nutrition: Goal: Adequate nutrition will be maintained Outcome: Progressing   Problem: Pain Managment: Goal: General experience of comfort will improve and/or be controlled Outcome: Progressing   Problem: Safety: Goal: Ability to remain free from injury will improve Outcome: Progressing   Problem: Skin Integrity: Goal: Risk for impaired skin integrity will decrease Outcome: Progressing

## 2024-02-26 NOTE — Assessment & Plan Note (Signed)
 Continue close blood pressure monitoring, antihypertensive medications are on hold due to risk of hypotension.

## 2024-02-26 NOTE — Assessment & Plan Note (Addendum)
 Patient with improvement in her symptoms with supportive medical therapy including octreotide and TPN.  NG tube has been removed and she is tolerating some po intake.  Decreased dexamethasone to 4 mg daily. Change IV pantoprazole to po formulation,   Follow up with palliative care team and Oncology.  No plans for PEG at this point.

## 2024-02-26 NOTE — Progress Notes (Signed)
 Daily Progress Note   Patient Name: Tracy Preston       Date: 02/26/2024 DOB: Oct 04, 1935  Age: 89 y.o. MRN#: [id number] Attending Physician: Coralie Keens Primary Care Physician: Marden Noble, MD (Inactive) Admit Date: 02/04/2024 Length of Stay: 22 days  Reason for Consultation/Follow-up: Establishing goals of care  Subjective:   Reviewed EMR prior to presenting to bedside.  Patient initially seen ambulating in hallway.  Reviewed Dr. [place] Blas recommendations for today.  Presented to bedside once patient had finished with ambulation.  Patient's grandson present as well.  Discussed patient's care plan at this time.  Patient feels that her nausea and vomiting have improved with the octreotide at current dose.  Patient noted that her and Dr. [name] discussed potentially getting TPN at home to rotate so patient receives it at night.  Also considering advancing patient's diet since she has been tolerating p.o. well for the past couple of days.  Discussed with patient that if her symptoms are resolved, no reason to pursue venting G-tube for symptom management.  Discussed with coordinate with IDT about potentially getting patient on oral anticoagulation so she could be one step closer to getting discharge.  Also noted had discussed with pharmacy that patient's current dose of octreotide can be obtained from [facility] with enough notice so this could be continued at home.  Patient supportive of this plan as she would like to enjoy time at home.  Patient planning to follow-up with Dr. Truett Perna at the cancer center regarding further cancer directed therapy.  Noted patient could follow-up with outpatient palliative provider at cancer center as well to continue with symptom management and care planning.  Answered all questions as able at that time.  Noted palliative medicine to continue following with patient's medical journey.  Discussed care with IDT including hospitalist, pharmacist,  RN, oncologist, and gyn onc to coordinate care.  Objective:   Vital Signs:  BP 126/73 (BP Location: Left Arm)   Pulse (!) 57   Temp 98.4 F (36.9 C) (Oral)   Resp 18   Ht 5\' 4"  (1.626 m)   Wt 57.2 kg   SpO2 98%   BMI 21.65 kg/m   Physical Exam:  General: NAD, alert, sitting in bedside chair Cardiovascular: RRR, no edema in LE b/l Respiratory: no increased work of breathing noted, not in respiratory distress Neuro: A&Ox4, following commands easily Psych: appropriately answers all questions  Imaging: I personally reviewed recent imaging.   Assessment & Plan:   Assessment: Patient is a 89 year old female with past medical history of hypertension, CKD, and family history of breast cancer who was admitted on 02/04/2024 for management of abdominal distention and vomiting for approximately 2 weeks.  Imaging concerning for new metastatic ovarian cancer with severe small bowel obstruction.  Imaging also showed likely metastatic disease to liver and peritoneal carcinomatosis.  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  Gyn Onc and oncology consulted for recommendations.  Palliative medicine team consulted to assist with complex medical decision making.   Recommendations/Plan: # Complex medical decision making/goals of care: -Discussed care with patient as detailed above in HPI.  At this time continuing appropriate medical interventions.  Since patient's symptoms have improved with medication, no reason to pursue venting G-tube at this time.  Hoping to coordinate that patient can work towards discharge.  Plan would be for patient to follow-up with Dr. Truett Perna in the outpatient setting regarding cancer directed therapies.  Patient would also plan to follow-up with outpatient palliative  medicine at cancer center.                -  Code Status: Limited: Do not attempt resuscitation (DNR) -DNR-LIMITED -Do Not Intubate/DNI    # Symptom management Patient is receiving  these palliative interventions for symptom management with an intent to improve quality of life.                 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   # Psycho-social/Spiritual Support:  - Support System: daughters, son-in-law, 8 grandchildren    # Discharge Planning:  To Be Determined  -Have already placed outpatient PMT referral to follow-up at [facility].  Discussed with: Patient, RN, patient's son  Thank you for allowing the palliative care team to participate in the care [name].  [name], DO Palliative Care Provider PMT # [phone]  If patient remains symptomatic despite maximum doses, please call PMT at [phone] between 0700 and 1900. Outside of these hours, please call attending, as PMT does not have night coverage.

## 2024-02-26 NOTE — Progress Notes (Signed)
 PHARMACY - TOTAL PARENTERAL NUTRITION CONSULT NOTE   Indication: SBO  Assessment: [name] admitted 2/7 after 2 weeks of progressively worsening reflux and abdominal distention with n/v. 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 Plan for bowel rest +/- venting G-tube while patient undergoes chemotherapy to hopefully relieve obstruction. Pharmacy consulted for TPN management.  Glucose / Insulin: no hx DM; CBG goal 100-180 -CBGs 110-162 in last 24hrs -3 units of insulin given in last 24 hours  Electrolytes:  Na improved 129 >> 135>> 134-dipped back down slightly today; magnesium slightly low at 1.9 all others WNL Renal: SCr stable WNL; BUN elevated Hepatic: 2/27 labs: - LFTs improving and now WNL, Alk Phos elevated but improving - Tbili remains stable WNL - TG WNL 2/24 I/O: Continues to have improving bowel function as noted 2/27 per GynOnc (flatus, loose stools) - NG removed 2/22 - Stool: 3x - UOP: 8x unmeasured - no mIVF GI Imaging: - 2/7 CT a/p: distal SBO d/t diffuse peritoneal carcinomatosis, with transition point in RLQ, hepatic and retroperitoneal metastases; incidental finding of rectal intussusception - 2/11 AXR: persistent but significantly improved SB dilation when compared with prior - 2/13 AXR: Worsening small bowel obstruction.  - 2/14 MRI pelvis: Abnormal amorphous soft tissue throughout lower uterine segment and cervix with ileocolic and likely ovarian mets. - 2/25 CT abdomen pelvis: Persistent distal colonic intussusception with the sigmoid colon invaginated into the rectum. Appearance is similar to the previous CT from 02/04/2024 and may relate to underlying peritoneal tumor. Mild improvement in distal partial small bowel obstruction with enteric contrast passing into the mid colon. GI Surgeries / Procedures: NA  Central access: PICC TPN start date: 2/11  Nutritional Goals: Goal TPN rate is 70 mL/hr (provides 89 g of protein and 1717 kcals per day)  RD Assessment: Estimated  Needs Total Energy Estimated Needs: 1625-1900 kcal Total Protein Estimated Needs: 80-95g Total Fluid Estimated Needs: 1.9L/day  Current Nutrition:  TPN, Diet advanced to clear liquid on 2/22 (patient reportedly unable to take more than small sips).  Patient to discuss possible PEG vent with family.  Plan:  Magnesium 2 g IV x 1 today  Continue custom TPN @ goal rate of 70 mL/hr Electrolytes in TPN:  Na - 120 mEq/L - increased K - 10 mEq/L Ca - 3 mEq/L Mg - 5 mEq/L Phos - 15 mmol/L Cl:Ac ratio - 1:1 Add standard MVI and trace elements to TPN Continue to hold chromium for national backorder mIVF per MD sSSI q8h Monitor TPN labs on Mon/Thurs, BMP/magnesium/phosphorus tomorrow morning F/u plans for PEG vent     Thank you for allowing pharmacy to be a part of this patient's care.  Selinda Eon, PharmD, BCPS Clinical Pharmacist [facility] 02/26/2024 7:17 AM

## 2024-02-26 NOTE — Assessment & Plan Note (Signed)
 Continue with pantoprazole/.

## 2024-02-26 NOTE — Assessment & Plan Note (Signed)
 Continue nutritional support with TPN.

## 2024-02-27 DIAGNOSIS — C801 Malignant (primary) neoplasm, unspecified: Secondary | ICD-10-CM | POA: Diagnosis not present

## 2024-02-27 DIAGNOSIS — C787 Secondary malignant neoplasm of liver and intrahepatic bile duct: Secondary | ICD-10-CM | POA: Diagnosis not present

## 2024-02-27 DIAGNOSIS — R112 Nausea with vomiting, unspecified: Secondary | ICD-10-CM | POA: Diagnosis not present

## 2024-02-27 DIAGNOSIS — K56609 Unspecified intestinal obstruction, unspecified as to partial versus complete obstruction: Secondary | ICD-10-CM

## 2024-02-27 DIAGNOSIS — C786 Secondary malignant neoplasm of retroperitoneum and peritoneum: Secondary | ICD-10-CM | POA: Diagnosis not present

## 2024-02-27 DIAGNOSIS — Z515 Encounter for palliative care: Secondary | ICD-10-CM | POA: Diagnosis not present

## 2024-02-27 LAB — CBC WITH DIFFERENTIAL/PLATELET
Abs Immature Granulocytes: 0.46 10*3/uL — ABNORMAL HIGH (ref 0.00–0.07)
Basophils Absolute: 0 10*3/uL (ref 0.0–0.1)
Basophils Relative: 0 %
Eosinophils Absolute: 0 10*3/uL (ref 0.0–0.5)
Eosinophils Relative: 0 %
HCT: 30.4 % — ABNORMAL LOW (ref 36.0–46.0)
Hemoglobin: 9.7 g/dL — ABNORMAL LOW (ref 12.0–15.0)
Immature Granulocytes: 5 %
Lymphocytes Relative: 24 %
Lymphs Abs: 2.4 10*3/uL (ref 0.7–4.0)
MCH: 30.6 pg (ref 26.0–34.0)
MCHC: 31.9 g/dL (ref 30.0–36.0)
MCV: 95.9 fL (ref 80.0–100.0)
Monocytes Absolute: 0.9 10*3/uL (ref 0.1–1.0)
Monocytes Relative: 9 %
Neutro Abs: 6.4 10*3/uL (ref 1.7–7.7)
Neutrophils Relative %: 62 %
Platelets: 209 10*3/uL (ref 150–400)
RBC: 3.17 MIL/uL — ABNORMAL LOW (ref 3.87–5.11)
RDW: 13.9 % (ref 11.5–15.5)
WBC: 10.2 10*3/uL (ref 4.0–10.5)
nRBC: 0 % (ref 0.0–0.2)

## 2024-02-27 LAB — BASIC METABOLIC PANEL
Anion gap: 9 (ref 5–15)
BUN: 35 mg/dL — ABNORMAL HIGH (ref 8–23)
CO2: 26 mmol/L (ref 22–32)
Calcium: 8.5 mg/dL — ABNORMAL LOW (ref 8.9–10.3)
Chloride: 100 mmol/L (ref 98–111)
Creatinine, Ser: 0.86 mg/dL (ref 0.44–1.00)
GFR, Estimated: >60 mL/min (ref >60)
Glucose, Bld: 104 mg/dL — ABNORMAL HIGH (ref 70–99)
Potassium: 4.2 mmol/L (ref 3.5–5.1)
Sodium: 135 mmol/L (ref 135–145)

## 2024-02-27 LAB — GLUCOSE, CAPILLARY
Glucose-Capillary: 111 mg/dL — ABNORMAL HIGH (ref 70–99)
Glucose-Capillary: 120 mg/dL — ABNORMAL HIGH (ref 70–99)
Glucose-Capillary: 128 mg/dL — ABNORMAL HIGH (ref 70–99)

## 2024-02-27 LAB — PHOSPHORUS: Phosphorus: 4.1 mg/dL (ref 2.5–4.6)

## 2024-02-27 LAB — MAGNESIUM: Magnesium: 2 mg/dL (ref 1.7–2.4)

## 2024-02-27 MED ORDER — TRAVASOL 10 % IV SOLN
INTRAVENOUS | Status: AC
  Filled 2024-02-27: qty 800

## 2024-02-27 MED ORDER — DEXAMETHASONE 4 MG PO TABS
4.0000 mg | ORAL_TABLET | Freq: Every day | ORAL | Status: DC
  Administered 2024-02-28: 4 mg via ORAL
  Filled 2024-02-27: qty 1

## 2024-02-27 NOTE — Plan of Care (Signed)

## 2024-02-27 NOTE — Progress Notes (Signed)
 GYN Onc Progress Note  Subjective: No N/V.  Less bloating.  Objective: Vital signs in last 24 hours: Temp:  [98.1 F (36.7 C)-98.6 F (37 C)] 98.6 F (37 C) (03/02 0609) Pulse Rate:  [64] 64 (03/02 0609) Resp:  [16-20] 20 (03/02 0609) BP: (115-134)/(57-67) 115/57 (03/02 0609) SpO2:  [92 %-96 %] 92 % (03/02 0609) Last BM Date : 02/26/24  Intake/Output from previous day: 03/01 0701 - 03/02 0700 In: 1577.5 [I.V.:1535.2; IV Piggyback:42.3] Out: -   Physical Examination: Gen: NAD, alert and oriented;  Pulm: Breathing unlabored.  Abdomen: mildly distended; normal BS.  Labs:    Latest Ref Rng & Units 02/27/2024    5:25 AM 02/26/2024   10:26 AM 02/25/2024    5:53 AM  CBC  WBC 4.0 - 10.5 K/uL 10.2  10.9  10.8   Hemoglobin 12.0 - 15.0 g/dL 9.7  9.8  9.2   Hematocrit 36.0 - 46.0 % 30.4  30.0  28.8   Platelets 150 - 400 K/uL 209  200  200       Latest Ref Rng & Units 02/27/2024    5:25 AM 02/26/2024   10:26 AM 02/25/2024    5:53 AM  BMP  Glucose 70 - 99 mg/dL 956  213  086   BUN 8 - 23 mg/dL 35  32  32   Creatinine 0.44 - 1.00 mg/dL 5.78  4.69  6.29   Sodium 135 - 145 mmol/L 135  134  135   Potassium 3.5 - 5.1 mmol/L 4.2  4.2  4.7   Chloride 98 - 111 mmol/L 100  99  100   CO2 22 - 32 mmol/L 26  25  28    Calcium 8.9 - 10.3 mg/dL 8.5  8.6  8.4      Assessment/Plan: 89 y.o. with Stage IV gyn malignancy (favored to represent ovarian or primary peritoneal cancer) admitted with a malignant bowel obstruction, s/p dose-reduced carboplatin and taxol on 02/12/2024 managed by Dr. Truett Perna. She has received granix as well. PE diagnosed on staging CT chest from 02/11/2024-currently on heparin drip. Increased tolerance of liquid diet. [facility] notes appreciated--discharge planning   LOS: 23 days    Antionette Char, MD 02/27/2024, 12:35 PM

## 2024-02-27 NOTE — Progress Notes (Signed)
 IP PROGRESS NOTE  Subjective:   Tracy Preston reports continued small bowel movements and flatus.  She tolerated liquids yesterday.  No new complaint.  Her family members are at the bedside.  She would like to return home. Objective: Vital signs in last 24 hours: Blood pressure (!) 115/57, pulse 64, temperature 98.6 F (37 C), temperature source Oral, resp. rate 20, height 5\' 4"  (1.626 m), weight 127 lb 13.9 oz (58 kg), SpO2 92%.  Intake/Output from previous day: 03/01 0701 - 03/02 0700 In: 1577.5 [I.V.:1535.2; IV Piggyback:42.3] Out: -   Physical Exam:  HEENT: No thrush  Abdomen: Mildly distended, no mass, nontender Extremities: No leg edema Lymph nodes: 1 cm soft mobile left axillary node, less than 1 cm mobile right axillary node  Portacath/PICC-without erythema  Lab Results: Recent Labs    02/26/24 1026 02/27/24 0525  WBC 10.9* 10.2  HGB 9.8* 9.7*  HCT 30.0* 30.4*  PLT 200 209    BMET Recent Labs    02/26/24 1026 02/27/24 0525  NA 134* 135  K 4.2 4.2  CL 99 100  CO2 25 26  GLUCOSE 127* 104*  BUN 32* 35*  CREATININE 0.72 0.86  CALCIUM 8.6* 8.5*    Lab Results  Component Value Date   CEA1 2.7 02/05/2024   [id number] 18 02/05/2024    Studies/Results: No results found.   Medications: I have reviewed the patient's current medications.  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 CT abdomen/pelvis 02/04/2024: Diffuse carcinomatosis and mild ascites, distal small bowel obstruction, masslike soft tissue prominence in the region of the lower urine segment/cervix, liver metastases, mild retroperitoneal lymphadenopathy, rectal intussusception Ultrasound pelvis 02/05/2024: No ovarian mass, 2 cm heterogenous area of the uterine fundus, ascites Paracentesis 02/05/2024: Adenocarcinoma, cytokeratin 7, PAX8, and p53 positive, consistent with a gynecologic primary, ER positive Endometrial biopsy 02/07/2024: Negative  for endometrial intraepithelial neoplasia and malignancy MRI pelvis 02/11/2024: Abnormal soft tissue at the uterine segment and cervix, diffuse carcinomatosis, right greater than left adnexal soft tissue fullness felt to represent ovarian metastases, sigmoid/rectal intussusception, small volume pelvic fluid, and's proved small bowel dilation CT chest 02/11/2024: Acute pulmonary embolism of the distal right pulmonary artery and lobar/segmental right upper lobe and right middle lobe pulmonary artery branches, small layering right pleural effusion, 1.1 cm left axillary lymph node, upper abdominal ascites with nodular soft tissue caking in the left upper peritoneum, hypodense liver lesions Cycle 1 Taxol/carboplatin 02/12/2024 CT abdomen/pelvis 02/22/2024-persistent distal colonic intussusception, mild improvement in distal partial small bowel obstruction, colon decompressed, persistent ill-defined cervical bilateral adnexal masses, 1 hepatic metastasis slightly smaller, small right greater than left pleural effusions-new 2.  Small bowel obstruction secondary to #1-NG tube placed discontinued 02/19/2024 Octreotide 02/23/2024 3.  Nutrition-TPN 4.  Right sided pulmonary embolism noted on chest CT 02/11/2024: Heparin   Tracy Preston is now at day 16 following cycle 1 Taxol/carboplatin.  She tolerated the chemotherapy without significant acute toxicity.  Her clinical status continues to improve.  This is coincident with initiation of octreotide.  It is also possible the bowel obstruction is improving spontaneously or from chemotherapy.  She is tolerating liquids.  She would like to try full liquids.  A venting gastrostomy was being considered, but she does not appear to need the gastrostomy tube at present.  I discussed the diagnosis and treatment options with Tracy Preston and her family this morning.  She would like to proceed with a second cycle of chemotherapy next week.  She  plans to return home with help from  family members and as needed nursing care.  She does not wish to enroll in hospice at present, but will consider hospice if her condition worsens.  We are waiting on results of NGS testing on the ascitic cytology.  If there is inadequate DNA in the ascites we will consider a repeat paracentesis or IR directed tissue biopsy.  She will complete germline DNA testing as an outpatient.  Recommendations: Advance to full liquid diet as tolerated Continue apixaban Continue octreotide and taper Decadron per palliative care medicine Transition TPN to nighttime dosing per pharmacy Discharge to home next 1-2 days Outpatient palliative care Outpatient follow-up will be scheduled at the Cancer center   LOS: 23 days   Thornton Papas, MD   02/27/2024, 8:41 AM

## 2024-02-27 NOTE — Progress Notes (Signed)
 Daily Progress Note   Patient Name: Tracy Preston       Date: 02/27/2024 DOB: 1935/05/10  Age: 89 y.o. MRN#: [id number] Attending Physician: Briant Cedar, MD Primary Care Physician: Marden Noble, MD (Inactive) Admit Date: 02/04/2024 Length of Stay: 23 days  Reason for Consultation/Follow-up: Establishing goals of care  Subjective:   Reviewed EMR prior to presenting to bedside.  Discussed care with hospitalist, oncologist, and pharmacist to coordinate care.  Patient now transition to full liquid diet.  Patient started on oral anticoagulation on 02/26/2024.  Presented to bedside to see patient.  Patient's daughter and son-in-law present at bedside.  Patient sitting up in bedside chair.  Patient noted she is looking forward to ordering full liquid diet today for lunch.  Patient still feels that her nausea and vomiting are managed appropriately with the octreotide.  Discussed coordination with oncology and pharmacy to coordinate patient getting home with nightly TPN and medication management.  Spent time answering current medication management and taper steroids.  Also discussed patient to follow-up with outpatient palliative medicine team at [facility].  Spent time answering questions as able.  Noted palliative medicine team continue to follow with patient's medical journey.  Objective:   Vital Signs:  BP (!) 115/57 (BP Location: Left Arm)   Pulse 64   Temp 98.6 F (37 C) (Oral)   Resp 20   Ht 5\' 4"  (1.626 m)   Wt 58 kg   SpO2 92%   BMI 21.95 kg/m   Physical Exam:  General: NAD, alert, sitting in bedside chair Cardiovascular: RRR, no edema in LE b/l Respiratory: no increased work of breathing noted, not in respiratory distress Neuro: A&Ox4, following commands easily Psych: appropriately answers all questions  Imaging: I personally reviewed recent imaging.   Assessment & Plan:   Assessment: Patient is a 89 year old female with past medical history of hypertension, CKD, and  family history of breast cancer who was admitted on 02/04/2024 for management of abdominal distention and vomiting for approximately 2 weeks.  Imaging concerning for new metastatic ovarian cancer with severe small bowel obstruction.  Imaging also showed likely metastatic disease to liver and peritoneal carcinomatosis.  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  Gyn Onc and oncology consulted for recommendations.  Palliative medicine team consulted to assist with complex medical decision making.   Recommendations/Plan: # Complex medical decision making/goals of care: -Discussed care with patient with family at bedside as detailed above in HPI.  At this time continuing appropriate medical interventions.  Working with team to coordinate patient discharging later this week.  Plan would be for patient to follow-up with Dr. Truett Perna in the outpatient setting regarding cancer directed therapies.  Patient would also plan to follow-up with outpatient palliative medicine at cancer center.                -  Code Status: Limited: Do not attempt resuscitation (DNR) -DNR-LIMITED -Do Not Intubate/DNI    # Symptom management Patient is receiving these palliative interventions for symptom management with an intent to improve quality of life.                 -Nausea/vomiting in setting of severe malignant small bowel obstruction secondary to peritoneal carcinomatosis from ovarian cancer                              -Change IV dexamethasone to oral 4 mg daily starting 02/28/24   -  Continue scheduled octreotide 100 mcg every 8 subcu.   # Psycho-social/Spiritual Support:  - Support System: daughters, son-in-law, 8 grandchildren    # Discharge Planning:  To Be Determined  -Have already placed outpatient PMT referral to follow-up at [facility].  Discussed with: Patient, RN, patient's son  Thank you for allowing the palliative care team to participate in the care [name].  [name],  DO Palliative Care Provider PMT # [phone]  If patient remains symptomatic despite maximum doses, please call PMT at [phone] between 0700 and 1900. Outside of these hours, please call attending, as PMT does not have night coverage.

## 2024-02-27 NOTE — Progress Notes (Signed)
 PROGRESS NOTE  Tracy Preston  ZOX:[id number] DOB: May 18, 1935 DOA: 02/04/2024 PCP: Marden Noble, MD (Inactive)   Brief Narrative: Patient is a 89 year old female with history of hypertension, breast cancer who presented with abdominal distention, vomiting for 2 weeks.  CT abdomen was concerning for new metastatic disease from possible GYN origin, SBO.  Further workup showed peritoneal carcinomatosis with likely GYN malignancy.  GYN oncology, general surgery consulted pelvic MRI showed advanced GYN malignancy.  CT chest with contrast showed distal pulmonary PE present with cor pulmonale, started on heparin drip.  Received first cycle of chemo on 2/15.  Hospital course remarkable for persistent bowel obstruction, started on TPN, Decadron.  Palliative care following.  Now having bowel movements.  Small bowel SBO protocol shows nondilated colon with contrast on entire colon. NG tube removed on 2/22, started on clears, currently tolerating.   Today, patient reports feeling much better overall, able to tolerate liquids, denied any abdominal pain, distention seems to be improving.  Feels ready to be discharged in a day or 2.   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  Plans to DC on octreotide and TPN, including advancing liquid diet as tolerated No indications  for venting PEG at this time  Right-sided distal acute PE Currently on room air Lower EXTR Doppler positive for DVT Echo showed preserved EF S/p IV heparin--> p.o. Eliquis  Poor nutritional status/severe protein calorie malnutrition On TPN  Hypertension Blood pressure soft Hold losartan  GERD Continue PPI  Normocytic anemia/ anemia of chronic disease Currently hemoglobin stable  Goals of care Palliative care was following for goals of care .CODE STATUS DNR   Nutrition Problem: Moderate Malnutrition Etiology: acute illness    DVT prophylaxis:iv heparin apixaban (ELIQUIS) tablet 10 mg  apixaban (ELIQUIS) tablet 5 mg     Code Status: Limited: Do not attempt resuscitation (DNR) -DNR-LIMITED -Do Not Intubate/DNI   Family Communication: None at bedside   Patient status:Inpatient  Patient is from :Home  Anticipated discharge WJ:XBJY    Consultants: General Surgery, palliative care, oncology,IR  Procedures: None yet  Antimicrobials:  Anti-infectives (From admission, onward)    None         Objective: Vitals:   02/26/24 1329 02/26/24 2115 02/27/24 0609 02/27/24 1420  BP: 122/67 134/60 (!) 115/57 (!) 108/56  Pulse: 64 64 64 62  Resp: 16 18 20 18   Temp: 98.1 F (36.7 C) 98.3 F (36.8 C) 98.6 F (37 C) 98.1 F (36.7 C)  TempSrc: Oral Oral Oral Oral  SpO2: 96% 96% 92% 93%  Weight:      Height:        Intake/Output Summary (Last 24 hours) at 02/27/2024 1514 Last data filed at 02/27/2024 0300 Gross per 24 hour  Intake 1061.91 ml  Output --  Net 1061.91 ml   Filed Weights   02/19/24 0713 02/21/24 0500 02/26/24 0706  Weight: 56.1 kg 57.2 kg 58 kg    Examination:   General: NAD Cardiovascular:  S1, S2 present Respiratory: CTAB Abdomen: Soft, nontender, nondistended, bowel sounds present Musculoskeletal: No bilateral pedal edema noted Skin: Normal Psychiatry: Normal mood     Data Reviewed: I have personally reviewed following labs and  imaging studies  CBC: Recent Labs  Lab 02/23/24 0636 02/24/24 0601 02/25/24 0553 02/26/24 1026 02/27/24 0525  WBC 12.8* 10.8* 10.8* 10.9* 10.2  NEUTROABS 8.4* 7.5 7.4 7.8* 6.4  HGB 9.0* 8.7* 9.2* 9.8* 9.7*  HCT 27.6* 26.7* 28.8* 30.0* 30.4*  MCV 94.8 95.4 95.4 95.2 95.9  PLT 223 204 200 200 209   Basic Metabolic Panel: Recent Labs  Lab 02/21/24 0323 02/22/24 0545 02/23/24 0636 02/24/24 0601 02/25/24 0553 02/26/24 1026 02/27/24 0525  NA 136 137 133* 129* 135 134* 135  K 4.4 4.2 4.5 4.8 4.7 4.2 4.2  CL 103 104 101 96* 100 99 100  CO2 25 23 23 25 28 25 26   GLUCOSE 109* 107* 101* 128* 110* 127* 104*  BUN 38* 43* 34* 31* 32* 32* 35*  CREATININE 0.83 0.93 0.81 0.94 0.86 0.72 0.86  CALCIUM 8.5* 8.5* 8.3* 7.9* 8.4* 8.6* 8.5*  MG 2.0 2.1  --  2.1  --  1.9 2.0  PHOS 4.0 3.4  --  3.6  --  4.0 4.1     No results found for this or any previous visit (from the past 240 hours).   Radiology Studies: No results found.    Scheduled Meds:  apixaban  10 mg Oral BID   Followed by   Melene Muller ON 03/04/2024] apixaban  5 mg Oral BID   Chlorhexidine Gluconate Cloth  6 each Topical Daily   [START ON 02/28/2024] dexamethasone  4 mg Oral Daily   insulin aspart  0-9 Units Subcutaneous Q8H   octreotide  100 mcg Subcutaneous Q8H   pantoprazole  40 mg Oral Daily   sodium chloride flush  10-40 mL Intracatheter Q12H   Continuous Infusions:  TPN ADULT (ION) 70 mL/hr at 02/26/24 1825   TPN CYCLIC-ADULT (ION)       LOS: 23 days   Briant Cedar, MD [facility] P3/01/2024, 3:14 PM

## 2024-02-27 NOTE — Progress Notes (Signed)
 PHARMACY - TOTAL PARENTERAL NUTRITION CONSULT NOTE   Indication: SBO  Assessment: 40 yoF admitted 2/7 after 2 weeks of progressively worsening reflux and abdominal distention with n/v. 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 Plan for bowel rest +/- venting G-tube while patient undergoes chemotherapy to hopefully relieve obstruction. Pharmacy consulted for TPN management.  3/2 - request by providers to make TPN cyclic in preparation for discharge  Glucose / Insulin: no hx DM; CBG goal 100-180 -CBGs 110-162 in last 24hrs -3 units of insulin given in last 24 hours  Electrolytes:  WNL Renal: SCr stable WNL; BUN elevated Hepatic: 2/27 labs: - LFTs improving and now WNL, Alk Phos elevated but improving - Tbili remains stable WNL - TG WNL 2/24 I/O: Continues to have improving bowel function as noted 2/27 per GynOnc (flatus, loose stools) - NG removed 2/22 3/2: continued small Bms and flatus reported.  Tolerated liquids yesterday. - Stool: - - UOP: 1x unmeasured - no mIVF GI Imaging: - 2/7 CT a/p: distal SBO d/t diffuse peritoneal carcinomatosis, with transition point in RLQ, hepatic and retroperitoneal metastases; incidental finding of rectal intussusception - 2/11 AXR: persistent but significantly improved SB dilation when compared with prior - 2/13 AXR: Worsening small bowel obstruction.  - 2/14 MRI pelvis: Abnormal amorphous soft tissue throughout lower uterine segment and cervix with ileocolic and likely ovarian mets. - 2/25 CT abdomen pelvis: Persistent distal colonic intussusception with the sigmoid colon invaginated into the rectum. Appearance is similar to the previous CT from 02/04/2024 and may relate to underlying peritoneal tumor. Mild improvement in distal partial small bowel obstruction with enteric contrast passing into the mid colon. GI Surgeries / Procedures: NA  Central access: PICC TPN start date: 2/11  Nutritional Goals: Goal TPN rate is 70 mL/hr (provides 89 g of protein and  1717 kcals per day)  RD Assessment: Estimated Needs Total Energy Estimated Needs: 1625-1900 kcal Total Protein Estimated Needs: 80-95g Total Fluid Estimated Needs: 1.9L/day  Current Nutrition:  TPN, Diet advanced to clear liquid on 2/22 (patient reportedly unable to take more than small sips).  Patient to discuss possible PEG vent with family - does not appear to need the gastrostomy tube at present 3/1 Tolerated liquids 3/2 Diet advanced to full liquid  Plan:  Day 1 of Cyclic TPN: Cycle to 18 hours. Day 2: If tolerates 18 hour cycle then cycle to 12 hour. For consistency of cyclic rates, the 1 hour taper up and down will be half the rate of the cyclic rate. Electrolytes in TPN:  Na - 120 mEq/L K - 10 mEq/L Ca - 3 mEq/L Mg - 5 mEq/L Phos - 15 mmol/L Cl:Ac ratio - 1:1 Add standard MVI and trace elements to TPN Continue to hold chromium for national backorder mIVF per MD. Continue sSSI q8h. Monitor TPN labs on Mon/Thurs.    Thank you for allowing pharmacy to be a part of this patient's care.  Selinda Eon, PharmD, BCPS Clinical Pharmacist [facility] 02/27/2024 10:44 AM

## 2024-02-27 NOTE — Progress Notes (Signed)
 Mobility Specialist - Progress Note   02/27/24 0844  Mobility  Activity Ambulated with assistance in hallway  Level of Assistance Modified independent, requires aide device or extra time  Assistive Device Front wheel walker  Distance Ambulated (ft) 500 ft  Activity Response Tolerated well  Mobility Referral Yes  Mobility visit 1 Mobility  Mobility Specialist Start Time (ACUTE ONLY) 0830  Mobility Specialist Stop Time (ACUTE ONLY) 0843  Mobility Specialist Time Calculation (min) (ACUTE ONLY) 13 min   Pt received in bed and agreeable to mobility. No complaints during session. Pt to bathroom after session with all needs met.    [facility]

## 2024-02-28 ENCOUNTER — Other Ambulatory Visit (HOSPITAL_COMMUNITY): Payer: Self-pay

## 2024-02-28 ENCOUNTER — Telehealth (HOSPITAL_COMMUNITY): Payer: Self-pay | Admitting: Pharmacy Technician

## 2024-02-28 ENCOUNTER — Encounter: Payer: Self-pay | Admitting: Oncology

## 2024-02-28 ENCOUNTER — Other Ambulatory Visit: Payer: Self-pay | Admitting: Oncology

## 2024-02-28 DIAGNOSIS — C786 Secondary malignant neoplasm of retroperitoneum and peritoneum: Secondary | ICD-10-CM | POA: Diagnosis not present

## 2024-02-28 DIAGNOSIS — C801 Malignant (primary) neoplasm, unspecified: Secondary | ICD-10-CM | POA: Diagnosis not present

## 2024-02-28 DIAGNOSIS — K56609 Unspecified intestinal obstruction, unspecified as to partial versus complete obstruction: Secondary | ICD-10-CM

## 2024-02-28 DIAGNOSIS — C787 Secondary malignant neoplasm of liver and intrahepatic bile duct: Secondary | ICD-10-CM | POA: Diagnosis not present

## 2024-02-28 DIAGNOSIS — Z515 Encounter for palliative care: Secondary | ICD-10-CM | POA: Diagnosis not present

## 2024-02-28 DIAGNOSIS — Z7189 Other specified counseling: Secondary | ICD-10-CM | POA: Diagnosis not present

## 2024-02-28 LAB — CBC WITH DIFFERENTIAL/PLATELET
Abs Immature Granulocytes: 0.32 10*3/uL — ABNORMAL HIGH (ref 0.00–0.07)
Basophils Absolute: 0 10*3/uL (ref 0.0–0.1)
Basophils Relative: 0 %
Eosinophils Absolute: 0 10*3/uL (ref 0.0–0.5)
Eosinophils Relative: 0 %
HCT: 30.7 % — ABNORMAL LOW (ref 36.0–46.0)
Hemoglobin: 9.7 g/dL — ABNORMAL LOW (ref 12.0–15.0)
Immature Granulocytes: 4 %
Lymphocytes Relative: 23 %
Lymphs Abs: 1.7 10*3/uL (ref 0.7–4.0)
MCH: 31.1 pg (ref 26.0–34.0)
MCHC: 31.6 g/dL (ref 30.0–36.0)
MCV: 98.4 fL (ref 80.0–100.0)
Monocytes Absolute: 0.7 10*3/uL (ref 0.1–1.0)
Monocytes Relative: 10 %
Neutro Abs: 4.5 10*3/uL (ref 1.7–7.7)
Neutrophils Relative %: 63 %
Platelets: 186 10*3/uL (ref 150–400)
RBC: 3.12 MIL/uL — ABNORMAL LOW (ref 3.87–5.11)
RDW: 14.4 % (ref 11.5–15.5)
WBC: 7.3 10*3/uL (ref 4.0–10.5)
nRBC: 0 % (ref 0.0–0.2)

## 2024-02-28 LAB — TRIGLYCERIDES: Triglycerides: 83 mg/dL (ref <150)

## 2024-02-28 LAB — COMPREHENSIVE METABOLIC PANEL
ALT: 25 U/L (ref 0–44)
AST: 19 U/L (ref 15–41)
Albumin: 2.8 g/dL — ABNORMAL LOW (ref 3.5–5.0)
Alkaline Phosphatase: 134 U/L — ABNORMAL HIGH (ref 38–126)
Anion gap: 7 (ref 5–15)
BUN: 33 mg/dL — ABNORMAL HIGH (ref 8–23)
CO2: 26 mmol/L (ref 22–32)
Calcium: 8.3 mg/dL — ABNORMAL LOW (ref 8.9–10.3)
Chloride: 102 mmol/L (ref 98–111)
Creatinine, Ser: 0.79 mg/dL (ref 0.44–1.00)
GFR, Estimated: >60 mL/min (ref >60)
Glucose, Bld: 102 mg/dL — ABNORMAL HIGH (ref 70–99)
Potassium: 3.7 mmol/L (ref 3.5–5.1)
Sodium: 135 mmol/L (ref 135–145)
Total Bilirubin: 0.4 mg/dL (ref 0.0–1.2)
Total Protein: 5.7 g/dL — ABNORMAL LOW (ref 6.5–8.1)

## 2024-02-28 LAB — GLUCOSE, CAPILLARY
Glucose-Capillary: 116 mg/dL — ABNORMAL HIGH (ref 70–99)
Glucose-Capillary: 123 mg/dL — ABNORMAL HIGH (ref 70–99)
Glucose-Capillary: 130 mg/dL — ABNORMAL HIGH (ref 70–99)

## 2024-02-28 LAB — MAGNESIUM: Magnesium: 1.8 mg/dL (ref 1.7–2.4)

## 2024-02-28 LAB — PHOSPHORUS: Phosphorus: 3.3 mg/dL (ref 2.5–4.6)

## 2024-02-28 MED ORDER — MAGNESIUM FOR TPN
INJECTION | INTRAVENOUS | Status: AC
  Filled 2024-02-28: qty 912

## 2024-02-28 MED ORDER — PANTOPRAZOLE SODIUM 40 MG PO TBEC
40.0000 mg | DELAYED_RELEASE_TABLET | Freq: Every day | ORAL | 0 refills | Status: DC
  Filled 2024-02-28: qty 30, 30d supply, fill #0

## 2024-02-28 MED ORDER — DEXAMETHASONE 4 MG PO TABS
4.0000 mg | ORAL_TABLET | Freq: Every day | ORAL | 0 refills | Status: DC
  Filled 2024-02-28: qty 30, 30d supply, fill #0

## 2024-02-28 MED ORDER — ONDANSETRON 4 MG PO TBDP
4.0000 mg | ORAL_TABLET | Freq: Three times a day (TID) | ORAL | 0 refills | Status: DC | PRN
  Filled 2024-02-28: qty 20, 7d supply, fill #0

## 2024-02-28 MED ORDER — DEXAMETHASONE 4 MG PO TABS
4.0000 mg | ORAL_TABLET | Freq: Every day | ORAL | Status: DC
  Administered 2024-02-29 – 2024-03-01 (×2): 4 mg via ORAL
  Filled 2024-02-28 (×2): qty 1

## 2024-02-28 MED ORDER — OCTREOTIDE ACETATE 100 MCG/ML IJ SOLN
100.0000 ug | Freq: Three times a day (TID) | INTRAMUSCULAR | 0 refills | Status: DC
  Filled 2024-02-28: qty 90, 30d supply, fill #0

## 2024-02-28 MED ORDER — APIXABAN (ELIQUIS) VTE STARTER PACK (10MG AND 5MG)
ORAL_TABLET | ORAL | 0 refills | Status: DC
  Filled 2024-02-28: qty 74, 30d supply, fill #0

## 2024-02-28 MED ORDER — DEXAMETHASONE 2 MG PO TABS
ORAL_TABLET | ORAL | 0 refills | Status: AC
  Filled 2024-02-28: qty 10, 7d supply, fill #0

## 2024-02-28 MED ORDER — DEXAMETHASONE 4 MG PO TABS: 2.0000 mg | ORAL_TABLET | Freq: Every day | ORAL | Status: DC

## 2024-02-28 MED ORDER — DEXAMETHASONE 4 MG PO TABS: 4.0000 mg | ORAL_TABLET | Freq: Every day | ORAL | Status: DC

## 2024-02-28 NOTE — Progress Notes (Signed)
 PROGRESS NOTE  Tracy Preston  MVH:[id number] DOB: 1935/04/07 DOA: 02/04/2024 PCP: Marden Noble, MD (Inactive)   Brief Narrative: Patient is a 89 year old female with history of hypertension, breast cancer who presented with abdominal distention, vomiting for 2 weeks.  CT abdomen was concerning for new metastatic disease from possible GYN origin, SBO.  Further workup showed peritoneal carcinomatosis with likely GYN malignancy.  GYN oncology, general surgery consulted pelvic MRI showed advanced GYN malignancy.  CT chest with contrast showed distal pulmonary PE present with cor pulmonale, started on heparin drip.  Received first cycle of chemo on 2/15.  Hospital course remarkable for persistent bowel obstruction, started on TPN, Decadron.  Palliative care following.  Now having bowel movements.  Small bowel SBO protocol shows nondilated colon with contrast on entire colon. NG tube removed on 2/22, started on clears, currently tolerating.    Today, patient denies any new complaints, continues to improve overall.  Multiple family members in the room including Dr. Venetia Maxon her son-in-law, patient's daughter and husband.   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  Plans to DC on octreotide and TPN, including advancing diet as  tolerated No indications for venting PEG at this time  Right-sided distal acute PE Currently on room air Lower EXTR Doppler positive for DVT Echo showed preserved EF S/p IV heparin--> p.o. Eliquis  Poor nutritional status/severe protein calorie malnutrition On TPN  Hypertension Blood pressure soft Hold losartan  GERD Continue PPI  Normocytic anemia/ anemia of chronic disease Currently hemoglobin stable  Goals of care Palliative care was following for goals of care .CODE STATUS DNR   Nutrition Problem: Moderate Malnutrition Etiology: acute illness    DVT prophylaxis:iv heparin apixaban (ELIQUIS) tablet 10 mg  apixaban (ELIQUIS) tablet 5 mg     Code Status: Limited: Do not attempt resuscitation (DNR) -DNR-LIMITED -Do Not Intubate/DNI   Family Communication: None at bedside   Patient status:Inpatient  Patient is from :Home  Anticipated discharge WU:XLKG    Consultants: General Surgery, palliative care, oncology,IR  Procedures: None yet  Antimicrobials:  Anti-infectives (From admission, onward)    None         Objective: Vitals:   02/27/24 1420 02/27/24 2148 02/28/24 0529 02/28/24 1349  BP: (!) 108/56 116/60 (!) 117/59 103/64  Pulse: 62 (!) 59 65 60  Resp: 18 18 18 20   Temp: 98.1 F (36.7 C) (!) 97.3 F (36.3 C) 98.1 F (36.7 C) 98.1 F (36.7 C)  TempSrc: Oral Oral Oral Oral  SpO2: 93% 95% 92% 96%  Weight:   55.7 kg   Height:        Intake/Output Summary (Last 24 hours) at 02/28/2024 1637 Last data filed at 02/28/2024 1500 Gross per 24 hour  Intake 995.57 ml  Output --  Net 995.57 ml   Filed Weights   02/21/24 0500 02/26/24 0706 02/28/24 0529  Weight: 57.2 kg 58 kg 55.7 kg    Examination:  General: NAD Cardiovascular: S1, S2 present Respiratory: CTAB Abdomen: Soft, nontender, nondistended, bowel sounds present Musculoskeletal: No bilateral pedal edema noted Skin: Normal Psychiatry: Normal mood     Data Reviewed: I have  personally reviewed following labs and imaging studies  CBC: Recent Labs  Lab 02/24/24 0601 02/25/24 0553 02/26/24 1026 02/27/24 0525 02/28/24 0750  WBC 10.8* 10.8* 10.9* 10.2 7.3  NEUTROABS 7.5 7.4 7.8* 6.4 4.5  HGB 8.7* 9.2* 9.8* 9.7* 9.7*  HCT 26.7* 28.8* 30.0* 30.4* 30.7*  MCV 95.4 95.4 95.2 95.9 98.4  PLT 204 200 200 209 186   Basic Metabolic Panel: Recent Labs  Lab 02/22/24 0545 02/23/24 0636 02/24/24 0601 02/25/24 0553 02/26/24 1026 02/27/24 0525 02/28/24 0750  NA 137   < > 129* 135 134* 135 135  K 4.2   < > 4.8 4.7 4.2 4.2 3.7  CL 104   < > 96* 100 99 100 102  CO2 23   < > 25 28 25 26 26   GLUCOSE 107*   < > 128* 110* 127* 104* 102*  BUN 43*   < > 31* 32* 32* 35* 33*  CREATININE 0.93   < > 0.94 0.86 0.72 0.86 0.79  CALCIUM 8.5*   < > 7.9* 8.4* 8.6* 8.5* 8.3*  MG 2.1  --  2.1  --  1.9 2.0 1.8  PHOS 3.4  --  3.6  --  4.0 4.1 3.3   < > = values in this interval not displayed.     No results found for this or any previous visit (from the past 240 hours).   Radiology Studies: No results found.    Scheduled Meds:  apixaban  10 mg Oral BID   Followed by   Melene Muller ON 03/04/2024] apixaban  5 mg Oral BID   Chlorhexidine Gluconate Cloth  6 each Topical Daily   [START ON 03/05/2024] dexamethasone  2 mg Oral Daily   [START ON 02/29/2024] dexamethasone  4 mg Oral Daily   insulin aspart  0-9 Units Subcutaneous Q8H   octreotide  100 mcg Subcutaneous Q8H   pantoprazole  40 mg Oral Daily   sodium chloride flush  10-40 mL Intracatheter Q12H   Continuous Infusions:  TPN CYCLIC-ADULT (ION)       LOS: 24 days   Briant Cedar, MD [facility] P3/02/2024, 4:37 PM

## 2024-02-28 NOTE — Progress Notes (Signed)
   [facility] Liaison Note:  Notified by [facility] manager of patient/family request for [facility] Palliative services at home after discharge.   Please call with any hospice or outpatient palliative care related questions.   Thank you for the opportunity to participate in this patient's care.   Glenna Fellows, BSN, RN, OCN [employer] [phone]

## 2024-02-28 NOTE — Plan of Care (Signed)

## 2024-02-28 NOTE — Progress Notes (Signed)
 Noted patient tolerating liquids, on octreotide.  We have discussed with her/family on multiple occasions and patient wound not want surgery. Would be open to IR gastrostomy tube if needed. Plans for chemo next week. Surgery will sign off. Call as needed.   Hosie Spangle, PA-C [facility] Please see Amion for pager number during day hours 7:00am-4:30pm

## 2024-02-28 NOTE — Progress Notes (Signed)
 PHARMACY - TOTAL PARENTERAL NUTRITION CONSULT NOTE   Indication: SBO  Assessment: 26 yoF admitted 2/7 after 2 weeks of progressively worsening reflux and abdominal distention with n/v. 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 Plan for bowel rest +/- venting G-tube while patient undergoes chemotherapy to hopefully relieve obstruction. Pharmacy consulted for TPN management.  3/2 - request by providers to make TPN cyclic in preparation for discharge  Glucose / Insulin: no hx DM; CBG goal 100-180 -CBGs 111-128 in last 24hrs -1 unit of insulin given in last 24 hours  Electrolytes:  - K+ on low end of normal at 3.7 and magnesium at 1.8  - Others are WNL including CorrCa  Renal:  - SCr stable WNL; BUN elevated Hepatic: 2/27 labs: - LFTs WNL, Alk Phos elevated but improving - Tbili remains stable WNL - TG WNL 2/24 I/O: Continues to have improving bowel function as noted 2/27 per GynOnc (flatus, loose stools) - NG removed 2/22 Tolerated liquids yesterday. - Stool: none charted - UOP: none charted - no mIVF GI Imaging: - 2/7 CT a/p: distal SBO d/t diffuse peritoneal carcinomatosis, with transition point in RLQ, hepatic and retroperitoneal metastases; incidental finding of rectal intussusception - 2/11 AXR: persistent but significantly improved SB dilation when compared with prior - 2/13 AXR: Worsening small bowel obstruction.  - 2/14 MRI pelvis: Abnormal amorphous soft tissue throughout lower uterine segment and cervix with ileocolic and likely ovarian mets. - 2/25 CT abdomen pelvis: Persistent distal colonic intussusception with the sigmoid colon invaginated into the rectum. Appearance is similar to the previous CT from 02/04/2024 and may relate to underlying peritoneal tumor. Mild improvement in distal partial small bowel obstruction with enteric contrast passing into the mid colon. GI Surgeries / Procedures: NA  Central access: PICC TPN start date: 2/11  Nutritional Goals: Goal TPN rate is 70  mL/hr (provides 89 g of protein and 1717 kcals per day)  RD Assessment: Estimated Needs Total Energy Estimated Needs: 1625-1900 kcal Total Protein Estimated Needs: 80-95g Total Fluid Estimated Needs: 1.9L/day  Current Nutrition:  TPN, Diet advanced to clear liquid on 2/22 (patient reportedly unable to take more than small sips).  Patient to discuss possible PEG vent with family - does not appear to need the gastrostomy tube at present 3/1 Tolerated liquids 3/2 Diet advanced to full liquid  Plan:  Day 1 of Cyclic TPN: Cycle to 18 hours. Day 2: If tolerates 18 hour cycle then cycle to 12 hour. For consistency of cyclic rates, the 1 hour taper up and down will be half the rate of the cyclic rate. Electrolytes in TPN:  Na - 120 mEq/L K - in to 20 mEq/L Ca - 3 mEq/L Mg - inc to 8 mEq/L Phos - 15 mmol/L Cl:Ac ratio - 1:1 Max glucose inf rate is 7.38 mg/kg/min Add standard MVI and trace elements to TPN Continue to hold chromium for national backorder mIVF per MD. Continue sSSI q8h. BMP, magnesium, phosphorus with AM labs  Monitor TPN labs on Mon/Thurs.    Adalberto Cole, PharmD, BCPS 02/28/2024 11:30 AM

## 2024-02-28 NOTE — Progress Notes (Addendum)
 Tracy Preston   DOB:04-21-1935   EA#:[id number]      ASSESSMENT & PLAN:  1.  Diffuse peritoneal carcinomatosis with mild ascites Likely primary cervical, ovarian or endometrial carcinoma with liver mets and bilateral adnexal masses. - Newly diagnosed February 2025 - CT scan 02/04/2024 shows diffuse peritoneal carcinomatosis and mild ascites.  Repeat CT was done on 02/22/2024. - Pelvic ultrasound 02/05/2024 shows 2 cm heterogeneous area at the uterine fundus, endometrial malignancy not excluded. - S/p paracentesis 02/05/2024, 100 mL of yellow fluid removed. 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 -MRI pelvis and CT chest for staging done on 02/11/2024.  Shows abnormal soft tissue throughout the lower uterine and cervix could represent primary uterine/cervical carcinoma or mets. - S/p chemotherapy with carboplatin/Taxol initiated on 02/12/2024.  Tolerated well. - S/p Granix 300 daily x 5 completed 02/17/2024.  - GYN Onc following closely.  - Palliative team following - Okay for discharge to home from medical oncology viewpoint.  Will follow up closely as outpatient.  May proceed with cycle 2 of chemotherapy next week.  Will consider hospice at a later time if patient's condition worsens. - Medical oncology/Dr. Truett Perna following.   2.  Small bowel obstruction - Distal SBO with transition point in the RLQ   - Likely due to peritoneal carcinomatosis - NGT discontinued on 02/19/2024 - TPN remains ongoing - Currently on full liquid diet, tolerating well - S/p CT a/p 02/22/24.  Showed persistent distal colonic intussusception, similar to CT of 02/04/2024 and may relate to underlying peritoneal tumor.  There was mild improvement in SBO. - Venting G-tube, on hold at this time.  Can reevaluate at a later time if necessary. - Management per surgery   3.  Acute PE DVT, LLE - Per CT of the chest for staging purposes done  02/11/2024.  Showed incidental acute PE involving the distal right pulmonary artery and lobar and segmental RUL and RML pulmonary artery branches.  Small right pleural effusion. - Was on IV heparin and has now transitioned to Eliquis.  Continue dosing as ordered - Monitor closely for active bleeding   4.  Anemia, normocytic - HGB improving 9.7 today      - Transfuse PRBC for hemoglobin <7.0. - No transfusional intervention warranted at this time. -Monitor CBC with differential every 2 days    5.  Nausea/vomiting - Improved significantly - Administer antiemetics Compazine as needed and Zofran ordered   6.  Hypertension - BP stable  - Monitor BP counts - Primary team following        Code Status DNR-limited  Subjective:  Patient seen awake and alert laying in bed.  Multiple family members at bedside.  Reports that she feels good and has been tolerating full liquid diet well.  Admits to passing gas in the last 12 hours and small BM yesterday.  She is looking forward to going home.  Objective:  Vitals:   02/27/24 2148 02/28/24 0529  BP: 116/60 (!) 117/59  Pulse: (!) 59 65  Resp: 18 18  Temp: (!) 97.3 F (36.3 C) 98.1 F (36.7 C)  SpO2: 95% 92%     Intake/Output Summary (Last 24 hours) at 02/28/2024 1018 Last data filed at 02/28/2024 0500 Gross per 24 hour  Intake 947 ml  Output --  Net 947 ml     REVIEW OF SYSTEMS:   Constitutional: Denies fevers, chills or abnormal night sweats Eyes: Denies blurriness of vision, double vision  or watery eyes Ears, nose, mouth, throat, and face: Denies mucositis or sore throat Respiratory: Denies cough, dyspnea or wheezes Cardiovascular: Denies palpitation, chest discomfort or lower extremity swelling Gastrointestinal:  Denies nausea, heartburn or change in bowel habits Skin: Denies abnormal skin rashes Lymphatics: Denies new lymphadenopathy or easy bruising Neurological: Denies numbness, tingling or new weaknesses Behavioral/Psych:  Mood is stable, no new changes  All other systems were reviewed with the patient and are negative.  PHYSICAL EXAMINATION: ECOG PERFORMANCE STATUS: 2 - Symptomatic, <50% confined to bed  Vitals:   02/27/24 2148 02/28/24 0529  BP: 116/60 (!) 117/59  Pulse: (!) 59 65  Resp: 18 18  Temp: (!) 97.3 F (36.3 C) 98.1 F (36.7 C)  SpO2: 95% 92%   Filed Weights   02/21/24 0500 02/26/24 0706 02/28/24 0529  Weight: 126 lb 1.7 oz (57.2 kg) 127 lb 13.9 oz (58 kg) 122 lb 12.7 oz (55.7 kg)    GENERAL: alert, no distress and comfortable SKIN: skin color, texture, turgor are normal, no rashes or significant lesions EYES: normal, conjunctiva are pink and non-injected, sclera clear OROPHARYNX: no exudate, no erythema and lips, buccal mucosa, and tongue normal  NECK: supple, thyroid normal size, non-tender, without nodularity LYMPH: no palpable lymphadenopathy in the cervical, axillary or inguinal LUNGS: clear to auscultation and percussion with normal breathing effort HEART: regular rate & rhythm and no murmurs and no lower extremity edema ABDOMEN: abdomen soft, non-tender and normal bowel sounds MUSCULOSKELETAL: no cyanosis of digits and no clubbing  PSYCH: alert & oriented x 3 with fluent speech NEURO: no focal motor/sensory deficits   All questions were answered. The patient knows to call the clinic with any problems, questions or concerns.   The total time spent in the appointment was 40 minutes encounter with patient including review of chart and various tests results, discussions about plan of care and coordination of care plan  Dawson Bills, NP 02/28/2024 10:18 AM    Labs Reviewed:  Lab Results  Component Value Date   WBC 7.3 02/28/2024   HGB 9.7 (L) 02/28/2024   HCT 30.7 (L) 02/28/2024   MCV 98.4 02/28/2024   PLT 186 02/28/2024   Recent Labs    02/21/24 0323 02/22/24 0545 02/24/24 0601 02/25/24 0553 02/26/24 1026 02/27/24 0525 02/28/24 0750  NA 136   < > 129*   < >  134* 135 135  K 4.4   < > 4.8   < > 4.2 4.2 3.7  CL 103   < > 96*   < > 99 100 102  CO2 25   < > 25   < > 25 26 26   GLUCOSE 109*   < > 128*   < > 127* 104* 102*  BUN 38*   < > 31*   < > 32* 35* 33*  CREATININE 0.83   < > 0.94   < > 0.72 0.86 0.79  CALCIUM 8.5*   < > 7.9*   < > 8.6* 8.5* 8.3*  GFRNONAA >60   < > 59*   < > >60 >60 >60  PROT 5.6*  --  5.5*  --   --   --  5.7*  ALBUMIN 2.5*  --  2.4*  --   --   --  2.8*  AST 30  --  21  --   --   --  19  ALT 59*  --  37  --   --   --  25  ALKPHOS 231*  --  176*  --   --   --  134*  BILITOT 0.4  --  0.5  --   --   --  0.4   < > = values in this interval not displayed.    Studies Reviewed:  CT ABDOMEN PELVIS W CONTRAST Result Date: 02/22/2024 CLINICAL DATA:  Ovarian cancer, bowel obstruction, restaging. * Tracking Code: BO * EXAM: CT ABDOMEN AND PELVIS WITH CONTRAST TECHNIQUE: Multidetector CT imaging of the abdomen and pelvis was performed using the standard protocol following bolus administration of intravenous contrast. RADIATION DOSE REDUCTION: This exam was performed according to the departmental dose-optimization program which includes automated exposure control, adjustment of the mA and/or kV according to patient size and/or use of iterative reconstruction technique. CONTRAST:  OMNIPAQUE IOHEXOL 300 MG/ML  SOLN COMPARISON:  Abdominopelvic CT 02/04/2024, pelvic MRI 02/11/2024 and chest CT 02/11/2024. FINDINGS: Lower chest: Small right-greater-than-left pleural effusions with dependent atelectasis at both lung bases, new from 02/04/2024 and increased from 02/11/2024. Contrast filled and mildly distended distal esophagus. Hepatobiliary: Multiple hypoenhancing hepatic metastases are again noted. The largest lesion superior to the gallbladder is less well-defined on the current study and may be slightly smaller, measuring 1.4 cm on image 26/2 (previously 1.9 x 1.5 cm). Other lesions are grossly stable, including a lesion more superiorly in the  central right lobe measuring 1.4 cm on image 18/2. Small hepatic cysts are also noted. No evidence of gallstones, gallbladder wall thickening or biliary dilatation. Pancreas: Unremarkable. No pancreatic ductal dilatation or surrounding inflammatory changes. Spleen: Stable 8 mm low-density splenic lesion on image 24/2, likely a cyst. The spleen is normal in size without other focal abnormality. Adrenals/Urinary Tract: Both adrenal glands appear normal. No evidence of urinary tract calculus, suspicious renal lesion or hydronephrosis. The right renal pelvis is mildly dilated without secondary signs of ureteral obstruction. There are renal sinus cysts bilaterally. The bladder appears unremarkable for its degree of distention. Stomach/Bowel: Enteric contrast has passed into the mid colon. The stomach remains mildly distended. Small bowel distension has mildly improved. The colon remains decompressed. There is a persistent distal colonic intussusception with the sigmoid colon invaginated into the rectum. Appearance is similar to the previous CT from 02/04/2024. No evidence of bowel perforation. Vascular/Lymphatic: Small retroperitoneal and mesenteric lymph nodes appear unchanged. Mild aortic and branch vessel atherosclerosis without evidence of aneurysm or large vessel occlusion. The portal, superior mesenteric and splenic veins remain patent. Reproductive: Persistent ill-defined mass involving the cervix, measuring approximately 3.4 x 4.0 cm on image 62/2. There is mild distension of the endometrial cavity. A small peripherally calcified uterine fundal fibroid is noted. Persistent ill-defined adnexal masses bilaterally, measuring approximately 4.0 x 2.6 cm on the right (image 54/2) and 2.9 x 1.9 cm on the left (image 57/2). Other: Small to moderate volume of ascites, similar to previous CT. Diffuse omental nodularity may be minimally improved in the interval with a dominant component in the right mid abdomen measuring  3.8 x 2.3 cm on image 39/2. No extravasated enteric contrast or pneumoperitoneum. Musculoskeletal: No acute or significant osseous findings. Mild lumbar spondylosis. IMPRESSION: 1. Persistent distal colonic intussusception with the sigmoid colon invaginated into the rectum. Appearance is similar to the previous CT from 02/04/2024 and may relate to underlying peritoneal tumor. 2. Mild improvement in distal partial small bowel obstruction with enteric contrast passing into the mid colon. The colon remains decompressed. 3. Persistent ill-defined cervical and bilateral adnexal masses consistent with known ovarian cancer.  Associated peritoneal carcinomatosis may be minimally improved. No definite progressive disease. 4. Persistent hepatic metastases, one of which may be slightly smaller in the interval. 5. Small right-greater-than-left pleural effusions with dependent atelectasis at both lung bases, new from 02/04/2024 and increased from 02/11/2024. 6.  Aortic Atherosclerosis (ICD10-I70.0). Electronically Signed   By: Carey Bullocks M.D.   On: 02/22/2024 16:08   DG Abd Portable 1V-Small Bowel Obstruction Protocol-initial, 8 hr delay Result Date: 02/18/2024 CLINICAL DATA:  8 hour delay small bowel obstruction EXAM: PORTABLE ABDOMEN - 1 VIEW COMPARISON:  Abdominal x-ray 02/18/2024 FINDINGS: Enteric tube tip is in the fundus of the stomach with sidehole at the level of the gastroesophageal junction. Contrast is seen throughout nondilated colon to the level of the rectum. No dilated bowel loops are seen. IMPRESSION: 1. Enteric tube tip is in the fundus of the stomach with sidehole at the level of the gastroesophageal junction. Recommend advancement 8 cm. 2. Contrast is seen throughout nondilated colon to the level of the rectum. No evidence for bowel obstruction. Electronically Signed   By: Darliss Cheney M.D.   On: 02/18/2024 20:57   DG Abd Portable 1V-Small Bowel Protocol-Position Verification Result Date:  02/18/2024 CLINICAL DATA:  [id number] Encounter for imaging study to confirm nasogastric (NG) tube placement [id number] EXAM: PORTABLE ABDOMEN - 1 VIEW COMPARISON:  02/10/2024 abdominal radiograph FINDINGS: Enteric tube terminates in the proximal stomach. No dilated small bowel loops. No evidence of pneumatosis or pneumoperitoneum. Mild elevation of the left hemidiaphragm with clear lung bases. IMPRESSION: Enteric tube terminates in the proximal stomach. Electronically Signed   By: Delbert Phenix M.D.   On: 02/18/2024 13:42   ECHOCARDIOGRAM COMPLETE Result Date: 02/13/2024    ECHOCARDIOGRAM REPORT   Patient Name:   ANELLE PARLOW Date of Exam: 02/13/2024 Medical Rec #:  [id number]          Height:       64.0 in Accession #:    [id number]         Weight:       118.9 lb Date of Birth:  [birth year]          BSA:          1.568 m Patient Age:    88 years           BP:           142/72 mmHg Patient Gender: F                  HR:           71 bpm. Exam Location:  Inpatient Procedure: 2D Echo, Cardiac Doppler and Color Doppler (Both Spectral and Color            Flow Doppler were utilized during procedure). Indications:    Cardiomyopathy- Unspecified I42.9  History:        Patient has no prior history of Echocardiogram examinations.                 Risk Factors:Hypertension.  Sonographer:    Lucendia Herrlich RCS Referring Phys: [id number] Doreen Salvage AMIN IMPRESSIONS  1. Left ventricular ejection fraction, by estimation, is 65 to 70%. The left ventricle has normal function. The left ventricle has no regional wall motion abnormalities. Left ventricular diastolic parameters were normal.  2. Right ventricular systolic function is normal. The right ventricular size is normal. There is normal pulmonary artery systolic pressure. The estimated right ventricular systolic pressure is 23.8 mmHg.  3. The mitral  valve is grossly normal. No evidence of mitral valve regurgitation. No evidence of mitral stenosis.  4. The aortic valve is calcified.  Aortic valve regurgitation is trivial. Aortic valve sclerosis/calcification is present, without any evidence of aortic stenosis.  5. The inferior vena cava is normal in size with greater than 50% respiratory variability, suggesting right atrial pressure of 3 mmHg. FINDINGS  Left Ventricle: Left ventricular ejection fraction, by estimation, is 65 to 70%. The left ventricle has normal function. The left ventricle has no regional wall motion abnormalities. Strain imaging was not performed. The left ventricular internal cavity  size was normal in size. There is no left ventricular hypertrophy. Left ventricular diastolic parameters were normal. Right Ventricle: The right ventricular size is normal. No increase in right ventricular wall thickness. Right ventricular systolic function is normal. There is normal pulmonary artery systolic pressure. The tricuspid regurgitant velocity is 2.28 m/s, and  with an assumed right atrial pressure of 3 mmHg, the estimated right ventricular systolic pressure is 23.8 mmHg. Left Atrium: Left atrial size was normal in size. Right Atrium: Right atrial size was normal in size. Pericardium: There is no evidence of pericardial effusion. Mitral Valve: The mitral valve is grossly normal. No evidence of mitral valve regurgitation. No evidence of mitral valve stenosis. Tricuspid Valve: The tricuspid valve is grossly normal. Tricuspid valve regurgitation is trivial. No evidence of tricuspid stenosis. Aortic Valve: The aortic valve is calcified. Aortic valve regurgitation is trivial. Aortic regurgitation PHT measures 299 msec. Aortic valve sclerosis/calcification is present, without any evidence of aortic stenosis. Aortic valve mean gradient measures 9.0 mmHg. Aortic valve peak gradient measures 11.1 mmHg. Aortic valve area, by VTI measures 1.23 cm. Pulmonic Valve: The pulmonic valve was grossly normal. Pulmonic valve regurgitation is trivial. No evidence of pulmonic stenosis. Aorta: The aortic  root and ascending aorta are structurally normal, with no evidence of dilitation. Venous: The inferior vena cava is normal in size with greater than 50% respiratory variability, suggesting right atrial pressure of 3 mmHg. IAS/Shunts: The atrial septum is grossly normal. Additional Comments: 3D imaging was not performed. Mild ascites is present.  LEFT VENTRICLE PLAX 2D LVIDd:         4.00 cm   Diastology LVIDs:         2.60 cm   LV e' medial:    9.25 cm/s LV PW:         0.90 cm   LV E/e' medial:  6.4 LV IVS:        1.00 cm   LV e' lateral:   12.20 cm/s LVOT diam:     1.80 cm   LV E/e' lateral: 4.9 LV SV:         48 LV SV Index:   31 LVOT Area:     2.54 cm  RIGHT VENTRICLE             IVC RV S prime:     11.00 cm/s  IVC diam: 1.20 cm TAPSE (M-mode): 1.8 cm LEFT ATRIUM             Index        RIGHT ATRIUM           Index LA diam:        3.70 cm 2.36 cm/m   RA Area:     12.50 cm LA Vol (A2C):   29.1 ml 18.55 ml/m  RA Volume:   29.10 ml  18.55 ml/m LA Vol (A4C):   40.0 ml 25.50  ml/m LA Biplane Vol: 34.3 ml 21.87 ml/m  AORTIC VALVE                     PULMONIC VALVE AV Area (Vmax):    1.61 cm      PR End Diast Vel: 2.12 msec AV Area (Vmean):   1.25 cm AV Area (VTI):     1.23 cm AV Vmax:           166.67 cm/s AV Vmean:          138.000 cm/s AV VTI:            0.392 m AV Peak Grad:      11.1 mmHg AV Mean Grad:      9.0 mmHg LVOT Vmax:         105.50 cm/s LVOT Vmean:        67.950 cm/s LVOT VTI:          0.190 m LVOT/AV VTI ratio: 0.48 AI PHT:            299 msec  AORTA Ao Root diam: 3.10 cm Ao Asc diam:  2.80 cm MITRAL VALVE               TRICUSPID VALVE MV Area (PHT): 3.27 cm    TR Peak grad:   20.8 mmHg MV Decel Time: 232 msec    TR Vmax:        228.00 cm/s MV E velocity: 59.60 cm/s MV A velocity: 76.60 cm/s  SHUNTS MV E/A ratio:  0.78        Systemic VTI:  0.19 m                            Systemic Diam: 1.80 cm Lennie Odor MD Electronically signed by Lennie Odor MD Signature Date/Time: 02/13/2024/8:53:15  PM    Final    VAS [place] LOWER EXTREMITY VENOUS (DVT) Result Date: 02/12/2024  Lower Venous DVT Study Patient Name:  SCHELLY CHUBA  Date of Exam:   02/12/2024 Medical Rec #: [id number]           Accession #:    [id number] Date of Birth: September 13, 1935           Patient Gender: F Patient Age:   61 years Exam Location:  [facility] Procedure:      VAS [place] LOWER EXTREMITY VENOUS (DVT) Referring Phys: Stephania Fragmin --------------------------------------------------------------------------------  Indications: Pulmonary embolism.  Risk Factors: Newly diagnosed peritoneal carcinomatosis. Comparison Study: No previous exams Performing Technologist: Jody Hill RVT, RDMS  Examination Guidelines: A complete evaluation includes B-mode imaging, spectral Doppler, color Doppler, and power Doppler as needed of all accessible portions of each vessel. Bilateral testing is considered an integral part of a complete examination. Limited examinations for reoccurring indications may be performed as noted. The reflux portion of the exam is performed with the patient in reverse Trendelenburg.  +---------+---------------+---------+-----------+----------+--------------+ RIGHT    CompressibilityPhasicitySpontaneityPropertiesThrombus Aging +---------+---------------+---------+-----------+----------+--------------+ CFV      Full           Yes      No                                  +---------+---------------+---------+-----------+----------+--------------+ SFJ      Full                                                        +---------+---------------+---------+-----------+----------+--------------+  FV Prox  Full           Yes      Yes                                 +---------+---------------+---------+-----------+----------+--------------+ FV Mid   Full           Yes      Yes                                 +---------+---------------+---------+-----------+----------+--------------+ FV DistalFull            Yes      Yes                                 +---------+---------------+---------+-----------+----------+--------------+ PFV      Full                                                        +---------+---------------+---------+-----------+----------+--------------+ POP      Full           Yes      Yes                                 +---------+---------------+---------+-----------+----------+--------------+ PTV      Full                                                        +---------+---------------+---------+-----------+----------+--------------+ PERO     Full                                                        +---------+---------------+---------+-----------+----------+--------------+   +---------+---------------+---------+-----------+----------+--------------+ LEFT     CompressibilityPhasicitySpontaneityPropertiesThrombus Aging +---------+---------------+---------+-----------+----------+--------------+ CFV      Full           Yes      Yes                                 +---------+---------------+---------+-----------+----------+--------------+ SFJ      Full                                                        +---------+---------------+---------+-----------+----------+--------------+ FV Prox  Full           Yes      Yes                                 +---------+---------------+---------+-----------+----------+--------------+ FV Mid   Full  Yes      Yes                                 +---------+---------------+---------+-----------+----------+--------------+ FV DistalFull           Yes      Yes                                 +---------+---------------+---------+-----------+----------+--------------+ PFV      Full                                                        +---------+---------------+---------+-----------+----------+--------------+ POP      Full           Yes      Yes                                  +---------+---------------+---------+-----------+----------+--------------+ PTV      None           No       No                   Acute          +---------+---------------+---------+-----------+----------+--------------+ PERO     None           No       No                   Acute          +---------+---------------+---------+-----------+----------+--------------+     Summary: BILATERAL: -No evidence of popliteal cyst, bilaterally. RIGHT: - There is no evidence of deep vein thrombosis in the lower extremity.  LEFT: - Findings consistent with acute deep vein thrombosis involving the left peroneal veins, and left posterior tibial veins.   *See table(s) above for measurements and observations. Electronically signed by Carolynn Sayers on 02/12/2024 at 8:18:12 PM.    Final    CT CHEST W CONTRAST Addendum Date: 02/11/2024 ADDENDUM REPORT: 02/11/2024 15:54 ADDENDUM: Critical Value/emergent results were called by telephone at the time of interpretation on 02/11/2024 at 3:54 pm to provider Eugene Garnet, MD, who verbally acknowledged these results. Electronically Signed   By: Delbert Phenix M.D.   On: 02/11/2024 15:54   Result Date: 02/11/2024 CLINICAL DATA:  Peritoneal carcinomatosis. Chest staging. * Tracking Code: BO * EXAM: CT CHEST WITH CONTRAST TECHNIQUE: Multidetector CT imaging of the chest was performed during intravenous contrast administration. RADIATION DOSE REDUCTION: This exam was performed according to the departmental dose-optimization program which includes automated exposure control, adjustment of the mA and/or kV according to patient size and/or use of iterative reconstruction technique. CONTRAST:  75mL OMNIPAQUE IOHEXOL 300 MG/ML  SOLN COMPARISON:  02/04/2024 CT abdomen/pelvis. 02/08/2024 chest radiograph. FINDINGS: Cardiovascular: Normal heart size. No significant pericardial effusion/thickening. Left anterior descending coronary atherosclerosis. Right PICC terminates in the middle third  of the SVC. Atherosclerotic nonaneurysmal thoracic aorta. Incidental acute pulmonary embolism involving the distal right pulmonary artery and lobar and segmental right upper lobe and right middle lobe pulmonary artery branches. No saddle embolus. Normal caliber main pulmonary artery. RV/LV ratio 1.0. Mediastinum/Nodes: No significant thyroid nodules.  Fluid level in the mid to lower thoracic esophagus with enteric tube terminating in the proximal stomach. No right axillary adenopathy. Mild left axillary adenopathy up to 1.1 cm (series 2/image 61). No pathologically enlarged mediastinal or hilar nodes. Lungs/Pleura: No pneumothorax. Small layering right pleural effusion. No left pleural effusion. Calcified 1 cm posterior right lower lobe granuloma. Mild dependent bilateral lower lobe atelectasis. No acute consolidative airspace disease, lung masses or significant pulmonary nodules. Upper abdomen: Moderate upper abdominal ascites with nodular soft tissue caking in the visualized anterior left upper peritoneum, as seen on recent CT abdomen study. Several hypodense liver lesions scattered throughout the visualized liver, unchanged, largest 1.6 cm in the inferior liver on series 2/image 155. Musculoskeletal: No aggressive appearing focal osseous lesions. Mild thoracic spondylosis. IMPRESSION: 1. Incidental acute pulmonary embolism involving the distal right pulmonary artery and lobar and segmental right upper lobe and right middle lobe pulmonary artery branches. No saddle embolus. Normal caliber main pulmonary artery. Borderline elevated RV/LV Ratio = 1.0. 2. Small layering right pleural effusion. Mild dependent bilateral lower lobe atelectasis. 3. Mild left axillary adenopathy, cannot exclude metastatic disease. No other evidence of metastatic disease in the chest. 4. One vessel coronary atherosclerosis. 5. Moderate upper abdominal ascites with nodular soft tissue caking in the visualized anterior left upper peritoneum,  as seen on recent CT abdomen study, compatible with peritoneal carcinomatosis. Several hypodense liver lesions scattered throughout the visualized liver, unchanged, suspicious for liver metastases. Electronically Signed: By: Delbert Phenix M.D. On: 02/11/2024 15:28   MR PELVIS W WO CONTRAST Result Date: 02/11/2024 CLINICAL DATA:  "Occult malignancy". Peritoneal carcinomatosis on abdominopelvic CT with bowel obstruction. EXAM: MRI PELVIS WITHOUT AND WITH CONTRAST TECHNIQUE: Multiplanar multisequence MR imaging of the pelvis was performed both before and after administration of intravenous contrast. CONTRAST:  5mL GADAVIST GADOBUTROL 1 MMOL/ML IV SOLN COMPARISON:  CT of 02/04/2024 FINDINGS: Urinary Tract: Primarily decompressed urinary bladder. Left renal sinus cysts without hydronephrosis. Bowel: Fluid-filled small bowel loops within the upper and mid pelvis measure up to 2.0 cm on 19/17. Improved from 3.4 cm on the prior exam. Again identified is an intussusception of the sigmoid into the rectum. There is mild hyperenhancement within the lead point including on 41/17, without restricted diffusion in this area. Vascular/Lymphatic: Ileocolic mesenteric adenopathy including at 2.2 x 1.8 cm on 08/17. No pelvic sidewall adenopathy. Reproductive: The endometrium is thickened for age including at 1.0 cm on 18/4. This is followed to the level of the cervix and lower uterine segment, where soft tissue fullness is again identified including on 18/4 and 21/3. Correlate restricted diffusion on 22/11. Example 3.7 x 3.7 cm on 31/17. There is amorphous soft tissue fullness within both adnexa, greater right than left. Example restricted diffusion on 17/11. The right adnexal soft tissue fullness measures 3.3 x 3.3 cm on 18/17. No separate ovarian tissue identified. Other: Peritoneal carcinomatosis, as evidenced by diffuse peritoneal enhancement and restricted diffusion (example 22/11). Small volume pelvic fluid. Musculoskeletal: No  acute osseous abnormality. IMPRESSION: 1. Abnormal amorphous soft tissue throughout lower uterine segment and cervix could represent primary uterine/cervical carcinoma or metastatic disease. 2. Diffuse peritoneal carcinomatosis as better imaged on dedicated CT. Ileocolic mesenteric nodal metastasis. 3. Right greater than left adnexal soft tissue fullness likely represents ovarian metastasis. 4. Chronic or recurrent sigmoid/rectal intussusception since 02/04/2024. Vague hyperenhancement in the region of the lead point, without dominant mass. Recommend physical exam correlation to exclude either primary rectosigmoid carcinoma or serosal sigmoid implant lead point. 5. Small  volume pelvic fluid. 6. Improved small bowel dilatation since prior CT. Electronically Signed   By: Jeronimo Greaves M.D.   On: 02/11/2024 15:42   DG Abd 1 View Result Date: 02/10/2024 CLINICAL DATA:  84132 Abdominal distention [id number] SBO (small bowel obstruction) (HCC) [id number] EXAM: ABDOMEN - 1 VIEW COMPARISON:  02/08/2024 FINDINGS: NG tube terminates within the proximal stomach. Worsening small bowel dilation in the mid abdomen measuring up to 5.3 cm (previously 3.3 cm. Paucity of bowel gas within the colon. No gross free intraperitoneal air. IMPRESSION: 1. Worsening small bowel obstruction. 2. NG tube terminates within the proximal stomach. Recommend advancement. Electronically Signed   By: Duanne Guess D.O.   On: 02/10/2024 09:35   DG Chest 1 View Result Date: 02/08/2024 CLINICAL DATA:  PICC line placement EXAM: CHEST  1 VIEW COMPARISON:  02/05/2024 FINDINGS: Right PICC line in place with the tip in the SVC. NG tube is in the stomach. Heart and mediastinal contours within normal limits. No confluent opacities or effusions. No acute bony abnormality. IMPRESSION: Right PICC line tip in the SVC. No active cardiopulmonary disease. Electronically Signed   By: Charlett Nose M.D.   On: 02/08/2024 17:51   [place] EKG SITE RITE Result Date:  02/08/2024 If Site Rite image not attached, placement could not be confirmed due to current cardiac rhythm.  DG Abd Portable 1V Result Date: 02/08/2024 CLINICAL DATA:  Check gastric catheter placement EXAM: PORTABLE ABDOMEN - 1 VIEW COMPARISON:  02/04/2024 FINDINGS: Scattered large and small bowel gas is noted. Mild small bowel dilatation is noted significantly improved when compared with the prior exam. Gastric catheter is noted with the tip in the stomach. No free air is noted. IMPRESSION: Persistent but significantly improved small bowel dilatation when compared with the prior exam. Electronically Signed   By: Alcide Clever M.D.   On: 02/08/2024 09:34   US Paracentesis Result Date: 02/05/2024 INDICATION: 89 year old with new abdominal distention, ascites. Request made for diagnostic and therapeutic paracentesis. EXAM: ULTRASOUND GUIDED DIAGNOSTIC  PARACENTESIS MEDICATIONS: 10 mL 1% lidocaine. COMPLICATIONS: None immediate. PROCEDURE: Informed written consent was obtained from the patient after a discussion of the risks, benefits and alternatives to treatment. A timeout was performed prior to the initiation of the procedure. Initial ultrasound scanning demonstrates a small amount of ascites within the left lateral abdomen interlooped with bowel. The left lateral abdomen was prepped and draped in the usual sterile fashion. 1% lidocaine was used for local anesthesia. Following this, a 19 gauge, 7-cm, Yueh catheter was introduced. An ultrasound image was saved for documentation purposes. The paracentesis was performed. The catheter was removed and a dressing was applied. The patient tolerated the procedure well without immediate post procedural complication. FINDINGS: A total of approximately 100 mL of yellow fluid was removed. Samples were sent to the laboratory as requested by the clinical team. IMPRESSION: Successful ultrasound-guided paracentesis yielding 100 mL of peritoneal fluid. Performed by: Loyce Dys PA-C Electronically Signed   By: Malachy Moan M.D.   On: 02/05/2024 14:31   US PELVIC COMPLETE WITH TRANSVAGINAL Result Date: 02/05/2024 CLINICAL DATA:  89 year old female with ascites, peritoneal carcinomatosis on CT Abdomen and Pelvis yesterday. EXAM: TRANSABDOMINAL AND TRANSVAGINAL ULTRASOUND OF PELVIS TECHNIQUE: Both transabdominal and transvaginal ultrasound examinations of the pelvis were performed. Transabdominal technique was performed for global imaging of the pelvis including uterus, ovaries, adnexal regions, and pelvic cul-de-sac. It was necessary to proceed with endovaginal exam following the transabdominal exam to visualize the ovaries.  COMPARISON:  CT Abdomen and Pelvis 02/04/2024. FINDINGS: Uterus Measurements: 6.9 x 3.9 x 3.9 cm = volume: 41 mL. No obvious myometrial mass. Endometrium Heterogeneous hypoechoic area at the fundus endometrium (image 49 of series 1). This area encompasses about 2 cm (image 55). See also cine series 3 images. Otherwise the echogenic endometrial stripe is up to 5 mm. Right ovary Measurements: 3.5 x 2.1 x 2.8 cm = volume: 11 mL. No ovarian mass identified. Left ovary Measurements: 2.3 x 1.5 x 1.7 cm = volume: 3 mL. No ovarian mass identified. Other findings Small volume ascites again visible in the pelvis. IMPRESSION: 1. No ovarian mass identified by ultrasound. 2. There is a 2 cm heterogeneous area at the uterine fundus which is favored to be endometrial related. Elsewhere the endometrial stripe is about 5 mm. An Endometrial Malignancy is not excluded. 3. Ascites, in conjunction with the extensive additional abdominal and pelvic abnormalities demonstrated by CT yesterday. Electronically Signed   By: Odessa Fleming M.D.   On: 02/05/2024 12:17   DG Chest Portable 1 View Result Date: 02/05/2024 CLINICAL DATA:  NG tube replacement confirmation EXAM: PORTABLE CHEST - 1 VIEW COMPARISON:  02/04/2024 FINDINGS: Cardiomediastinal silhouette and pulmonary vasculature are  within normal limits. Lungs are clear. Nasogastric tube extends to the left upper quadrant the side hole located at the level of the gastroesophageal junction. It is slightly retracted compared to prior exam. IMPRESSION: Nasogastric tube terminates in the left upper quadrant in the expected site of the stomach. The side hole of the NG tube is located at the level of the gastroesophageal junction. Electronically Signed   By: Acquanetta Belling M.D.   On: 02/05/2024 07:38   DG Chest Portable 1 View Result Date: 02/04/2024 CLINICAL DATA:  NG tube placement. Abdominal discomfort, heartburn, and nausea. EXAM: PORTABLE CHEST 1 VIEW COMPARISON:  None Available. FINDINGS: The heart size and mediastinal contours are within normal limits. There is atherosclerotic calcification of the aorta. No consolidation, effusion, or pneumothorax. An enteric tube terminates in the stomach. No acute osseous abnormality. IMPRESSION: 1. No active disease. 2. Enteric tube terminates in the stomach. Electronically Signed   By: Thornell Sartorius M.D.   On: 02/04/2024 21:20   CT ABDOMEN PELVIS W CONTRAST Result Date: 02/04/2024 CLINICAL DATA:  Abdominal pain, bloating, nausea and vomiting for 2 weeks. * Tracking Code: BO * EXAM: CT ABDOMEN AND PELVIS WITH CONTRAST TECHNIQUE: Multidetector CT imaging of the abdomen and pelvis was performed using the standard protocol following bolus administration of intravenous contrast. RADIATION DOSE REDUCTION: This exam was performed according to the departmental dose-optimization program which includes automated exposure control, adjustment of the mA and/or kV according to patient size and/or use of iterative reconstruction technique. CONTRAST:  OMNIPAQUE IOHEXOL 300 MG/ML  SOLN COMPARISON:  None Available. FINDINGS: Lower Chest: No acute findings. Hepatobiliary: A few tiny hepatic cysts are seen, however, there are multiple hypovascular masses involving the right and left hepatic lobes, largest in the  central right hepatic lobe measuring 1.9 x 1.5 cm on image 26/2. These are consistent with liver metastases. Gallbladder is unremarkable. No evidence of biliary ductal dilatation. Pancreas:  No mass or inflammatory changes. Spleen: Within normal limits in size and appearance. Adrenals/Urinary Tract: No suspicious masses identified. No evidence of ureteral calculi or hydronephrosis. Stomach/Bowel: Distal small bowel obstruction, with transition point in the right lower quadrant, likely due to peritoneal carcinoma. Rectal intussusception incidentally noted. Vascular/Lymphatic: Mild retroperitoneal lymphadenopathy in the left para-aortic and aortocaval spaces.  No acute vascular findings. Reproductive: A 1.6 cm peripherally calcified fibroid is seen in the uterine fundus. Poorly defined masslike soft tissue prominence is seen in the region of the cervix and lower uterine segment measuring 3.6 x 3.3 cm on image 63/2, possibly representing a primary cervical or endometrial carcinoma. Poorly defined soft tissue density is seen in both adnexal regions and along the uterine fundus, consistent with peritoneal carcinomatosis. Other: Mild ascites is seen. Peritoneal and omental soft tissue nodules and masses are seen throughout the abdomen and pelvis, consistent with peritoneal carcinomatosis. Largest conglomerate mass is seen in the right abdomen measuring approximately 6.7 x 3.4 cm on image 37/2. Musculoskeletal:  No suspicious bone lesions identified. IMPRESSION: Diffuse peritoneal carcinomatosis and mild ascites. Distal small bowel obstruction, with transition point in the right lower quadrant, likely due to peritoneal carcinomatosis. Masslike soft tissue prominence in the region of the lower uterine segment and cervix, possibly representing primary cervical or endometrial carcinoma. Liver metastases. Mild retroperitoneal lymphadenopathy, consistent with metastatic disease. Rectal intussusception incidentally noted. No  definite lead mass identified. Electronically Signed   By: Danae Orleans M.D.   On: 02/04/2024 11:34  Ms. Reisner was interviewed and examined.  She is now at day 17 following cycle 1 Taxol/carboplatin.  She feels well.  She tolerated a full liquid diet yesterday.  She reports little stool output yesterday, but continues to pass flatus.    Recommendations: 1.  Cycle TPN at night, arrange for home TPN 2.  Continue full liquid diet 3.  Continue octreotide 4.  Taper Decadron per palliative care medicine 5.   Apixaban anticoagulation 6.   Outpatient follow-up and cycle 2 Taxol/carboplatin at the cancer center on 03/06/2024

## 2024-02-28 NOTE — Telephone Encounter (Signed)
 Pharmacy Patient Advocate Encounter  Received notification from [facility] that Prior Authorization for Octreotide Acetate 100MCG/ML solution has been APPROVED from 02/28/2024 to 12/26/2024   PA #/Case ID/Reference #: [id number] Key: [id number]

## 2024-02-28 NOTE — Progress Notes (Signed)
 GYN Oncology Progress Note  Patient is alert, oriented, in no acute distress, resting in bed. States she has had a busy morning, seeing multiple people. She states her family has been planning for discharge home. Continues to tolerate diet. No nausea or emesis. Had a small bowel movement today and has been passing flatus. Abdominal distention is unchanged. Ambulating without difficulty. No needs voiced. Continue with current plan of care.

## 2024-02-28 NOTE — Progress Notes (Signed)
 Daily Progress Note   Patient Name: Tracy Preston       Date: 02/28/2024 DOB: 03-24-1935  Age: 89 y.o. MRN#: [id number] Attending Physician: Briant Cedar, MD Primary Care Physician: Marden Noble, MD (Inactive) Admit Date: 02/04/2024 Length of Stay: 24 days  Reason for Consultation/Follow-up: Establishing goals of care  Subjective:   Reviewed EMR prior to presenting to bedside.  Discussed care with hospitalist and TOC for updates.  Presented to bedside to see patient.  Patient seen sitting comfortably in bed.  Patient's family at bedside including patient's daughter with her husband and patient's other son-in-law, Dr. Venetia Maxon.  Able to introduce myself as a member of the palliative medicine team.  Discussed care plan for today.  TOC assisting with coordination of TPN continuation at night.  Patient planning to follow-up with Dr. Truett Perna next Monday for potential continuation of chemotherapy.  Discussed follow-up plans with palliative medicine team.  Spent time discussing patient's overall care planning moving forward.  Spent time answering questions regarding medication management, including steroid taper.  All questions answered at that time.  And able to speak with patient's son-in-law, Dr. Venetia Maxon, separately.  Discussed care planning moving forward.  Discussed avenues for care and supporting further discussions.  Having outpatient palliative at the cancer center as well as involving [facility] to have home palliative follow-up in case need to transition to hospice.  Patient has always opted for quality over quantity of life so this should be focus of patient's care moving forward.  Continuing to support patient's wishes for optimizing quality time.  Spent time answering questions as able  Returned to bedside and informed patient of [facility] palliative assistance as well and plan to follow-up with palliative medicine team at cancer center.  Again spent time answering questions as able.  Noted  palliative medicine team to continue following patient's medical journey.  Discussed care with outpatient palliative medicine providers to coordinate care. Also discussed with IDT during day.   Objective:   Vital Signs:  BP (!) 117/59 (BP Location: Right Arm)   Pulse 65   Temp 98.1 F (36.7 C) (Oral)   Resp 18   Ht 5\' 4"  (1.626 m)   Wt 55.7 kg   SpO2 92%   BMI 21.08 kg/m   Physical Exam:  General: NAD, alert, sitting in bed Cardiovascular: RRR, no edema in LE b/l Respiratory: no increased work of breathing noted, not in respiratory distress Neuro: A&Ox4, following commands easily Psych: appropriately answers all questions  Imaging: I personally reviewed recent imaging.   Assessment & Plan:   Assessment: Patient is a 89 year old female with past medical history of hypertension, CKD, and family history of breast cancer who was admitted on 02/04/2024 for management of abdominal distention and vomiting for approximately 2 weeks.  Imaging concerning for new metastatic ovarian cancer with severe small bowel obstruction.  Imaging also showed likely metastatic disease to liver and peritoneal carcinomatosis.  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  Gyn Onc and oncology consulted for recommendations.  Palliative medicine team consulted to assist with complex medical decision making.   Recommendations/Plan: # Complex medical decision making/goals of care: -Discussed care with patient with family at bedside as detailed above in HPI.  At this time continuing appropriate medical interventions.  Working with team to coordinate patient discharging this week.  Plan would be for patient to follow-up with Dr. Truett Perna in the outpatient setting regarding cancer directed therapies.  Patient would also plan to follow-up with outpatient palliative  medicine at cancer center.  Continuing to support conversations regarding pathways for medical care moving forward.  Patient has  voiced that should she have deterioration in her quality of life due to cancer therapy side effects, patient would want quality of life over quantity and it has been discussed patient can voice transition to hospice care at that time and focusing on comfort at the end of life.                -  Code Status: Limited: Do not attempt resuscitation (DNR) -DNR-LIMITED -Do Not Intubate/DNI    # Symptom management Patient is receiving these palliative interventions for symptom management with an intent to improve quality of life.                 -Nausea/vomiting in setting of severe malignant small bowel obstruction secondary to peritoneal carcinomatosis from ovarian cancer                              -Continue oral dexamethasone 4 mg x 5 days total with taper to oral dexamethasone 2 mg x 4 days total. Have placed orders for this.    -Continue scheduled octreotide 100 mcg every 8 subcu even at time of discharge   # Psycho-social/Spiritual Support:  - Support System: daughters, son-in-law, 8 grandchildren    # Discharge Planning:  To Be Determined  -Have already placed outpatient PMT referral to follow-[facility] at [facility].  Also placed TOC referral for coordination with [facility] palliative.  Discussed with: Patient, RN, oncology, hospitalist, TOC, [facility] liaison, outpatient palliative providers  Thank you for allowing the palliative care team to participate in the care [facility].  [name], DO Palliative Care Provider PMT # [phone]  If patient remains symptomatic despite maximum doses, please call PMT at [phone] between 0700 and 1900. Outside of these hours, please call attending, as PMT does not have night coverage.  Personally spent 50 minutes in patient care including extensive chart review (labs, imaging, progress/consult notes, vital signs), medically appropraite exam, discussed with treatment team, education to patient, family, and staff, documenting clinical information, medication  review and management, coordination of care, and available advanced directive documents.

## 2024-02-28 NOTE — TOC Progression Note (Signed)
 Transition of Care [facility]) - Progression Note    Patient Details  Name: Tracy Preston MRN: [id number] Date of Birth: [birth year]  [facility] CM/SW Contact  Beckie Busing, RN Phone Number:[phone]  02/28/2024, 12:17 PM  Clinical Narrative:    Patient expected to discharge home with TPN. Referral has been called to [facility] with [employer]. Pam to follow up on home TPN.   [employer] is in the process of obtaining insurance approval. Will continue to follow.    Expected Discharge Plan: Home/Self Care Barriers to Discharge: No Barriers Identified, Continued Medical Work up  Expected Discharge Plan and Services In-house Referral: NA Discharge Planning Services: CM Consult Post Acute Care Choice: NA Living arrangements for the past 2 months: Single Family Home                 DME Arranged: N/A DME Agency: NA         HH Agency: NA         Social Determinants of Health (SDOH) Interventions SDOH Screenings   Food Insecurity: No Food Insecurity (02/05/2024)  Housing: Low Risk  (02/05/2024)  Transportation Needs: No Transportation Needs (02/05/2024)  Utilities: Not At Risk (02/05/2024)  Social Connections: Socially Integrated (02/05/2024)  Tobacco Use: Unknown (02/04/2024)    Readmission Risk Interventions    02/23/2024   12:50 PM  Readmission Risk Prevention Plan  Transportation Screening Complete  PCP or Specialist Appt within 3-5 Days Complete  HRI or Home Care Consult Complete  Social Work Consult for Recovery Care Planning/Counseling Complete  Palliative Care Screening Not Applicable  Medication Review Oceanographer) Complete

## 2024-02-28 NOTE — Progress Notes (Signed)
 Mobility Specialist - Progress Note   02/28/24 0844  Mobility  Activity Ambulated with assistance in hallway  Level of Assistance Modified independent, requires aide device or extra time  Assistive Device Front wheel walker  Distance Ambulated (ft) 500 ft  Range of Motion/Exercises Active  Activity Response Tolerated well  Mobility Referral Yes  Mobility visit 1 Mobility  Mobility Specialist Start Time (ACUTE ONLY) 0831  Mobility Specialist Stop Time (ACUTE ONLY) 0844  Mobility Specialist Time Calculation (min) (ACUTE ONLY) 13 min   Pt was found in bed an agreeable to ambulate. Practiced ascending/descending 4 stairs due to having some at the entry way of home. At EOS returned to bed with all needs met. Call bell in reach.  Tracy Preston Mobility Specialist

## 2024-02-29 ENCOUNTER — Other Ambulatory Visit (HOSPITAL_COMMUNITY): Payer: Self-pay

## 2024-02-29 DIAGNOSIS — C786 Secondary malignant neoplasm of retroperitoneum and peritoneum: Secondary | ICD-10-CM | POA: Diagnosis not present

## 2024-02-29 LAB — BASIC METABOLIC PANEL
Anion gap: 8 (ref 5–15)
BUN: 36 mg/dL — ABNORMAL HIGH (ref 8–23)
CO2: 26 mmol/L (ref 22–32)
Calcium: 8 mg/dL — ABNORMAL LOW (ref 8.9–10.3)
Chloride: 101 mmol/L (ref 98–111)
Creatinine, Ser: 0.81 mg/dL (ref 0.44–1.00)
GFR, Estimated: >60 mL/min (ref >60)
Glucose, Bld: 95 mg/dL (ref 70–99)
Potassium: 3.9 mmol/L (ref 3.5–5.1)
Sodium: 135 mmol/L (ref 135–145)

## 2024-02-29 LAB — GLUCOSE, CAPILLARY
Glucose-Capillary: 89 mg/dL (ref 70–99)
Glucose-Capillary: 106 mg/dL — ABNORMAL HIGH (ref 70–99)
Glucose-Capillary: 104 mg/dL — ABNORMAL HIGH (ref 70–99)
Glucose-Capillary: 203 mg/dL — ABNORMAL HIGH (ref 70–99)

## 2024-02-29 LAB — MAGNESIUM: Magnesium: 2 mg/dL (ref 1.7–2.4)

## 2024-02-29 LAB — PHOSPHORUS: Phosphorus: 2.9 mg/dL (ref 2.5–4.6)

## 2024-02-29 MED ORDER — NYSTATIN 100000 UNIT/ML MT SUSP
5.0000 mL | Freq: Four times a day (QID) | OROMUCOSAL | Status: DC
  Administered 2024-02-29 – 2024-03-01 (×5): 500000 [IU] via ORAL
  Filled 2024-02-29 (×5): qty 5

## 2024-02-29 MED ORDER — NYSTATIN 100000 UNIT/ML MT SUSP
5.0000 mL | Freq: Four times a day (QID) | OROMUCOSAL | 0 refills | Status: DC
  Filled 2024-02-29: qty 60, 3d supply, fill #0

## 2024-02-29 MED ORDER — FLUCONAZOLE 100MG IVPB: 100.0000 mg | Freq: Once | INTRAVENOUS | Status: DC

## 2024-02-29 MED ORDER — TRAVASOL 10 % IV SOLN
INTRAVENOUS | Status: AC
  Filled 2024-02-29: qty 944

## 2024-02-29 MED ORDER — INSULIN ASPART 100 UNIT/ML IJ SOLN
0.0000 [IU] | INTRAMUSCULAR | Status: DC
  Administered 2024-02-29: 3 [IU] via SUBCUTANEOUS
  Administered 2024-03-01: 1 [IU] via SUBCUTANEOUS

## 2024-02-29 MED ORDER — FLUCONAZOLE 100 MG PO TABS
100.0000 mg | ORAL_TABLET | Freq: Once | ORAL | Status: AC
  Administered 2024-02-29: 100 mg via ORAL
  Filled 2024-02-29: qty 1

## 2024-02-29 NOTE — Progress Notes (Addendum)
 PROGRESS NOTE  Tracy Preston  ZOX:[id number] DOB: 23-Dec-1935 DOA: 02/04/2024 PCP: Marden Noble, MD (Inactive)   Brief Narrative: Patient is a 89 year old female with history of hypertension, breast cancer who presented with abdominal distention, vomiting for 2 weeks.  CT abdomen was concerning for new metastatic disease from possible GYN origin, SBO.  Further workup showed peritoneal carcinomatosis with likely GYN malignancy.  GYN oncology, general surgery consulted pelvic MRI showed advanced GYN malignancy.  CT chest with contrast showed distal pulmonary PE present with cor pulmonale, started on heparin drip.  Received first cycle of chemo on 2/15.  Hospital course remarkable for persistent bowel obstruction, started on TPN, Decadron.  Palliative care following.  Now having bowel movements.  Small bowel SBO protocol shows nondilated colon with contrast on entire colon. NG tube removed on 2/22, started on clears, currently tolerating.    Today, patient denied any new complaints.  Noted to have some oropharyngeal candidiasis likely 2/2 steroid use.   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  Plans to DC on octreotide and TPN (which will be required for duration of maximum 1 month), including advancing diet as  tolerated No indications for venting PEG at this time  Right-sided distal acute PE Currently on room air Lower EXTR Doppler positive for DVT Echo showed preserved EF S/p IV heparin--> p.o. Eliquis  Poor nutritional status/severe protein calorie malnutrition On TPN  Hypertension Blood pressure soft Hold losartan  GERD Continue PPI  Normocytic anemia/ anemia of chronic disease Currently hemoglobin stable  Goals of care Palliative care was following for goals of care .CODE STATUS DNR   Nutrition Problem: Moderate Malnutrition Etiology: acute illness    DVT prophylaxis:iv heparin apixaban (ELIQUIS) tablet 10 mg  apixaban (ELIQUIS) tablet 5 mg     Code Status: Limited: Do not attempt resuscitation (DNR) -DNR-LIMITED -Do Not Intubate/DNI   Family Communication: None at bedside   Patient status:Inpatient  Patient is from :Home  Anticipated discharge WJ:XBJY    Consultants: General Surgery, palliative care, oncology,IR  Procedures: None yet  Antimicrobials:  Anti-infectives (From admission, onward)    Start     Dose/Rate Route Frequency Ordered Stop   02/29/24 1045  fluconazole (DIFLUCAN) tablet 100 mg        100 mg Oral  Once 02/29/24 0957 02/29/24 1012   02/29/24 1030  fluconazole (DIFLUCAN) IVPB 100 mg  Status:  Discontinued        100 mg 50 mL/hr over 60 Minutes Intravenous  Once 02/29/24 0936 02/29/24 0957         Objective: Vitals:   02/28/24 0529 02/28/24 1349 02/29/24 0448 02/29/24 1448  BP: (!) 117/59 103/64 (!) 106/56 126/72  Pulse: 65 60 62 71  Resp: 18 20 16 15   Temp: 98.1 F (36.7 C) 98.1 F (36.7 C) 98.2 F (36.8 C) 97.7 F (36.5 C)  TempSrc: Oral Oral Oral Oral  SpO2: 92% 96% 95% 97%  Weight: 55.7 kg     Height:        Intake/Output Summary (Last 24 hours) at 02/29/2024 1716 Last data filed at 02/29/2024 1518 Gross per 24 hour  Intake 1902.02 ml  Output --  Net 1902.02 ml   Filed Weights   02/21/24 0500 02/26/24 0706  02/28/24 0529  Weight: 57.2 kg 58 kg 55.7 kg    Examination:   General: NAD Cardiovascular: S1, S2 present Respiratory: CTAB Abdomen: Soft, nontender, nondistended, bowel sounds present Musculoskeletal: No bilateral pedal edema noted Skin: Normal Psychiatry: Normal mood     Data Reviewed: I have personally reviewed following labs and imaging studies  CBC: Recent Labs  Lab 02/24/24 0601 02/25/24 0553 02/26/24 1026 02/27/24 0525 02/28/24 0750  WBC 10.8* 10.8* 10.9* 10.2 7.3  NEUTROABS 7.5 7.4 7.8* 6.4 4.5  HGB 8.7* 9.2* 9.8* 9.7* 9.7*  HCT 26.7* 28.8* 30.0* 30.4* 30.7*  MCV 95.4 95.4 95.2 95.9 98.4  PLT 204 200 200 209 186   Basic Metabolic Panel: Recent Labs  Lab 02/24/24 0601 02/25/24 0553 02/26/24 1026 02/27/24 0525 02/28/24 0750 02/29/24 0545  NA 129* 135 134* 135 135 135  K 4.8 4.7 4.2 4.2 3.7 3.9  CL 96* 100 99 100 102 101  CO2 25 28 25 26 26 26   GLUCOSE 128* 110* 127* 104* 102* 95  BUN 31* 32* 32* 35* 33* 36*  CREATININE 0.94 0.86 0.72 0.86 0.79 0.81  CALCIUM 7.9* 8.4* 8.6* 8.5* 8.3* 8.0*  MG 2.1  --  1.9 2.0 1.8 2.0  PHOS 3.6  --  4.0 4.1 3.3 2.9     No results found for this or any previous visit (from the past 240 hours).   Radiology Studies: No results found.    Scheduled Meds:  apixaban  10 mg Oral BID   Followed by   Melene Muller ON 03/04/2024] apixaban  5 mg Oral BID   Chlorhexidine Gluconate Cloth  6 each Topical Daily   [START ON 03/05/2024] dexamethasone  2 mg Oral Daily   dexamethasone  4 mg Oral Daily   insulin aspart  0-9 Units Subcutaneous 4 times per day on Sunday Monday Tuesday Wednesday Thursday Friday   nystatin  5 mL Oral QID   octreotide  100 mcg Subcutaneous Q8H   pantoprazole  40 mg Oral Daily   sodium chloride flush  10-40 mL Intracatheter Q12H   Continuous Infusions:  TPN CYCLIC-ADULT (ION)       LOS: 25 days   Briant Cedar, MD [facility] P3/03/2024, 5:16 PM

## 2024-02-29 NOTE — Progress Notes (Signed)
 Nutrition Follow-up  DOCUMENTATION CODES:   Non-severe (moderate) malnutrition in context of acute illness/injury  INTERVENTION:   -Cyclic TPN management per Pharmacy -Daily weights while on TPN  -PO as tolerated  NUTRITION DIAGNOSIS:   Moderate Malnutrition related to acute illness as evidenced by severe fat depletion, moderate fat depletion.  Ongoing.  GOAL:   Patient will meet greater than or equal to 90% of their needs  Meeting with TPN  MONITOR:   Diet advancement, Weight trends, Labs TPN  ASSESSMENT:   89 y.o. F, Presented to ED with complaints of abdominal distention and vomiting for the last 2 weeks. CT showed concerning findings and she was sent to ED for further evaluation. PMH; HTN, CKD.  2/7: admitted, NGT placed 2/8: s/p paracentesis, yield 100 ml 2/11: TPN initiation 2/15: chemotherapy 2/22: NGT removed, CLD started 3/2: FLD started  Patient now on full liquids. Holding off on venting G-tube. Now plan is for pt to discharge on TPN and full liquid diet.   TPN now cyclic in anticipation of discharge. Start rate at 73 mL/hr for 1 hours.  Increase rate to 145 mL/hr for 10 hours.  Decrease rate to 73 mL/hr for 1 hours, then stop.  Provides 1622 kcals and 94g protein.  Admission weight: 125 lbs Current weight: 122 lbs  Medications: Octreotide   Labs reviewed: CBGs: 89-130  Diet Order:   Diet Order             Diet full liquid Room service appropriate? Yes; Fluid consistency: Thin  Diet effective now                   EDUCATION NEEDS:   Education needs have been addressed  Skin:  Skin Assessment: Reviewed RN Assessment  Last BM:  3/3  Height:   Ht Readings from Last 1 Encounters:  02/11/24 5\' 4"  (1.626 m)    Weight:   Wt Readings from Last 1 Encounters:  02/28/24 55.7 kg   BMI:  Body mass index is 21.08 kg/m.  Estimated Nutritional Needs:   Kcal:  1625-1900 kcal  Protein:  80-95g  Fluid:  1.9L/day   Tilda Franco, MS, RD, LDN Inpatient Clinical Dietitian Contact via Secure chat

## 2024-02-29 NOTE — Progress Notes (Signed)
 PHARMACY - TOTAL PARENTERAL NUTRITION CONSULT NOTE   Indication: SBO  Assessment: 40 yoF admitted 2/7 after 2 weeks of progressively worsening reflux and abdominal distention with n/v. 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 Plan for bowel rest +/- venting G-tube while patient undergoes chemotherapy to hopefully relieve obstruction. Pharmacy consulted for TPN management.  3/2 - request by providers to make TPN cyclic in preparation for discharge  Glucose / Insulin: no hx DM; CBG goal 100-180 -CBGs 89-123 in last 24hrs -1 unit of insulin given in last 24 hours  Electrolytes:  - Others are WNL including CorrCa at 9.0 Renal:  - SCr stable WNL; BUN elevated but stable Hepatic: 3/3 - LFTs WNL, Alk Phos elevated but improving - Tbili remains stable WNL - TG WNL 2/24 I/O: Continues to have improving bowel function as noted 2/27 per GynOnc (flatus, loose stools) - NG removed 2/22 Tolerated liquids yesterday. - Stool: none charted - UOP: none charted - no mIVF GI Imaging: - 2/7 CT a/p: distal SBO d/t diffuse peritoneal carcinomatosis, with transition point in RLQ, hepatic and retroperitoneal metastases; incidental finding of rectal intussusception - 2/11 AXR: persistent but significantly improved SB dilation when compared with prior - 2/13 AXR: Worsening small bowel obstruction.  - 2/14 MRI pelvis: Abnormal amorphous soft tissue throughout lower uterine segment and cervix with ileocolic and likely ovarian mets. - 2/25 CT abdomen pelvis: Persistent distal colonic intussusception with the sigmoid colon invaginated into the rectum. Appearance is similar to the previous CT from 02/04/2024 and may relate to underlying peritoneal tumor. Mild improvement in distal partial small bowel obstruction with enteric contrast passing into the mid colon. GI Surgeries / Procedures: NA  Central access: PICC TPN start date: 2/11  Nutritional Goals: Goal cyclic TPN rate is 73-145 mL/hr over  12 hours (provides 94 g of  protein and 1622 kcals per day) - slight adjustment done to being glucose infusion rate down   RD Assessment: Estimated Needs Total Energy Estimated Needs: 1625-1900 kcal Total Protein Estimated Needs: 80-95g Total Fluid Estimated Needs: 1.9L/day  Current Nutrition:  TPN, Diet advanced to clear liquid on 2/22 (patient reportedly unable to take more than small sips).  Patient to discuss possible PEG vent with family - does not appear to need the gastrostomy tube at present 3/1 Tolerated liquids 3/2 Diet advanced to full liquid  Plan:  For consistency of cyclic rates, the 1 hour taper up and down will be half the rate of the cyclic rate. Electrolytes in TPN:  Na - 120 mEq/L K - in to 20 mEq/L Ca - 3 mEq/L Mg - inc to 8 mEq/L Phos -  inc to 20 mmol/L Cl:Ac ratio - 1:1 Max glucose inf rate is 7.38 mg/kg/min Add standard MVI and trace elements to TPN Continue to hold chromium for national backorder mIVF per MD. Adjust  sSSI to cyclic TPN monitoring  CBGs should be checked 2 hrs past cyclic TPN start + 1 hr past cyclic TPN       d/c + middle of TPN infusion while   off  TPN (total 4 CBGs/24 hr period) BMP, magnesium, phosphorus with AM labs  Monitor TPN labs on Mon/Thurs.    Adalberto Cole, PharmD, BCPS 02/29/2024 11:16 AM

## 2024-02-29 NOTE — Progress Notes (Addendum)
 Tracy Preston   DOB:July 19, 1935   WU#:[id number]      ASSESSMENT & PLAN:  1.  Diffuse peritoneal carcinomatosis with mild ascites Likely primary cervical, ovarian or endometrial carcinoma with liver mets and bilateral adnexal masses. - Newly diagnosed February 2025 - CT scan 02/04/2024 shows diffuse peritoneal carcinomatosis and mild ascites.  Repeat CT was done on 02/22/2024. - Pelvic ultrasound 02/05/2024 shows 2 cm heterogeneous area at the uterine fundus, endometrial malignancy not excluded. - S/p paracentesis 02/05/2024, 100 mL of yellow fluid removed. 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 -MRI pelvis and CT chest for staging done on 02/11/2024.  Shows abnormal soft tissue throughout the lower uterine and cervix could represent primary uterine/cervical carcinoma or mets. - S/p chemotherapy with carboplatin/Taxol initiated on 02/12/2024.  Tolerated well. - S/p Granix 300 daily x 5 completed 02/17/2024.  - GYN Onc following closely, continue octreotide.  - Palliative team following - Okay for discharge to home from medical oncology viewpoint. - Plan to proceed with cycle 2 of chemotherapy next week.  Patient has outpatient oncology visit on 03/06/2024. - The plan is to have home TPN at night.  Continue full liquid diet. - Will consider hospice at a later time if patient's condition worsens. - Medical oncology/Dr. Truett Perna following.   2.  Small bowel obstruction - Distal SBO with transition point in the RLQ   - Likely due to peritoneal carcinomatosis - NGT discontinued on 02/19/2024 - Status post TPN. - Currently on full liquid diet, tolerating well - S/p CT a/p 02/22/24.  Showed persistent distal colonic intussusception, similar to CT of 02/04/2024 and may relate to underlying peritoneal tumor.  There was mild improvement in SBO. - G-tube, on hold at this time.  Can reevaluate at a later time if necessary. -  Management per surgery   3.  Acute PE DVT, LLE - Per CT of the chest for staging purposes done 02/11/2024.  Showed incidental acute PE involving the distal right pulmonary artery and lobar and segmental RUL and RML pulmonary artery branches.  Small right pleural effusion. - Was on IV heparin and has now transitioned to Eliquis.  Continue dosing as ordered - Monitor closely for active bleeding   4.  Anemia, normocytic - HGB shows improvement 9.7 on 02/28/2024    - Transfuse PRBC for hemoglobin <7.0. - No transfusional intervention warranted at this time. -Monitor CBC with differential every 2 days, will repeat at outpatient visit.    5.  Nausea/vomiting - Improved significantly - Administer antiemetics Compazine as needed and Zofran ordered   6.  Hypertension - BP stable  - Monitor BP counts - Primary team following  7.  Oral mucositis - Ordered Diflucan 100 mg p.o. x 1 - Also ordered nystatin 4 times a day for 5 days, please ensure patient receives prescription prior to discharge.      Code Status DNR-limited  Subjective:  Patient seen awake and alert sitting in chair at bedside.  Reports that she feels good and is looking forward to going home.  Started meds for thrush which is all over her tongue.  Objective:  Vitals:   02/28/24 1349 02/29/24 0448  BP: 103/64 (!) 106/56  Pulse: 60 62  Resp: 20 16  Temp: 98.1 F (36.7 C) 98.2 F (36.8 C)  SpO2: 96% 95%     Intake/Output Summary (Last 24 hours) at 02/29/2024 1102 Last data filed at 02/29/2024 1000 Gross per 24 hour  Intake 1470.59 ml  Output --  Net 1470.59 ml     REVIEW OF SYSTEMS:   Constitutional: Denies fevers, chills or abnormal night sweats Eyes: Denies blurriness of vision, double vision or watery eyes Ears, nose, mouth, throat, and face: + mucositis  Respiratory: Denies cough, dyspnea or wheezes Cardiovascular: Denies palpitation, chest discomfort or lower extremity swelling Gastrointestinal: +Abdominal  distention Skin: Denies abnormal skin rashes Lymphatics: Denies new lymphadenopathy or easy bruising Neurological: Denies numbness, tingling or new weaknesses Behavioral/Psych: Mood is stable, no new changes  All other systems were reviewed with the patient and are negative.  PHYSICAL EXAMINATION: ECOG PERFORMANCE STATUS: 2 - Symptomatic, <50% confined to bed  Vitals:   02/28/24 1349 02/29/24 0448  BP: 103/64 (!) 106/56  Pulse: 60 62  Resp: 20 16  Temp: 98.1 F (36.7 C) 98.2 F (36.8 C)  SpO2: 96% 95%   Filed Weights   02/21/24 0500 02/26/24 0706 02/28/24 0529  Weight: 126 lb 1.7 oz (57.2 kg) 127 lb 13.9 oz (58 kg) 122 lb 12.7 oz (55.7 kg)    GENERAL: alert, no distress and comfortable SKIN: skin color, texture, turgor are normal, no rashes or significant lesions EYES: normal, conjunctiva are pink and non-injected, sclera clear OROPHARYNX: no exudate, no erythema and lips, buccal mucosa, and tongue normal  NECK: supple, thyroid normal size, non-tender, without nodularity LYMPH: no palpable lymphadenopathy in the cervical, axillary or inguinal LUNGS: clear to auscultation and percussion with normal breathing effort HEART: regular rate & rhythm and no murmurs and no lower extremity edema ABDOMEN: + Abdominal distention  MUSCULOSKELETAL: no cyanosis of digits and no clubbing  PSYCH: alert & oriented x 3 with fluent speech NEURO: no focal motor/sensory deficits   All questions were answered. The patient knows to call the clinic with any problems, questions or concerns.   The total time spent in the appointment was 40 minutes encounter with patient including review of chart and various tests results, discussions about plan of care and coordination of care plan  Dawson Bills, NP 02/29/2024 11:02 AM    Labs Reviewed:  Lab Results  Component Value Date   WBC 7.3 02/28/2024   HGB 9.7 (L) 02/28/2024   HCT 30.7 (L) 02/28/2024   MCV 98.4 02/28/2024   PLT 186 02/28/2024    Recent Labs    02/21/24 0323 02/22/24 0545 02/24/24 0601 02/25/24 0553 02/27/24 0525 02/28/24 0750 02/29/24 0545  NA 136   < > 129*   < > 135 135 135  K 4.4   < > 4.8   < > 4.2 3.7 3.9  CL 103   < > 96*   < > 100 102 101  CO2 25   < > 25   < > 26 26 26   GLUCOSE 109*   < > 128*   < > 104* 102* 95  BUN 38*   < > 31*   < > 35* 33* 36*  CREATININE 0.83   < > 0.94   < > 0.86 0.79 0.81  CALCIUM 8.5*   < > 7.9*   < > 8.5* 8.3* 8.0*  GFRNONAA >60   < > 59*   < > >60 >60 >60  PROT 5.6*  --  5.5*  --   --  5.7*  --   ALBUMIN 2.5*  --  2.4*  --   --  2.8*  --   AST 30  --  21  --   --  19  --  ALT 59*  --  37  --   --  25  --   ALKPHOS 231*  --  176*  --   --  134*  --   BILITOT 0.4  --  0.5  --   --  0.4  --    < > = values in this interval not displayed.    Studies Reviewed:  CT ABDOMEN PELVIS W CONTRAST Result Date: 02/22/2024 CLINICAL DATA:  Ovarian cancer, bowel obstruction, restaging. * Tracking Code: BO * EXAM: CT ABDOMEN AND PELVIS WITH CONTRAST TECHNIQUE: Multidetector CT imaging of the abdomen and pelvis was performed using the standard protocol following bolus administration of intravenous contrast. RADIATION DOSE REDUCTION: This exam was performed according to the departmental dose-optimization program which includes automated exposure control, adjustment of the mA and/or kV according to patient size and/or use of iterative reconstruction technique. CONTRAST:  OMNIPAQUE IOHEXOL 300 MG/ML  SOLN COMPARISON:  Abdominopelvic CT 02/04/2024, pelvic MRI 02/11/2024 and chest CT 02/11/2024. FINDINGS: Lower chest: Small right-greater-than-left pleural effusions with dependent atelectasis at both lung bases, new from 02/04/2024 and increased from 02/11/2024. Contrast filled and mildly distended distal esophagus. Hepatobiliary: Multiple hypoenhancing hepatic metastases are again noted. The largest lesion superior to the gallbladder is less well-defined on the current study and may be  slightly smaller, measuring 1.4 cm on image 26/2 (previously 1.9 x 1.5 cm). Other lesions are grossly stable, including a lesion more superiorly in the central right lobe measuring 1.4 cm on image 18/2. Small hepatic cysts are also noted. No evidence of gallstones, gallbladder wall thickening or biliary dilatation. Pancreas: Unremarkable. No pancreatic ductal dilatation or surrounding inflammatory changes. Spleen: Stable 8 mm low-density splenic lesion on image 24/2, likely a cyst. The spleen is normal in size without other focal abnormality. Adrenals/Urinary Tract: Both adrenal glands appear normal. No evidence of urinary tract calculus, suspicious renal lesion or hydronephrosis. The right renal pelvis is mildly dilated without secondary signs of ureteral obstruction. There are renal sinus cysts bilaterally. The bladder appears unremarkable for its degree of distention. Stomach/Bowel: Enteric contrast has passed into the mid colon. The stomach remains mildly distended. Small bowel distension has mildly improved. The colon remains decompressed. There is a persistent distal colonic intussusception with the sigmoid colon invaginated into the rectum. Appearance is similar to the previous CT from 02/04/2024. No evidence of bowel perforation. Vascular/Lymphatic: Small retroperitoneal and mesenteric lymph nodes appear unchanged. Mild aortic and branch vessel atherosclerosis without evidence of aneurysm or large vessel occlusion. The portal, superior mesenteric and splenic veins remain patent. Reproductive: Persistent ill-defined mass involving the cervix, measuring approximately 3.4 x 4.0 cm on image 62/2. There is mild distension of the endometrial cavity. A small peripherally calcified uterine fundal fibroid is noted. Persistent ill-defined adnexal masses bilaterally, measuring approximately 4.0 x 2.6 cm on the right (image 54/2) and 2.9 x 1.9 cm on the left (image 57/2). Other: Small to moderate volume of ascites,  similar to previous CT. Diffuse omental nodularity may be minimally improved in the interval with a dominant component in the right mid abdomen measuring 3.8 x 2.3 cm on image 39/2. No extravasated enteric contrast or pneumoperitoneum. Musculoskeletal: No acute or significant osseous findings. Mild lumbar spondylosis. IMPRESSION: 1. Persistent distal colonic intussusception with the sigmoid colon invaginated into the rectum. Appearance is similar to the previous CT from 02/04/2024 and may relate to underlying peritoneal tumor. 2. Mild improvement in distal partial small bowel obstruction with enteric contrast passing into the mid colon.  The colon remains decompressed. 3. Persistent ill-defined cervical and bilateral adnexal masses consistent with known ovarian cancer. Associated peritoneal carcinomatosis may be minimally improved. No definite progressive disease. 4. Persistent hepatic metastases, one of which may be slightly smaller in the interval. 5. Small right-greater-than-left pleural effusions with dependent atelectasis at both lung bases, new from 02/04/2024 and increased from 02/11/2024. 6.  Aortic Atherosclerosis (ICD10-I70.0). Electronically Signed   By: Carey Bullocks M.D.   On: 02/22/2024 16:08   DG Abd Portable 1V-Small Bowel Obstruction Protocol-initial, 8 hr delay Result Date: 02/18/2024 CLINICAL DATA:  8 hour delay small bowel obstruction EXAM: PORTABLE ABDOMEN - 1 VIEW COMPARISON:  Abdominal x-ray 02/18/2024 FINDINGS: Enteric tube tip is in the fundus of the stomach with sidehole at the level of the gastroesophageal junction. Contrast is seen throughout nondilated colon to the level of the rectum. No dilated bowel loops are seen. IMPRESSION: 1. Enteric tube tip is in the fundus of the stomach with sidehole at the level of the gastroesophageal junction. Recommend advancement 8 cm. 2. Contrast is seen throughout nondilated colon to the level of the rectum. No evidence for bowel obstruction.  Electronically Signed   By: Darliss Cheney M.D.   On: 02/18/2024 20:57   DG Abd Portable 1V-Small Bowel Protocol-Position Verification Result Date: 02/18/2024 CLINICAL DATA:  [id number] Encounter for imaging study to confirm nasogastric (NG) tube placement [id number] EXAM: PORTABLE ABDOMEN - 1 VIEW COMPARISON:  02/10/2024 abdominal radiograph FINDINGS: Enteric tube terminates in the proximal stomach. No dilated small bowel loops. No evidence of pneumatosis or pneumoperitoneum. Mild elevation of the left hemidiaphragm with clear lung bases. IMPRESSION: Enteric tube terminates in the proximal stomach. Electronically Signed   By: Delbert Phenix M.D.   On: 02/18/2024 13:42   ECHOCARDIOGRAM COMPLETE Result Date: 02/13/2024    ECHOCARDIOGRAM REPORT   Patient Name:   LLEWELLYN SCHOENBERGER Date of Exam: 02/13/2024 Medical Rec #:  [id number]          Height:       64.0 in Accession #:    [id number]         Weight:       118.9 lb Date of Birth:  09-25-1935          BSA:          1.568 m Patient Age:    88 years           BP:           142/72 mmHg Patient Gender: F                  HR:           71 bpm. Exam Location:  Inpatient Procedure: 2D Echo, Cardiac Doppler and Color Doppler (Both Spectral and Color            Flow Doppler were utilized during procedure). Indications:    Cardiomyopathy- Unspecified I42.9  History:        Patient has no prior history of Echocardiogram examinations.                 Risk Factors:Hypertension.  Sonographer:    Lucendia Herrlich RCS Referring Phys: [id number] Doreen Salvage AMIN IMPRESSIONS  1. Left ventricular ejection fraction, by estimation, is 65 to 70%. The left ventricle has normal function. The left ventricle has no regional wall motion abnormalities. Left ventricular diastolic parameters were normal.  2. Right ventricular systolic function is normal. The right ventricular size is normal. There is normal  pulmonary artery systolic pressure. The estimated right ventricular systolic pressure is 23.8 mmHg.   3. The mitral valve is grossly normal. No evidence of mitral valve regurgitation. No evidence of mitral stenosis.  4. The aortic valve is calcified. Aortic valve regurgitation is trivial. Aortic valve sclerosis/calcification is present, without any evidence of aortic stenosis.  5. The inferior vena cava is normal in size with greater than 50% respiratory variability, suggesting right atrial pressure of 3 mmHg. FINDINGS  Left Ventricle: Left ventricular ejection fraction, by estimation, is 65 to 70%. The left ventricle has normal function. The left ventricle has no regional wall motion abnormalities. Strain imaging was not performed. The left ventricular internal cavity  size was normal in size. There is no left ventricular hypertrophy. Left ventricular diastolic parameters were normal. Right Ventricle: The right ventricular size is normal. No increase in right ventricular wall thickness. Right ventricular systolic function is normal. There is normal pulmonary artery systolic pressure. The tricuspid regurgitant velocity is 2.28 m/s, and  with an assumed right atrial pressure of 3 mmHg, the estimated right ventricular systolic pressure is 23.8 mmHg. Left Atrium: Left atrial size was normal in size. Right Atrium: Right atrial size was normal in size. Pericardium: There is no evidence of pericardial effusion. Mitral Valve: The mitral valve is grossly normal. No evidence of mitral valve regurgitation. No evidence of mitral valve stenosis. Tricuspid Valve: The tricuspid valve is grossly normal. Tricuspid valve regurgitation is trivial. No evidence of tricuspid stenosis. Aortic Valve: The aortic valve is calcified. Aortic valve regurgitation is trivial. Aortic regurgitation PHT measures 299 msec. Aortic valve sclerosis/calcification is present, without any evidence of aortic stenosis. Aortic valve mean gradient measures 9.0 mmHg. Aortic valve peak gradient measures 11.1 mmHg. Aortic valve area, by VTI measures 1.23 cm.  Pulmonic Valve: The pulmonic valve was grossly normal. Pulmonic valve regurgitation is trivial. No evidence of pulmonic stenosis. Aorta: The aortic root and ascending aorta are structurally normal, with no evidence of dilitation. Venous: The inferior vena cava is normal in size with greater than 50% respiratory variability, suggesting right atrial pressure of 3 mmHg. IAS/Shunts: The atrial septum is grossly normal. Additional Comments: 3D imaging was not performed. Mild ascites is present.  LEFT VENTRICLE PLAX 2D LVIDd:         4.00 cm   Diastology LVIDs:         2.60 cm   LV e' medial:    9.25 cm/s LV PW:         0.90 cm   LV E/e' medial:  6.4 LV IVS:        1.00 cm   LV e' lateral:   12.20 cm/s LVOT diam:     1.80 cm   LV E/e' lateral: 4.9 LV SV:         48 LV SV Index:   31 LVOT Area:     2.54 cm  RIGHT VENTRICLE             IVC RV S prime:     11.00 cm/s  IVC diam: 1.20 cm TAPSE (M-mode): 1.8 cm LEFT ATRIUM             Index        RIGHT ATRIUM           Index LA diam:        3.70 cm 2.36 cm/m   RA Area:     12.50 cm LA Vol (A2C):   29.1 ml 18.55 ml/m  RA Volume:   29.10 ml  18.55 ml/m LA Vol (A4C):   40.0 ml 25.50 ml/m LA Biplane Vol: 34.3 ml 21.87 ml/m  AORTIC VALVE                     PULMONIC VALVE AV Area (Vmax):    1.61 cm      PR End Diast Vel: 2.12 msec AV Area (Vmean):   1.25 cm AV Area (VTI):     1.23 cm AV Vmax:           166.67 cm/s AV Vmean:          138.000 cm/s AV VTI:            0.392 m AV Peak Grad:      11.1 mmHg AV Mean Grad:      9.0 mmHg LVOT Vmax:         105.50 cm/s LVOT Vmean:        67.950 cm/s LVOT VTI:          0.190 m LVOT/AV VTI ratio: 0.48 AI PHT:            299 msec  AORTA Ao Root diam: 3.10 cm Ao Asc diam:  2.80 cm MITRAL VALVE               TRICUSPID VALVE MV Area (PHT): 3.27 cm    TR Peak grad:   20.8 mmHg MV Decel Time: 232 msec    TR Vmax:        228.00 cm/s MV E velocity: 59.60 cm/s MV A velocity: 76.60 cm/s  SHUNTS MV E/A ratio:  0.78        Systemic VTI:  0.19 m                             Systemic Diam: 1.80 cm Lennie Odor MD Electronically signed by Lennie Odor MD Signature Date/Time: 02/13/2024/8:53:15 PM    Final    VAS [place] LOWER EXTREMITY VENOUS (DVT) Result Date: 02/12/2024  Lower Venous DVT Study Patient Name:  ISAMAR NAZIR  Date of Exam:   02/12/2024 Medical Rec #: [id number]           Accession #:    [id number] Date of Birth: 06-13-1935           Patient Gender: F Patient Age:   75 years Exam Location:  [facility] Procedure:      VAS [place] LOWER EXTREMITY VENOUS (DVT) Referring Phys: Stephania Fragmin --------------------------------------------------------------------------------  Indications: Pulmonary embolism.  Risk Factors: Newly diagnosed peritoneal carcinomatosis. Comparison Study: No previous exams Performing Technologist: Jody Hill RVT, RDMS  Examination Guidelines: A complete evaluation includes B-mode imaging, spectral Doppler, color Doppler, and power Doppler as needed of all accessible portions of each vessel. Bilateral testing is considered an integral part of a complete examination. Limited examinations for reoccurring indications may be performed as noted. The reflux portion of the exam is performed with the patient in reverse Trendelenburg.  +---------+---------------+---------+-----------+----------+--------------+ RIGHT    CompressibilityPhasicitySpontaneityPropertiesThrombus Aging +---------+---------------+---------+-----------+----------+--------------+ CFV      Full           Yes      No                                  +---------+---------------+---------+-----------+----------+--------------+ SFJ      Full                                                        +---------+---------------+---------+-----------+----------+--------------+  FV Prox  Full           Yes      Yes                                 +---------+---------------+---------+-----------+----------+--------------+ FV Mid   Full           Yes       Yes                                 +---------+---------------+---------+-----------+----------+--------------+ FV DistalFull           Yes      Yes                                 +---------+---------------+---------+-----------+----------+--------------+ PFV      Full                                                        +---------+---------------+---------+-----------+----------+--------------+ POP      Full           Yes      Yes                                 +---------+---------------+---------+-----------+----------+--------------+ PTV      Full                                                        +---------+---------------+---------+-----------+----------+--------------+ PERO     Full                                                        +---------+---------------+---------+-----------+----------+--------------+   +---------+---------------+---------+-----------+----------+--------------+ LEFT     CompressibilityPhasicitySpontaneityPropertiesThrombus Aging +---------+---------------+---------+-----------+----------+--------------+ CFV      Full           Yes      Yes                                 +---------+---------------+---------+-----------+----------+--------------+ SFJ      Full                                                        +---------+---------------+---------+-----------+----------+--------------+ FV Prox  Full           Yes      Yes                                 +---------+---------------+---------+-----------+----------+--------------+ FV Mid   Full  Yes      Yes                                 +---------+---------------+---------+-----------+----------+--------------+ FV DistalFull           Yes      Yes                                 +---------+---------------+---------+-----------+----------+--------------+ PFV      Full                                                         +---------+---------------+---------+-----------+----------+--------------+ POP      Full           Yes      Yes                                 +---------+---------------+---------+-----------+----------+--------------+ PTV      None           No       No                   Acute          +---------+---------------+---------+-----------+----------+--------------+ PERO     None           No       No                   Acute          +---------+---------------+---------+-----------+----------+--------------+     Summary: BILATERAL: -No evidence of popliteal cyst, bilaterally. RIGHT: - There is no evidence of deep vein thrombosis in the lower extremity.  LEFT: - Findings consistent with acute deep vein thrombosis involving the left peroneal veins, and left posterior tibial veins.   *See table(s) above for measurements and observations. Electronically signed by Carolynn Sayers on 02/12/2024 at 8:18:12 PM.    Final    CT CHEST W CONTRAST Addendum Date: 02/11/2024 ADDENDUM REPORT: 02/11/2024 15:54 ADDENDUM: Critical Value/emergent results were called by telephone at the time of interpretation on 02/11/2024 at 3:54 pm to provider Eugene Garnet, MD, who verbally acknowledged these results. Electronically Signed   By: Delbert Phenix M.D.   On: 02/11/2024 15:54   Result Date: 02/11/2024 CLINICAL DATA:  Peritoneal carcinomatosis. Chest staging. * Tracking Code: BO * EXAM: CT CHEST WITH CONTRAST TECHNIQUE: Multidetector CT imaging of the chest was performed during intravenous contrast administration. RADIATION DOSE REDUCTION: This exam was performed according to the departmental dose-optimization program which includes automated exposure control, adjustment of the mA and/or kV according to patient size and/or use of iterative reconstruction technique. CONTRAST:  75mL OMNIPAQUE IOHEXOL 300 MG/ML  SOLN COMPARISON:  02/04/2024 CT abdomen/pelvis. 02/08/2024 chest radiograph. FINDINGS: Cardiovascular: Normal  heart size. No significant pericardial effusion/thickening. Left anterior descending coronary atherosclerosis. Right PICC terminates in the middle third of the SVC. Atherosclerotic nonaneurysmal thoracic aorta. Incidental acute pulmonary embolism involving the distal right pulmonary artery and lobar and segmental right upper lobe and right middle lobe pulmonary artery branches. No saddle embolus. Normal caliber main pulmonary artery. RV/LV ratio 1.0. Mediastinum/Nodes: No significant thyroid nodules.  Fluid level in the mid to lower thoracic esophagus with enteric tube terminating in the proximal stomach. No right axillary adenopathy. Mild left axillary adenopathy up to 1.1 cm (series 2/image 61). No pathologically enlarged mediastinal or hilar nodes. Lungs/Pleura: No pneumothorax. Small layering right pleural effusion. No left pleural effusion. Calcified 1 cm posterior right lower lobe granuloma. Mild dependent bilateral lower lobe atelectasis. No acute consolidative airspace disease, lung masses or significant pulmonary nodules. Upper abdomen: Moderate upper abdominal ascites with nodular soft tissue caking in the visualized anterior left upper peritoneum, as seen on recent CT abdomen study. Several hypodense liver lesions scattered throughout the visualized liver, unchanged, largest 1.6 cm in the inferior liver on series 2/image 155. Musculoskeletal: No aggressive appearing focal osseous lesions. Mild thoracic spondylosis. IMPRESSION: 1. Incidental acute pulmonary embolism involving the distal right pulmonary artery and lobar and segmental right upper lobe and right middle lobe pulmonary artery branches. No saddle embolus. Normal caliber main pulmonary artery. Borderline elevated RV/LV Ratio = 1.0. 2. Small layering right pleural effusion. Mild dependent bilateral lower lobe atelectasis. 3. Mild left axillary adenopathy, cannot exclude metastatic disease. No other evidence of metastatic disease in the chest. 4.  One vessel coronary atherosclerosis. 5. Moderate upper abdominal ascites with nodular soft tissue caking in the visualized anterior left upper peritoneum, as seen on recent CT abdomen study, compatible with peritoneal carcinomatosis. Several hypodense liver lesions scattered throughout the visualized liver, unchanged, suspicious for liver metastases. Electronically Signed: By: Delbert Phenix M.D. On: 02/11/2024 15:28   MR PELVIS W WO CONTRAST Result Date: 02/11/2024 CLINICAL DATA:  "Occult malignancy". Peritoneal carcinomatosis on abdominopelvic CT with bowel obstruction. EXAM: MRI PELVIS WITHOUT AND WITH CONTRAST TECHNIQUE: Multiplanar multisequence MR imaging of the pelvis was performed both before and after administration of intravenous contrast. CONTRAST:  5mL GADAVIST GADOBUTROL 1 MMOL/ML IV SOLN COMPARISON:  CT of 02/04/2024 FINDINGS: Urinary Tract: Primarily decompressed urinary bladder. Left renal sinus cysts without hydronephrosis. Bowel: Fluid-filled small bowel loops within the upper and mid pelvis measure up to 2.0 cm on 19/17. Improved from 3.4 cm on the prior exam. Again identified is an intussusception of the sigmoid into the rectum. There is mild hyperenhancement within the lead point including on 41/17, without restricted diffusion in this area. Vascular/Lymphatic: Ileocolic mesenteric adenopathy including at 2.2 x 1.8 cm on 08/17. No pelvic sidewall adenopathy. Reproductive: The endometrium is thickened for age including at 1.0 cm on 18/4. This is followed to the level of the cervix and lower uterine segment, where soft tissue fullness is again identified including on 18/4 and 21/3. Correlate restricted diffusion on 22/11. Example 3.7 x 3.7 cm on 31/17. There is amorphous soft tissue fullness within both adnexa, greater right than left. Example restricted diffusion on 17/11. The right adnexal soft tissue fullness measures 3.3 x 3.3 cm on 18/17. No separate ovarian tissue identified. Other:  Peritoneal carcinomatosis, as evidenced by diffuse peritoneal enhancement and restricted diffusion (example 22/11). Small volume pelvic fluid. Musculoskeletal: No acute osseous abnormality. IMPRESSION: 1. Abnormal amorphous soft tissue throughout lower uterine segment and cervix could represent primary uterine/cervical carcinoma or metastatic disease. 2. Diffuse peritoneal carcinomatosis as better imaged on dedicated CT. Ileocolic mesenteric nodal metastasis. 3. Right greater than left adnexal soft tissue fullness likely represents ovarian metastasis. 4. Chronic or recurrent sigmoid/rectal intussusception since 02/04/2024. Vague hyperenhancement in the region of the lead point, without dominant mass. Recommend physical exam correlation to exclude either primary rectosigmoid carcinoma or serosal sigmoid implant lead point. 5. Small  volume pelvic fluid. 6. Improved small bowel dilatation since prior CT. Electronically Signed   By: Jeronimo Greaves M.D.   On: 02/11/2024 15:42   DG Abd 1 View Result Date: 02/10/2024 CLINICAL DATA:  16109 Abdominal distention [id number] [phone] SBO (small bowel obstruction) (HCC) [id number] EXAM: ABDOMEN - 1 VIEW COMPARISON:  02/08/2024 FINDINGS: NG tube terminates within the proximal stomach. Worsening small bowel dilation in the mid abdomen measuring up to 5.3 cm (previously 3.3 cm. Paucity of bowel gas within the colon. No gross free intraperitoneal air. IMPRESSION: 1. Worsening small bowel obstruction. 2. NG tube terminates within the proximal stomach. Recommend advancement. Electronically Signed   By: Duanne Guess D.O.   On: 02/10/2024 09:35   DG Chest 1 View Result Date: 02/08/2024 CLINICAL DATA:  PICC line placement EXAM: CHEST  1 VIEW COMPARISON:  02/05/2024 FINDINGS: Right PICC line in place with the tip in the SVC. NG tube is in the stomach. Heart and mediastinal contours within normal limits. No confluent opacities or effusions. No acute bony abnormality. IMPRESSION: Right PICC  line tip in the SVC. No active cardiopulmonary disease. Electronically Signed   By: Charlett Nose M.D.   On: 02/08/2024 17:51   [place] EKG SITE RITE Result Date: 02/08/2024 If Site Rite image not attached, placement could not be confirmed due to current cardiac rhythm.  DG Abd Portable 1V Result Date: 02/08/2024 CLINICAL DATA:  Check gastric catheter placement EXAM: PORTABLE ABDOMEN - 1 VIEW COMPARISON:  02/04/2024 FINDINGS: Scattered large and small bowel gas is noted. Mild small bowel dilatation is noted significantly improved when compared with the prior exam. Gastric catheter is noted with the tip in the stomach. No free air is noted. IMPRESSION: Persistent but significantly improved small bowel dilatation when compared with the prior exam. Electronically Signed   By: Alcide Clever M.D.   On: 02/08/2024 09:34   US Paracentesis Result Date: 02/05/2024 INDICATION: 89 year old with new abdominal distention, ascites. Request made for diagnostic and therapeutic paracentesis. EXAM: ULTRASOUND GUIDED DIAGNOSTIC  PARACENTESIS MEDICATIONS: 10 mL 1% lidocaine. COMPLICATIONS: None immediate. PROCEDURE: Informed written consent was obtained from the patient after a discussion of the risks, benefits and alternatives to treatment. A timeout was performed prior to the initiation of the procedure. Initial ultrasound scanning demonstrates a small amount of ascites within the left lateral abdomen interlooped with bowel. The left lateral abdomen was prepped and draped in the usual sterile fashion. 1% lidocaine was used for local anesthesia. Following this, a 19 gauge, 7-cm, Yueh catheter was introduced. An ultrasound image was saved for documentation purposes. The paracentesis was performed. The catheter was removed and a dressing was applied. The patient tolerated the procedure well without immediate post procedural complication. FINDINGS: A total of approximately 100 mL of yellow fluid was removed. Samples were sent to the  laboratory as requested by the clinical team. IMPRESSION: Successful ultrasound-guided paracentesis yielding 100 mL of peritoneal fluid. Performed by: Loyce Dys PA-C Electronically Signed   By: Malachy Moan M.D.   On: 02/05/2024 14:31   US PELVIC COMPLETE WITH TRANSVAGINAL Result Date: 02/05/2024 CLINICAL DATA:  89 year old female with ascites, peritoneal carcinomatosis on CT Abdomen and Pelvis yesterday. EXAM: TRANSABDOMINAL AND TRANSVAGINAL ULTRASOUND OF PELVIS TECHNIQUE: Both transabdominal and transvaginal ultrasound examinations of the pelvis were performed. Transabdominal technique was performed for global imaging of the pelvis including uterus, ovaries, adnexal regions, and pelvic cul-de-sac. It was necessary to proceed with endovaginal exam following the transabdominal exam to visualize the ovaries.  COMPARISON:  CT Abdomen and Pelvis 02/04/2024. FINDINGS: Uterus Measurements: 6.9 x 3.9 x 3.9 cm = volume: 41 mL. No obvious myometrial mass. Endometrium Heterogeneous hypoechoic area at the fundus endometrium (image 49 of series 1). This area encompasses about 2 cm (image 55). See also cine series 3 images. Otherwise the echogenic endometrial stripe is up to 5 mm. Right ovary Measurements: 3.5 x 2.1 x 2.8 cm = volume: 11 mL. No ovarian mass identified. Left ovary Measurements: 2.3 x 1.5 x 1.7 cm = volume: 3 mL. No ovarian mass identified. Other findings Small volume ascites again visible in the pelvis. IMPRESSION: 1. No ovarian mass identified by ultrasound. 2. There is a 2 cm heterogeneous area at the uterine fundus which is favored to be endometrial related. Elsewhere the endometrial stripe is about 5 mm. An Endometrial Malignancy is not excluded. 3. Ascites, in conjunction with the extensive additional abdominal and pelvic abnormalities demonstrated by CT yesterday. Electronically Signed   By: Odessa Fleming M.D.   On: 02/05/2024 12:17   DG Chest Portable 1 View Result Date: 02/05/2024 CLINICAL DATA:   NG tube replacement confirmation EXAM: PORTABLE CHEST - 1 VIEW COMPARISON:  02/04/2024 FINDINGS: Cardiomediastinal silhouette and pulmonary vasculature are within normal limits. Lungs are clear. Nasogastric tube extends to the left upper quadrant the side hole located at the level of the gastroesophageal junction. It is slightly retracted compared to prior exam. IMPRESSION: Nasogastric tube terminates in the left upper quadrant in the expected site of the stomach. The side hole of the NG tube is located at the level of the gastroesophageal junction. Electronically Signed   By: Acquanetta Belling M.D.   On: 02/05/2024 07:38   DG Chest Portable 1 View Result Date: 02/04/2024 CLINICAL DATA:  NG tube placement. Abdominal discomfort, heartburn, and nausea. EXAM: PORTABLE CHEST 1 VIEW COMPARISON:  None Available. FINDINGS: The heart size and mediastinal contours are within normal limits. There is atherosclerotic calcification of the aorta. No consolidation, effusion, or pneumothorax. An enteric tube terminates in the stomach. No acute osseous abnormality. IMPRESSION: 1. No active disease. 2. Enteric tube terminates in the stomach. Electronically Signed   By: Thornell Sartorius M.D.   On: 02/04/2024 21:20   CT ABDOMEN PELVIS W CONTRAST Result Date: 02/04/2024 CLINICAL DATA:  Abdominal pain, bloating, nausea and vomiting for 2 weeks. * Tracking Code: BO * EXAM: CT ABDOMEN AND PELVIS WITH CONTRAST TECHNIQUE: Multidetector CT imaging of the abdomen and pelvis was performed using the standard protocol following bolus administration of intravenous contrast. RADIATION DOSE REDUCTION: This exam was performed according to the departmental dose-optimization program which includes automated exposure control, adjustment of the mA and/or kV according to patient size and/or use of iterative reconstruction technique. CONTRAST:  OMNIPAQUE IOHEXOL 300 MG/ML  SOLN COMPARISON:  None Available. FINDINGS: Lower Chest: No acute findings.  Hepatobiliary: A few tiny hepatic cysts are seen, however, there are multiple hypovascular masses involving the right and left hepatic lobes, largest in the central right hepatic lobe measuring 1.9 x 1.5 cm on image 26/2. These are consistent with liver metastases. Gallbladder is unremarkable. No evidence of biliary ductal dilatation. Pancreas:  No mass or inflammatory changes. Spleen: Within normal limits in size and appearance. Adrenals/Urinary Tract: No suspicious masses identified. No evidence of ureteral calculi or hydronephrosis. Stomach/Bowel: Distal small bowel obstruction, with transition point in the right lower quadrant, likely due to peritoneal carcinoma. Rectal intussusception incidentally noted. Vascular/Lymphatic: Mild retroperitoneal lymphadenopathy in the left para-aortic and aortocaval spaces.  No acute vascular findings. Reproductive: A 1.6 cm peripherally calcified fibroid is seen in the uterine fundus. Poorly defined masslike soft tissue prominence is seen in the region of the cervix and lower uterine segment measuring 3.6 x 3.3 cm on image 63/2, possibly representing a primary cervical or endometrial carcinoma. Poorly defined soft tissue density is seen in both adnexal regions and along the uterine fundus, consistent with peritoneal carcinomatosis. Other: Mild ascites is seen. Peritoneal and omental soft tissue nodules and masses are seen throughout the abdomen and pelvis, consistent with peritoneal carcinomatosis. Largest conglomerate mass is seen in the right abdomen measuring approximately 6.7 x 3.4 cm on image 37/2. Musculoskeletal:  No suspicious bone lesions identified. IMPRESSION: Diffuse peritoneal carcinomatosis and mild ascites. Distal small bowel obstruction, with transition point in the right lower quadrant, likely due to peritoneal carcinomatosis. Masslike soft tissue prominence in the region of the lower uterine segment and cervix, possibly representing primary cervical or  endometrial carcinoma. Liver metastases. Mild retroperitoneal lymphadenopathy, consistent with metastatic disease. Rectal intussusception incidentally noted. No definite lead mass identified. Electronically Signed   By: Danae Orleans M.D.   On: 02/04/2024 11:34   Ms. Deangelo reviewed and examined.  She appears stable.  She is tolerating a full liquid diet.  She continues to pass flatus and small bowel movements.  No new complaint.  The abdomen remains mildly distended.  She is completing a Decadron taper.  She has mild oral candidiasis.  We will prescribe a dose of Diflucan and she will begin nystatin rinse.  She plans to be discharged to home today or tomorrow.  The NGS testing on the ascitic fluid should be resulted on 03/02/2024.  Recommendations: Decadron taper per palliative care medicine Diflucan/nystatin suspension for oral candidiasis Continue apixaban anticoagulation Outpatient follow-up at the Cancer center 03/06/2024

## 2024-02-29 NOTE — Plan of Care (Signed)

## 2024-02-29 NOTE — Plan of Care (Signed)
  Problem: Education: Goal: Knowledge of General Education information will improve Description: Including pain rating scale, medication(s)/side effects and non-pharmacologic comfort measures Outcome: Progressing   Problem: Health Behavior/Discharge Planning: Goal: Ability to manage health-related needs will improve Outcome: Progressing   Problem: Clinical Measurements: Goal: Ability to maintain clinical measurements within normal limits will improve Outcome: Progressing Goal: Will remain free from infection Outcome: Progressing Goal: Diagnostic test results will improve Outcome: Progressing   Problem: Activity: Goal: Risk for activity intolerance will decrease Outcome: Progressing   Problem: Nutrition: Goal: Adequate nutrition will be maintained Outcome: Progressing   Problem: Pain Managment: Goal: General experience of comfort will improve and/or be controlled Outcome: Progressing   Problem: Safety: Goal: Ability to remain free from injury will improve Outcome: Progressing   Problem: Skin Integrity: Goal: Risk for impaired skin integrity will decrease Outcome: Progressing

## 2024-03-01 ENCOUNTER — Other Ambulatory Visit (HOSPITAL_COMMUNITY): Payer: Self-pay

## 2024-03-01 ENCOUNTER — Telehealth: Payer: Self-pay

## 2024-03-01 ENCOUNTER — Other Ambulatory Visit: Payer: Self-pay | Admitting: *Deleted

## 2024-03-01 ENCOUNTER — Encounter (HOSPITAL_COMMUNITY): Payer: Self-pay | Admitting: Oncology

## 2024-03-01 DIAGNOSIS — C541 Malignant neoplasm of endometrium: Secondary | ICD-10-CM | POA: Diagnosis not present

## 2024-03-01 DIAGNOSIS — I10 Essential (primary) hypertension: Secondary | ICD-10-CM | POA: Diagnosis not present

## 2024-03-01 DIAGNOSIS — C786 Secondary malignant neoplasm of retroperitoneum and peritoneum: Secondary | ICD-10-CM

## 2024-03-01 DIAGNOSIS — I2699 Other pulmonary embolism without acute cor pulmonale: Secondary | ICD-10-CM | POA: Diagnosis not present

## 2024-03-01 DIAGNOSIS — I82402 Acute embolism and thrombosis of unspecified deep veins of left lower extremity: Secondary | ICD-10-CM | POA: Diagnosis not present

## 2024-03-01 DIAGNOSIS — D630 Anemia in neoplastic disease: Secondary | ICD-10-CM | POA: Diagnosis not present

## 2024-03-01 DIAGNOSIS — N179 Acute kidney failure, unspecified: Secondary | ICD-10-CM | POA: Diagnosis not present

## 2024-03-01 DIAGNOSIS — R180 Malignant ascites: Secondary | ICD-10-CM | POA: Diagnosis not present

## 2024-03-01 DIAGNOSIS — K56609 Unspecified intestinal obstruction, unspecified as to partial versus complete obstruction: Secondary | ICD-10-CM | POA: Diagnosis not present

## 2024-03-01 DIAGNOSIS — C7960 Secondary malignant neoplasm of unspecified ovary: Secondary | ICD-10-CM | POA: Diagnosis not present

## 2024-03-01 LAB — BASIC METABOLIC PANEL
Anion gap: 6 (ref 5–15)
BUN: 40 mg/dL — ABNORMAL HIGH (ref 8–23)
CO2: 24 mmol/L (ref 22–32)
Calcium: 8.1 mg/dL — ABNORMAL LOW (ref 8.9–10.3)
Chloride: 105 mmol/L (ref 98–111)
Creatinine, Ser: 0.77 mg/dL (ref 0.44–1.00)
GFR, Estimated: >60 mL/min (ref >60)
Glucose, Bld: 102 mg/dL — ABNORMAL HIGH (ref 70–99)
Potassium: 4.2 mmol/L (ref 3.5–5.1)
Sodium: 135 mmol/L (ref 135–145)

## 2024-03-01 LAB — PHOSPHORUS: Phosphorus: 3.1 mg/dL (ref 2.5–4.6)

## 2024-03-01 LAB — MAGNESIUM: Magnesium: 2.1 mg/dL (ref 1.7–2.4)

## 2024-03-01 LAB — GLUCOSE, CAPILLARY
Glucose-Capillary: 124 mg/dL — ABNORMAL HIGH (ref 70–99)
Glucose-Capillary: 84 mg/dL (ref 70–99)
Glucose-Capillary: 98 mg/dL (ref 70–99)

## 2024-03-01 MED ORDER — OCTREOTIDE ACETATE 100 MCG/ML IJ SOLN
300.0000 ug | Freq: Every day | INTRAMUSCULAR | 0 refills | Status: DC
  Filled 2024-03-01: qty 90, 30d supply, fill #0

## 2024-03-01 NOTE — Telephone Encounter (Signed)
 [facility] Pam Chamber Clinical Rep/RN stated that this patient is supposed to be discharged today, going home on TPN. Requesting more information in order for Medicare approval, Dr. Joya Salm Hospitalist working on the needed documents. Will call back if any additional information is needed. Made Dr. Truett Perna aware.

## 2024-03-01 NOTE — TOC Transition Note (Signed)
 Transition of Care [facility]) - Discharge Note   Patient Details  Name: Tracy Preston MRN: [id number] Date of Birth: 14-Jan-1935  [facility]) CM/SW Contact:  Beckie Busing, RN Phone Number:[phone]  03/01/2024, 1:06 PM   Clinical Narrative:    Patient with discharge orders. Patient is discharging home with family. Home TPN will be managed by [facility]. CM has confirmed with pam at [facility] that patient is ok for discharge. Everything is in place for home TPN. Frances Furbish will follow for Home health RN. No other TOC needs noted at this time. TOC will sign off.    Final next level of care: Home w Home Health Services Barriers to Discharge: No Barriers Identified   Patient Goals and CMS Choice Patient states their goals for this hospitalization and ongoing recovery are:: Wants to get better to go home CMS Medicare.gov Compare Post Acute Care list provided to::  (n/a) Choice offered to / list presented to : NA      Discharge Placement                       Discharge Plan and Services Additional resources added to the After Visit Summary for   In-house Referral: NA Discharge Planning Services: CM Consult Post Acute Care Choice: NA          DME Arranged: N/A DME Agency: NA       HH Arranged: RN HH Agency: [facility] Date [facility] Agency Contacted: 03/01/24 Time HH Agency Contacted: 1306 Representative spoke with at [facility] Agency: Kandee Keen  Social Drivers of Health (SDOH) Interventions SDOH Screenings   Food Insecurity: No Food Insecurity (02/05/2024)  Housing: Low Risk  (02/05/2024)  Transportation Needs: No Transportation Needs (02/05/2024)  Utilities: Not At Risk (02/05/2024)  Social Connections: Socially Integrated (02/05/2024)  Tobacco Use: Unknown (02/04/2024)     Readmission Risk Interventions    02/23/2024   12:50 PM  Readmission Risk Prevention Plan  Transportation Screening Complete  PCP or Specialist Appt within 3-5 Days Complete  HRI or Home Care  Consult Complete  Social Work Consult for Recovery Care Planning/Counseling Complete  Palliative Care Screening Not Applicable  Medication Review Oceanographer) Complete

## 2024-03-01 NOTE — Progress Notes (Signed)
 PHARMACY - TOTAL PARENTERAL NUTRITION CONSULT NOTE   Indication: SBO  Assessment: 21 yoF admitted 2/7 after 2 weeks of progressively worsening reflux and abdominal distention with n/v. 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 Plan for bowel rest +/- venting G-tube while patient undergoes chemotherapy to hopefully relieve obstruction. Pharmacy consulted for TPN management.  3/2 - request by providers to make TPN cyclic in preparation for discharge  Glucose / Insulin: no hx DM; CBG goal 100-180 -CBGs 89-123 in last 24hrs -1 unit of insulin given in last 24 hours  Electrolytes:  - all WNL Renal:  - SCr stable WNL; BUN elevated but stable Hepatic: 3/3 - LFTs WNL, Alk Phos elevated but improving - Tbili remains stable WNL - TG WNL 2/24 I/O: Continues to have improving bowel function as noted 2/27 per GynOnc (flatus, loose stools) - NG removed 2/22 Tolerated liquids yesterday. - Stool: none charted - UOP: none charted - no mIVF GI Imaging: - 2/7 CT a/p: distal SBO d/t diffuse peritoneal carcinomatosis, with transition point in RLQ, hepatic and retroperitoneal metastases; incidental finding of rectal intussusception - 2/11 AXR: persistent but significantly improved SB dilation when compared with prior - 2/13 AXR: Worsening small bowel obstruction.  - 2/14 MRI pelvis: Abnormal amorphous soft tissue throughout lower uterine segment and cervix with ileocolic and likely ovarian mets. - 2/25 CT abdomen pelvis: Persistent distal colonic intussusception with the sigmoid colon invaginated into the rectum. Appearance is similar to the previous CT from 02/04/2024 and may relate to underlying peritoneal tumor. Mild improvement in distal partial small bowel obstruction with enteric contrast passing into the mid colon. GI Surgeries / Procedures: NA  Central access: PICC TPN start date: 2/11  Nutritional Goals: Goal cyclic TPN rate is 73-145 mL/hr over  12 hours (provides 94 g of protein and 1622 kcals per  day) - slight adjustment done to being glucose infusion rate down   RD Assessment: Estimated Needs Total Energy Estimated Needs: 1625-1900 kcal Total Protein Estimated Needs: 80-95g Total Fluid Estimated Needs: 1.9L/day  Current Nutrition:  TPN, Diet advanced to clear liquid on 2/22 (patient reportedly unable to take more than small sips).  Patient to discuss possible PEG vent with family - does not appear to need the gastrostomy tube at present 3/1 Tolerated liquids 3/2 Diet advanced to full liquid  Plan:  For consistency of cyclic rates, the 1 hour taper up and down will be half the rate of the cyclic rate. Electrolytes in TPN:  Na - 120 mEq/L K - in to 20 mEq/L Ca - 3 mEq/L Mg - 8 mEq/L Phos -  20 mmol/L Cl:Ac ratio - 1:1 Max glucose inf rate is 7.38 mg/kg/min Add standard MVI and trace elements to TPN Continue to hold chromium for national backorder mIVF per MD. Adjust  sSSI to cyclic TPN monitoring  CBGs should be checked 2 hrs past cyclic TPN start + 1 hr past cyclic TPN       d/c + middle of TPN infusion while   off  TPN (total 4 CBGs/24 hr period) BMP, magnesium, phosphorus with AM labs  Monitor TPN labs on Mon/Thurs. Per note, plan is for discharge today, would recommend no changes to TPN formula as above for electrolytes as well as protein 59 g/L, dextrose 17% and fat emul (SMOFlipid) 20% at 20 g/L with rates of 73 ml/hr x 1 hr then increase to 145 ml/hr x 10 hr then decrease rate to 73 ml/hr x 1 hr   Hessie Knows, PharmD,  BCPS Secure Chat if ?s 03/01/2024 10:46 AM

## 2024-03-01 NOTE — Plan of Care (Signed)
  Problem: Pain Managment: Goal: General experience of comfort will improve and/or be controlled Outcome: Progressing   Problem: Safety: Goal: Ability to remain free from injury will improve Outcome: Progressing   Problem: Skin Integrity: Goal: Risk for impaired skin integrity will decrease Outcome: Progressing

## 2024-03-01 NOTE — Progress Notes (Addendum)
 Discharge instructions reviewed with patient and daughter. All questions answered. All belongings accounted for. Patient to follow up with MD in  1-2 weeks. Patient medications hand delivered from outpatient pharmacy. Assisted via WC to private vehicle  Instruction on eloquis starter pack given. Patient to start 5mg  dose on 3/8 at night. Hand wrote in AVS. Verbalized understanding

## 2024-03-01 NOTE — Progress Notes (Signed)
 Mobility Specialist - Progress Note   03/01/24 1112  Mobility  Activity Ambulated with assistance in hallway  Level of Assistance Modified independent, requires aide device or extra time  Assistive Device Front wheel walker  Distance Ambulated (ft) 500 ft  Activity Response Tolerated well  Mobility Referral Yes  Mobility visit 1 Mobility  Mobility Specialist Start Time (ACUTE ONLY) 1059  Mobility Specialist Stop Time (ACUTE ONLY) 1112  Mobility Specialist Time Calculation (min) (ACUTE ONLY) 13 min   Pt received in bed and agreeable to mobility. No complaints during session. Pt to EOB after session with all needs met.   [facility]

## 2024-03-01 NOTE — TOC Progression Note (Signed)
 Transition of Care [facility]) - Progression Note    Patient Details  Name: Tracy Preston MRN: [id number] Date of Birth: [birth year]  [facility] CM/SW Contact  Beckie Busing, RN Phone [phone]  03/01/2024, 9:46 AM  Clinical Narrative:    Cm has received message from [facility] with [facility] requesting documentation to get  TPN set up for today. LON (length of need) needs to be for anticipated long term need. The dietician will need this documentation before we can confirm she qualifies in order to mix the TPN. Also need provider to to add order along with home health orders stating TPN per [facility] protocol for labs and dosing. This information is needed asap to prevent delay in discharge. Message has been sent to MD.    Expected Discharge Plan: Home/Self Care Barriers to Discharge: No Barriers Identified, Continued Medical Work up  Expected Discharge Plan and Services In-house Referral: NA Discharge Planning Services: CM Consult Post Acute Care Choice: NA Living arrangements for the past 2 months: Single Family Home                 DME Arranged: N/A DME Agency: NA         HH Agency: NA         Social Determinants of Health (SDOH) Interventions SDOH Screenings   Food Insecurity: No Food Insecurity (02/05/2024)  Housing: Low Risk  (02/05/2024)  Transportation Needs: No Transportation Needs (02/05/2024)  Utilities: Not At Risk (02/05/2024)  Social Connections: Socially Integrated (02/05/2024)  Tobacco Use: Unknown (02/04/2024)    Readmission Risk Interventions    02/23/2024   12:50 PM  Readmission Risk Prevention Plan  Transportation Screening Complete  PCP or Specialist Appt within 3-5 Days Complete  HRI or Home Care Consult Complete  Social Work Consult for Recovery Care Planning/Counseling Complete  Palliative Care Screening Not Applicable  Medication Review Oceanographer) Complete

## 2024-03-01 NOTE — Plan of Care (Signed)
  Problem: Education: Goal: Knowledge of General Education information will improve Description: Including pain rating scale, medication(s)/side effects and non-pharmacologic comfort measures Outcome: Progressing   Problem: Health Behavior/Discharge Planning: Goal: Ability to manage health-related needs will improve Outcome: Progressing   Problem: Clinical Measurements: Goal: Ability to maintain clinical measurements within normal limits will improve Outcome: Progressing Goal: Will remain free from infection Outcome: Progressing Goal: Diagnostic test results will improve Outcome: Progressing   Problem: Activity: Goal: Risk for activity intolerance will decrease Outcome: Progressing   Problem: Nutrition: Goal: Adequate nutrition will be maintained Outcome: Progressing   Problem: Pain Managment: Goal: General experience of comfort will improve and/or be controlled Outcome: Progressing   Problem: Safety: Goal: Ability to remain free from injury will improve Outcome: Progressing   Problem: Skin Integrity: Goal: Risk for impaired skin integrity will decrease Outcome: Progressing

## 2024-03-01 NOTE — Discharge Summary (Addendum)
 Physician Discharge Summary   Patient: Tracy Preston MRN: [id number] DOB: [birth year]  Admit date:     02/04/2024  Discharge date: 03/01/24  Discharge Physician: Kathlen Mody   PCP: Marden Noble, MD (Inactive)   Recommendations at discharge:  Please follow up with oncology as recommended.   Discharge Diagnoses: Principal Problem:   Carcinomatosis peritonei causing obstruction Active Problems:   Acute pulmonary embolism (HCC)   Essential hypertension   GERD (gastroesophageal reflux disease)   Malnutrition of moderate degree   Hyponatremia   Anemia   Small bowel obstruction (HCC)   SBO (small bowel obstruction) (HCC)  Resolved Problems:   * No resolved hospital problems. *  Hospital Course:   89 year old female with history of hypertension, breast cancer who presented with abdominal distention, vomiting for 2 weeks. CT abdomen was concerning for new metastatic disease from possible GYN origin, SBO. Further workup showed peritoneal carcinomatosis with likely GYN malignancy. GYN oncology, general surgery consulted pelvic MRI showed advanced GYN malignancy. CT chest with contrast showed distal pulmonary PE present with cor pulmonale, started on heparin drip. Received first cycle of chemo on 2/15. Hospital course remarkable for persistent bowel obstruction, started on TPN, Decadron. Palliative care following. Now having bowel movements. Small bowel SBO protocol shows nondilated colon with contrast on entire colon. NG tube removed on 2/22, started on clears, currently tolerating. Clears, advanced to full liquid diet.   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  Plans to DC on octreotide 300 mg daily to be added to TPN and TPN FOR HOME. Patient needs TPN for nutritional requirements.  TPN per [facility] protocol for labs and dosing. TPN for Long term need. Patient is not able to meet nutritional requirements with enteral feedings.  She will need TPN to meet the nutritional requirements.  including advancing diet as tolerated No indications for venting PEG at this time   Right-sided distal acute PE Currently on room air Lower EXTR Doppler positive for DVT Echo showed preserved EF S/p IV heparin--> p.o. Eliquis   Poor nutritional status/severe protein calorie malnutrition On TPN   Hypertension Blood pressure soft Hold losartan, until she sees PCP.   GERD Continue PPI   Normocytic anemia/ anemia of chronic disease Currently hemoglobin stable       Consultants: oncology Procedures performed: None.   Disposition: Home Diet recommendation:  TPN. DISCHARGE MEDICATION: Allergies as of 03/01/2024       Reactions   Calcium-containing Compounds Other (See Comments)   Upsets the stomach, so it is not taken very often        Medication List     PAUSE taking these medications    losartan 100 MG tablet Wait to take this until your doctor or other care provider tells you to start again. Commonly known as: COZAAR Take 100 mg by mouth every morning.       TAKE these medications    Anti-Fungal 1 % cream Generic drug: clotrimazole Apply 1 Application topically daily as needed (for any yeast issues- affected areas).   calcium-vitamin D 500-200 MG-UNIT tablet Commonly known as: OSCAL WITH D Take 1 tablet by mouth every 7 (seven) days.   dexamethasone 2 MG tablet Commonly known as: DECADRON Take  2 tablets (4 mg total) by mouth daily for 3 days, THEN 1 tablet (2 mg total) daily for 4 days. Start taking on: March 01, 2024   Eliquis DVT/PE Starter Pack Generic drug: Apixaban Starter  Pack (10mg  and 5mg ) Take as directed on package: Start with two 5mg  tablets twice daily for 7 days then on day 8, switch to one 5mg  tablet twice daily.   nystatin 100000 UNIT/ML suspension Commonly known as: MYCOSTATIN Take 5 mLs (500,000 Units total) by mouth 4 (four) times daily.   octreotide 100 MCG/ML Soln injection Commonly known as: SANDOSTATIN Inject 3 mLs (300 mcg total) into the vein daily. To be added to TPN.   ondansetron 4 MG disintegrating tablet Commonly known as: ZOFRAN-ODT Take 1 tablet (4 mg total) by mouth every 8 (eight) hours as needed for nausea or vomiting.   pantoprazole 40 MG tablet Commonly known as: PROTONIX Take 1 tablet (40 mg total) by mouth daily.   Vitamin D3 1000 units Caps Take 1,000 Units by mouth daily with breakfast.        Discharge Exam: Filed Weights   02/21/24 0500 02/26/24 0706 02/28/24 0529  Weight: 57.2 kg 58 kg 55.7 kg   General exam: Appears calm and comfortable  Respiratory system: Clear to auscultation. Respiratory effort normal. Cardiovascular system: S1 & S2 heard, RRR. No JVD, Gastrointestinal system: Abdomen is nondistended, soft and nontender.  Central nervous system: Alert and oriented. No focal neurological deficits. Extremities: Symmetric 5 x 5 power. Skin: No rashes,  Psychiatry: Mood & affect appropriate.    Condition at discharge: fair  The results of significant diagnostics from this hospitalization (including imaging, microbiology, ancillary and laboratory) are listed below for reference.   Imaging Studies: CT ABDOMEN PELVIS W CONTRAST Result Date: 02/22/2024 CLINICAL DATA:  Ovarian cancer, bowel obstruction, restaging. * Tracking Code: BO * EXAM: CT ABDOMEN AND PELVIS WITH CONTRAST TECHNIQUE: Multidetector CT imaging of the abdomen and pelvis was performed using the standard protocol following bolus administration of intravenous contrast. RADIATION DOSE REDUCTION: This exam was performed according to the  departmental dose-optimization program which includes automated exposure control, adjustment of the mA and/or kV according to patient size and/or use of iterative reconstruction technique. CONTRAST:  OMNIPAQUE IOHEXOL 300 MG/ML  SOLN COMPARISON:  Abdominopelvic CT 02/04/2024, pelvic MRI 02/11/2024 and chest CT 02/11/2024. FINDINGS: Lower chest: Small right-greater-than-left pleural effusions with dependent atelectasis at both lung bases, new from 02/04/2024 and increased from 02/11/2024. Contrast filled and mildly distended distal esophagus. Hepatobiliary: Multiple hypoenhancing hepatic metastases are again noted. The largest lesion superior to the gallbladder is less well-defined on the current study and may be slightly smaller, measuring 1.4 cm on image 26/2 (previously 1.9 x 1.5 cm). Other lesions are grossly stable, including a lesion more superiorly in the central right lobe measuring 1.4 cm on image 18/2. Small hepatic cysts are also noted. No evidence of gallstones, gallbladder wall thickening or biliary dilatation. Pancreas: Unremarkable. No pancreatic ductal dilatation or surrounding inflammatory changes. Spleen: Stable 8 mm low-density splenic lesion on image 24/2, likely a cyst. The spleen is normal in size without other focal abnormality. Adrenals/Urinary Tract: Both adrenal glands appear normal. No evidence of urinary tract calculus, suspicious renal lesion or hydronephrosis. The right renal pelvis is mildly dilated without secondary signs of ureteral obstruction. There are renal sinus cysts bilaterally. The bladder appears unremarkable for its degree of distention. Stomach/Bowel: Enteric contrast has passed into the mid colon. The stomach remains mildly distended. Small bowel  distension has mildly improved. The colon remains decompressed. There is a persistent distal colonic intussusception with the sigmoid colon invaginated into the rectum. Appearance is similar to the previous CT from  02/04/2024. No evidence of bowel perforation. Vascular/Lymphatic: Small retroperitoneal and mesenteric lymph nodes appear unchanged. Mild aortic and branch vessel atherosclerosis without evidence of aneurysm or large vessel occlusion. The portal, superior mesenteric and splenic veins remain patent. Reproductive: Persistent ill-defined mass involving the cervix, measuring approximately 3.4 x 4.0 cm on image 62/2. There is mild distension of the endometrial cavity. A small peripherally calcified uterine fundal fibroid is noted. Persistent ill-defined adnexal masses bilaterally, measuring approximately 4.0 x 2.6 cm on the right (image 54/2) and 2.9 x 1.9 cm on the left (image 57/2). Other: Small to moderate volume of ascites, similar to previous CT. Diffuse omental nodularity may be minimally improved in the interval with a dominant component in the right mid abdomen measuring 3.8 x 2.3 cm on image 39/2. No extravasated enteric contrast or pneumoperitoneum. Musculoskeletal: No acute or significant osseous findings. Mild lumbar spondylosis. IMPRESSION: 1. Persistent distal colonic intussusception with the sigmoid colon invaginated into the rectum. Appearance is similar to the previous CT from 02/04/2024 and may relate to underlying peritoneal tumor. 2. Mild improvement in distal partial small bowel obstruction with enteric contrast passing into the mid colon. The colon remains decompressed. 3. Persistent ill-defined cervical and bilateral adnexal masses consistent with known ovarian cancer. Associated peritoneal carcinomatosis may be minimally improved. No definite progressive disease. 4. Persistent hepatic metastases, one of which may be slightly smaller in the interval. 5. Small right-greater-than-left pleural effusions with dependent atelectasis at both lung bases, new from 02/04/2024 and increased from 02/11/2024. 6.  Aortic Atherosclerosis (ICD10-I70.0). Electronically Signed   By: Carey Bullocks M.D.   On:  02/22/2024 16:08   DG Abd Portable 1V-Small Bowel Obstruction Protocol-initial, 8 hr delay Result Date: 02/18/2024 CLINICAL DATA:  8 hour delay small bowel obstruction EXAM: PORTABLE ABDOMEN - 1 VIEW COMPARISON:  Abdominal x-ray 02/18/2024 FINDINGS: Enteric tube tip is in the fundus of the stomach with sidehole at the level of the gastroesophageal junction. Contrast is seen throughout nondilated colon to the level of the rectum. No dilated bowel loops are seen. IMPRESSION: 1. Enteric tube tip is in the fundus of the stomach with sidehole at the level of the gastroesophageal junction. Recommend advancement 8 cm. 2. Contrast is seen throughout nondilated colon to the level of the rectum. No evidence for bowel obstruction. Electronically Signed   By: Darliss Cheney M.D.   On: 02/18/2024 20:57   DG Abd Portable 1V-Small Bowel Protocol-Position Verification Result Date: 02/18/2024 CLINICAL DATA:  [id number] Encounter for imaging study to confirm nasogastric (NG) tube placement [id number] EXAM: PORTABLE ABDOMEN - 1 VIEW COMPARISON:  02/10/2024 abdominal radiograph FINDINGS: Enteric tube terminates in the proximal stomach. No dilated small bowel loops. No evidence of pneumatosis or pneumoperitoneum. Mild elevation of the left hemidiaphragm with clear lung bases. IMPRESSION: Enteric tube terminates in the proximal stomach. Electronically Signed   By: Delbert Phenix M.D.   On: 02/18/2024 13:42   ECHOCARDIOGRAM COMPLETE Result Date: 02/13/2024    ECHOCARDIOGRAM REPORT   Patient Name:   LIALA CODISPOTI Date of Exam: 02/13/2024 Medical Rec #:  [id number]          Height:       64.0 in Accession #:    [id number]         Weight:       118.9  lb Date of Birth:  [birth year]          BSA:          1.568 m Patient Age:    88 years           BP:           142/72 mmHg Patient Gender: F                  HR:           71 bpm. Exam Location:  Inpatient Procedure: 2D Echo, Cardiac Doppler and Color Doppler (Both Spectral and Color             Flow Doppler were utilized during procedure). Indications:    Cardiomyopathy- Unspecified I42.9  History:        Patient has no prior history of Echocardiogram examinations.                 Risk Factors:Hypertension.  Sonographer:    Lucendia Herrlich RCS Referring Phys: [id number] Doreen Salvage AMIN IMPRESSIONS  1. Left ventricular ejection fraction, by estimation, is 65 to 70%. The left ventricle has normal function. The left ventricle has no regional wall motion abnormalities. Left ventricular diastolic parameters were normal.  2. Right ventricular systolic function is normal. The right ventricular size is normal. There is normal pulmonary artery systolic pressure. The estimated right ventricular systolic pressure is 23.8 mmHg.  3. The mitral valve is grossly normal. No evidence of mitral valve regurgitation. No evidence of mitral stenosis.  4. The aortic valve is calcified. Aortic valve regurgitation is trivial. Aortic valve sclerosis/calcification is present, without any evidence of aortic stenosis.  5. The inferior vena cava is normal in size with greater than 50% respiratory variability, suggesting right atrial pressure of 3 mmHg. FINDINGS  Left Ventricle: Left ventricular ejection fraction, by estimation, is 65 to 70%. The left ventricle has normal function. The left ventricle has no regional wall motion abnormalities. Strain imaging was not performed. The left ventricular internal cavity  size was normal in size. There is no left ventricular hypertrophy. Left ventricular diastolic parameters were normal. Right Ventricle: The right ventricular size is normal. No increase in right ventricular wall thickness. Right ventricular systolic function is normal. There is normal pulmonary artery systolic pressure. The tricuspid regurgitant velocity is 2.28 m/s, and  with an assumed right atrial pressure of 3 mmHg, the estimated right ventricular systolic pressure is 23.8 mmHg. Left Atrium: Left atrial size was normal in size.  Right Atrium: Right atrial size was normal in size. Pericardium: There is no evidence of pericardial effusion. Mitral Valve: The mitral valve is grossly normal. No evidence of mitral valve regurgitation. No evidence of mitral valve stenosis. Tricuspid Valve: The tricuspid valve is grossly normal. Tricuspid valve regurgitation is trivial. No evidence of tricuspid stenosis. Aortic Valve: The aortic valve is calcified. Aortic valve regurgitation is trivial. Aortic regurgitation PHT measures 299 msec. Aortic valve sclerosis/calcification is present, without any evidence of aortic stenosis. Aortic valve mean gradient measures 9.0 mmHg. Aortic valve peak gradient measures 11.1 mmHg. Aortic valve area, by VTI measures 1.23 cm. Pulmonic Valve: The pulmonic valve was grossly normal. Pulmonic valve regurgitation is trivial. No evidence of pulmonic stenosis. Aorta: The aortic root and ascending aorta are structurally normal, with no evidence of dilitation. Venous: The inferior vena cava is normal in size with greater than 50% respiratory variability, suggesting right atrial pressure of 3 mmHg. IAS/Shunts: The atrial septum is grossly normal. Additional  Comments: 3D imaging was not performed. Mild ascites is present.  LEFT VENTRICLE PLAX 2D LVIDd:         4.00 cm   Diastology LVIDs:         2.60 cm   LV e' medial:    9.25 cm/s LV PW:         0.90 cm   LV E/e' medial:  6.4 LV IVS:        1.00 cm   LV e' lateral:   12.20 cm/s LVOT diam:     1.80 cm   LV E/e' lateral: 4.9 LV SV:         48 LV SV Index:   31 LVOT Area:     2.54 cm  RIGHT VENTRICLE             IVC RV S prime:     11.00 cm/s  IVC diam: 1.20 cm TAPSE (M-mode): 1.8 cm LEFT ATRIUM             Index        RIGHT ATRIUM           Index LA diam:        3.70 cm 2.36 cm/m   RA Area:     12.50 cm LA Vol (A2C):   29.1 ml 18.55 ml/m  RA Volume:   29.10 ml  18.55 ml/m LA Vol (A4C):   40.0 ml 25.50 ml/m LA Biplane Vol: 34.3 ml 21.87 ml/m  AORTIC VALVE                      PULMONIC VALVE AV Area (Vmax):    1.61 cm      PR End Diast Vel: 2.12 msec AV Area (Vmean):   1.25 cm AV Area (VTI):     1.23 cm AV Vmax:           166.67 cm/s AV Vmean:          138.000 cm/s AV VTI:            0.392 m AV Peak Grad:      11.1 mmHg AV Mean Grad:      9.0 mmHg LVOT Vmax:         105.50 cm/s LVOT Vmean:        67.950 cm/s LVOT VTI:          0.190 m LVOT/AV VTI ratio: 0.48 AI PHT:            299 msec  AORTA Ao Root diam: 3.10 cm Ao Asc diam:  2.80 cm MITRAL VALVE               TRICUSPID VALVE MV Area (PHT): 3.27 cm    TR Peak grad:   20.8 mmHg MV Decel Time: 232 msec    TR Vmax:        228.00 cm/s MV E velocity: 59.60 cm/s MV A velocity: 76.60 cm/s  SHUNTS MV E/A ratio:  0.78        Systemic VTI:  0.19 m                            Systemic Diam: 1.80 cm Lennie Odor MD Electronically signed by Lennie Odor MD Signature Date/Time: 02/13/2024/8:53:15 PM    Final    VAS [place] LOWER EXTREMITY VENOUS (DVT) Result Date: 02/12/2024  Lower Venous DVT Study Patient Name:  LONNETTE SHRODE  Date  of Exam:   02/12/2024 Medical Rec #: [id number]           Accession #:    [id number] Date of Birth: 08/19/1935           Patient Gender: F Patient Age:   48 years Exam Location:  [facility] Procedure:      VAS [place] LOWER EXTREMITY VENOUS (DVT) Referring Phys: Stephania Fragmin --------------------------------------------------------------------------------  Indications: Pulmonary embolism.  Risk Factors: Newly diagnosed peritoneal carcinomatosis. Comparison Study: No previous exams Performing Technologist: Jody Hill RVT, RDMS  Examination Guidelines: A complete evaluation includes B-mode imaging, spectral Doppler, color Doppler, and power Doppler as needed of all accessible portions of each vessel. Bilateral testing is considered an integral part of a complete examination. Limited examinations for reoccurring indications may be performed as noted. The reflux portion of the exam is performed with the patient in  reverse Trendelenburg.  +---------+---------------+---------+-----------+----------+--------------+ RIGHT    CompressibilityPhasicitySpontaneityPropertiesThrombus Aging +---------+---------------+---------+-----------+----------+--------------+ CFV      Full           Yes      No                                  +---------+---------------+---------+-----------+----------+--------------+ SFJ      Full                                                        +---------+---------------+---------+-----------+----------+--------------+ FV Prox  Full           Yes      Yes                                 +---------+---------------+---------+-----------+----------+--------------+ FV Mid   Full           Yes      Yes                                 +---------+---------------+---------+-----------+----------+--------------+ FV DistalFull           Yes      Yes                                 +---------+---------------+---------+-----------+----------+--------------+ PFV      Full                                                        +---------+---------------+---------+-----------+----------+--------------+ POP      Full           Yes      Yes                                 +---------+---------------+---------+-----------+----------+--------------+ PTV      Full                                                        +---------+---------------+---------+-----------+----------+--------------+  PERO     Full                                                        +---------+---------------+---------+-----------+----------+--------------+   +---------+---------------+---------+-----------+----------+--------------+ LEFT     CompressibilityPhasicitySpontaneityPropertiesThrombus Aging +---------+---------------+---------+-----------+----------+--------------+ CFV      Full           Yes      Yes                                  +---------+---------------+---------+-----------+----------+--------------+ SFJ      Full                                                        +---------+---------------+---------+-----------+----------+--------------+ FV Prox  Full           Yes      Yes                                 +---------+---------------+---------+-----------+----------+--------------+ FV Mid   Full           Yes      Yes                                 +---------+---------------+---------+-----------+----------+--------------+ FV DistalFull           Yes      Yes                                 +---------+---------------+---------+-----------+----------+--------------+ PFV      Full                                                        +---------+---------------+---------+-----------+----------+--------------+ POP      Full           Yes      Yes                                 +---------+---------------+---------+-----------+----------+--------------+ PTV      None           No       No                   Acute          +---------+---------------+---------+-----------+----------+--------------+ PERO     None           No       No                   Acute          +---------+---------------+---------+-----------+----------+--------------+     Summary: BILATERAL: -No evidence of popliteal cyst, bilaterally. RIGHT: - There is no evidence  of deep vein thrombosis in the lower extremity.  LEFT: - Findings consistent with acute deep vein thrombosis involving the left peroneal veins, and left posterior tibial veins.   *See table(s) above for measurements and observations. Electronically signed by Carolynn Sayers on 02/12/2024 at 8:18:12 PM.    Final    CT CHEST W CONTRAST Addendum Date: 02/11/2024 ADDENDUM REPORT: 02/11/2024 15:54 ADDENDUM: Critical Value/emergent results were called by telephone at the time of interpretation on 02/11/2024 at 3:54 pm to provider Eugene Garnet, MD, who  verbally acknowledged these results. Electronically Signed   By: Delbert Phenix M.D.   On: 02/11/2024 15:54   Result Date: 02/11/2024 CLINICAL DATA:  Peritoneal carcinomatosis. Chest staging. * Tracking Code: BO * EXAM: CT CHEST WITH CONTRAST TECHNIQUE: Multidetector CT imaging of the chest was performed during intravenous contrast administration. RADIATION DOSE REDUCTION: This exam was performed according to the departmental dose-optimization program which includes automated exposure control, adjustment of the mA and/or kV according to patient size and/or use of iterative reconstruction technique. CONTRAST:  75mL OMNIPAQUE IOHEXOL 300 MG/ML  SOLN COMPARISON:  02/04/2024 CT abdomen/pelvis. 02/08/2024 chest radiograph. FINDINGS: Cardiovascular: Normal heart size. No significant pericardial effusion/thickening. Left anterior descending coronary atherosclerosis. Right PICC terminates in the middle third of the SVC. Atherosclerotic nonaneurysmal thoracic aorta. Incidental acute pulmonary embolism involving the distal right pulmonary artery and lobar and segmental right upper lobe and right middle lobe pulmonary artery branches. No saddle embolus. Normal caliber main pulmonary artery. RV/LV ratio 1.0. Mediastinum/Nodes: No significant thyroid nodules. Fluid level in the mid to lower thoracic esophagus with enteric tube terminating in the proximal stomach. No right axillary adenopathy. Mild left axillary adenopathy up to 1.1 cm (series 2/image 61). No pathologically enlarged mediastinal or hilar nodes. Lungs/Pleura: No pneumothorax. Small layering right pleural effusion. No left pleural effusion. Calcified 1 cm posterior right lower lobe granuloma. Mild dependent bilateral lower lobe atelectasis. No acute consolidative airspace disease, lung masses or significant pulmonary nodules. Upper abdomen: Moderate upper abdominal ascites with nodular soft tissue caking in the visualized anterior left upper peritoneum, as seen on  recent CT abdomen study. Several hypodense liver lesions scattered throughout the visualized liver, unchanged, largest 1.6 cm in the inferior liver on series 2/image 155. Musculoskeletal: No aggressive appearing focal osseous lesions. Mild thoracic spondylosis. IMPRESSION: 1. Incidental acute pulmonary embolism involving the distal right pulmonary artery and lobar and segmental right upper lobe and right middle lobe pulmonary artery branches. No saddle embolus. Normal caliber main pulmonary artery. Borderline elevated RV/LV Ratio = 1.0. 2. Small layering right pleural effusion. Mild dependent bilateral lower lobe atelectasis. 3. Mild left axillary adenopathy, cannot exclude metastatic disease. No other evidence of metastatic disease in the chest. 4. One vessel coronary atherosclerosis. 5. Moderate upper abdominal ascites with nodular soft tissue caking in the visualized anterior left upper peritoneum, as seen on recent CT abdomen study, compatible with peritoneal carcinomatosis. Several hypodense liver lesions scattered throughout the visualized liver, unchanged, suspicious for liver metastases. Electronically Signed: By: Delbert Phenix M.D. On: 02/11/2024 15:28   MR PELVIS W WO CONTRAST Result Date: 02/11/2024 CLINICAL DATA:  "Occult malignancy". Peritoneal carcinomatosis on abdominopelvic CT with bowel obstruction. EXAM: MRI PELVIS WITHOUT AND WITH CONTRAST TECHNIQUE: Multiplanar multisequence MR imaging of the pelvis was performed both before and after administration of intravenous contrast. CONTRAST:  5mL GADAVIST GADOBUTROL 1 MMOL/ML IV SOLN COMPARISON:  CT of 02/04/2024 FINDINGS: Urinary Tract: Primarily decompressed urinary bladder. Left renal sinus cysts without hydronephrosis. Bowel: Fluid-filled small  bowel loops within the upper and mid pelvis measure up to 2.0 cm on 19/17. Improved from 3.4 cm on the prior exam. Again identified is an intussusception of the sigmoid into the rectum. There is mild  hyperenhancement within the lead point including on 41/17, without restricted diffusion in this area. Vascular/Lymphatic: Ileocolic mesenteric adenopathy including at 2.2 x 1.8 cm on 08/17. No pelvic sidewall adenopathy. Reproductive: The endometrium is thickened for age including at 1.0 cm on 18/4. This is followed to the level of the cervix and lower uterine segment, where soft tissue fullness is again identified including on 18/4 and 21/3. Correlate restricted diffusion on 22/11. Example 3.7 x 3.7 cm on 31/17. There is amorphous soft tissue fullness within both adnexa, greater right than left. Example restricted diffusion on 17/11. The right adnexal soft tissue fullness measures 3.3 x 3.3 cm on 18/17. No separate ovarian tissue identified. Other: Peritoneal carcinomatosis, as evidenced by diffuse peritoneal enhancement and restricted diffusion (example 22/11). Small volume pelvic fluid. Musculoskeletal: No acute osseous abnormality. IMPRESSION: 1. Abnormal amorphous soft tissue throughout lower uterine segment and cervix could represent primary uterine/cervical carcinoma or metastatic disease. 2. Diffuse peritoneal carcinomatosis as better imaged on dedicated CT. Ileocolic mesenteric nodal metastasis. 3. Right greater than left adnexal soft tissue fullness likely represents ovarian metastasis. 4. Chronic or recurrent sigmoid/rectal intussusception since 02/04/2024. Vague hyperenhancement in the region of the lead point, without dominant mass. Recommend physical exam correlation to exclude either primary rectosigmoid carcinoma or serosal sigmoid implant lead point. 5. Small volume pelvic fluid. 6. Improved small bowel dilatation since prior CT. Electronically Signed   By: Jeronimo Greaves M.D.   On: 02/11/2024 15:42   DG Abd 1 View Result Date: 02/10/2024 CLINICAL DATA:  [address] Abdominal distention [id number] [phone] SBO (small bowel obstruction) (HCC) [id number] EXAM: ABDOMEN - 1 VIEW COMPARISON:  02/08/2024 FINDINGS: NG  tube terminates within the proximal stomach. Worsening small bowel dilation in the mid abdomen measuring up to 5.3 cm (previously 3.3 cm. Paucity of bowel gas within the colon. No gross free intraperitoneal air. IMPRESSION: 1. Worsening small bowel obstruction. 2. NG tube terminates within the proximal stomach. Recommend advancement. Electronically Signed   By: Duanne Guess D.O.   On: 02/10/2024 09:35   DG Chest 1 View Result Date: 02/08/2024 CLINICAL DATA:  PICC line placement EXAM: CHEST  1 VIEW COMPARISON:  02/05/2024 FINDINGS: Right PICC line in place with the tip in the SVC. NG tube is in the stomach. Heart and mediastinal contours within normal limits. No confluent opacities or effusions. No acute bony abnormality. IMPRESSION: Right PICC line tip in the SVC. No active cardiopulmonary disease. Electronically Signed   By: Charlett Nose M.D.   On: 02/08/2024 17:51   [place] EKG SITE RITE Result Date: 02/08/2024 If Site Rite image not attached, placement could not be confirmed due to current cardiac rhythm.  DG Abd Portable 1V Result Date: 02/08/2024 CLINICAL DATA:  Check gastric catheter placement EXAM: PORTABLE ABDOMEN - 1 VIEW COMPARISON:  02/04/2024 FINDINGS: Scattered large and small bowel gas is noted. Mild small bowel dilatation is noted significantly improved when compared with the prior exam. Gastric catheter is noted with the tip in the stomach. No free air is noted. IMPRESSION: Persistent but significantly improved small bowel dilatation when compared with the prior exam. Electronically Signed   By: Alcide Clever M.D.   On: 02/08/2024 09:34   US Paracentesis Result Date: 02/05/2024 INDICATION: 89 year old with new abdominal distention, ascites. Request  made for diagnostic and therapeutic paracentesis. EXAM: ULTRASOUND GUIDED DIAGNOSTIC  PARACENTESIS MEDICATIONS: 10 mL 1% lidocaine. COMPLICATIONS: None immediate. PROCEDURE: Informed written consent was obtained from the patient after a discussion  of the risks, benefits and alternatives to treatment. A timeout was performed prior to the initiation of the procedure. Initial ultrasound scanning demonstrates a small amount of ascites within the left lateral abdomen interlooped with bowel. The left lateral abdomen was prepped and draped in the usual sterile fashion. 1% lidocaine was used for local anesthesia. Following this, a 19 gauge, 7-cm, Yueh catheter was introduced. An ultrasound image was saved for documentation purposes. The paracentesis was performed. The catheter was removed and a dressing was applied. The patient tolerated the procedure well without immediate post procedural complication. FINDINGS: A total of approximately 100 mL of yellow fluid was removed. Samples were sent to the laboratory as requested by the clinical team. IMPRESSION: Successful ultrasound-guided paracentesis yielding 100 mL of peritoneal fluid. Performed by: Loyce Dys PA-C Electronically Signed   By: Malachy Moan M.D.   On: 02/05/2024 14:31   US PELVIC COMPLETE WITH TRANSVAGINAL Result Date: 02/05/2024 CLINICAL DATA:  89 year old female with ascites, peritoneal carcinomatosis on CT Abdomen and Pelvis yesterday. EXAM: TRANSABDOMINAL AND TRANSVAGINAL ULTRASOUND OF PELVIS TECHNIQUE: Both transabdominal and transvaginal ultrasound examinations of the pelvis were performed. Transabdominal technique was performed for global imaging of the pelvis including uterus, ovaries, adnexal regions, and pelvic cul-de-sac. It was necessary to proceed with endovaginal exam following the transabdominal exam to visualize the ovaries. COMPARISON:  CT Abdomen and Pelvis 02/04/2024. FINDINGS: Uterus Measurements: 6.9 x 3.9 x 3.9 cm = volume: 41 mL. No obvious myometrial mass. Endometrium Heterogeneous hypoechoic area at the fundus endometrium (image 49 of series 1). This area encompasses about 2 cm (image 55). See also cine series 3 images. Otherwise the echogenic endometrial stripe is up to  5 mm. Right ovary Measurements: 3.5 x 2.1 x 2.8 cm = volume: 11 mL. No ovarian mass identified. Left ovary Measurements: 2.3 x 1.5 x 1.7 cm = volume: 3 mL. No ovarian mass identified. Other findings Small volume ascites again visible in the pelvis. IMPRESSION: 1. No ovarian mass identified by ultrasound. 2. There is a 2 cm heterogeneous area at the uterine fundus which is favored to be endometrial related. Elsewhere the endometrial stripe is about 5 mm. An Endometrial Malignancy is not excluded. 3. Ascites, in conjunction with the extensive additional abdominal and pelvic abnormalities demonstrated by CT yesterday. Electronically Signed   By: Odessa Fleming M.D.   On: 02/05/2024 12:17   DG Chest Portable 1 View Result Date: 02/05/2024 CLINICAL DATA:  NG tube replacement confirmation EXAM: PORTABLE CHEST - 1 VIEW COMPARISON:  02/04/2024 FINDINGS: Cardiomediastinal silhouette and pulmonary vasculature are within normal limits. Lungs are clear. Nasogastric tube extends to the left upper quadrant the side hole located at the level of the gastroesophageal junction. It is slightly retracted compared to prior exam. IMPRESSION: Nasogastric tube terminates in the left upper quadrant in the expected site of the stomach. The side hole of the NG tube is located at the level of the gastroesophageal junction. Electronically Signed   By: Acquanetta Belling M.D.   On: 02/05/2024 07:38   DG Chest Portable 1 View Result Date: 02/04/2024 CLINICAL DATA:  NG tube placement. Abdominal discomfort, heartburn, and nausea. EXAM: PORTABLE CHEST 1 VIEW COMPARISON:  None Available. FINDINGS: The heart size and mediastinal contours are within normal limits. There is atherosclerotic calcification of the aorta. No  consolidation, effusion, or pneumothorax. An enteric tube terminates in the stomach. No acute osseous abnormality. IMPRESSION: 1. No active disease. 2. Enteric tube terminates in the stomach. Electronically Signed   By: Thornell Sartorius M.D.   On:  02/04/2024 21:20   CT ABDOMEN PELVIS W CONTRAST Result Date: 02/04/2024 CLINICAL DATA:  Abdominal pain, bloating, nausea and vomiting for 2 weeks. * Tracking Code: BO * EXAM: CT ABDOMEN AND PELVIS WITH CONTRAST TECHNIQUE: Multidetector CT imaging of the abdomen and pelvis was performed using the standard protocol following bolus administration of intravenous contrast. RADIATION DOSE REDUCTION: This exam was performed according to the departmental dose-optimization program which includes automated exposure control, adjustment of the mA and/or kV according to patient size and/or use of iterative reconstruction technique. CONTRAST:  OMNIPAQUE IOHEXOL 300 MG/ML  SOLN COMPARISON:  None Available. FINDINGS: Lower Chest: No acute findings. Hepatobiliary: A few tiny hepatic cysts are seen, however, there are multiple hypovascular masses involving the right and left hepatic lobes, largest in the central right hepatic lobe measuring 1.9 x 1.5 cm on image 26/2. These are consistent with liver metastases. Gallbladder is unremarkable. No evidence of biliary ductal dilatation. Pancreas:  No mass or inflammatory changes. Spleen: Within normal limits in size and appearance. Adrenals/Urinary Tract: No suspicious masses identified. No evidence of ureteral calculi or hydronephrosis. Stomach/Bowel: Distal small bowel obstruction, with transition point in the right lower quadrant, likely due to peritoneal carcinoma. Rectal intussusception incidentally noted. Vascular/Lymphatic: Mild retroperitoneal lymphadenopathy in the left para-aortic and aortocaval spaces. No acute vascular findings. Reproductive: A 1.6 cm peripherally calcified fibroid is seen in the uterine fundus. Poorly defined masslike soft tissue prominence is seen in the region of the cervix and lower uterine segment measuring 3.6 x 3.3 cm on image 63/2, possibly representing a primary cervical or endometrial carcinoma. Poorly defined soft tissue density is seen in  both adnexal regions and along the uterine fundus, consistent with peritoneal carcinomatosis. Other: Mild ascites is seen. Peritoneal and omental soft tissue nodules and masses are seen throughout the abdomen and pelvis, consistent with peritoneal carcinomatosis. Largest conglomerate mass is seen in the right abdomen measuring approximately 6.7 x 3.4 cm on image 37/2. Musculoskeletal:  No suspicious bone lesions identified. IMPRESSION: Diffuse peritoneal carcinomatosis and mild ascites. Distal small bowel obstruction, with transition point in the right lower quadrant, likely due to peritoneal carcinomatosis. Masslike soft tissue prominence in the region of the lower uterine segment and cervix, possibly representing primary cervical or endometrial carcinoma. Liver metastases. Mild retroperitoneal lymphadenopathy, consistent with metastatic disease. Rectal intussusception incidentally noted. No definite lead mass identified. Electronically Signed   By: Danae Orleans M.D.   On: 02/04/2024 11:34    Microbiology: Results for orders placed or performed during the hospital encounter of 02/04/24  Body fluid culture w Gram Stain     Status: None   Collection Time: 02/05/24  2:14 PM   Specimen: PATH Cytology Pleural fluid  Result Value Ref Range Status   Specimen Description   Final    PLEURAL Performed at [facility], [address] [place], [place] [address]    Special Requests   Final    NONE Performed at [facility], [address] [place], [place] [address]    Gram Stain WBC SEEN NO ORGANISMS SEEN CYTOSPIN SMEAR   Final   Culture   Final    NO GROWTH 3 DAYS Performed at [facility], [address] [place], [place] [address]  Report Status 02/09/2024 FINAL  Final    Labs: CBC: Recent Labs  Lab 02/24/24 0601 02/25/24 0553 02/26/24 1026 02/27/24 0525 02/28/24 0750  WBC 10.8* 10.8* 10.9* 10.2 7.3  NEUTROABS 7.5 7.4 7.8* 6.4 4.5  HGB  8.7* 9.2* 9.8* 9.7* 9.7*  HCT 26.7* 28.8* 30.0* 30.4* 30.7*  MCV 95.4 95.4 95.2 95.9 98.4  PLT 204 200 200 209 186   Basic Metabolic Panel: Recent Labs  Lab 02/26/24 1026 02/27/24 0525 02/28/24 0750 02/29/24 0545 03/01/24 0517  NA 134* 135 135 135 135  K 4.2 4.2 3.7 3.9 4.2  CL 99 100 102 101 105  CO2 25 26 26 26 24   GLUCOSE 127* 104* 102* 95 102*  BUN 32* 35* 33* 36* 40*  CREATININE 0.72 0.86 0.79 0.81 0.77  CALCIUM 8.6* 8.5* 8.3* 8.0* 8.1*  MG 1.9 2.0 1.8 2.0 2.1  PHOS 4.0 4.1 3.3 2.9 3.1   Liver Function Tests: Recent Labs  Lab 02/24/24 0601 02/28/24 0750  AST 21 19  ALT 37 25  ALKPHOS 176* 134*  BILITOT 0.5 0.4  PROT 5.5* 5.7*  ALBUMIN 2.4* 2.8*   CBG: Recent Labs  Lab 02/29/24 1617 02/29/24 1957 03/01/24 0005 03/01/24 0743 03/01/24 1205  GLUCAP 104* 203* 124* 84 98    Discharge time spent: 45 minutes.   Signed: Kathlen Mody, MD [facility] 03/01/2024

## 2024-03-01 NOTE — Telephone Encounter (Signed)
 Faxed over Signed Prescriber orders to [facility] per request. Success. Contacted Ocean[employer], confirmed/received fax.

## 2024-03-01 NOTE — Progress Notes (Signed)
 [facility]                                                                               Tracy Preston, is a 89 y.o. female, DOB - 1935/02/14, UVO:[id number] Admit date - 02/04/2024    Outpatient Primary MD for the patient is Marden Noble, MD (Inactive)  LOS - 26  days    Brief summary   Patient is a 89 year old female with history of hypertension, breast cancer who presented with abdominal distention, vomiting for 2 weeks. CT abdomen was concerning for new metastatic disease from possible GYN origin, SBO. Further workup showed peritoneal carcinomatosis with likely GYN malignancy. GYN oncology, general surgery consulted pelvic MRI showed advanced GYN malignancy. CT chest with contrast showed distal pulmonary PE present with cor pulmonale, started on heparin drip. Received first cycle of chemo on 2/15. Hospital course remarkable for persistent bowel obstruction, started on TPN, Decadron. Palliative care following. Now having bowel movements. Small bowel SBO protocol shows nondilated colon with contrast on entire colon. NG tube removed on 2/22, started on clears, tolerating , slowly advanced to full liquid diet.   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  TPN per [facility] protocol for labs and dosing. TPN for Long term need. Patient is not able to meet nutritional requirements with enteral feedings.  She will need TPN to meet the nutritional requirements.   Malnutrition Type:  Nutrition Problem: Moderate Malnutrition Etiology: acute illness   Malnutrition Characteristics:  Signs/Symptoms: severe fat depletion, moderate fat depletion   Nutrition Interventions:  Interventions: TPN  Estimated body mass index is 21.08 kg/m as calculated from the following:   Height as of this encounter: 5'  4" (1.626 m).   Weight as of this encounter: 55.7 kg.  Code Status:  DVT Prophylaxis:   apixaban (ELIQUIS) tablet 10 mg  apixaban (ELIQUIS) tablet 5 mg   Level of Care: Level of care: Med-Surg     Procedures:    Consultants:   Oncology.   Antimicrobials:   Anti-infectives (From admission, onward)    Start     Dose/Rate Route Frequency Ordered Stop   02/29/24 1045  fluconazole (DIFLUCAN) tablet 100 mg        100 mg Oral  Once 02/29/24 0957 02/29/24 1012   02/29/24 1030  fluconazole (DIFLUCAN) IVPB 100 mg  Status:  Discontinued        100 mg 50 mL/hr over 60 Minutes Intravenous  Once 02/29/24 0936 02/29/24 0957        Medications  Scheduled Meds:  apixaban  10 mg Oral BID   Followed by   Melene Muller ON 03/04/2024] apixaban  5 mg Oral BID   Chlorhexidine Gluconate Cloth  6 each Topical Daily   [START ON 03/05/2024] dexamethasone  2 mg Oral Daily   dexamethasone  4 mg Oral Daily   insulin aspart  0-9 Units Subcutaneous 4 times per day on Sunday Monday Tuesday Wednesday Thursday Friday   nystatin  5 mL Oral QID   octreotide  100 mcg Subcutaneous Q8H   pantoprazole  40 mg Oral Daily   sodium chloride flush  10-40 mL Intracatheter Q12H   Continuous Infusions: PRN Meds:.acetaminophen, guaiFENesin, iohexol, ipratropium-albuterol, lip balm, magic mouthwash w/lidocaine, menthol-cetylpyridinium, morphine injection, ondansetron (ZOFRAN) IV, phenol, prochlorperazine, sodium chloride flush, traZODone    Subjective:   Tracy Preston was seen and examined today.    Objective:   Vitals:   02/29/24 0448 02/29/24 1448 02/29/24 2000 03/01/24 0540  BP: (!) 106/56 126/72 114/71 111/67  Pulse: 62 71 (!) 59 66  Resp: 16 15 18 18   Temp: 98.2 F (36.8 C) 97.7 F (36.5 C) 97.8 F (36.6 C) 98 F (36.7 C)  TempSrc: Oral Oral Oral Oral  SpO2: 95% 97% 96% 94%  Weight:      Height:        Intake/Output Summary (Last 24 hours) at 03/01/2024 1105 Last data filed at 02/29/2024  1801 Gross per 24 hour  Intake 496.82 ml  Output --  Net 496.82 ml   Filed Weights   02/21/24 0500 02/26/24 0706 02/28/24 0529  Weight: 57.2 kg 58 kg 55.7 kg     Exam General exam: Appears calm and comfortable  Respiratory system: Clear to auscultation. Respiratory effort normal. Cardiovascular system: S1 & S2 heard, RRR.  Gastrointestinal system: Abdomen is nondistended, soft and nontender. Central nervous system: Alert and oriented.  Extremities: Symmetric 5 x 5 power. Skin: No rashes,  Psychiatry: Mood & affect appropriate.    Data Reviewed:  I have personally reviewed following labs and imaging studies   CBC Lab Results  Component Value Date   WBC 7.3 02/28/2024   RBC 3.12 (L) 02/28/2024   HGB 9.7 (L) 02/28/2024   HCT 30.7 (L) 02/28/2024   MCV 98.4 02/28/2024   MCH 31.1 02/28/2024   PLT 186 02/28/2024   MCHC 31.6 02/28/2024   RDW 14.4 02/28/2024   LYMPHSABS 1.7 02/28/2024   MONOABS 0.7 02/28/2024   EOSABS 0.0 02/28/2024   BASOSABS 0.0 02/28/2024     Last metabolic panel Lab Results  Component Value Date   NA 135 03/01/2024   K 4.2 03/01/2024   CL 105 03/01/2024   CO2 24 03/01/2024   BUN 40 (H) 03/01/2024   CREATININE 0.77 03/01/2024   GLUCOSE 102 (H) 03/01/2024   GFRNONAA >60 03/01/2024   CALCIUM 8.1 (L) 03/01/2024   PHOS 3.1 03/01/2024   PROT 5.7 (L) 02/28/2024   ALBUMIN 2.8 (L) 02/28/2024   BILITOT 0.4 02/28/2024   ALKPHOS 134 (H) 02/28/2024   AST 19 02/28/2024   ALT 25 02/28/2024   ANIONGAP 6 03/01/2024    CBG (last 3)  Recent Labs    02/29/24 1957 03/01/24 0005 03/01/24 0743  GLUCAP 203* 124* 84      Coagulation Profile: Recent Labs  Lab 02/25/24 0553  INR 1.0     Radiology Studies: No results found.     Kathlen Mody M.D. [facility] 03/01/2024, 11:05 AM  Available via Epic secure chat 7am-7pm After 7 pm, please refer to night coverage provider listed on amion.

## 2024-03-02 ENCOUNTER — Other Ambulatory Visit (HOSPITAL_COMMUNITY): Payer: Self-pay

## 2024-03-02 ENCOUNTER — Other Ambulatory Visit: Payer: Self-pay | Admitting: Oncology

## 2024-03-03 ENCOUNTER — Ambulatory Visit: Payer: Self-pay | Admitting: Genetic Counselor

## 2024-03-06 ENCOUNTER — Telehealth: Payer: Self-pay | Admitting: Genetic Counselor

## 2024-03-06 ENCOUNTER — Other Ambulatory Visit (HOSPITAL_COMMUNITY): Payer: Self-pay

## 2024-03-06 ENCOUNTER — Inpatient Hospital Stay: Attending: Oncology

## 2024-03-06 ENCOUNTER — Other Ambulatory Visit: Payer: Self-pay | Admitting: *Deleted

## 2024-03-06 ENCOUNTER — Encounter: Payer: Self-pay | Admitting: Oncology

## 2024-03-06 ENCOUNTER — Inpatient Hospital Stay

## 2024-03-06 ENCOUNTER — Other Ambulatory Visit (HOSPITAL_BASED_OUTPATIENT_CLINIC_OR_DEPARTMENT_OTHER): Payer: Self-pay

## 2024-03-06 ENCOUNTER — Inpatient Hospital Stay (HOSPITAL_BASED_OUTPATIENT_CLINIC_OR_DEPARTMENT_OTHER): Admitting: Oncology

## 2024-03-06 VITALS — BP 127/61 | HR 72 | Temp 98.1°F | Resp 18 | Ht 64.0 in | Wt 121.6 lb

## 2024-03-06 DIAGNOSIS — C786 Secondary malignant neoplasm of retroperitoneum and peritoneum: Secondary | ICD-10-CM

## 2024-03-06 DIAGNOSIS — I2699 Other pulmonary embolism without acute cor pulmonale: Secondary | ICD-10-CM | POA: Insufficient documentation

## 2024-03-06 DIAGNOSIS — C787 Secondary malignant neoplasm of liver and intrahepatic bile duct: Secondary | ICD-10-CM | POA: Diagnosis not present

## 2024-03-06 DIAGNOSIS — R971 Elevated cancer antigen 125 [CA 125]: Secondary | ICD-10-CM | POA: Insufficient documentation

## 2024-03-06 DIAGNOSIS — K56609 Unspecified intestinal obstruction, unspecified as to partial versus complete obstruction: Secondary | ICD-10-CM

## 2024-03-06 DIAGNOSIS — Z5111 Encounter for antineoplastic chemotherapy: Secondary | ICD-10-CM | POA: Insufficient documentation

## 2024-03-06 DIAGNOSIS — K5669 Other partial intestinal obstruction: Secondary | ICD-10-CM | POA: Diagnosis not present

## 2024-03-06 DIAGNOSIS — Z803 Family history of malignant neoplasm of breast: Secondary | ICD-10-CM | POA: Diagnosis not present

## 2024-03-06 DIAGNOSIS — C801 Malignant (primary) neoplasm, unspecified: Secondary | ICD-10-CM | POA: Diagnosis not present

## 2024-03-06 DIAGNOSIS — C569 Malignant neoplasm of unspecified ovary: Secondary | ICD-10-CM | POA: Diagnosis not present

## 2024-03-06 LAB — CMP (CANCER CENTER ONLY)
ALT: 19 U/L (ref 0–44)
AST: 16 U/L (ref 15–41)
Albumin: 3.7 g/dL (ref 3.5–5.0)
Alkaline Phosphatase: 133 U/L — ABNORMAL HIGH (ref 38–126)
Anion gap: 8 (ref 5–15)
BUN: 43 mg/dL — ABNORMAL HIGH (ref 8–23)
CO2: 29 mmol/L (ref 22–32)
Calcium: 8.6 mg/dL — ABNORMAL LOW (ref 8.9–10.3)
Chloride: 101 mmol/L (ref 98–111)
Creatinine: 0.87 mg/dL (ref 0.44–1.00)
GFR, Estimated: >60 mL/min (ref >60)
Glucose, Bld: 93 mg/dL (ref 70–99)
Potassium: 4.1 mmol/L (ref 3.5–5.1)
Sodium: 138 mmol/L (ref 135–145)
Total Bilirubin: 0.4 mg/dL (ref 0.0–1.2)
Total Protein: 6.5 g/dL (ref 6.5–8.1)

## 2024-03-06 LAB — CBC WITH DIFFERENTIAL (CANCER CENTER ONLY)
Abs Immature Granulocytes: 0.21 10*3/uL — ABNORMAL HIGH (ref 0.00–0.07)
Basophils Absolute: 0 10*3/uL (ref 0.0–0.1)
Basophils Relative: 0 %
Eosinophils Absolute: 0.1 10*3/uL (ref 0.0–0.5)
Eosinophils Relative: 1 %
HCT: 31.2 % — ABNORMAL LOW (ref 36.0–46.0)
Hemoglobin: 10.4 g/dL — ABNORMAL LOW (ref 12.0–15.0)
Immature Granulocytes: 2 %
Lymphocytes Relative: 37 %
Lymphs Abs: 4 10*3/uL (ref 0.7–4.0)
MCH: 31.4 pg (ref 26.0–34.0)
MCHC: 33.3 g/dL (ref 30.0–36.0)
MCV: 94.3 fL (ref 80.0–100.0)
Monocytes Absolute: 1.1 10*3/uL — ABNORMAL HIGH (ref 0.1–1.0)
Monocytes Relative: 10 %
Neutro Abs: 5.4 10*3/uL (ref 1.7–7.7)
Neutrophils Relative %: 50 %
Platelet Count: 207 10*3/uL (ref 150–400)
RBC: 3.31 MIL/uL — ABNORMAL LOW (ref 3.87–5.11)
RDW: 15.6 % — ABNORMAL HIGH (ref 11.5–15.5)
WBC Count: 10.8 10*3/uL — ABNORMAL HIGH (ref 4.0–10.5)
nRBC: 0 % (ref 0.0–0.2)

## 2024-03-06 LAB — GENETIC SCREENING ORDER

## 2024-03-06 LAB — GUARDANT 360

## 2024-03-06 MED ORDER — FAMOTIDINE IN NACL 20-0.9 MG/50ML-% IV SOLN
20.0000 mg | Freq: Once | INTRAVENOUS | Status: AC
  Administered 2024-03-06: 20 mg via INTRAVENOUS
  Filled 2024-03-06: qty 50

## 2024-03-06 MED ORDER — SODIUM CHLORIDE 0.9 % IV SOLN
300.0000 mg | Freq: Once | INTRAVENOUS | Status: AC
  Administered 2024-03-06: 300 mg via INTRAVENOUS
  Filled 2024-03-06: qty 30

## 2024-03-06 MED ORDER — HEPARIN SOD (PORK) LOCK FLUSH 100 UNIT/ML IV SOLN
500.0000 [IU] | Freq: Once | INTRAVENOUS | Status: DC | PRN
Start: 2024-03-06 — End: 2024-03-06

## 2024-03-06 MED ORDER — PROCHLORPERAZINE MALEATE 5 MG PO TABS
5.0000 mg | ORAL_TABLET | Freq: Four times a day (QID) | ORAL | 1 refills | Status: DC | PRN
  Filled 2024-03-06: qty 60, 8d supply, fill #0

## 2024-03-06 MED ORDER — SODIUM CHLORIDE 0.9 % IV SOLN
150.0000 mg | Freq: Once | INTRAVENOUS | Status: AC
  Administered 2024-03-06: 150 mg via INTRAVENOUS
  Filled 2024-03-06: qty 150

## 2024-03-06 MED ORDER — DEXAMETHASONE SODIUM PHOSPHATE 10 MG/ML IJ SOLN
10.0000 mg | Freq: Once | INTRAMUSCULAR | Status: AC
  Administered 2024-03-06: 10 mg via INTRAVENOUS
  Filled 2024-03-06: qty 1

## 2024-03-06 MED ORDER — SODIUM CHLORIDE 0.9% FLUSH
10.0000 mL | INTRAVENOUS | Status: DC | PRN
Start: 2024-03-06 — End: 2024-03-06

## 2024-03-06 MED ORDER — SODIUM CHLORIDE 0.9 % IV SOLN
135.0000 mg/m2 | Freq: Once | INTRAVENOUS | Status: AC
  Administered 2024-03-06: 210 mg via INTRAVENOUS
  Filled 2024-03-06: qty 35

## 2024-03-06 MED ORDER — OCTREOTIDE ACETATE 30 MG IM KIT
30.0000 mg | PACK | Freq: Once | INTRAMUSCULAR | Status: DC
  Filled 2024-03-06: qty 1

## 2024-03-06 MED ORDER — OCTREOTIDE ACETATE 200 MCG/ML IJ SOLN
100.0000 ug | Freq: Two times a day (BID) | INTRAMUSCULAR | 0 refills | Status: DC
  Filled 2024-03-06: qty 10, 10d supply, fill #0

## 2024-03-06 MED ORDER — SODIUM CHLORIDE 0.9 % IV SOLN: INTRAVENOUS | Status: DC

## 2024-03-06 MED ORDER — DIPHENHYDRAMINE HCL 50 MG/ML IJ SOLN
25.0000 mg | Freq: Once | INTRAMUSCULAR | Status: AC
  Administered 2024-03-06: 25 mg via INTRAVENOUS
  Filled 2024-03-06: qty 1

## 2024-03-06 MED ORDER — PALONOSETRON HCL INJECTION 0.25 MG/5ML
0.2500 mg | Freq: Once | INTRAVENOUS | Status: AC
  Administered 2024-03-06: 0.25 mg via INTRAVENOUS
  Filled 2024-03-06: qty 5

## 2024-03-06 NOTE — Progress Notes (Signed)
 [facility] OFFICE PROGRESS NOTE   Diagnosis: Gynecologic malignancy  INTERVAL HISTORY:   Tracy Preston was discharged in the hospital on 03/01/2024.  She reports feeling well.  She is tolerating a liquid diet.  She is having bowel movements and passing flatus.  The abdomen is less distended.  She plans to discontinue TPN today.  A right PICC remains in place.  No new complaint.  No peripheral numbness.  No pain.  Objective:  Vital signs in last 24 hours:  Blood pressure 127/61, pulse 72, temperature 98.1 F (36.7 C), temperature source Temporal, resp. rate 18, height 5\' 4"  (1.626 m), weight 121 lb 9.6 oz (55.2 kg), SpO2 100%.    HEENT: Mild white coat over the tongue, no buccal thrush or ulcers Resp: End inspiratory rhonchi at the left posterior base, no respiratory distress Cardio: Regular rate and rhythm GI: Mildly distended, soft, no mass, nontender Vascular: No leg edema  Portacath/PICC-without erythema  Lab Results:  Lab Results  Component Value Date   WBC 10.8 (H) 03/06/2024   HGB 10.4 (L) 03/06/2024   HCT 31.2 (L) 03/06/2024   MCV 94.3 03/06/2024   PLT 207 03/06/2024   NEUTROABS 5.4 03/06/2024    CMP  Lab Results  Component Value Date   NA 138 03/06/2024   K 4.1 03/06/2024   CL 101 03/06/2024   CO2 29 03/06/2024   GLUCOSE 93 03/06/2024   BUN 43 (H) 03/06/2024   CREATININE 0.87 03/06/2024   CALCIUM 8.6 (L) 03/06/2024   PROT 6.5 03/06/2024   ALBUMIN 3.7 03/06/2024   AST 16 03/06/2024   ALT 19 03/06/2024   ALKPHOS 133 (H) 03/06/2024   BILITOT 0.4 03/06/2024   GFRNONAA >60 03/06/2024    Lab Results  Component Value Date   CEA1 2.7 02/05/2024   [id number] 18 02/05/2024    Medications: I have reviewed the patient's current medications.   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 CT abdomen/pelvis 02/04/2024: Diffuse carcinomatosis and mild ascites, distal small bowel obstruction,  masslike soft tissue prominence in the region of the lower urine segment/cervix, liver metastases, mild retroperitoneal lymphadenopathy, rectal intussusception Ultrasound pelvis 02/05/2024: No ovarian mass, 2 cm heterogenous area of the uterine fundus, ascites Paracentesis 02/05/2024: Adenocarcinoma, cytokeratin 7, PAX8, and p53 positive, consistent with a gynecologic primary, ER positive; Foundation 1-HRD signature and microsatellite status cannot be determined, PIK3CA , no BRCA1 or BRCA2 alteration Endometrial biopsy 02/07/2024: Negative for endometrial intraepithelial neoplasia and malignancy MRI pelvis 02/11/2024: Abnormal soft tissue at the uterine segment and cervix, diffuse carcinomatosis, right greater than left adnexal soft tissue fullness felt to represent ovarian metastases, sigmoid/rectal intussusception, small volume pelvic fluid, and's proved small bowel dilation CT chest 02/11/2024: Acute pulmonary embolism of the distal right pulmonary artery and lobar/segmental right upper lobe and right middle lobe pulmonary artery branches, small layering right pleural effusion, 1.1 cm left axillary lymph node, upper abdominal ascites with nodular soft tissue caking in the left upper peritoneum, hypodense liver lesions Cycle 1 Taxol/carboplatin 02/12/2024 2 Taxol/carboplatin 03/06/2024 CT abdomen/pelvis 02/22/2024-persistent distal colonic intussusception, mild improvement in distal partial small bowel obstruction, colon decompressed, persistent ill-defined cervical bilateral adnexal masses, 1 hepatic metastasis slightly smaller, small right greater than left pleural effusions-new 2.  Small bowel obstruction secondary to #1-NG tube placed discontinued 02/19/2024 Octreotide 02/23/2024 3.  Nutrition-TPN 4.  Right sided pulmonary embolism noted on chest CT 02/11/2024: Heparin-transition to apixaban 02/26/2024       Disposition: Tracy Preston appears  stable.  The bowel structure and symptoms remain improved.  She is  tolerating a full liquid diet.  The plan is to begin Depot octreotide and continue the short acting octreotide for approximately 10 days.  She will complete cycle 2 Taxol/carboplatin today.  She will receive G-CSF tomorrow.  We will follow-up on the CA125 from today.  She will return for a nadir CBC next week and an office visit prior to cycle 3 Taxol/carboplatin on 03/27/2024.  We submitted peripheral blood for germline genetic testing and circulating tumor DNA NGS testing.  Thornton Papas, MD  03/06/2024  3:00 PM

## 2024-03-06 NOTE — Patient Instructions (Signed)
 [facility] - A DEPT OF [facility]  Discharge Instructions: Thank you for choosing [facility] to provide your oncology and hematology care.   If you have a lab appointment with the Cancer Center, please go directly to the Cancer Center and check in at the registration area.   Wear comfortable clothing and clothing appropriate for easy access to any Portacath or PICC line.   We strive to give you quality time with your provider. You may need to reschedule your appointment if you arrive late (15 or more minutes).  Arriving late affects you and other patients whose appointments are after yours.  Also, if you miss three or more appointments without notifying the office, you may be dismissed from the clinic at the provider's discretion.      For prescription refill requests, have your pharmacy contact our office and allow 72 hours for refills to be completed.    Today you received the following chemotherapy and/or immunotherapy agents Paclitaxel and Carboplatin   To help prevent nausea and vomiting after your treatment, we encourage you to take your nausea medication as directed.  BELOW ARE SYMPTOMS THAT SHOULD BE REPORTED IMMEDIATELY: *FEVER GREATER THAN 100.4 F (38 C) OR HIGHER *CHILLS OR SWEATING *NAUSEA AND VOMITING THAT IS NOT CONTROLLED WITH YOUR NAUSEA MEDICATION *UNUSUAL SHORTNESS OF BREATH *UNUSUAL BRUISING OR BLEEDING *URINARY PROBLEMS (pain or burning when urinating, or frequent urination) *BOWEL PROBLEMS (unusual diarrhea, constipation, pain near the anus) TENDERNESS IN MOUTH AND THROAT WITH OR WITHOUT PRESENCE OF ULCERS (sore throat, sores in mouth, or a toothache) UNUSUAL RASH, SWELLING OR PAIN  UNUSUAL VAGINAL DISCHARGE OR ITCHING   Items with * indicate a potential emergency and should be followed up as soon as possible or go to the Emergency Department if any problems should occur.  Please show the CHEMOTHERAPY ALERT CARD or  IMMUNOTHERAPY ALERT CARD at check-in to the Emergency Department and triage nurse.  Should you have questions after your visit or need to cancel or reschedule your appointment, please contact [facility]PT [facility]  Dept: [phone]  and follow the prompts.  Office hours are 8:00 a.m. to 4:30 p.m. Monday - Friday. Please note that voicemails left after 4:00 p.m. may not be returned until the following business day.  We are closed weekends and major holidays. You have access to a nurse at all times for urgent questions. Please call the main number to the clinic Dept: [phone] and follow the prompts.   For any non-urgent questions, you may also contact your provider using MyChart. We now offer e-Visits for anyone 55 and older to request care online for non-urgent symptoms. For details visit [email or web address].   Also download the MyChart app! Go to the app store, search "MyChart", open the app, select [place], and log in with your MyChart username and password.  Paclitaxel Injection What is this medication? PACLITAXEL (PAK li TAX el) treats some types of cancer. It works by slowing down the growth of cancer cells. This medicine may be used for other purposes; ask your health care provider or pharmacist if you have questions. COMMON BRAND NAME(S): Onxol, Taxol What should I tell my care team before I take this medication? They need to know if you have any of these conditions: Heart disease Liver disease Low white blood cell levels An unusual or allergic reaction to paclitaxel, other medications, foods, dyes, or preservatives If you  or your partner are pregnant or trying to get pregnant Breast-feeding How should I use this medication? This medication is injected into a vein. It is given by your care team in a hospital or clinic setting. Talk to your care team about the use of this medication in children. While it may be given to children  for selected conditions, precautions do apply. Overdosage: If you think you have taken too much of this medicine contact a poison control center or emergency room at once. NOTE: This medicine is only for you. Do not share this medicine with others. What if I miss a dose? Keep appointments for follow-up doses. It is important not to miss your dose. Call your care team if you are unable to keep an appointment. What may interact with this medication? Do not take this medication with any of the following: Live virus vaccines Other medications may affect the way this medication works. Talk with your care team about all of the medications you take. They may suggest changes to your treatment plan to lower the risk of side effects and to make sure your medications work as intended. This list may not describe all possible interactions. Give your health care provider a list of all the medicines, herbs, non-prescription drugs, or dietary supplements you use. Also tell them if you smoke, drink alcohol, or use illegal drugs. Some items may interact with your medicine. What should I watch for while using this medication? Your condition will be monitored carefully while you are receiving this medication. You may need blood work while taking this medication. This medication may make you feel generally unwell. This is not uncommon as chemotherapy can affect healthy cells as well as cancer cells. Report any side effects. Continue your course of treatment even though you feel ill unless your care team tells you to stop. This medication can cause serious allergic reactions. To reduce the risk, your care team may give you other medications to take before receiving this one. Be sure to follow the directions from your care team. This medication may increase your risk of getting an infection. Call your care team for advice if you get a fever, chills, sore throat, or other symptoms of a cold or flu. Do not treat yourself. Try  to avoid being around people who are sick. This medication may increase your risk to bruise or bleed. Call your care team if you notice any unusual bleeding. Be careful brushing or flossing your teeth or using a toothpick because you may get an infection or bleed more easily. If you have any dental work done, tell your dentist you are receiving this medication. Talk to your care team if you may be pregnant. Serious birth defects can occur if you take this medication during pregnancy. Talk to your care team before breastfeeding. Changes to your treatment plan may be needed. What side effects may I notice from receiving this medication? Side effects that you should report to your care team as soon as possible: Allergic reactions--skin rash, itching, hives, swelling of the face, lips, tongue, or throat Heart rhythm changes--fast or irregular heartbeat, dizziness, feeling faint or lightheaded, chest pain, trouble breathing Increase in blood pressure Infection--fever, chills, cough, sore throat, wounds that don't heal, pain or trouble when passing urine, general feeling of discomfort or being unwell Low blood pressure--dizziness, feeling faint or lightheaded, blurry vision Low red blood cell level--unusual weakness or fatigue, dizziness, headache, trouble breathing Painful swelling, warmth, or redness of the skin, blisters or sores  at the infusion site Pain, tingling, or numbness in the hands or feet Slow heartbeat--dizziness, feeling faint or lightheaded, confusion, trouble breathing, unusual weakness or fatigue Unusual bruising or bleeding Side effects that usually do not require medical attention (report to your care team if they continue or are bothersome): Diarrhea Hair loss Joint pain Loss of appetite Muscle pain Nausea Vomiting This list may not describe all possible side effects. Call your doctor for medical advice about side effects. You may report side effects to FDA at  1-800-FDA-1088. Where should I keep my medication? This medication is given in a hospital or clinic. It will not be stored at home. NOTE: This sheet is a summary. It may not cover all possible information. If you have questions about this medicine, talk to your doctor, pharmacist, or health care provider.  2024 Elsevier/Gold Standard (2022-05-05 00:00:00)  Carboplatin Injection What is this medication? CARBOPLATIN (KAR boe pla tin) treats some types of cancer. It works by slowing down the growth of cancer cells. This medicine may be used for other purposes; ask your health care provider or pharmacist if you have questions. COMMON BRAND NAME(S): Paraplatin What should I tell my care team before I take this medication? They need to know if you have any of these conditions: Blood disorders Hearing problems Kidney disease Recent or ongoing radiation therapy An unusual or allergic reaction to carboplatin, cisplatin, other medications, foods, dyes, or preservatives Pregnant or trying to get pregnant Breast-feeding How should I use this medication? This medication is injected into a vein. It is given by your care team in a hospital or clinic setting. Talk to your care team about the use of this medication in children. Special care may be needed. Overdosage: If you think you have taken too much of this medicine contact a poison control center or emergency room at once. NOTE: This medicine is only for you. Do not share this medicine with others. What if I miss a dose? Keep appointments for follow-up doses. It is important not to miss your dose. Call your care team if you are unable to keep an appointment. What may interact with this medication? Medications for seizures Some antibiotics, such as amikacin, gentamicin, neomycin, streptomycin, tobramycin Vaccines This list may not describe all possible interactions. Give your health care provider a list of all the medicines, herbs,  non-prescription drugs, or dietary supplements you use. Also tell them if you smoke, drink alcohol, or use illegal drugs. Some items may interact with your medicine. What should I watch for while using this medication? Your condition will be monitored carefully while you are receiving this medication. You may need blood work while taking this medication. This medication may make you feel generally unwell. This is not uncommon, as chemotherapy can affect healthy cells as well as cancer cells. Report any side effects. Continue your course of treatment even though you feel ill unless your care team tells you to stop. In some cases, you may be given additional medications to help with side effects. Follow all directions for their use. This medication may increase your risk of getting an infection. Call your care team for advice if you get a fever, chills, sore throat, or other symptoms of a cold or flu. Do not treat yourself. Try to avoid being around people who are sick. Avoid taking medications that contain aspirin, acetaminophen, ibuprofen, naproxen, or ketoprofen unless instructed by your care team. These medications may hide a fever. Be careful brushing or flossing your teeth or  using a toothpick because you may get an infection or bleed more easily. If you have any dental work done, tell your dentist you are receiving this medication. Talk to your care team if you wish to become pregnant or think you might be pregnant. This medication can cause serious birth defects. Talk to your care team about effective forms of contraception. Do not breast-feed while taking this medication. What side effects may I notice from receiving this medication? Side effects that you should report to your care team as soon as possible: Allergic reactions--skin rash, itching, hives, swelling of the face, lips, tongue, or throat Infection--fever, chills, cough, sore throat, wounds that don't heal, pain or trouble when passing  urine, general feeling of discomfort or being unwell Low red blood cell level--unusual weakness or fatigue, dizziness, headache, trouble breathing Pain, tingling, or numbness in the hands or feet, muscle weakness, change in vision, confusion or trouble speaking, loss of balance or coordination, trouble walking, seizures Unusual bruising or bleeding Side effects that usually do not require medical attention (report to your care team if they continue or are bothersome): Hair loss Nausea Unusual weakness or fatigue Vomiting This list may not describe all possible side effects. Call your doctor for medical advice about side effects. You may report side effects to FDA at 1-800-FDA-1088. Where should I keep my medication? This medication is given in a hospital or clinic. It will not be stored at home. NOTE: This sheet is a summary. It may not cover all possible information. If you have questions about this medicine, talk to your doctor, pharmacist, or health care provider.  2024 Elsevier/Gold Standard (2022-04-07 00:00:00)

## 2024-03-06 NOTE — Progress Notes (Signed)
 Patient needs urgent referral to dietican per Dr. Truett Perna. Referral order placed and scheduler notified.

## 2024-03-06 NOTE — Telephone Encounter (Signed)
 POC genetic testing requested by Dr. Kalman Drape team.  Lendon Collar TRF submitted for BRCAPlus with reflex to CancerNext-Expanded +RNAinsight Panel.    The BRCAPlus gene panel offered by [facility] and includes sequencing and rearrangement analysis for the following 13 genes: ATM, BARD1, BRCA1, BRCA2, CDH1, CHEK2, NF1, PALB2, PTEN, RAD51C, RAD51D, STK11 and TP53.  The CancerNext-Expanded gene panel offered by [facility] and includes sequencing, rearrangement, and RNA analysis for the following 76 genes: AIP, ALK, APC, ATM, AXIN2, BAP1, BARD1, BMPR1A, BRCA1, BRCA2, BRIP1, CDC73, CDH1, CDK4, CDKN1B, CDKN2A, CEBPA, CHEK2, CTNNA1, DDX41, DICER1, ETV6, FH, FLCN, GATA2, LZTR1, MAX, MBD4, MEN1, MET, MLH1, MSH2, MSH3, MSH6, MUTYH, NF1, NF2, NTHL1, PALB2, PHOX2B, PMS2, POT1, PRKAR1A, PTCH1, PTEN, RAD51C, RAD51D, RB1, RET, RUNX1, SDHA, SDHAF2, SDHB, SDHC, SDHD, SMAD4, SMARCA4, SMARCB1, SMARCE1, STK11, SUFU, TMEM127, TP53, TSC1, TSC2, VHL, and WT1 (sequencing and deletion/duplication); EGFR, HOXB13, KIT, MITF, PDGFRA, POLD1, and POLE (sequencing only); EPCAM and GREM1 (deletion/duplication only).

## 2024-03-06 NOTE — Progress Notes (Signed)
 Patient seen by Dr. Thornton Papas today  Vitals are within treatment parameters:Yes   Labs are within treatment parameters: Yes   Treatment plan has been signed: Yes   Per physician team, Patient is ready for treatment and there are NO modifications to the treatment plan.

## 2024-03-07 ENCOUNTER — Other Ambulatory Visit: Payer: Self-pay

## 2024-03-07 ENCOUNTER — Telehealth: Payer: Self-pay | Admitting: Oncology

## 2024-03-07 ENCOUNTER — Inpatient Hospital Stay

## 2024-03-07 ENCOUNTER — Other Ambulatory Visit (HOSPITAL_BASED_OUTPATIENT_CLINIC_OR_DEPARTMENT_OTHER): Payer: Self-pay

## 2024-03-07 DIAGNOSIS — K56609 Unspecified intestinal obstruction, unspecified as to partial versus complete obstruction: Secondary | ICD-10-CM

## 2024-03-07 DIAGNOSIS — R180 Malignant ascites: Secondary | ICD-10-CM | POA: Diagnosis not present

## 2024-03-07 DIAGNOSIS — I2699 Other pulmonary embolism without acute cor pulmonale: Secondary | ICD-10-CM | POA: Diagnosis not present

## 2024-03-07 DIAGNOSIS — Z5111 Encounter for antineoplastic chemotherapy: Secondary | ICD-10-CM | POA: Diagnosis not present

## 2024-03-07 DIAGNOSIS — D630 Anemia in neoplastic disease: Secondary | ICD-10-CM | POA: Diagnosis not present

## 2024-03-07 DIAGNOSIS — I10 Essential (primary) hypertension: Secondary | ICD-10-CM | POA: Diagnosis not present

## 2024-03-07 DIAGNOSIS — C786 Secondary malignant neoplasm of retroperitoneum and peritoneum: Secondary | ICD-10-CM

## 2024-03-07 DIAGNOSIS — I82402 Acute embolism and thrombosis of unspecified deep veins of left lower extremity: Secondary | ICD-10-CM | POA: Diagnosis not present

## 2024-03-07 DIAGNOSIS — K5669 Other partial intestinal obstruction: Secondary | ICD-10-CM | POA: Diagnosis not present

## 2024-03-07 DIAGNOSIS — N179 Acute kidney failure, unspecified: Secondary | ICD-10-CM | POA: Diagnosis not present

## 2024-03-07 DIAGNOSIS — C541 Malignant neoplasm of endometrium: Secondary | ICD-10-CM | POA: Diagnosis not present

## 2024-03-07 DIAGNOSIS — C801 Malignant (primary) neoplasm, unspecified: Secondary | ICD-10-CM | POA: Diagnosis not present

## 2024-03-07 DIAGNOSIS — R971 Elevated cancer antigen 125 [CA 125]: Secondary | ICD-10-CM | POA: Diagnosis not present

## 2024-03-07 DIAGNOSIS — C7960 Secondary malignant neoplasm of unspecified ovary: Secondary | ICD-10-CM | POA: Diagnosis not present

## 2024-03-07 DIAGNOSIS — C787 Secondary malignant neoplasm of liver and intrahepatic bile duct: Secondary | ICD-10-CM | POA: Diagnosis not present

## 2024-03-07 LAB — CA 125: Cancer Antigen (CA) 125: 374 U/mL — ABNORMAL HIGH (ref 0.0–38.1)

## 2024-03-07 MED ORDER — OCTREOTIDE ACETATE 30 MG IM KIT
30.0000 mg | PACK | Freq: Once | INTRAMUSCULAR | Status: AC
  Administered 2024-03-07: 30 mg via INTRAMUSCULAR

## 2024-03-07 MED ORDER — PEGFILGRASTIM-CBQV 6 MG/0.6ML ~~LOC~~ SOSY
6.0000 mg | PREFILLED_SYRINGE | Freq: Once | SUBCUTANEOUS | Status: AC
  Administered 2024-03-07: 6 mg via SUBCUTANEOUS

## 2024-03-07 NOTE — Patient Instructions (Signed)
 Pegfilgrastim Injection What is this medication? PEGFILGRASTIM (PEG fil gra stim) lowers the risk of infection in people who are receiving chemotherapy. It works by Systems analyst make more white blood cells, which protects your body from infection. It may also be used to help people who have been exposed to high doses of radiation. This medicine may be used for other purposes; ask your health care provider or pharmacist if you have questions. COMMON BRAND NAME(S): Cherly Hensen, Neulasta, Nyvepria, Stimufend, UDENYCA, UDENYCA ONBODY, Ziextenzo What should I tell my care team before I take this medication? They need to know if you have any of these conditions: Kidney disease Latex allergy Ongoing radiation therapy Sickle cell disease Skin reactions to acrylic adhesives (On-Body Injector only) An unusual or allergic reaction to pegfilgrastim, filgrastim, other medications, foods, dyes, or preservatives Pregnant or trying to get pregnant Breast-feeding How should I use this medication? This medication is for injection under the skin. If you get this medication at home, you will be taught how to prepare and give the pre-filled syringe or how to use the On-body Injector. Refer to the patient Instructions for Use for detailed instructions. Use exactly as directed. Tell your care team immediately if you suspect that the On-body Injector may not have performed as intended or if you suspect the use of the On-body Injector resulted in a missed or partial dose. It is important that you put your used needles and syringes in a special sharps container. Do not put them in a trash can. If you do not have a sharps container, call your pharmacist or care team to get one. Talk to your care team about the use of this medication in children. While this medication may be prescribed for selected conditions, precautions do apply. Overdosage: If you think you have taken too much of this medicine contact a poison  control center or emergency room at once. NOTE: This medicine is only for you. Do not share this medicine with others. What if I miss a dose? It is important not to miss your dose. Call your care team if you miss your dose. If you miss a dose due to an On-body Injector failure or leakage, a new dose should be administered as soon as possible using a single prefilled syringe for manual use. What may interact with this medication? Interactions have not been studied. This list may not describe all possible interactions. Give your health care provider a list of all the medicines, herbs, non-prescription drugs, or dietary supplements you use. Also tell them if you smoke, drink alcohol, or use illegal drugs. Some items may interact with your medicine. What should I watch for while using this medication? Your condition will be monitored carefully while you are receiving this medication. You may need blood work done while you are taking this medication. Talk to your care team about your risk of cancer. You may be more at risk for certain types of cancer if you take this medication. If you are going to need a MRI, CT scan, or other procedure, tell your care team that you are using this medication (On-Body Injector only). What side effects may I notice from receiving this medication? Side effects that you should report to your care team as soon as possible: Allergic reactions--skin rash, itching, hives, swelling of the face, lips, tongue, or throat Capillary leak syndrome--stomach or muscle pain, unusual weakness or fatigue, feeling faint or lightheaded, decrease in the amount of urine, swelling of the ankles, hands, or  feet, trouble breathing High white blood cell level--fever, fatigue, trouble breathing, night sweats, change in vision, weight loss Inflammation of the aorta--fever, fatigue, back, chest, or stomach pain, severe headache Kidney injury (glomerulonephritis)--decrease in the amount of urine, red  or dark brown urine, foamy or bubbly urine, swelling of the ankles, hands, or feet Shortness of breath or trouble breathing Spleen injury--pain in upper left stomach or shoulder Unusual bruising or bleeding Side effects that usually do not require medical attention (report to your care team if they continue or are bothersome): Bone pain Pain in the hands or feet This list may not describe all possible side effects. Call your doctor for medical advice about side effects. You may report side effects to FDA at 1-800-FDA-1088. Where should I keep my medication? Keep out of the reach of children. If you are using this medication at home, you will be instructed on how to store it. Throw away any unused medication after the expiration date on the label. NOTE: This sheet is a summary. It may not cover all possible information. If you have questions about this medicine, talk to your doctor, pharmacist, or health care provider.  2024 Elsevier/Gold Standard (2021-11-14 00:00:00)  Octreotide Injection Solution What is this medication? OCTREOTIDE (ok TREE oh tide) treats high levels of growth hormone (acromegaly). It works by reducing the amount of growth hormone your body makes. This reduces symptoms and the risk of health problems caused by too much growth hormone, such as diabetes and heart disease. It may also be used to treat diarrhea caused by neuroendocrine tumors. It works by slowing down the release of serotonin from the tumor cells. This reduces the number of bowel movements you have. This medicine may be used for other purposes; ask your health care provider or pharmacist if you have questions. COMMON BRAND NAME(S): Berline Lopes, Sandostatin What should I tell my care team before I take this medication? They need to know if you have any of these conditions: Diabetes Gallbladder disease Heart disease Kidney disease Liver disease Pancreatic disease Thyroid disease An unusual or allergic reaction  to octreotide, other medications, foods, dyes, or preservatives Pregnant or trying to get pregnant Breastfeeding How should I use this medication? This medication is injected under the skin or into a vein. It is usually given by your care team in a hospital or clinic setting. If you get this medication at home, you will be taught how to prepare and give it. Use exactly as directed. Take it as directed on the prescription label at the same time every day. Keep taking it unless your care team tells you to stop. Allow the injection solution to come to room temperature before use. Do not warm it artificially. It is important that you put your used needles and syringes in a special sharps container. Do not put them in a trash can. If you do not have a sharps container, call your pharmacist or care team to get one. Talk to your care team about the use of this medication in children. Special care may be needed. Overdosage: If you think you have taken too much of this medicine contact a poison control center or emergency room at once. NOTE: This medicine is only for you. Do not share this medicine with others. What if I miss a dose? If you miss a dose, take it as soon as you can. If it is almost time for your next dose, take only that dose. Do not take double or extra doses. What  may interact with this medication? Bromocriptine Certain medications for blood pressure, heart disease, irregular heartbeat Cyclosporine Diuretics Medications for diabetes, including insulin Quinidine This list may not describe all possible interactions. Give your health care provider a list of all the medicines, herbs, non-prescription drugs, or dietary supplements you use. Also tell them if you smoke, drink alcohol, or use illegal drugs. Some items may interact with your medicine. What should I watch for while using this medication? Visit your care team for regular checks on your progress. Tell your care team if your  symptoms do not start to get better or if they get worse. To help reduce irritation at the injection site, use a different site for each injection and make sure the solution is at room temperature before use. This medication may cause decreases in blood sugar. Signs of low blood sugar include chills, cool, pale skin or cold sweats, drowsiness, extreme hunger, fast heartbeat, headache, nausea, nervousness or anxiety, shakiness, trembling, unsteadiness, tiredness, or weakness. Contact your care team right away if you experience any of these symptoms. This medication may increase blood sugar. The risk may be higher in patients who already have diabetes. Ask your care team what you can do to lower your risk of diabetes while taking this medication. You should make sure you get enough vitamin B12 while you are taking this medication. Discuss the foods you eat and the vitamins you take with your care team. What side effects may I notice from receiving this medication? Side effects that you should report to your care team as soon as possible: Allergic reactions--skin rash, itching, hives, swelling of the face, lips, tongue, or throat Gallbladder problems--severe stomach pain, nausea, vomiting, fever Heart rhythm changes--fast or irregular heartbeat, dizziness, feeling faint or lightheaded, chest pain, trouble breathing High blood sugar (hyperglycemia)--increased thirst or amount of urine, unusual weakness or fatigue, blurry vision Low blood sugar (hypoglycemia)--tremors or shaking, anxiety, sweating, cold or clammy skin, confusion, dizziness, rapid heartbeat Low thyroid levels (hypothyroidism)--unusual weakness or fatigue, increased sensitivity to cold, constipation, hair loss, dry skin, weight gain, feelings of depression Low vitamin B12 level--pain, tingling, or numbness in the hands or feet, muscle weakness, dizziness, confusion, trouble concentrating Oily or light-colored stools, diarrhea, bloating,  weight loss Pancreatitis--severe stomach pain that spreads to your back or gets worse after eating or when touched, fever, nausea, vomiting Slow heartbeat--dizziness, feeling faint or lightheaded, confusion, trouble breathing, unusual weakness or fatigue Side effects that usually do not require medical attention (report these to your care team if they continue or are bothersome): Diarrhea Dizziness Headache Nausea Pain, redness, or irritation at injection site Stomach pain This list may not describe all possible side effects. Call your doctor for medical advice about side effects. You may report side effects to FDA at 1-800-FDA-1088. Where should I keep my medication? Keep out of the reach of children and pets. Store in the refrigerator. Protect from light. Allow to come to room temperature naturally. Do not use artificial heat. If protected from light, the injection may be stored between 20 and 30 degrees C (70 and 86 degrees F) for 14 days. After the initial use, throw away any unused portion of a multiple dose vial after 14 days. Get rid of any unused portions of the ampules after use. To get rid of medications that are no longer needed or have expired: Take the medication to a medication take-back program. Ask your pharmacy or law enforcement to find a location. If you cannot return the medication,  ask your pharmacist or care team how to get rid of the medication safely. NOTE: This sheet is a summary. It may not cover all possible information. If you have questions about this medicine, talk to your doctor, pharmacist, or health care provider.  2024 Elsevier/Gold Standard (2023-11-26 00:00:00)

## 2024-03-07 NOTE — Telephone Encounter (Signed)
 Spoke with patient confirming upcoming appointment

## 2024-03-08 ENCOUNTER — Other Ambulatory Visit (HOSPITAL_BASED_OUTPATIENT_CLINIC_OR_DEPARTMENT_OTHER): Payer: Self-pay

## 2024-03-08 ENCOUNTER — Other Ambulatory Visit: Payer: Self-pay

## 2024-03-08 DIAGNOSIS — K56609 Unspecified intestinal obstruction, unspecified as to partial versus complete obstruction: Secondary | ICD-10-CM | POA: Diagnosis not present

## 2024-03-08 DIAGNOSIS — I2699 Other pulmonary embolism without acute cor pulmonale: Secondary | ICD-10-CM | POA: Diagnosis not present

## 2024-03-08 DIAGNOSIS — C569 Malignant neoplasm of unspecified ovary: Secondary | ICD-10-CM | POA: Diagnosis not present

## 2024-03-08 DIAGNOSIS — C786 Secondary malignant neoplasm of retroperitoneum and peritoneum: Secondary | ICD-10-CM | POA: Diagnosis not present

## 2024-03-08 DIAGNOSIS — E43 Unspecified severe protein-calorie malnutrition: Secondary | ICD-10-CM | POA: Diagnosis not present

## 2024-03-08 DIAGNOSIS — I10 Essential (primary) hypertension: Secondary | ICD-10-CM | POA: Diagnosis not present

## 2024-03-10 DIAGNOSIS — D630 Anemia in neoplastic disease: Secondary | ICD-10-CM | POA: Diagnosis not present

## 2024-03-10 DIAGNOSIS — I82402 Acute embolism and thrombosis of unspecified deep veins of left lower extremity: Secondary | ICD-10-CM | POA: Diagnosis not present

## 2024-03-10 DIAGNOSIS — C7960 Secondary malignant neoplasm of unspecified ovary: Secondary | ICD-10-CM | POA: Diagnosis not present

## 2024-03-10 DIAGNOSIS — C786 Secondary malignant neoplasm of retroperitoneum and peritoneum: Secondary | ICD-10-CM | POA: Diagnosis not present

## 2024-03-10 DIAGNOSIS — I2699 Other pulmonary embolism without acute cor pulmonale: Secondary | ICD-10-CM | POA: Diagnosis not present

## 2024-03-10 DIAGNOSIS — N179 Acute kidney failure, unspecified: Secondary | ICD-10-CM | POA: Diagnosis not present

## 2024-03-10 DIAGNOSIS — I10 Essential (primary) hypertension: Secondary | ICD-10-CM | POA: Diagnosis not present

## 2024-03-10 DIAGNOSIS — R180 Malignant ascites: Secondary | ICD-10-CM | POA: Diagnosis not present

## 2024-03-10 DIAGNOSIS — C541 Malignant neoplasm of endometrium: Secondary | ICD-10-CM | POA: Diagnosis not present

## 2024-03-12 DIAGNOSIS — C569 Malignant neoplasm of unspecified ovary: Secondary | ICD-10-CM | POA: Diagnosis not present

## 2024-03-12 DIAGNOSIS — C786 Secondary malignant neoplasm of retroperitoneum and peritoneum: Secondary | ICD-10-CM | POA: Diagnosis not present

## 2024-03-13 ENCOUNTER — Inpatient Hospital Stay

## 2024-03-13 DIAGNOSIS — C801 Malignant (primary) neoplasm, unspecified: Secondary | ICD-10-CM | POA: Diagnosis not present

## 2024-03-13 DIAGNOSIS — R971 Elevated cancer antigen 125 [CA 125]: Secondary | ICD-10-CM | POA: Diagnosis not present

## 2024-03-13 DIAGNOSIS — C786 Secondary malignant neoplasm of retroperitoneum and peritoneum: Secondary | ICD-10-CM

## 2024-03-13 DIAGNOSIS — K5669 Other partial intestinal obstruction: Secondary | ICD-10-CM | POA: Diagnosis not present

## 2024-03-13 DIAGNOSIS — I2699 Other pulmonary embolism without acute cor pulmonale: Secondary | ICD-10-CM | POA: Diagnosis not present

## 2024-03-13 DIAGNOSIS — Z452 Encounter for adjustment and management of vascular access device: Secondary | ICD-10-CM

## 2024-03-13 DIAGNOSIS — Z5111 Encounter for antineoplastic chemotherapy: Secondary | ICD-10-CM | POA: Diagnosis not present

## 2024-03-13 DIAGNOSIS — C787 Secondary malignant neoplasm of liver and intrahepatic bile duct: Secondary | ICD-10-CM | POA: Diagnosis not present

## 2024-03-13 LAB — CBC WITH DIFFERENTIAL (CANCER CENTER ONLY)
Abs Immature Granulocytes: 0.32 10*3/uL — ABNORMAL HIGH (ref 0.00–0.07)
Basophils Absolute: 0.1 10*3/uL (ref 0.0–0.1)
Basophils Relative: 1 %
Eosinophils Absolute: 0.1 10*3/uL (ref 0.0–0.5)
Eosinophils Relative: 1 %
HCT: 28.5 % — ABNORMAL LOW (ref 36.0–46.0)
Hemoglobin: 9.5 g/dL — ABNORMAL LOW (ref 12.0–15.0)
Immature Granulocytes: 3 %
Lymphocytes Relative: 21 %
Lymphs Abs: 2.1 10*3/uL (ref 0.7–4.0)
MCH: 31 pg (ref 26.0–34.0)
MCHC: 33.3 g/dL (ref 30.0–36.0)
MCV: 93.1 fL (ref 80.0–100.0)
Monocytes Absolute: 1.2 10*3/uL — ABNORMAL HIGH (ref 0.1–1.0)
Monocytes Relative: 12 %
Neutro Abs: 6.3 10*3/uL (ref 1.7–7.7)
Neutrophils Relative %: 62 %
Platelet Count: 157 10*3/uL (ref 150–400)
RBC: 3.06 MIL/uL — ABNORMAL LOW (ref 3.87–5.11)
RDW: 15.2 % (ref 11.5–15.5)
WBC Count: 10 10*3/uL (ref 4.0–10.5)
nRBC: 0 % (ref 0.0–0.2)

## 2024-03-13 MED ORDER — SODIUM CHLORIDE 0.9% FLUSH: 10.0000 mL | INTRAVENOUS | Status: DC | PRN

## 2024-03-13 MED ORDER — HEPARIN SOD (PORK) LOCK FLUSH 100 UNIT/ML IV SOLN
500.0000 [IU] | Freq: Once | INTRAVENOUS | Status: AC
  Administered 2024-03-13: 500 [IU] via INTRAVENOUS

## 2024-03-13 NOTE — Progress Notes (Signed)
 Palliative Medicine [facility]  [phone] [phone]   Name: Tracy Preston Date: 03/13/2024 MRN: [id number]  DOB: [birth year]  Patient Care Team: Berta Brittle, MD (Inactive) as PCP - General (Internal Medicine)    REASON FOR CONSULTATION: Tracy Preston is a 89 y.o. 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 Palliative ask to see for symptom management and goals of care.    SOCIAL HISTORY:     reports that she has never smoked. She does not have any smokeless tobacco history on file. She reports current alcohol use. She reports that she does not use drugs.  ADVANCE DIRECTIVES:  Advanced directive, DNR,and MOST forms on file.   CODE STATUS: DNR  PAST MEDICAL HISTORY: Past Medical History:  Diagnosis Date   Cancer (HCC)    skin cancer leg -no problems now   GERD (gastroesophageal reflux disease)    mild -no meds   Heart murmur    faint   Hypertension     PAST SURGICAL HISTORY:  Past Surgical History:  Procedure Laterality Date   BREAST BIOPSY  05/28/1984   benign-unknown which breast   COLONOSCOPY W/ POLYPECTOMY     COLONOSCOPY WITH PROPOFOL  N/A 08/06/2015   Procedure: COLONOSCOPY WITH PROPOFOL ;  Surgeon: Garrett Kallman, MD;  Location: [facility];  Service: Endoscopy;  Laterality: N/A;   TONSILLECTOMY     child   TUBAL LIGATION      HEMATOLOGY/ONCOLOGY HISTORY:  Oncology History  Peritoneal carcinomatosis (HCC)  02/04/2024 Initial Diagnosis   Peritoneal carcinomatosis (HCC)   02/11/2024 -  Chemotherapy   Patient is on Treatment Plan : UTERINE Carboplatin  + Paclitaxel  (5/135) q21d       ALLERGIES:  is allergic to calcium-containing compounds.  MEDICATIONS:  Current Outpatient Medications  Medication Sig Dispense Refill   APIXABAN  (ELIQUIS ) VTE STARTER PACK (10MG  AND 5MG ) Take as directed on package: Start with  two 5mg  tablets twice daily for 7 days then on day 8, switch to one 5mg  tablet twice daily. 74 each 0   calcium-vitamin D (OSCAL WITH D) 500-200 MG-UNIT per tablet Take 1 tablet by mouth every 7 (seven) days.     Cholecalciferol (VITAMIN D3) 1000 units CAPS Take 1,000 Units by mouth daily with breakfast.     clotrimazole  (ANTI-FUNGAL) 1 % cream Apply 1 Application topically daily as needed (for any yeast issues- affected areas).     [Paused] losartan  (COZAAR ) 100 MG tablet Take 100 mg by mouth every morning. (Patient not taking: Reported on 03/06/2024)     nystatin  (MYCOSTATIN ) 100000 UNIT/ML suspension Take 5 mLs (500,000 Units total) by mouth 4 (four) times daily. 60 mL 0   Octreotide  Acetate 200 MCG/ML SOLN Inject 0.5 mLs (100 mcg total) as directed 2 (two) times daily. 10 mL 0   ondansetron  (ZOFRAN -ODT) 4 MG disintegrating tablet Take 1 tablet (4 mg total) by mouth every 8 (eight) hours as needed for nausea or vomiting. 20 tablet 0   pantoprazole  (PROTONIX ) 40 MG tablet Take 1 tablet (40 mg total) by mouth daily. 30 tablet 0   prochlorperazine  (COMPAZINE ) 5 MG tablet Take 1-2 tablets (5-10 mg total) by mouth every 6 (six) hours as needed for nausea or vomiting. 60 tablet 1   No current facility-administered medications for this visit.    VITAL SIGNS: There were no vitals taken for this visit. There were no vitals filed for this visit.  Estimated body  mass index is 20.87 kg/m as calculated from the following:   Height as of 03/06/24: 5' 4 (1.626 m).   Weight as of 03/06/24: 121 lb 9.6 oz (55.2 kg).  LABS: CBC:    Component Value Date/Time   WBC 10.8 (H) 03/06/2024 0815   WBC 7.3 02/28/2024 0750   HGB 10.4 (L) 03/06/2024 0815   HCT 31.2 (L) 03/06/2024 0815   PLT 207 03/06/2024 0815   MCV 94.3 03/06/2024 0815   NEUTROABS 5.4 03/06/2024 0815   LYMPHSABS 4.0 03/06/2024 0815   MONOABS 1.1 (H) 03/06/2024 0815   EOSABS 0.1 03/06/2024 0815   BASOSABS 0.0 03/06/2024 0815   Comprehensive  Metabolic Panel:    Component Value Date/Time   NA 138 03/06/2024 0815   K 4.1 03/06/2024 0815   CL 101 03/06/2024 0815   CO2 29 03/06/2024 0815   BUN 43 (H) 03/06/2024 0815   CREATININE 0.87 03/06/2024 0815   GLUCOSE 93 03/06/2024 0815   CALCIUM 8.6 (L) 03/06/2024 0815   AST 16 03/06/2024 0815   ALT 19 03/06/2024 0815   ALKPHOS 133 (H) 03/06/2024 0815   BILITOT 0.4 03/06/2024 0815   PROT 6.5 03/06/2024 0815   ALBUMIN 3.7 03/06/2024 0815    RADIOGRAPHIC STUDIES: CT ABDOMEN PELVIS W CONTRAST Result Date: 02/22/2024 CLINICAL DATA:  Ovarian cancer, bowel obstruction, restaging. * Tracking Code: BO * EXAM: CT ABDOMEN AND PELVIS WITH CONTRAST TECHNIQUE: Multidetector CT imaging of the abdomen and pelvis was performed using the standard protocol following bolus administration of intravenous contrast. RADIATION DOSE REDUCTION: This exam was performed according to the departmental dose-optimization program which includes automated exposure control, adjustment of the mA and/or kV according to patient size and/or use of iterative reconstruction technique. CONTRAST:  OMNIPAQUE  IOHEXOL  300 MG/ML  SOLN COMPARISON:  Abdominopelvic CT 02/04/2024, pelvic MRI 02/11/2024 and chest CT 02/11/2024. FINDINGS: Lower chest: Small right-greater-than-left pleural effusions with dependent atelectasis at both lung bases, new from 02/04/2024 and increased from 02/11/2024. Contrast filled and mildly distended distal esophagus. Hepatobiliary: Multiple hypoenhancing hepatic metastases are again noted. The largest lesion superior to the gallbladder is less well-defined on the current study and may be slightly smaller, measuring 1.4 cm on image 26/2 (previously 1.9 x 1.5 cm). Other lesions are grossly stable, including a lesion more superiorly in the central right lobe measuring 1.4 cm on image 18/2. Small hepatic cysts are also noted. No evidence of gallstones, gallbladder wall thickening or biliary dilatation. Pancreas:  Unremarkable. No pancreatic ductal dilatation or surrounding inflammatory changes. Spleen: Stable 8 mm low-density splenic lesion on image 24/2, likely a cyst. The spleen is normal in size without other focal abnormality. Adrenals/Urinary Tract: Both adrenal glands appear normal. No evidence of urinary tract calculus, suspicious renal lesion or hydronephrosis. The right renal pelvis is mildly dilated without secondary signs of ureteral obstruction. There are renal sinus cysts bilaterally. The bladder appears unremarkable for its degree of distention. Stomach/Bowel: Enteric contrast has passed into the mid colon. The stomach remains mildly distended. Small bowel distension has mildly improved. The colon remains decompressed. There is a persistent distal colonic intussusception with the sigmoid colon invaginated into the rectum. Appearance is similar to the previous CT from 02/04/2024. No evidence of bowel perforation. Vascular/Lymphatic: Small retroperitoneal and mesenteric lymph nodes appear unchanged. Mild aortic and branch vessel atherosclerosis without evidence of aneurysm or large vessel occlusion. The portal, superior mesenteric and splenic veins remain patent. Reproductive: Persistent ill-defined mass involving the cervix, measuring approximately 3.4 x 4.0 cm  on image 62/2. There is mild distension of the endometrial cavity. A small peripherally calcified uterine fundal fibroid is noted. Persistent ill-defined adnexal masses bilaterally, measuring approximately 4.0 x 2.6 cm on the right (image 54/2) and 2.9 x 1.9 cm on the left (image 57/2). Other: Small to moderate volume of ascites, similar to previous CT. Diffuse omental nodularity may be minimally improved in the interval with a dominant component in the right mid abdomen measuring 3.8 x 2.3 cm on image 39/2. No extravasated enteric contrast or pneumoperitoneum. Musculoskeletal: No acute or significant osseous findings. Mild lumbar spondylosis. IMPRESSION:  1. Persistent distal colonic intussusception with the sigmoid colon invaginated into the rectum. Appearance is similar to the previous CT from 02/04/2024 and may relate to underlying peritoneal tumor. 2. Mild improvement in distal partial small bowel obstruction with enteric contrast passing into the mid colon. The colon remains decompressed. 3. Persistent ill-defined cervical and bilateral adnexal masses consistent with known ovarian cancer. Associated peritoneal carcinomatosis may be minimally improved. No definite progressive disease. 4. Persistent hepatic metastases, one of which may be slightly smaller in the interval. 5. Small right-greater-than-left pleural effusions with dependent atelectasis at both lung bases, new from 02/04/2024 and increased from 02/11/2024. 6.  Aortic Atherosclerosis (ICD10-I70.0). Electronically Signed   By: Elmon Hagedorn M.D.   On: 02/22/2024 16:08    PERFORMANCE STATUS (ECOG) : 1 - Symptomatic but completely ambulatory  Review of Systems  Constitutional:  Positive for activity change, appetite change and fatigue.  Gastrointestinal:  Positive for diarrhea.  Unless otherwise noted, a complete review of systems is negative.  Physical Exam General: NAD HEENT: oral thrush Cardiovascular: regular rate and rhythm Pulmonary: clear ant fields Abdomen: soft, nontender, + bowel sounds Extremities: no edema, no joint deformities Skin: no rashes, hemorrhoids  Neurological: Alert and oriented x3  IMPRESSION: Tracy Preston is a 89 year old female with recently diagnoses metastatic carcinoma (GYN primary) who presents for follow-up after hospitalization. This is her initial outpatient palliative visit. She was initially seen during recent hospitalization by our team. She is accompanied by her daughter who is visiting from [place] and son, Tracy Preston. Patient is ambulatory without assistance. Alert and able to engage appropriately in discussions.   I introduced Tracy Pine, NP,  Tracy Preston, and Palliative's role in collaboration with the oncology team. Concept of Palliative Care was introduced as specialized medical care for people and their families living with serious illness.  It focuses on providing relief from the symptoms and stress of a serious illness.  The goal is to improve quality of life for both the patient and the family. Values and goals of care important to patient and family were attempted to be elicited.   She reports a reasonable energy level, managing daily activities with some assistance. She can perform small tasks around the house and takes short walks with the aid of a wheelchair. However, she notes a significant decline in her physical abilities since her diagnosis, which was rapid and life-changing.  She has a PICC line in place, which she is careful to protect during showers. There was a recent incident where the netting got wet, but it was addressed promptly. She is vigilant about preventing infection, especially given her weakened immune system due to chemotherapy. Additional netting protection provided for home use. Site is intact with no abnormalities on exam.   Regarding her diet, she consumes protein drinks, soups, milkshakes, smoothies, and custards. Recently, she has started to introduce more solid foods like toast and crackers. She  wants to expand her diet to include more solid foods such as chicken, fish, and cooked vegetables. She has lost six pounds recently, which is concerning to her. She attributes this to staying on a liquid diet and being cautious about eating more due to fear of bowel obstruction. She acknowledges some post-traumatic stress related to her previous bowel obstruction and hospitalization. Education provided on re-introducing more solid foods with understanding to avoid acidic, high fibrous, and dense items. Advised to slowly re-introduced certain foods to allow her the ability to pay attention to her body recognizing what is  and isn't tolerated. She is aware to stay away from fresh fruits and vegetables ensuring if she does eat, they are cooked with a soft consistency. Her daughter is planning to make her a homemade soup.   Tracy Preston is currently undergoing chemotherapy, which has been challenging, particularly affecting her gastrointestinal tract. She experiences diarrhea, which has led to hemorrhoids and some skin irritation, managed with hemorrhoid cream. The diarrhea was worse two days ago but has improved. She also has a dry mouth, managed with Biotene, and previously had white patches in her mouth treated with nystatin  mouthwash. She is concerned about the potential recurrence of thrush. We will send in additional prescription for Nystatin  for support. Education provided on bowel health included use of wet wipes, Immodium, and Proctofoam for hemorrhoids.   Goals of Care We discussed her current illness and what it means in the larger context of her on-going co-morbidities. Natural disease trajectory and expectations were discussed.  Extensive goals of care discussions including disease trajectory. Patient and family encouraged as she continues her health journey and make complex decisions that Tracy Preston's quality of life is at the center allowing appropriate navigation of decisions. She is clear in her expressed wishes that her quality of life is most important to her versus quantity. She and family are realistic in their understanding expressing awareness that her treatment is palliative focus for her incurable cancer. The goal is to continue to treat to treat the treatable allowing her every opportunity to continue thriving while managing her symptoms. She wishes to continue chemotherapy for now however if her quality of life continues to show signs of decline her desire is to spend what time she has left in with the best quality possible understanding this may require discontinuation of treatments that are not symptom  and comfort focused. Patient and family would like to continue with palliative's support and also requesting to have family discussions in the near future.   All questions answered and support provided.  I discussed the importance of continued conversation with family and their medical providers regarding overall plan of care and treatment options, ensuring decisions are within the context of the patients values and GOCs.  PLAN: Established therapeutic relationship. Education provided on palliative's role in collaboration with their Oncology/Radiation team.  Chemotherapy-induced diarrhea Diarrhea from chemotherapy causing hemorrhoids and urgency. Slight improvement noted. Effective management crucial for quality of life. - Advise Imodium for more than three loose stools daily or if diarrhea impacts quality of life. -Proctofoam as needed for hemorrhoids   Oral thrush Oral thrush treated with nystatin  improved. High risk for recurrence due to chemotherapy. - Prescribe nystatin  for preventative use, swab in mouth twice a day.  Nutritional management post-TPN Off TPN, managing nutrition with oral intake. Advised to introduce more solid foods while avoiding fibrous and hard-to-digest foods. Discussed potential benefits of low-dose daily steroids for appetite and energy. Weight has decreased to  117lbs from 122lbs. We will continue to closely monitor. She is actively following up with Dietician.  - Encourage introduction of soft, easily digestible solid foods such as scrambled eggs, chicken, fish, and cooked vegetables. - Avoid raw vegetables, dense meats, and fibrous foods. - Monitor weight and nutritional intake closely. - Consider reintroducing low-dose daily steroids to stimulate appetite and improve energy.  PICC line management PICC line in place, managed to prevent infection. Dressing remains intact despite concern about netting getting wet. - Continue weekly dressing changes and flushing  the PICC line per oncology.  - Ensure proper care during showers and baths to prevent infection.  Goals of Care Discussed cumulative side effects of chemotherapy and impact on quality of life. Considering stopping chemotherapy if side effects outweigh benefits. Discussed symptom management with steroids and octreotide . Patient and family are realistic in their understanding that her chemotherapy is palliative care focus. - Schedule a family meeting to discuss treatment options and goals of care. - Consider stopping chemotherapy if side effects outweigh benefits. She is clear her quality of life is most important and spending what time she has left in the best possible state of health while managing symptoms.  - Discuss potential for hospice care and palliative management if chemotherapy is discontinued.  Patient expressed understanding and was in agreement with this plan. She also understands that She can call the clinic at any time with any questions, concerns, or complaints.   Thank you for your referral and allowing Palliative to assist in Tracy Preston's care.   Number and complexity of problems addressed: HIGH - 1 or more chronic illnesses with SEVERE exacerbation, progression, or side effects of treatment - advanced cancer, pain. Any controlled substances utilized were prescribed in the context of palliative care.   Visit consisted of counseling and education dealing with the complex and emotionally intense issues of symptom management and palliative care in the setting of serious and potentially life-threatening illness.  Signed by: Dellia Ferguson, AGPCNP-BC Flora Humphreys, DO  Palliative Medicine Team/[facility]

## 2024-03-13 NOTE — Patient Instructions (Signed)
 Heparin Injection What is this medication? HEPARIN INJECTION (HEP a rin) prevents and treats blood clots. It belongs to a group of medications called blood thinners. This medicine may be used for other purposes; ask your health care provider or pharmacist if you have questions. COMMON BRAND NAME(S): Hep-Lock, Hep-Lock U/P, Hepflush-10, Monoject Prefill Advanced Heparin Lock Flush, SASH Normal Saline and Heparin What should I tell my care team before I take this medication? They need to know if you have any of these conditions: Bleeding disorders, such as hemophilia or low blood platelets Bowel disease or diverticulitis Endocarditis High blood pressure Liver disease Recent surgery Stomach ulcers An unusual or allergic reaction to heparin, other medications, foods, dyes, or preservatives Pregnant, trying to get pregnant, or recently pregnant Breastfeeding How should I use this medication? This medication is injected into a vein or under the skin. It is usually given by your care team in a hospital or clinic setting. If you get this medication at home, you will be taught how to prepare and give it. For your therapy to work as well as possible, take each dose exactly as prescribed on the prescription label. Do not skip doses. Skipping doses or stopping this medication can increase your risk of a blood clot. Keep taking this medication unless your care team tells you to stop. It is important that you put your used needles and syringes in a special sharps container. Do not put them in a trash can. If you do not have a sharps container, call your pharmacist or care team to get one. Talk to your care team about the use of this medication in children. While this medication may be prescribed for children for selected conditions, precautions do apply. Overdosage: If you think you have taken too much of this medicine contact a poison control center or emergency room at once. NOTE: This medicine is only  for you. Do not share this medicine with others. What if I miss a dose? If you miss a dose, take it as soon as you can. If it is almost time for your next dose, take only that dose. Do not take double or extra doses. What may interact with this medication? Do not take this medication with any of the following: Aspirin and aspirin-like medications Mifepristone Medications that treat or prevent blood clots, such as warfarin, enoxaparin, dalteparin Palifermin Protamine This medication may also interact with the following: Dextran Digoxin Hydroxychloroquine Medications for treating colds or allergies Nicotine NSAIDs, medications for pain and inflammation, such as ibuprofen or naproxen Phenylbutazone Tetracycline antibiotics This list may not describe all possible interactions. Give your health care provider a list of all the medicines, herbs, non-prescription drugs, or dietary supplements you use. Also tell them if you smoke, drink alcohol, or use illegal drugs. Some items may interact with your medicine. What should I watch for while using this medication? Visit your care team for regular checks on your progress. You may need blood work while taking this medication. Your condition will be monitored carefully while you are receiving this medication. Wear a medical ID bracelet or chain, and carry a card that describes your disease and details of your medication and dosage times. Notify your care team at once if you have cold, blue hands or feet. If you are going to need surgery or other procedure, tell your care team that you are using this medication. Avoid sports and activities that might cause injury while you are using this medication. Severe falls or injuries can  cause unseen bleeding. Be careful when using sharp tools or knives. Consider using an Neurosurgeon. Take special care brushing or flossing your teeth. Report any injuries, bruising, or red spots on the skin to your care  team. Using this medication for a long time may weaken your bones. The risk of bone fractures may be increased. Talk to your care team about your bone health. You should make sure that you get enough calcium and vitamin D while you are taking this medication. Discuss the foods you eat and the vitamins you take with your care team. Wear a medical ID bracelet or chain. Carry a card that describes your condition. List the medications and doses you take on the card. What side effects may I notice from receiving this medication? Side effects that you should report to your care team as soon as possible: Allergic reactions--skin rash, itching, hives, swelling of the face, lips, tongue, or throat Bleeding--bloody or black, tar-like stools, vomiting blood or brown material that looks like coffee grounds, red or dark brown urine, small red or purple spots on skin, unusual bruising or bleeding Blood clot--pain, swelling, or warmth in the leg, shortness of breath, chest pain Side effects that usually do not require medical attention (report to your care team if they continue or are bothersome): Pain, redness, or irritation at injection site This list may not describe all possible side effects. Call your doctor for medical advice about side effects. You may report side effects to FDA at 1-800-FDA-1088. Where should I keep my medication? Keep out of the reach of children and pets. Store unopened vials at room temperature between 15 and 30 degrees C (59 and 86 degrees F). Do not freeze. Do not use if solution is discolored or particulate matter is present. To get rid of medications that are no longer needed or have expired: Take the medication to a medication take-back program. Check with your pharmacy or law enforcement to find a location. If you cannot return the medication, ask your pharmacist or care team how to get rid of this medication safely. NOTE: This sheet is a summary. It may not cover all possible  information. If you have questions about this medicine, talk to your doctor, pharmacist, or health care provider.  2024 Elsevier/Gold Standard (2022-06-04 00:00:00)Central Line in Adults: What to Expect A central line is a soft tube called a catheter that is put into a vein in the neck, chest, arm or groin. The tip of the central line ends in a large vein just above the heart called the vena cava. A central line may be used to: Give medicines or fluids through an IV for a long time. Give you nutrients. Take blood or give you blood for testing or treatments. Give chemotherapy or dialysis. Provide IV access if veins in your hands or arms are difficult to use. Types of central lines There are four main types of central lines: Peripherally inserted central catheter (PICC) line. A PICC line goes up the upper arm to the vena cava. This is used for access of one week or longer. It can be used to draw blood, give fluids or medicines. Tunneled central line. It's placed in a large vein in the neck, chest, or groin. It's put in through a small cut is made over the vein, and then it's advanced to the vena cava. This is used for long-term therapy and dialysis. It's tunneled under the skin and brought out through a second cut. Non-tunneled central line. This  type is put in the neck, chest or groin. It's usually used for 7 days at the most. It's often used in the emergency department. Implanted port. It's usually put in the upper chest, but it can also be placed in the upper arm or the belly. This is used for long-term therapy. It can stay in place longer than other types of central lines. It's put in and taken out with surgery, and it's accessed using a needle. The type of central line you get depends on how long you will need it, your medical condition, and the condition of your veins. Tell a health care provider about: Any allergies you have. All medicines you take. These include vitamins, herbs, eye  drops, and creams. Any problems you or family members have had with anesthesia. Any bleeding problems you have. Any surgeries you've had. Any medical conditions you have. Whether you're pregnant or may be pregnant. What are the risks? Your health care provider will talk with you about risks. These may include: Infection. A blood clot that blocks the central line or forms in the vein and travels to the heart. Bleeding. Getting a hole or crack in the central line. If this happens, the central line will need to be replaced. The catheter moving or coming out of place. A collapsed lung. Damage to other structures or organs. What happens before the procedure? Medicines Ask about changing or stopping: Any medicines you take. Any vitamins, herbs, or supplements you take. Do not take aspirin or ibuprofen unless you're told to. Surgery safety For your safety, you may: Need to wash your skin with a soap that kills germs. Get antibiotics. Have your procedure site marked. Have hair removed at the procedure site. General instructions Eat and drink as told. Ask if you'll be staying overnight in the hospital. If you'll be going home right after the procedure, plan to have a responsible adult: Drive you home from the hospital or clinic. You won't be allowed to drive. Stay with you for the time you're told. What happens during the procedure? An IV will be put into one of your veins. You may be given: A sedative to help you relax. Anesthesia to keep you from feeling pain. Your skin will be cleaned with a soap that kills germs, and you may be covered with a clean sheet called a drape. Your blood pressure, heart rate, breathing rate, and blood oxygen level will be checked during the procedure. The central line catheter will be put into the vein and moved through to the correct spot. The provider may use X-rays to make sure the catheter goes to the right place. A bandage will be placed over the  insertion area. These steps may vary. Ask what you can expect. What can I expect after the procedure? You will watched closely until you leave. This includes checking your pain level, blood pressure, heart rate, and breathing rate. Caps may be put on the ends of the central line tubes. If you were given a sedative, do not drive or use machines until you're told it's safe. A sedative can make you sleepy. Follow these instructions at home: Flushing and cleaning the central line  Follow instructions from your provider about flushing and cleaning the central line and the area around it. Only use germ-free, also called sterile, supplies to flush the central line. Use supplies recommended by your provider. Before you flush the central line or clean the central line or the area around it: Wash your hands with  soap and water for at least 20 seconds. If you can't use soap and water, use hand sanitizer. Clean the central line hub with rubbing alcohol. To do this: Clean the central line hub with a new alcohol wipe. Scrub it using a twisting motion and rub for 10 to 15 seconds or for 30 twists. Follow the manufacturer's instructions. Be sure you scrub the top of the hub, not just the sides. Let the hub dry before use. Prevent it from touching anything while drying. Do not re-use alcohol pads. Caring for your skin Check your central line site every day for signs of infection. Check for: Redness, swelling, or pain. Fluid or blood. Warmth. Pus or a bad smell. Keep the insertion site of your central line clean and dry at all times. Change your bandage only as told by your provider. Keep your bandage dry. If it gets wet, have it changed as soon as possible. Storage and throwing away supplies Keep your supplies in a clean, dry location. Throw away any used syringes in a disposal container that is meant for sharp items, called a sharps container. You can buy a sharps container from a pharmacy, or you can  make one by using an empty hard plastic bottle with a cover. Place any used bandages or infusion bags into a plastic bag. Throw that bag in the trash. General instructions Follow instructions from your provider for the type of device that you have. Keep the tube clamped unless it's being used. If you or someone else accidentally pulls on the tube, make sure: The bandage is OK. There is no bleeding. The tube has not been pulled out. Do not use scissors or sharp objects near the tube. Do not take baths, swim, or use a hot tub until you're told it's OK. Ask if you can shower. Ask what things are safe for you to do. Your provider may tell you not to lift anything or move your arm too much on the side of your central line. Contact a health care provider if: You have signs of an infection where the tube was put in. The skin is irritated near the bandage. You catheter appears to be getting longer. This may mean it's coming out of the vein. Get help right away if: You have: A fever or chills. Shortness of breath. Pain in your chest. A fast heartbeat. Swelling in your neck, face, chest, or arm on the side of your central line. You feel dizzy or you faint. Your cut or central line site has red streaks spreading away from the area. Your cut or central line site is bleeding and won't stop. Your central line is hard to flush or will not flush or you do not get a blood return. Your central line gets loose, damaged or comes out. Your catheter leaks when flushed or when fluids are infused into it. This information is not intended to replace advice given to you by your health care provider. Make sure you discuss any questions you have with your health care provider. Document Revised: 07/12/2023 Document Reviewed: 07/12/2023 Elsevier Patient Education  2024 [employer].

## 2024-03-14 ENCOUNTER — Encounter: Payer: Self-pay | Admitting: Genetic Counselor

## 2024-03-14 ENCOUNTER — Encounter: Payer: Self-pay | Admitting: Nurse Practitioner

## 2024-03-14 ENCOUNTER — Inpatient Hospital Stay (HOSPITAL_BASED_OUTPATIENT_CLINIC_OR_DEPARTMENT_OTHER): Payer: Medicare PPO | Admitting: Nurse Practitioner

## 2024-03-14 VITALS — BP 134/68 | HR 78 | Temp 98.0°F | Resp 17 | Wt 117.5 lb

## 2024-03-14 DIAGNOSIS — Z515 Encounter for palliative care: Secondary | ICD-10-CM

## 2024-03-14 DIAGNOSIS — G893 Neoplasm related pain (acute) (chronic): Secondary | ICD-10-CM | POA: Diagnosis not present

## 2024-03-14 DIAGNOSIS — C787 Secondary malignant neoplasm of liver and intrahepatic bile duct: Secondary | ICD-10-CM

## 2024-03-14 DIAGNOSIS — R530 Neoplastic (malignant) related fatigue: Secondary | ICD-10-CM | POA: Diagnosis not present

## 2024-03-14 DIAGNOSIS — Z7189 Other specified counseling: Secondary | ICD-10-CM | POA: Diagnosis not present

## 2024-03-14 DIAGNOSIS — K1230 Oral mucositis (ulcerative), unspecified: Secondary | ICD-10-CM | POA: Diagnosis not present

## 2024-03-14 DIAGNOSIS — K649 Unspecified hemorrhoids: Secondary | ICD-10-CM | POA: Diagnosis not present

## 2024-03-14 DIAGNOSIS — Z1379 Encounter for other screening for genetic and chromosomal anomalies: Secondary | ICD-10-CM | POA: Insufficient documentation

## 2024-03-14 DIAGNOSIS — R197 Diarrhea, unspecified: Secondary | ICD-10-CM

## 2024-03-14 MED ORDER — NYSTATIN 100000 UNIT/ML MT SUSP: 5.0000 mL | Freq: Two times a day (BID) | OROMUCOSAL | 3 refills | Status: DC

## 2024-03-14 MED ORDER — HYDROCORT-PRAMOXINE (PERIANAL) 1-1 % EX FOAM: 1.0000 | Freq: Two times a day (BID) | CUTANEOUS | 3 refills | Status: DC | PRN

## 2024-03-14 MED ORDER — DEXAMETHASONE 2 MG PO TABS: 2.0000 mg | ORAL_TABLET | Freq: Every day | ORAL | 0 refills | Status: DC

## 2024-03-15 ENCOUNTER — Inpatient Hospital Stay: Admitting: Nutrition

## 2024-03-15 NOTE — Progress Notes (Signed)
 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 She is a patient of Dr. Truett Perna receiving Carbo/Taxol.  PMH includes HTN and GERD.  Medications include Calcium with Vit D, D 3,Decadron, nystatin, octreotide, Zofran, Protonix, and Compazine.  Labs include BUN 43 on March 10.  Height: 5'4". Weight: 117 pounds 8 oz March 18. UBW: 121-122 pounds. BMI: 20.17.  Recent hospital admission for abdominal distention, vomiting for 2 weeks. She was diagnosed with peritoneal carcinomatosis and developed a bowel obstruction. She started on TPN. She tolerated a clear liquid diet, advancing to full liquids at D/C on March 5. She continued on home TPN until March 10 when it was discontinued. She was diagnosed with moderate malnutrition during her hospitalization.  Currently, patient reports diarrhea is improved. She does not use any anti-diarrhea medications. Noted dry mouth and oral thrush. She has tried full liquid diet plus toast and scrambled eggs without problems. She is drinking Ensure Max. Patient is ready to advance diet.  Nutrition Diagnosis: Food and Nutrition Related Knowledge Deficit related to cancer as evidenced by no prior need for nutrition related information.  Intervention: Educated on low fiber diet and provided tips to avoid a bowel obstruction. Continue ONS but supplement Ensure Plus or Ensure Complete instead of Ensure Max for additional calories. Drink BID between meals. Questions answered.  Will provide nutrition fact sheets on Low Fiber Diet and Preventing a Bowel Obstruction, samples of ONS and contact information. Patient will pick this up on Monday, March 24, when she comes to [facility] for PICC flush. Encouraged her to call me as needed for follow up.  Monitoring, Evaluation, Goal: Patient will tolerate diet advancement to low fiber diet.  Follow up to be scheduled as needed.

## 2024-03-17 ENCOUNTER — Other Ambulatory Visit (HOSPITAL_COMMUNITY): Payer: Self-pay

## 2024-03-20 ENCOUNTER — Other Ambulatory Visit (HOSPITAL_COMMUNITY): Payer: Self-pay

## 2024-03-20 ENCOUNTER — Inpatient Hospital Stay

## 2024-03-20 ENCOUNTER — Telehealth: Payer: Self-pay | Admitting: *Deleted

## 2024-03-20 DIAGNOSIS — C787 Secondary malignant neoplasm of liver and intrahepatic bile duct: Secondary | ICD-10-CM | POA: Diagnosis not present

## 2024-03-20 DIAGNOSIS — Z5111 Encounter for antineoplastic chemotherapy: Secondary | ICD-10-CM | POA: Diagnosis not present

## 2024-03-20 DIAGNOSIS — Z452 Encounter for adjustment and management of vascular access device: Secondary | ICD-10-CM

## 2024-03-20 DIAGNOSIS — R971 Elevated cancer antigen 125 [CA 125]: Secondary | ICD-10-CM | POA: Diagnosis not present

## 2024-03-20 DIAGNOSIS — K5669 Other partial intestinal obstruction: Secondary | ICD-10-CM | POA: Diagnosis not present

## 2024-03-20 DIAGNOSIS — C801 Malignant (primary) neoplasm, unspecified: Secondary | ICD-10-CM | POA: Diagnosis not present

## 2024-03-20 DIAGNOSIS — I2699 Other pulmonary embolism without acute cor pulmonale: Secondary | ICD-10-CM | POA: Diagnosis not present

## 2024-03-20 MED ORDER — SODIUM CHLORIDE 0.9% FLUSH
10.0000 mL | INTRAVENOUS | Status: DC | PRN
  Administered 2024-03-20: 10 mL via INTRAVENOUS

## 2024-03-20 MED ORDER — HEPARIN SOD (PORK) LOCK FLUSH 100 UNIT/ML IV SOLN
250.0000 [IU] | Freq: Once | INTRAVENOUS | Status: AC
  Administered 2024-03-20: 250 [IU] via INTRAVENOUS

## 2024-03-20 MED ORDER — DEXAMETHASONE 2 MG PO TABS
2.0000 mg | ORAL_TABLET | Freq: Every day | ORAL | 0 refills | Status: DC
  Filled 2024-03-20 – 2024-03-25 (×2): qty 30, 30d supply, fill #0

## 2024-03-20 MED ORDER — APIXABAN 5 MG PO TABS
5.0000 mg | ORAL_TABLET | Freq: Two times a day (BID) | ORAL | 5 refills | Status: DC
  Filled 2024-03-20: qty 60, 30d supply, fill #0
  Filled 2024-04-19: qty 60, 30d supply, fill #1

## 2024-03-20 MED ORDER — PANTOPRAZOLE SODIUM 40 MG PO TBEC
40.0000 mg | DELAYED_RELEASE_TABLET | Freq: Every day | ORAL | 5 refills | Status: DC
  Filled 2024-03-20 – 2024-03-25 (×2): qty 30, 30d supply, fill #0
  Filled 2024-04-19: qty 30, 30d supply, fill #1
  Filled 2024-05-27: qty 30, 30d supply, fill #2
  Filled 2024-06-22: qty 30, 30d supply, fill #3

## 2024-03-20 NOTE — Telephone Encounter (Signed)
 Ms. Dermody left VM requesting to speak w/Dr. Truett Perna when she is here today for PICC dressing change at 2:15 pm and also needs some refills. Called back and LVM requesting she list what refills she needs and that 2:15 is in middle of Dr. Kalman Drape new patient appointment. May I communicate to him her need and have him call her later in the day?

## 2024-03-20 NOTE — Telephone Encounter (Signed)
 Spoke w/Mrs. Mccomb in lobby and she is needing refills to [facility] for the following: Apixaban 5 mg bid Dexamethasone 2 mg daily Pantoprazole 40 mg daily Wishing to discuss not taking the 3rd chemotherapy since she is no longer having diarrhea and is feeling so well.

## 2024-03-20 NOTE — Telephone Encounter (Signed)
 MD spoke with patient and requests PICC flush/lab on 3/28 to draw a CA125

## 2024-03-21 ENCOUNTER — Other Ambulatory Visit: Payer: Self-pay | Admitting: Internal Medicine

## 2024-03-21 ENCOUNTER — Telehealth: Payer: Self-pay | Admitting: Oncology

## 2024-03-21 DIAGNOSIS — C801 Malignant (primary) neoplasm, unspecified: Secondary | ICD-10-CM

## 2024-03-21 NOTE — Progress Notes (Signed)
 Palliative Care Home Visit/Family Meeting Goals of Care Discussion  Members Present for Meeting: Patient present and able to make decisions for herself and articulate her preferences for care.  Other family members involved in the meeting included: 3 Daughters/ 2 Son-in Laws: Samara Deist (Jody), Alric Seton Rosanne Ashing), 1 son Theron Arista.  Patient Narrative:  Tracy Preston is a 89 year old woman with newly diagnosed Adenocarcinoma, presumably ovarian, who was up who was up until diagnosis extremely functional, walking up to 8 miles a day, independent and otherwise healthy.  She presented to the emergency department on 02/04/2024 with abdominal pain and swelling, weight loss, decreased appetite, nausea vomiting.  CT scan revealed peritoneal carcinomatosis with intussusception of the at the rectosigmoid junction with possible malignant obstruction.  Additionally she was found to have small pulmonary emboli.  She was admitted for further management of her bowel obstruction, malignancy workup.  She had consults with medical oncology, Dr. Truett Perna, GYN oncology Dr. Pricilla Holm and general surgery, Dr. Carolynne Edouard.  Palliative care was consulted on 2/13 to discuss goals of care and to help her weigh her treatment options and to provide support and symptom management and serious illness management.  At the time of initial palliative consultation the patient had refractory malignant obstruction she was started on octreotide infusion/injections and, dexamethasone, high-dose PPI and malignant obstruction palliative protocol.  Following initiation of this regimen she had dramatic and significant improvement.  We are able to remove the NG tube and she was able to begin taking small amounts of oral nutrition.  She was discharged home on short-term TPN but is now taking food by mouth and able to maintain her intake and enough calories for the TPN to be discontinued.  She was also given a long-acting octreotide injection in the  outpatient setting, by Dr. Truett Perna.  She is now status posthospitalization and during hospitalization elected to begin chemotherapy to try to slow the progression of her cancer -she has received 2 cycles of carboplatin and Taxol with 1 dose being given during her hospitalization and the other done as an outpatient on 3/10.  She was seen in our outpatient palliative clinic on 03/14/2024.  At the time of that visit [name] appeared frail somewhat fatigued and worn down with dry mucous membranes and a 6 pound weight loss.  She however reported being able to tolerate the last 2 doses of chemotherapy without serious or significant side effects although she did have several episodes of diarrhea and reduced p.o. intake.  After discussion in her clinic visit, we decided it would be helpful for the patient and her family if we had a goals of care meeting to include all family members who are advising her and collaborating with her on her care choices and decisions.  Summary of Meeting and Goals:  I met with Tracy Preston and her family on 03/18/2024 at her home.  Kira appeared to be doing significantly better after initiation of daily steroids at her last palliative care visit, she has more energy and patient reports that her appetite is much improved and that she is gaining weight again.  The main decision that has been weighing on her and other family members is regarding the continuation of cytotoxic chemotherapy for treatment of her cancer and trying to determine what the intended outcome of that is and weighing the benefits and risks of ongoing chemotherapy treatment.  Both patient and family understand that there is no cure for her cancer and that chemotherapy is being done for "palliative" purposes in order  to potentially slow cancer progression.  There is no targeted drug for her cancer noted on any of the genetic testing available for review.  Continuation of chemotherapy vs palliative care approach was  primary topic of conversation. I went over the medical aspects of her scans from her hospitalization and provided clarity on the likely source of the obstruction in the recto-sigmoid junction in terms of intussusception, and the numerous small metastatic implants in the mesentery of her abdomen-it was an important moment for this family to recognize that there was not a very large single obstructing mass that can be attributed to the malignant obstruction from external or internal compression.  The more likely cause a malignant obstruction had to do with intussusception which has likely resolved and was responsive to steroids, bowel decompression and octreotide -in addition the adenopathy and peritoneal implants affecting the mesentery can often interfere with neurovascular gut motility.  Her choices were weighed and considered within the context of Nala's goals of care and what she values in terms of her functional status, symptom burden and risk for cancer progression. We discussed the cumulative nature of chemotherapy side effects, her weight loss and nutritional status.  Additionally her risk for infection with already having thrush and also PICC line management.  While she got through the last 2 chemotherapy treatments, there is concern about her ability to tolerate future treatments in which the side effects may not be short-term and could cause lasting, irreversible impact on her quality of life. We weighed the investment of her time in pursuing chemotherapy treatment, the physical burdens with PICC line, weakness, nausea and diarrhea, functional dependence and what the end point may look like for at 5 cycles or potentially more vs. stopping chemotherapy now before the heavy accumulation of side effects occurs and allowing her body (and mind/spirit) to heal following this diagnosis, long hospitalization and obstruction which has resolved. Her main reason for doing chemotherapy is fear of the malignant  obstruction recurring, but we discussed ways to manage that in future if it is an issue.  I reassured her that palliative care could support her through additional chemotherapy if that is her choice or with a comfort focused approach to treating the symptoms of her cancer.  Family and patient are considering their options and report a better understanding of possible trajectories and possibilities moving forward. I provided space for them to ask questions, express any concerns or share fears about the future. They are very thoughtful people who have strong family bonds and want to make the very best decisions possible together. I am certain, regardless of the path forward for [facility], that she will be well supported and loved. She feels peace that all of her grandchildren have been able to visit and if hopeful that she can have more time and maintain her quality of life.   Will follow up as needed in palliative care clinic.  Anderson Malta, DO Palliative Medicine

## 2024-03-21 NOTE — Telephone Encounter (Signed)
 Left patient a vm regarding upcoming appointment

## 2024-03-23 ENCOUNTER — Other Ambulatory Visit: Payer: Self-pay | Admitting: Oncology

## 2024-03-23 ENCOUNTER — Other Ambulatory Visit (HOSPITAL_COMMUNITY): Payer: Self-pay

## 2024-03-24 ENCOUNTER — Inpatient Hospital Stay

## 2024-03-24 VITALS — BP 147/67 | HR 70 | Temp 98.2°F | Resp 18

## 2024-03-24 DIAGNOSIS — Z452 Encounter for adjustment and management of vascular access device: Secondary | ICD-10-CM

## 2024-03-24 DIAGNOSIS — C787 Secondary malignant neoplasm of liver and intrahepatic bile duct: Secondary | ICD-10-CM

## 2024-03-24 DIAGNOSIS — K5669 Other partial intestinal obstruction: Secondary | ICD-10-CM | POA: Diagnosis not present

## 2024-03-24 DIAGNOSIS — I2699 Other pulmonary embolism without acute cor pulmonale: Secondary | ICD-10-CM | POA: Diagnosis not present

## 2024-03-24 DIAGNOSIS — Z5111 Encounter for antineoplastic chemotherapy: Secondary | ICD-10-CM | POA: Diagnosis not present

## 2024-03-24 DIAGNOSIS — C801 Malignant (primary) neoplasm, unspecified: Secondary | ICD-10-CM | POA: Diagnosis not present

## 2024-03-24 DIAGNOSIS — R971 Elevated cancer antigen 125 [CA 125]: Secondary | ICD-10-CM | POA: Diagnosis not present

## 2024-03-24 MED ORDER — SODIUM CHLORIDE 0.9% FLUSH
10.0000 mL | INTRAVENOUS | Status: DC | PRN
  Administered 2024-03-24: 10 mL via INTRAVENOUS

## 2024-03-24 MED ORDER — HEPARIN SOD (PORK) LOCK FLUSH 100 UNIT/ML IV SOLN
500.0000 [IU] | Freq: Once | INTRAVENOUS | Status: AC
Start: 2024-03-24 — End: 2024-03-24
  Administered 2024-03-24: 500 [IU] via INTRAVENOUS

## 2024-03-25 ENCOUNTER — Other Ambulatory Visit (HOSPITAL_COMMUNITY): Payer: Self-pay

## 2024-03-25 LAB — CA 125: Cancer Antigen (CA) 125: 128 U/mL — ABNORMAL HIGH (ref 0.0–38.1)

## 2024-03-27 ENCOUNTER — Inpatient Hospital Stay

## 2024-03-27 ENCOUNTER — Inpatient Hospital Stay (HOSPITAL_BASED_OUTPATIENT_CLINIC_OR_DEPARTMENT_OTHER): Admitting: Oncology

## 2024-03-27 VITALS — BP 146/76 | HR 79 | Temp 98.2°F | Resp 17 | Wt 124.1 lb

## 2024-03-27 DIAGNOSIS — C801 Malignant (primary) neoplasm, unspecified: Secondary | ICD-10-CM | POA: Diagnosis not present

## 2024-03-27 DIAGNOSIS — C787 Secondary malignant neoplasm of liver and intrahepatic bile duct: Secondary | ICD-10-CM | POA: Diagnosis not present

## 2024-03-27 DIAGNOSIS — C786 Secondary malignant neoplasm of retroperitoneum and peritoneum: Secondary | ICD-10-CM | POA: Diagnosis not present

## 2024-03-27 DIAGNOSIS — R971 Elevated cancer antigen 125 [CA 125]: Secondary | ICD-10-CM | POA: Diagnosis not present

## 2024-03-27 DIAGNOSIS — Z5111 Encounter for antineoplastic chemotherapy: Secondary | ICD-10-CM | POA: Diagnosis not present

## 2024-03-27 DIAGNOSIS — I2699 Other pulmonary embolism without acute cor pulmonale: Secondary | ICD-10-CM | POA: Diagnosis not present

## 2024-03-27 DIAGNOSIS — K5669 Other partial intestinal obstruction: Secondary | ICD-10-CM | POA: Diagnosis not present

## 2024-03-27 LAB — CBC WITH DIFFERENTIAL (CANCER CENTER ONLY)
Abs Immature Granulocytes: 0.68 10*3/uL — ABNORMAL HIGH (ref 0.00–0.07)
Basophils Absolute: 0.1 10*3/uL (ref 0.0–0.1)
Basophils Relative: 0 %
Eosinophils Absolute: 0 10*3/uL (ref 0.0–0.5)
Eosinophils Relative: 0 %
HCT: 28 % — ABNORMAL LOW (ref 36.0–46.0)
Hemoglobin: 9.3 g/dL — ABNORMAL LOW (ref 12.0–15.0)
Immature Granulocytes: 4 %
Lymphocytes Relative: 18 %
Lymphs Abs: 2.9 10*3/uL (ref 0.7–4.0)
MCH: 32.4 pg (ref 26.0–34.0)
MCHC: 33.2 g/dL (ref 30.0–36.0)
MCV: 97.6 fL (ref 80.0–100.0)
Monocytes Absolute: 1.7 10*3/uL — ABNORMAL HIGH (ref 0.1–1.0)
Monocytes Relative: 11 %
Neutro Abs: 10.5 10*3/uL — ABNORMAL HIGH (ref 1.7–7.7)
Neutrophils Relative %: 67 %
Platelet Count: 270 10*3/uL (ref 150–400)
RBC: 2.87 MIL/uL — ABNORMAL LOW (ref 3.87–5.11)
RDW: 18.2 % — ABNORMAL HIGH (ref 11.5–15.5)
WBC Count: 15.7 10*3/uL — ABNORMAL HIGH (ref 4.0–10.5)
nRBC: 0.1 % (ref 0.0–0.2)

## 2024-03-27 LAB — CMP (CANCER CENTER ONLY)
ALT: 15 U/L (ref 0–44)
AST: 14 U/L — ABNORMAL LOW (ref 15–41)
Albumin: 3.3 g/dL — ABNORMAL LOW (ref 3.5–5.0)
Alkaline Phosphatase: 95 U/L (ref 38–126)
Anion gap: 8 (ref 5–15)
BUN: 23 mg/dL (ref 8–23)
CO2: 26 mmol/L (ref 22–32)
Calcium: 8.3 mg/dL — ABNORMAL LOW (ref 8.9–10.3)
Chloride: 104 mmol/L (ref 98–111)
Creatinine: 0.74 mg/dL (ref 0.44–1.00)
GFR, Estimated: >60 mL/min (ref >60)
Glucose, Bld: 120 mg/dL — ABNORMAL HIGH (ref 70–99)
Potassium: 3.4 mmol/L — ABNORMAL LOW (ref 3.5–5.1)
Sodium: 138 mmol/L (ref 135–145)
Total Bilirubin: 0.3 mg/dL (ref 0.0–1.2)
Total Protein: 6.2 g/dL — ABNORMAL LOW (ref 6.5–8.1)

## 2024-03-27 MED ORDER — HEPARIN SOD (PORK) LOCK FLUSH 100 UNIT/ML IV SOLN
500.0000 [IU] | Freq: Once | INTRAVENOUS | Status: AC
  Administered 2024-03-27: 250 [IU] via INTRAVENOUS

## 2024-03-27 NOTE — Progress Notes (Signed)
 [facility] OFFICE PROGRESS NOTE   Diagnosis: Gynecologic Preston  INTERVAL HISTORY:   Tracy Preston is here for a scheduled visit.  She is here with her son-in-law.  He completed cycle 2 paclitaxel/carboplatin on 03/06/2024.  She received G-CSF and Sandostatin LAR on 03/07/2024.  She reports developing malaise and diarrhea approximately 1 week following chemotherapy.  The diarrhea persisted for approximately 5 days.  She began Decadron after seeing Dr. Phillips Odor 03/21/2024.  Tracy Preston currently feels well.  She has a good appetite.  She has normal bowel movements.  No nausea/vomiting, pain, or neuropathy symptoms.  She is walking outside.    Objective:  Vital signs in last 24 hours:  Blood pressure (!) 146/76, pulse 79, temperature 98.2 F (36.8 C), temperature source Temporal, resp. rate 17, weight 124 lb 1.6 oz (56.3 kg), SpO2 99%.    HEENT: Mild white coat over the tongue, no buccal thrush Lymphatics: No left or right axillary nodes Resp: Lungs with coarse end inspiratory rhonchi at the posterior base bilaterally, no respiratory distress Cardio: Regular rate and rhythm GI: Mildly distended, no apparent ascites, nontender, no hepatosplenomegaly, no mass Vascular: Trace left greater than right lower leg edema   Portacath/PICC-without erythema  Lab Results:  Lab Results  Component Value Date   WBC 15.7 (H) 03/27/2024   HGB 9.3 (L) 03/27/2024   HCT 28.0 (L) 03/27/2024   MCV 97.6 03/27/2024   PLT 270 03/27/2024   NEUTROABS 10.5 (H) 03/27/2024    CMP  Lab Results  Component Value Date   NA 138 03/27/2024   K 3.4 (L) 03/27/2024   CL 104 03/27/2024   CO2 26 03/27/2024   GLUCOSE 120 (H) 03/27/2024   BUN 23 03/27/2024   CREATININE 0.74 03/27/2024   CALCIUM 8.3 (L) 03/27/2024   PROT 6.2 (L) 03/27/2024   ALBUMIN 3.3 (L) 03/27/2024   AST 14 (L) 03/27/2024   ALT 15 03/27/2024   ALKPHOS 95 03/27/2024   BILITOT 0.3 03/27/2024   GFRNONAA >60 03/27/2024      Medications: I have reviewed the patient's current medications.   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 CT abdomen/pelvis 02/04/2024: Diffuse carcinomatosis and mild ascites, distal small bowel obstruction, masslike soft tissue prominence in the region of the lower urine segment/cervix, liver metastases, mild retroperitoneal lymphadenopathy, rectal intussusception Ultrasound pelvis 02/05/2024: No ovarian mass, 2 cm heterogenous area of the uterine fundus, ascites Paracentesis 02/05/2024: Adenocarcinoma, cytokeratin 7, PAX8, and p53 positive, consistent with a gynecologic primary, ER positive; Foundation 1-HRD signature and microsatellite status cannot be determined, PIK3CA , no BRCA1 or BRCA2 alteration [employer]360 03/07/2024:PIK3CA, MSI high not detected Endometrial biopsy 02/07/2024: Negative for endometrial intraepithelial neoplasia and Preston MRI pelvis 02/11/2024: Abnormal soft tissue at the uterine segment and cervix, diffuse carcinomatosis, right greater than left adnexal soft tissue fullness felt to represent ovarian metastases, sigmoid/rectal intussusception, small volume pelvic fluid, and's proved small bowel dilation CT chest 02/11/2024: Acute pulmonary embolism of the distal right pulmonary artery and lobar/segmental right upper lobe and right middle lobe pulmonary artery branches, small layering right pleural effusion, 1.1 cm left axillary lymph node, upper abdominal ascites with nodular soft tissue caking in the left upper peritoneum, hypodense liver lesions Cycle 1 Taxol/carboplatin 02/12/2024 2 Taxol/carboplatin 03/06/2024 CT abdomen/pelvis 02/22/2024-persistent distal colonic intussusception, mild improvement in distal partial small bowel obstruction, colon decompressed, persistent ill-defined cervical bilateral adnexal masses, 1 hepatic metastasis slightly smaller, small right greater than left pleural effusions-new 2.   Small  bowel obstruction secondary to #1-NG tube placed discontinued 02/19/2024 Octreotide 02/23/2024 Sandostatin LAR 03/07/2024 3.  Nutrition-TPN 4.  Right sided pulmonary embolism noted on chest CT 02/11/2024: Heparin-transition to apixaban 02/26/2024 Lower extremity Dopplers 02/12/2024: Acute left peroneal and posterior tibial DVTs         Disposition: Tracy Preston has completed 2 cycles of paclitaxel/carboplatin.  Her overall clinical status has improved since beginning chemotherapy.  The CA125 is lower.  Bowel obstruction symptoms have resolved.  I suspect the metastatic tumor burden has responded to chemotherapy.  She understands it is possible the bowel obstruction spontaneously resolved or improved with Decadron/octreotide.  She had malaise and diarrhea following the last cycle of chemotherapy.  It is possible the diarrhea was related to chemotherapy, though this is unusual with paclitaxel and carboplatin.  Diarrhea may have been related to Sandostatin LAR or recovery of bowel function.  Tracy Preston is undecided on continuing chemotherapy.  She emphasized the importance of quality of life.  We discussed expected survival rates with and without chemotherapy.  She understands the goals of chemotherapy are to palliate symptoms and prolong survival.  She is at risk for developing a recurrent bowel obstruction.  She would like to hold chemotherapy today.  She we will consider options further over the next week.  She will let us know her decision regarding further chemotherapy.  She plans to continue long-acting octreotide.  Tracy Preston will be scheduled for paclitaxel/carboplatin, PICC care, and an office visit in 1 week.  We will arrange for PICC removal if she decides against further chemotherapy.  Thornton Papas, MD  03/27/2024  1:17 PM

## 2024-03-28 ENCOUNTER — Ambulatory Visit

## 2024-03-30 ENCOUNTER — Inpatient Hospital Stay: Attending: Oncology | Admitting: Nurse Practitioner

## 2024-03-30 VITALS — BP 142/59 | HR 70 | Temp 98.0°F | Resp 17 | Ht 64.0 in | Wt 123.4 lb

## 2024-03-30 DIAGNOSIS — C787 Secondary malignant neoplasm of liver and intrahepatic bile duct: Secondary | ICD-10-CM | POA: Insufficient documentation

## 2024-03-30 DIAGNOSIS — Z7901 Long term (current) use of anticoagulants: Secondary | ICD-10-CM | POA: Insufficient documentation

## 2024-03-30 DIAGNOSIS — Z86711 Personal history of pulmonary embolism: Secondary | ICD-10-CM | POA: Insufficient documentation

## 2024-03-30 DIAGNOSIS — Z5111 Encounter for antineoplastic chemotherapy: Secondary | ICD-10-CM | POA: Insufficient documentation

## 2024-03-30 DIAGNOSIS — C801 Malignant (primary) neoplasm, unspecified: Secondary | ICD-10-CM | POA: Insufficient documentation

## 2024-03-30 DIAGNOSIS — Z79899 Other long term (current) drug therapy: Secondary | ICD-10-CM | POA: Insufficient documentation

## 2024-03-30 DIAGNOSIS — Z515 Encounter for palliative care: Secondary | ICD-10-CM | POA: Diagnosis not present

## 2024-03-30 MED ORDER — MAGNESIUM 400 MG PO CAPS: 400.0000 mg | ORAL_CAPSULE | Freq: Every day | ORAL | 5 refills | Status: DC

## 2024-03-31 ENCOUNTER — Encounter: Payer: Self-pay | Admitting: Oncology

## 2024-04-03 ENCOUNTER — Inpatient Hospital Stay

## 2024-04-03 ENCOUNTER — Inpatient Hospital Stay (HOSPITAL_BASED_OUTPATIENT_CLINIC_OR_DEPARTMENT_OTHER): Admitting: Oncology

## 2024-04-03 VITALS — BP 135/73 | HR 71 | Temp 98.1°F | Resp 18 | Ht 64.0 in | Wt 122.2 lb

## 2024-04-03 DIAGNOSIS — C801 Malignant (primary) neoplasm, unspecified: Secondary | ICD-10-CM | POA: Diagnosis not present

## 2024-04-03 DIAGNOSIS — Z79899 Other long term (current) drug therapy: Secondary | ICD-10-CM | POA: Diagnosis not present

## 2024-04-03 DIAGNOSIS — C786 Secondary malignant neoplasm of retroperitoneum and peritoneum: Secondary | ICD-10-CM | POA: Diagnosis not present

## 2024-04-03 DIAGNOSIS — Z7901 Long term (current) use of anticoagulants: Secondary | ICD-10-CM | POA: Diagnosis not present

## 2024-04-03 DIAGNOSIS — Z5111 Encounter for antineoplastic chemotherapy: Secondary | ICD-10-CM | POA: Diagnosis not present

## 2024-04-03 DIAGNOSIS — C787 Secondary malignant neoplasm of liver and intrahepatic bile duct: Secondary | ICD-10-CM | POA: Diagnosis not present

## 2024-04-03 DIAGNOSIS — K56609 Unspecified intestinal obstruction, unspecified as to partial versus complete obstruction: Secondary | ICD-10-CM

## 2024-04-03 DIAGNOSIS — Z86711 Personal history of pulmonary embolism: Secondary | ICD-10-CM | POA: Diagnosis not present

## 2024-04-03 LAB — CBC WITH DIFFERENTIAL (CANCER CENTER ONLY)
Abs Immature Granulocytes: 0.29 10*3/uL — ABNORMAL HIGH (ref 0.00–0.07)
Basophils Absolute: 0.1 10*3/uL (ref 0.0–0.1)
Basophils Relative: 0 %
Eosinophils Absolute: 0.1 10*3/uL (ref 0.0–0.5)
Eosinophils Relative: 1 %
HCT: 31.7 % — ABNORMAL LOW (ref 36.0–46.0)
Hemoglobin: 10.3 g/dL — ABNORMAL LOW (ref 12.0–15.0)
Immature Granulocytes: 2 %
Lymphocytes Relative: 38 %
Lymphs Abs: 4.9 10*3/uL — ABNORMAL HIGH (ref 0.7–4.0)
MCH: 31.2 pg (ref 26.0–34.0)
MCHC: 32.5 g/dL (ref 30.0–36.0)
MCV: 96.1 fL (ref 80.0–100.0)
Monocytes Absolute: 1.3 10*3/uL — ABNORMAL HIGH (ref 0.1–1.0)
Monocytes Relative: 10 %
Neutro Abs: 6.3 10*3/uL (ref 1.7–7.7)
Neutrophils Relative %: 49 %
Platelet Count: 418 10*3/uL — ABNORMAL HIGH (ref 150–400)
RBC: 3.3 MIL/uL — ABNORMAL LOW (ref 3.87–5.11)
RDW: 17.9 % — ABNORMAL HIGH (ref 11.5–15.5)
WBC Count: 12.9 10*3/uL — ABNORMAL HIGH (ref 4.0–10.5)
nRBC: 0 % (ref 0.0–0.2)

## 2024-04-03 LAB — CMP (CANCER CENTER ONLY)
ALT: 15 U/L (ref 0–44)
AST: 13 U/L — ABNORMAL LOW (ref 15–41)
Albumin: 3.5 g/dL (ref 3.5–5.0)
Alkaline Phosphatase: 85 U/L (ref 38–126)
Anion gap: 8 (ref 5–15)
BUN: 21 mg/dL (ref 8–23)
CO2: 27 mmol/L (ref 22–32)
Calcium: 8.7 mg/dL — ABNORMAL LOW (ref 8.9–10.3)
Chloride: 108 mmol/L (ref 98–111)
Creatinine: 0.83 mg/dL (ref 0.44–1.00)
GFR, Estimated: >60 mL/min (ref >60)
Glucose, Bld: 113 mg/dL — ABNORMAL HIGH (ref 70–99)
Potassium: 3.4 mmol/L — ABNORMAL LOW (ref 3.5–5.1)
Sodium: 143 mmol/L (ref 135–145)
Total Bilirubin: 0.3 mg/dL (ref 0.0–1.2)
Total Protein: 6.4 g/dL — ABNORMAL LOW (ref 6.5–8.1)

## 2024-04-03 LAB — MAGNESIUM: Magnesium: 1.8 mg/dL (ref 1.7–2.4)

## 2024-04-03 MED ORDER — SODIUM CHLORIDE 0.9% FLUSH: 10.0000 mL | INTRAVENOUS | Status: DC | PRN

## 2024-04-03 MED ORDER — SODIUM CHLORIDE 0.9 % IV SOLN: INTRAVENOUS | Status: DC

## 2024-04-03 MED ORDER — DIPHENHYDRAMINE HCL 50 MG/ML IJ SOLN
25.0000 mg | Freq: Once | INTRAMUSCULAR | Status: AC
  Administered 2024-04-03: 25 mg via INTRAVENOUS
  Filled 2024-04-03: qty 1

## 2024-04-03 MED ORDER — DEXAMETHASONE SODIUM PHOSPHATE 10 MG/ML IJ SOLN
10.0000 mg | Freq: Once | INTRAMUSCULAR | Status: AC
  Administered 2024-04-03: 10 mg via INTRAVENOUS
  Filled 2024-04-03: qty 1

## 2024-04-03 MED ORDER — PACLITAXEL CHEMO INJECTION 300 MG/50ML
135.0000 mg/m2 | Freq: Once | INTRAVENOUS | Status: AC
  Administered 2024-04-03: 210 mg via INTRAVENOUS
  Filled 2024-04-03: qty 35

## 2024-04-03 MED ORDER — SODIUM CHLORIDE 0.9 % IV SOLN
150.0000 mg | Freq: Once | INTRAVENOUS | Status: AC
  Administered 2024-04-03: 150 mg via INTRAVENOUS
  Filled 2024-04-03: qty 150

## 2024-04-03 MED ORDER — PALONOSETRON HCL INJECTION 0.25 MG/5ML
0.2500 mg | Freq: Once | INTRAVENOUS | Status: AC
  Administered 2024-04-03: 0.25 mg via INTRAVENOUS
  Filled 2024-04-03: qty 5

## 2024-04-03 MED ORDER — FAMOTIDINE IN NACL 20-0.9 MG/50ML-% IV SOLN
20.0000 mg | Freq: Once | INTRAVENOUS | Status: AC
  Administered 2024-04-03: 20 mg via INTRAVENOUS
  Filled 2024-04-03: qty 50

## 2024-04-03 MED ORDER — OCTREOTIDE ACETATE 30 MG IM KIT
30.0000 mg | PACK | Freq: Once | INTRAMUSCULAR | Status: AC
  Administered 2024-04-03: 30 mg via INTRAMUSCULAR
  Filled 2024-04-03: qty 1

## 2024-04-03 MED ORDER — SODIUM CHLORIDE 0.9 % IV SOLN
297.0000 mg | Freq: Once | INTRAVENOUS | Status: AC
Start: 2024-04-03 — End: 2024-04-03
  Administered 2024-04-03: 300 mg via INTRAVENOUS
  Filled 2024-04-03: qty 30

## 2024-04-03 MED ORDER — HEPARIN SOD (PORK) LOCK FLUSH 100 UNIT/ML IV SOLN: 500.0000 [IU] | Freq: Once | INTRAVENOUS | Status: DC | PRN

## 2024-04-03 NOTE — Progress Notes (Signed)
 Patient seen by Dr. Thornton Papas today  Vitals are within treatment parameters:Yes   Labs are within treatment parameters: Yes  Do not need Mg+ results before being treated today-was added on per patient request.  Treatment plan has been signed: Yes   Per physician team, Patient is ready for treatment and there are NO modifications to the treatment plan.

## 2024-04-03 NOTE — Patient Instructions (Addendum)
 [facility] A DEPT OF [facility]   Discharge Instructions: Thank you for choosing [facility] to provide your oncology and hematology care.   If you have a lab appointment with the Cancer Center, please go directly to the Cancer Center and check in at the registration area.   Wear comfortable clothing and clothing appropriate for easy access to any Portacath or PICC line.   We strive to give you quality time with your provider. You may need to reschedule your appointment if you arrive late (15 or more minutes).  Arriving late affects you and other patients whose appointments are after yours.  Also, if you miss three or more appointments without notifying the office, you may be dismissed from the clinic at the provider's discretion.      For prescription refill requests, have your pharmacy contact our office and allow 72 hours for refills to be completed.    Today you received the following chemotherapy and/or immunotherapy agents TAXOL/CARBOPLATIN/SANDOSTATIN      To help prevent nausea and vomiting after your treatment, we encourage you to take your nausea medication as directed.  BELOW ARE SYMPTOMS THAT SHOULD BE REPORTED IMMEDIATELY: *FEVER GREATER THAN 100.4 F (38 C) OR HIGHER *CHILLS OR SWEATING *NAUSEA AND VOMITING THAT IS NOT CONTROLLED WITH YOUR NAUSEA MEDICATION *UNUSUAL SHORTNESS OF BREATH *UNUSUAL BRUISING OR BLEEDING *URINARY PROBLEMS (pain or burning when urinating, or frequent urination) *BOWEL PROBLEMS (unusual diarrhea, constipation, pain near the anus) TENDERNESS IN MOUTH AND THROAT WITH OR WITHOUT PRESENCE OF ULCERS (sore throat, sores in mouth, or a toothache) UNUSUAL RASH, SWELLING OR PAIN  UNUSUAL VAGINAL DISCHARGE OR ITCHING   Items with * indicate a potential emergency and should be followed up as soon as possible or go to the Emergency Department if any problems should occur.  Please show the CHEMOTHERAPY ALERT  CARD or IMMUNOTHERAPY ALERT CARD at check-in to the Emergency Department and triage nurse.  Should you have questions after your visit or need to cancel or reschedule your appointment, please contact [facility]  Dept: [phone]  and follow the prompts.  Office hours are 8:00 a.m. to 4:30 p.m. Monday - Friday. Please note that voicemails left after 4:00 p.m. may not be returned until the following business day.  We are closed weekends and major holidays. You have access to a nurse at all times for urgent questions. Please call the main number to the clinic Dept: [phone] and follow the prompts.   For any non-urgent questions, you may also contact your provider using MyChart. We now offer e-Visits for anyone 65 and older to request care online for non-urgent symptoms. For details visit [email or web address].   Also download the MyChart app! Go to the app store, search "MyChart", open the app, select [place], and log in with your MyChart username and password.  Paclitaxel Injection What is this medication? PACLITAXEL (PAK li TAX el) treats some types of cancer. It works by slowing down the growth of cancer cells. This medicine may be used for other purposes; ask your health care provider or pharmacist if you have questions. COMMON BRAND NAME(S): Onxol, Taxol What should I tell my care team before I take this medication? They need to know if you have any of these conditions: Heart disease Liver disease Low white blood cell levels An unusual or allergic reaction to paclitaxel, other medications, foods, dyes, or preservatives  If you or your partner are pregnant or trying to get pregnant Breast-feeding How should I use this medication? This medication is injected into a vein. It is given by your care team in a hospital or clinic setting. Talk to your care team about the use of this medication in children. While it may be given to  children for selected conditions, precautions do apply. Overdosage: If you think you have taken too much of this medicine contact a poison control center or emergency room at once. NOTE: This medicine is only for you. Do not share this medicine with others. What if I miss a dose? Keep appointments for follow-up doses. It is important not to miss your dose. Call your care team if you are unable to keep an appointment. What may interact with this medication? Do not take this medication with any of the following: Live virus vaccines Other medications may affect the way this medication works. Talk with your care team about all of the medications you take. They may suggest changes to your treatment plan to lower the risk of side effects and to make sure your medications work as intended. This list may not describe all possible interactions. Give your health care provider a list of all the medicines, herbs, non-prescription drugs, or dietary supplements you use. Also tell them if you smoke, drink alcohol, or use illegal drugs. Some items may interact with your medicine. What should I watch for while using this medication? Your condition will be monitored carefully while you are receiving this medication. You may need blood work while taking this medication. This medication may make you feel generally unwell. This is not uncommon as chemotherapy can affect healthy cells as well as cancer cells. Report any side effects. Continue your course of treatment even though you feel ill unless your care team tells you to stop. This medication can cause serious allergic reactions. To reduce the risk, your care team may give you other medications to take before receiving this one. Be sure to follow the directions from your care team. This medication may increase your risk of getting an infection. Call your care team for advice if you get a fever, chills, sore throat, or other symptoms of a cold or flu. Do not treat  yourself. Try to avoid being around people who are sick. This medication may increase your risk to bruise or bleed. Call your care team if you notice any unusual bleeding. Be careful brushing or flossing your teeth or using a toothpick because you may get an infection or bleed more easily. If you have any dental work done, tell your dentist you are receiving this medication. Talk to your care team if you may be pregnant. Serious birth defects can occur if you take this medication during pregnancy. Talk to your care team before breastfeeding. Changes to your treatment plan may be needed. What side effects may I notice from receiving this medication? Side effects that you should report to your care team as soon as possible: Allergic reactions--skin rash, itching, hives, swelling of the face, lips, tongue, or throat Heart rhythm changes--fast or irregular heartbeat, dizziness, feeling faint or lightheaded, chest pain, trouble breathing Increase in blood pressure Infection--fever, chills, cough, sore throat, wounds that don't heal, pain or trouble when passing urine, general feeling of discomfort or being unwell Low blood pressure--dizziness, feeling faint or lightheaded, blurry vision Low red blood cell level--unusual weakness or fatigue, dizziness, headache, trouble breathing Painful swelling, warmth, or redness of the skin, blisters  or sores at the infusion site Pain, tingling, or numbness in the hands or feet Slow heartbeat--dizziness, feeling faint or lightheaded, confusion, trouble breathing, unusual weakness or fatigue Unusual bruising or bleeding Side effects that usually do not require medical attention (report to your care team if they continue or are bothersome): Diarrhea Hair loss Joint pain Loss of appetite Muscle pain Nausea Vomiting This list may not describe all possible side effects. Call your doctor for medical advice about side effects. You may report side effects to FDA at  1-800-FDA-1088. Where should I keep my medication? This medication is given in a hospital or clinic. It will not be stored at home. NOTE: This sheet is a summary. It may not cover all possible information. If you have questions about this medicine, talk to your doctor, pharmacist, or health care provider.  2024 Elsevier/Gold Standard (2022-05-05 00:00:00)  Carboplatin Injection What is this medication? CARBOPLATIN (KAR boe pla tin) treats some types of cancer. It works by slowing down the growth of cancer cells. This medicine may be used for other purposes; ask your health care provider or pharmacist if you have questions. COMMON BRAND NAME(S): Paraplatin What should I tell my care team before I take this medication? They need to know if you have any of these conditions: Blood disorders Hearing problems Kidney disease Recent or ongoing radiation therapy An unusual or allergic reaction to carboplatin, cisplatin, other medications, foods, dyes, or preservatives Pregnant or trying to get pregnant Breast-feeding How should I use this medication? This medication is injected into a vein. It is given by your care team in a hospital or clinic setting. Talk to your care team about the use of this medication in children. Special care may be needed. Overdosage: If you think you have taken too much of this medicine contact a poison control center or emergency room at once. NOTE: This medicine is only for you. Do not share this medicine with others. What if I miss a dose? Keep appointments for follow-up doses. It is important not to miss your dose. Call your care team if you are unable to keep an appointment. What may interact with this medication? Medications for seizures Some antibiotics, such as amikacin, gentamicin, neomycin, streptomycin, tobramycin Vaccines This list may not describe all possible interactions. Give your health care provider a list of all the medicines, herbs,  non-prescription drugs, or dietary supplements you use. Also tell them if you smoke, drink alcohol, or use illegal drugs. Some items may interact with your medicine. What should I watch for while using this medication? Your condition will be monitored carefully while you are receiving this medication. You may need blood work while taking this medication. This medication may make you feel generally unwell. This is not uncommon, as chemotherapy can affect healthy cells as well as cancer cells. Report any side effects. Continue your course of treatment even though you feel ill unless your care team tells you to stop. In some cases, you may be given additional medications to help with side effects. Follow all directions for their use. This medication may increase your risk of getting an infection. Call your care team for advice if you get a fever, chills, sore throat, or other symptoms of a cold or flu. Do not treat yourself. Try to avoid being around people who are sick. Avoid taking medications that contain aspirin, acetaminophen, ibuprofen, naproxen, or ketoprofen unless instructed by your care team. These medications may hide a fever. Be careful brushing or flossing your  teeth or using a toothpick because you may get an infection or bleed more easily. If you have any dental work done, tell your dentist you are receiving this medication. Talk to your care team if you wish to become pregnant or think you might be pregnant. This medication can cause serious birth defects. Talk to your care team about effective forms of contraception. Do not breast-feed while taking this medication. What side effects may I notice from receiving this medication? Side effects that you should report to your care team as soon as possible: Allergic reactions--skin rash, itching, hives, swelling of the face, lips, tongue, or throat Infection--fever, chills, cough, sore throat, wounds that don't heal, pain or trouble when passing  urine, general feeling of discomfort or being unwell Low red blood cell level--unusual weakness or fatigue, dizziness, headache, trouble breathing Pain, tingling, or numbness in the hands or feet, muscle weakness, change in vision, confusion or trouble speaking, loss of balance or coordination, trouble walking, seizures Unusual bruising or bleeding Side effects that usually do not require medical attention (report to your care team if they continue or are bothersome): Hair loss Nausea Unusual weakness or fatigue Vomiting This list may not describe all possible side effects. Call your doctor for medical advice about side effects. You may report side effects to FDA at 1-800-FDA-1088. Where should I keep my medication? This medication is given in a hospital or clinic. It will not be stored at home. NOTE: This sheet is a summary. It may not cover all possible information. If you have questions about this medicine, talk to your doctor, pharmacist, or health care provider.  2024 Elsevier/Gold Standard (2022-04-07 00:00:00)  Octreotide Injection Solution What is this medication? OCTREOTIDE (ok TREE oh tide) treats high levels of growth hormone (acromegaly). It works by reducing the amount of growth hormone your body makes. This reduces symptoms and the risk of health problems caused by too much growth hormone, such as diabetes and heart disease. It may also be used to treat diarrhea caused by neuroendocrine tumors. It works by slowing down the release of serotonin from the tumor cells. This reduces the number of bowel movements you have. This medicine may be used for other purposes; ask your health care provider or pharmacist if you have questions. COMMON BRAND NAME(S): Berline Lopes, Sandostatin What should I tell my care team before I take this medication? They need to know if you have any of these conditions: Diabetes Gallbladder disease Heart disease Kidney disease Liver disease Pancreatic  disease Thyroid disease An unusual or allergic reaction to octreotide, other medications, foods, dyes, or preservatives Pregnant or trying to get pregnant Breastfeeding How should I use this medication? This medication is injected under the skin or into a vein. It is usually given by your care team in a hospital or clinic setting. If you get this medication at home, you will be taught how to prepare and give it. Use exactly as directed. Take it as directed on the prescription label at the same time every day. Keep taking it unless your care team tells you to stop. Allow the injection solution to come to room temperature before use. Do not warm it artificially. It is important that you put your used needles and syringes in a special sharps container. Do not put them in a trash can. If you do not have a sharps container, call your pharmacist or care team to get one. Talk to your care team about the use of this medication in children.  Special care may be needed. Overdosage: If you think you have taken too much of this medicine contact a poison control center or emergency room at once. NOTE: This medicine is only for you. Do not share this medicine with others. What if I miss a dose? If you miss a dose, take it as soon as you can. If it is almost time for your next dose, take only that dose. Do not take double or extra doses. What may interact with this medication? Bromocriptine Certain medications for blood pressure, heart disease, irregular heartbeat Cyclosporine Diuretics Medications for diabetes, including insulin Quinidine This list may not describe all possible interactions. Give your health care provider a list of all the medicines, herbs, non-prescription drugs, or dietary supplements you use. Also tell them if you smoke, drink alcohol, or use illegal drugs. Some items may interact with your medicine. What should I watch for while using this medication? Visit your care team for regular  checks on your progress. Tell your care team if your symptoms do not start to get better or if they get worse. To help reduce irritation at the injection site, use a different site for each injection and make sure the solution is at room temperature before use. This medication may cause decreases in blood sugar. Signs of low blood sugar include chills, cool, pale skin or cold sweats, drowsiness, extreme hunger, fast heartbeat, headache, nausea, nervousness or anxiety, shakiness, trembling, unsteadiness, tiredness, or weakness. Contact your care team right away if you experience any of these symptoms. This medication may increase blood sugar. The risk may be higher in patients who already have diabetes. Ask your care team what you can do to lower your risk of diabetes while taking this medication. You should make sure you get enough vitamin B12 while you are taking this medication. Discuss the foods you eat and the vitamins you take with your care team. What side effects may I notice from receiving this medication? Side effects that you should report to your care team as soon as possible: Allergic reactions--skin rash, itching, hives, swelling of the face, lips, tongue, or throat Gallbladder problems--severe stomach pain, nausea, vomiting, fever Heart rhythm changes--fast or irregular heartbeat, dizziness, feeling faint or lightheaded, chest pain, trouble breathing High blood sugar (hyperglycemia)--increased thirst or amount of urine, unusual weakness or fatigue, blurry vision Low blood sugar (hypoglycemia)--tremors or shaking, anxiety, sweating, cold or clammy skin, confusion, dizziness, rapid heartbeat Low thyroid levels (hypothyroidism)--unusual weakness or fatigue, increased sensitivity to cold, constipation, hair loss, dry skin, weight gain, feelings of depression Low vitamin B12 level--pain, tingling, or numbness in the hands or feet, muscle weakness, dizziness, confusion, trouble  concentrating Oily or light-colored stools, diarrhea, bloating, weight loss Pancreatitis--severe stomach pain that spreads to your back or gets worse after eating or when touched, fever, nausea, vomiting Slow heartbeat--dizziness, feeling faint or lightheaded, confusion, trouble breathing, unusual weakness or fatigue Side effects that usually do not require medical attention (report these to your care team if they continue or are bothersome): Diarrhea Dizziness Headache Nausea Pain, redness, or irritation at injection site Stomach pain This list may not describe all possible side effects. Call your doctor for medical advice about side effects. You may report side effects to FDA at 1-800-FDA-1088. Where should I keep my medication? Keep out of the reach of children and pets. Store in the refrigerator. Protect from light. Allow to come to room temperature naturally. Do not use artificial heat. If protected from light, the injection may be  stored between 20 and 30 degrees C (70 and 86 degrees F) for 14 days. After the initial use, throw away any unused portion of a multiple dose vial after 14 days. Get rid of any unused portions of the ampules after use. To get rid of medications that are no longer needed or have expired: Take the medication to a medication take-back program. Ask your pharmacy or law enforcement to find a location. If you cannot return the medication, ask your pharmacist or care team how to get rid of the medication safely. NOTE: This sheet is a summary. It may not cover all possible information. If you have questions about this medicine, talk to your doctor, pharmacist, or health care provider.  2024 Elsevier/Gold Standard (2023-11-26 00:00:00)

## 2024-04-03 NOTE — Progress Notes (Signed)
 [facility] OFFICE PROGRESS NOTE   Diagnosis: Gynecologic malignancy  INTERVAL HISTORY:   Ms. Bunning returns as scheduled.  She feels well.  No nausea, pain, or neuropathy symptoms.  She is having bowel movements.  She is getting out of the house and driving.  She has decided to proceed with a third cycle of paclitaxel/carboplatin.  Objective:  Vital signs in last 24 hours:  Blood pressure 135/73, pulse 71, temperature 98.1 F (36.7 C), temperature source Temporal, resp. rate 18, height 5\' 4"  (1.626 m), weight 122 lb 3.2 oz (55.4 kg), SpO2 98%.    HEENT: No thrush or ulcers, 2 mm ecchymosis of the left buccal mucosa Lymphatics: No left axillary nodes Resp: End inspiratory rhonchi at the posterior base bilaterally, no respiratory distress Cardio: Regular rate and rhythm GI: Distended and soft.  No mass.  No hepatosplenomegaly, no apparent ascites Vascular: No leg edema    Portacath/PICC-without erythema  Lab Results:  Lab Results  Component Value Date   WBC 12.9 (H) 04/03/2024   HGB 10.3 (L) 04/03/2024   HCT 31.7 (L) 04/03/2024   MCV 96.1 04/03/2024   PLT 418 (H) 04/03/2024   NEUTROABS 6.3 04/03/2024    CMP  Lab Results  Component Value Date   NA 143 04/03/2024   K 3.4 (L) 04/03/2024   CL 108 04/03/2024   CO2 27 04/03/2024   GLUCOSE 113 (H) 04/03/2024   BUN 21 04/03/2024   CREATININE 0.83 04/03/2024   CALCIUM 8.7 (L) 04/03/2024   PROT 6.4 (L) 04/03/2024   ALBUMIN 3.5 04/03/2024   AST 13 (L) 04/03/2024   ALT 15 04/03/2024   ALKPHOS 85 04/03/2024   BILITOT 0.3 04/03/2024   GFRNONAA >60 04/03/2024    Lab Results  Component Value Date   CEA1 2.7 02/05/2024   WUJ811 18 02/05/2024    Lab Results  Component Value Date   INR 1.0 02/25/2024   LABPROT 13.5 02/25/2024    Imaging:  No results found.  Medications: I have reviewed the patient's current medications.   Assessment/Plan: Metastatic carcinoma-clinical presentation and  cytology consistent with a GYN primary CT abdomen/pelvis 02/04/2024: Diffuse carcinomatosis and mild ascites, distal small bowel obstruction, masslike soft tissue prominence in the region of the lower urine segment/cervix, liver metastases, mild retroperitoneal lymphadenopathy, rectal intussusception Ultrasound pelvis 02/05/2024: No ovarian mass, 2 cm heterogenous area of the uterine fundus, ascites Paracentesis 02/05/2024: Adenocarcinoma, cytokeratin 7, PAX8, and p53 positive, consistent with a gynecologic primary, ER positive; Foundation 1-HRD signature and microsatellite status cannot be determined, PIK3CA , no BRCA1 or BRCA2 alteration Guardant360 03/07/2024:PIK3CA, MSI high not detected Endometrial biopsy 02/07/2024: Negative for endometrial intraepithelial neoplasia and malignancy MRI pelvis 02/11/2024: Abnormal soft tissue at the uterine segment and cervix, diffuse carcinomatosis, right greater than left adnexal soft tissue fullness felt to represent ovarian metastases, sigmoid/rectal intussusception, small volume pelvic fluid, and's proved small bowel dilation CT chest 02/11/2024: Acute pulmonary embolism of the distal right pulmonary artery and lobar/segmental right upper lobe and right middle lobe pulmonary artery branches, small layering right pleural effusion, 1.1 cm left axillary lymph node, upper abdominal ascites with nodular soft tissue caking in the left upper peritoneum, hypodense liver lesions Cycle 1 Taxol/carboplatin 02/12/2024 Cycle 2 Taxol/carboplatin 03/06/2024 Cycle 3 Taxol/carboplatin 04/03/2024 CT abdomen/pelvis 02/22/2024-persistent distal colonic intussusception, mild improvement in distal partial small bowel obstruction, colon decompressed, persistent ill-defined cervical bilateral adnexal masses, 1 hepatic metastasis slightly smaller, small right greater than left pleural effusions-new 2.  Small bowel obstruction secondary  to #1-NG tube placed discontinued 02/19/2024 Octreotide  02/23/2024 Sandostatin LAR 03/07/2024 3.  Nutrition-TPN 4.  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  She has completed 2 cycles of paclitaxel/carboplatin.  She has experienced clinical improvement.  She had diarrhea and malaise following the last cycle of chemotherapy.  She has decided to proceed with a third cycle of paclitaxel/carboplatin today.  She understands the potential for diarrhea following this cycle.  She will again receive long-acting octreotide and G-CSF support.  Ms. Mago will be scheduled for a restaging CT abdomen/pelvis and PICC removal on 04/17/2024.  She will return for an office visit on 04/19/2024.  Thornton Papas, MD  04/03/2024  10:14 AM

## 2024-04-03 NOTE — Progress Notes (Signed)
 Palliative Care Follow-up Visit Goals of Care Discussion  Tracy Preston is in today for follow-up. Since her last visit, she is significantly improved. She has gained weight, tolerated advancing her diet and is now walking for exercise and activity daily. Her abdomen is soft and non-tender. She appears generally well and strong today.   She is at her visit today with her daughter Tracy Preston and son-in Financial risk analyst. The decision about pursuing additional chemotherapy and cancer directed treatment has been weighing heavily on her- she tolerated the last two chemotherapy tx fairly well with short term symptoms of nausea, diarrhea and fatigue/generalized weakness, but was also during that time hospitalized for malignant obstruction which has resolved completely (review of images indicated obstruction was likely rectosigmoid intussusception without overt evidence of mass effect at the transition point). Imaging indicated scattered omental disease. She requested a visit today to re-visit/discuss a decision she has made to pursue a 3rd round of chemotherapy. I reassured her that palliative care could support her through this and that goals of care can change and we can adapt to what her needs are. She is reasonably most interested in controlling the cancer to give her self more time and to prevent another malignant obstruction, but not at the expense of her quality of life-she can get through brief periods of chemo related side effects and symptoms if she knows she can recover from them. We dicussed the uncertainty of the benefits of chemotherapy and difficulty of knowing when to stop compared to allowing a natural disease course to occur with ongoing aggressive management of the symptoms of her cancer progression. She has done quite well off of chemotherapy for the past couple of weeks-but she feels like can and wants to go through one more cycle and will accept the outcome benefits or otherwise. Tracy Preston tells me she feels at  peace with this decision.   Plan for chemotherapy Monday. We discussed continuation of steroids and GI prophylaxis as well as hydration and rest during this time. She will reach out with any needs.   Follow-up as needed.  Tracy Malta, DO Palliative Medicine   Time: 60 minutes

## 2024-04-04 ENCOUNTER — Inpatient Hospital Stay

## 2024-04-04 ENCOUNTER — Other Ambulatory Visit: Payer: Self-pay | Admitting: *Deleted

## 2024-04-04 VITALS — BP 148/64 | HR 77 | Temp 97.3°F | Resp 18

## 2024-04-04 DIAGNOSIS — Z79899 Other long term (current) drug therapy: Secondary | ICD-10-CM | POA: Diagnosis not present

## 2024-04-04 DIAGNOSIS — Z5111 Encounter for antineoplastic chemotherapy: Secondary | ICD-10-CM | POA: Diagnosis not present

## 2024-04-04 DIAGNOSIS — Z7901 Long term (current) use of anticoagulants: Secondary | ICD-10-CM | POA: Diagnosis not present

## 2024-04-04 DIAGNOSIS — C786 Secondary malignant neoplasm of retroperitoneum and peritoneum: Secondary | ICD-10-CM

## 2024-04-04 DIAGNOSIS — C787 Secondary malignant neoplasm of liver and intrahepatic bile duct: Secondary | ICD-10-CM | POA: Diagnosis not present

## 2024-04-04 DIAGNOSIS — Z452 Encounter for adjustment and management of vascular access device: Secondary | ICD-10-CM

## 2024-04-04 DIAGNOSIS — Z86711 Personal history of pulmonary embolism: Secondary | ICD-10-CM | POA: Diagnosis not present

## 2024-04-04 DIAGNOSIS — C801 Malignant (primary) neoplasm, unspecified: Secondary | ICD-10-CM | POA: Diagnosis not present

## 2024-04-04 MED ORDER — PEGFILGRASTIM-CBQV 6 MG/0.6ML ~~LOC~~ SOSY
6.0000 mg | PREFILLED_SYRINGE | Freq: Once | SUBCUTANEOUS | Status: AC
  Administered 2024-04-04: 6 mg via SUBCUTANEOUS
  Filled 2024-04-04: qty 0.6

## 2024-04-04 NOTE — Patient Instructions (Signed)

## 2024-04-10 ENCOUNTER — Inpatient Hospital Stay

## 2024-04-10 ENCOUNTER — Encounter: Payer: Self-pay | Admitting: *Deleted

## 2024-04-10 ENCOUNTER — Telehealth: Payer: Self-pay | Admitting: *Deleted

## 2024-04-10 DIAGNOSIS — Z452 Encounter for adjustment and management of vascular access device: Secondary | ICD-10-CM

## 2024-04-10 NOTE — Telephone Encounter (Signed)
 Called and left VM that she wants to have her PICC line pulled today. Confirmed with infusion that it can be done today. She also wants to bring in forms for nurse to review.

## 2024-04-10 NOTE — Progress Notes (Signed)
 PICC Removal Note: S: Pt presented for removal of PICC per order per MD Sherrill O: PICC line removed from Right antecubital after sterile site prepped per protocol. PICC catheter tip visualized and intact. Pressure dressing applied with vasiline guaze and tape. A: No redness, ecchymosis, edema, swelling, or drainage noted at site. Pt remained supine for 30 minutes post PICC removal P: Instructions provided on post PICC discharge care, including followup notification instructions. VSS at discharge

## 2024-04-10 NOTE — Patient Instructions (Signed)
 PICC Removal, Adult, Care After The following information offers guidance on how to care for yourself after your procedure. Your health care provider may also give you more specific instructions. If you have problems or questions, contact your health care provider. What can I expect after the procedure? After the procedure, it is common to have: Tenderness or soreness. Redness, swelling, or a scab at the place where your PICC was removed (exit site). Follow these instructions at home: For the first 24 hours after the procedure: Keep the bandage (dressing) on your exit site clean and dry. Do not remove your dressing until your health care provider tells you to do so. Do not lift anything heavy or do activities that require great effort until your health care provider says it is okay. You should avoid: Lifting weights. Doing yard work. Doing any physical activity with repetitive arm movement. Watch closely for any signs of an air bubble in the vein (air embolism). This is a rare but serious complication. Signs of an air embolism include trouble breathing, wheezing, chest pain, or a fast pulse. If you have signs of an air embolism, call 911 right away and lie down on your left side to keep the air from moving into your lungs. After 24 hours have passed:  Remove your dressing as told by your health care provider. Wash your hands with soap and water for at least 20 seconds before and after you change your dressing. If soap and water are not available, use hand sanitizer. Return to your normal activities as told by your health care provider. A small scab may develop over the exit site. Do not pick at the scab. When bathing or showering, gently wash the exit site with soap and water. Pat it dry. Watch for signs of infection, such as: A fever or chills. Swollen glands under your arm. More redness, swelling, or soreness around your arm. Blood, fluid, or pus coming from your exit site. Warmth or a  bad smell coming from your exit site. A red streak spreading away from your exit site. General instructions Take over-the-counter and prescription medicines only as told by your health care provider. Do not take any new medicines without checking with your health care provider first. If you were given an antibiotic ointment, apply it as told by your health care provider. Keep all follow-up visits. This is important. Contact a health care provider if: You have a fever or chills. You have swelling at your exit site or swollen glands under your arm. You have signs of infection at your exit site. You have soreness, redness, or swelling in your arm that gets worse. Get help right away if: You have numbness or tingling in your fingers, hand, or arm. Your arm looks blue and feels cold. You have signs of an air embolism, such as trouble breathing, wheezing, chest pain, or a fast pulse. These symptoms may be an emergency. Get medical help right away. Call 911. Do not wait to see if the symptoms will go away. Do not drive yourself to the hospital. Summary After a PICC is removed, it is common to have tenderness or soreness, redness, swelling, or a scab at the exit site. Keep the bandage (dressing) over the exit site clean and dry. Do not remove the dressing until your health care provider tells you to do so. Do not lift anything heavy or do activities that require great effort until your health care provider says it is okay. Watch closely for any signs  of an air bubble (air embolism). If you have signs of an air embolism, call 911 right away and lie down on your left side. This information is not intended to replace advice given to you by your health care provider. Make sure you discuss any questions you have with your health care provider. Document Revised: 07/02/2021 Document Reviewed: 07/02/2021 Elsevier Patient Education  2024 [employer].

## 2024-04-17 ENCOUNTER — Encounter

## 2024-04-17 ENCOUNTER — Inpatient Hospital Stay

## 2024-04-17 ENCOUNTER — Ambulatory Visit (HOSPITAL_BASED_OUTPATIENT_CLINIC_OR_DEPARTMENT_OTHER)
Admission: RE | Admit: 2024-04-17 | Discharge: 2024-04-17 | Disposition: A | Discharge status: Home / Self Care | Source: Ambulatory Visit | Attending: Oncology | Admitting: Oncology

## 2024-04-17 DIAGNOSIS — R935 Abnormal findings on diagnostic imaging of other abdominal regions, including retroperitoneum: Secondary | ICD-10-CM | POA: Diagnosis not present

## 2024-04-17 DIAGNOSIS — D7389 Other diseases of spleen: Secondary | ICD-10-CM | POA: Diagnosis not present

## 2024-04-17 DIAGNOSIS — C786 Secondary malignant neoplasm of retroperitoneum and peritoneum: Secondary | ICD-10-CM | POA: Diagnosis not present

## 2024-04-17 DIAGNOSIS — K769 Liver disease, unspecified: Secondary | ICD-10-CM | POA: Diagnosis not present

## 2024-04-17 LAB — CBC WITH DIFFERENTIAL (CANCER CENTER ONLY)
Abs Immature Granulocytes: 0.68 10*3/uL — ABNORMAL HIGH (ref 0.00–0.07)
Basophils Absolute: 0.1 10*3/uL (ref 0.0–0.1)
Basophils Relative: 0 %
Eosinophils Absolute: 0.1 10*3/uL (ref 0.0–0.5)
Eosinophils Relative: 0 %
HCT: 34.6 % — ABNORMAL LOW (ref 36.0–46.0)
Hemoglobin: 11.2 g/dL — ABNORMAL LOW (ref 12.0–15.0)
Immature Granulocytes: 3 %
Lymphocytes Relative: 13 %
Lymphs Abs: 2.9 10*3/uL (ref 0.7–4.0)
MCH: 31.5 pg (ref 26.0–34.0)
MCHC: 32.4 g/dL (ref 30.0–36.0)
MCV: 97.2 fL (ref 80.0–100.0)
Monocytes Absolute: 1.1 10*3/uL — ABNORMAL HIGH (ref 0.1–1.0)
Monocytes Relative: 5 %
Neutro Abs: 16.9 10*3/uL — ABNORMAL HIGH (ref 1.7–7.7)
Neutrophils Relative %: 79 %
Platelet Count: 250 10*3/uL (ref 150–400)
RBC: 3.56 MIL/uL — ABNORMAL LOW (ref 3.87–5.11)
RDW: 18.4 % — ABNORMAL HIGH (ref 11.5–15.5)
WBC Count: 21.7 10*3/uL — ABNORMAL HIGH (ref 4.0–10.5)
nRBC: 0.1 % (ref 0.0–0.2)

## 2024-04-17 LAB — BASIC METABOLIC PANEL - CANCER CENTER ONLY
Anion gap: 7 (ref 5–15)
BUN: 21 mg/dL (ref 8–23)
CO2: 27 mmol/L (ref 22–32)
Calcium: 9 mg/dL (ref 8.9–10.3)
Chloride: 105 mmol/L (ref 98–111)
Creatinine: 0.99 mg/dL (ref 0.44–1.00)
GFR, Estimated: 56 mL/min — ABNORMAL LOW (ref >60)
Glucose, Bld: 137 mg/dL — ABNORMAL HIGH (ref 70–99)
Potassium: 3.9 mmol/L (ref 3.5–5.1)
Sodium: 139 mmol/L (ref 135–145)

## 2024-04-17 MED ORDER — IOHEXOL 300 MG/ML  SOLN
100.0000 mL | Freq: Once | INTRAMUSCULAR | Status: AC | PRN
  Administered 2024-04-17: 80 mL via INTRAVENOUS

## 2024-04-18 ENCOUNTER — Other Ambulatory Visit: Payer: Self-pay | Admitting: Oncology

## 2024-04-19 ENCOUNTER — Other Ambulatory Visit: Payer: Self-pay | Admitting: Oncology

## 2024-04-19 ENCOUNTER — Inpatient Hospital Stay (HOSPITAL_BASED_OUTPATIENT_CLINIC_OR_DEPARTMENT_OTHER): Admitting: Oncology

## 2024-04-19 ENCOUNTER — Other Ambulatory Visit (HOSPITAL_COMMUNITY): Payer: Self-pay

## 2024-04-19 VITALS — BP 147/74 | HR 83 | Temp 98.2°F | Resp 18 | Ht 64.0 in | Wt 120.0 lb

## 2024-04-19 DIAGNOSIS — C786 Secondary malignant neoplasm of retroperitoneum and peritoneum: Secondary | ICD-10-CM

## 2024-04-19 DIAGNOSIS — Z86711 Personal history of pulmonary embolism: Secondary | ICD-10-CM | POA: Diagnosis not present

## 2024-04-19 DIAGNOSIS — Z5111 Encounter for antineoplastic chemotherapy: Secondary | ICD-10-CM | POA: Diagnosis not present

## 2024-04-19 DIAGNOSIS — C787 Secondary malignant neoplasm of liver and intrahepatic bile duct: Secondary | ICD-10-CM | POA: Diagnosis not present

## 2024-04-19 DIAGNOSIS — Z7901 Long term (current) use of anticoagulants: Secondary | ICD-10-CM | POA: Diagnosis not present

## 2024-04-19 DIAGNOSIS — C801 Malignant (primary) neoplasm, unspecified: Secondary | ICD-10-CM | POA: Diagnosis not present

## 2024-04-19 DIAGNOSIS — Z79899 Other long term (current) drug therapy: Secondary | ICD-10-CM | POA: Diagnosis not present

## 2024-04-19 MED ORDER — DEXAMETHASONE 2 MG PO TABS
2.0000 mg | ORAL_TABLET | Freq: Every day | ORAL | 1 refills | Status: DC
  Filled 2024-04-19: qty 30, 30d supply, fill #0

## 2024-04-19 NOTE — Progress Notes (Signed)
 [facility] OFFICE PROGRESS NOTE   Diagnosis: Gynecologic malignancy  INTERVAL HISTORY:   Tracy Preston completed another cycle of paclitaxel /carboplatin  on 04/03/2024.  She received G-CSF on 04/04/2024.  No nausea/vomiting, mouth sores, diarrhea, or neuropathy symptoms.  She feels well.  Good appetite.  She is walking for exercise.  She has a daily bowel movement.  The abdomen remains distended.  Objective:  Vital signs in last 24 hours:  Blood pressure (!) 147/74, pulse 83, temperature 98.2 F (36.8 C), temperature source Temporal, resp. rate 18, height 5\' 4"  (1.626 m), weight 120 lb (54.4 kg), SpO2 98%.    HEENT: No thrush or ulcers Lymphatics: No axillary nodes Resp: Lungs clear bilaterally Cardio: Regular rate and rhythm GI: Distended, no mass, nontender Vascular: No leg edema   Lab Results:  Lab Results  Component Value Date   WBC 21.7 (H) 04/17/2024   HGB 11.2 (L) 04/17/2024   HCT 34.6 (L) 04/17/2024   MCV 97.2 04/17/2024   PLT 250 04/17/2024   NEUTROABS 16.9 (H) 04/17/2024    CMP  Lab Results  Component Value Date   NA 139 04/17/2024   K 3.9 04/17/2024   CL 105 04/17/2024   CO2 27 04/17/2024   GLUCOSE 137 (H) 04/17/2024   BUN 21 04/17/2024   CREATININE 0.99 04/17/2024   CALCIUM 9.0 04/17/2024   PROT 6.4 (L) 04/03/2024   ALBUMIN 3.5 04/03/2024   AST 13 (L) 04/03/2024   ALT 15 04/03/2024   ALKPHOS 85 04/03/2024   BILITOT 0.3 04/03/2024   GFRNONAA 56 (L) 04/17/2024    Lab Results  Component Value Date   CEA1 2.7 02/05/2024   QMV784 18 02/05/2024    Lab Results  Component Value Date   INR 1.0 02/25/2024   LABPROT 13.5 02/25/2024    Imaging:  CT ABDOMEN PELVIS W CONTRAST Result Date: 04/18/2024 CLINICAL DATA:  Restaging ovarian cancer on chemotherapy. * Tracking Code: BO * EXAM: CT ABDOMEN AND PELVIS WITH CONTRAST TECHNIQUE: Multidetector CT imaging of the abdomen and pelvis was performed using the standard protocol following bolus  administration of intravenous contrast. RADIATION DOSE REDUCTION: This exam was performed according to the departmental dose-optimization program which includes automated exposure control, adjustment of the mA and/or kV according to patient size and/or use of iterative reconstruction technique. CONTRAST:  80mL OMNIPAQUE  IOHEXOL  300 MG/ML  SOLN COMPARISON:  CT abdomen and pelvis dated 02/22/2024 and multiple priors FINDINGS: Lower chest: No focal consolidation or pulmonary nodule in the lung bases. Interval resolution of pleural effusions. Partially imaged heart size is normal. Hepatobiliary: Interval decrease in size of multifocal hepatic lesions, many of which are no longer discretely seen. Residual subtle 10 mm hypodensity in segment 8 (2:16), previously 14 mm and 5 mm hypodensity in segment 5 adjacent to the gallbladder fossa (2:23), previously 14 mm. Scattered subcentimeter hypoattenuating cysts are unchanged. No intra or extrahepatic biliary ductal dilation. Normal gallbladder. Pancreas: No focal lesions or main ductal dilation. Spleen: Normal in size. Interval decreased size of 3 mm hypodensity near the splenic hilum (2:14), previously 8 mm. Adrenals/Urinary Tract: No adrenal nodules. No suspicious renal mass, calculi or hydronephrosis. No focal bladder wall thickening. Stomach/Bowel: Normal appearance of the stomach. Previously noted colonic intussusception involving the rectosigmoid colon is no longer seen. Circumferential luminal narrowing of the sigmoid colon spanning 7.5 cm remains. There is similar tethered appearance of colon within the right lower quadrant and proximal sigmoid colon in the left lower quadrant. Appendix is not discretely seen.  Vascular/Lymphatic: Aortic atherosclerosis. No enlarged abdominal or pelvic lymph nodes. Reproductive: Decreased size of ill-defined cervical mass measuring 3.3 x 2.6 cm (2:58), previously 4.0 x 3.3 cm (remeasured). Interval decreased size of bilateral adnexal  masses measuring approximately 2.5 x 1.8 cm on the right (2:51), previously 4.0 x 2.6 cm and 2.1 x 1.4 cm on the left (2:52), previously 2.9 x 1.9 cm. Persistent distention of the endometrial canal. Similar peripherally calcified right uterine fundal mass, likely a leiomyoma. Other: Markedly decreased trace perisplenic and perihepatic ascites. Marked interval improvement in omental and mesenteric nodularity. 1.5 x 1.2 cm right lower quadrant serosal implant (2:37), previously 2.7 x 2.1 cm. More central area of nodularity measuring 2.3 x 1.1 cm (2:32), previously 3.8 x 2.3 cm. Musculoskeletal: No acute or abnormal lytic or blastic osseous lesions. IMPRESSION: 1. Marked interval improvement in metastatic disease in the abdomen and pelvis. 2. Interval decreased size of ill-defined cervical mass, bilateral adnexal masses, multifocal hepatic lesions, splenic lesion, and omental/mesenteric nodularity. 3. Markedly decreased trace perisplenic and perihepatic ascites. Resolution of pleural effusions. 4. Previously noted colonic intussusception involving the rectosigmoid colon is no longer seen. Circumferential luminal narrowing of the sigmoid colon spanning 7.5 cm remains. Similar tethered appearance of colon within the right lower quadrant and proximal sigmoid colon in the left lower quadrant. 5.  Aortic Atherosclerosis (ICD10-I70.0). Electronically Signed   By: Limin  Xu M.D.   On: 04/18/2024 19:38    Medications: I have reviewed the patient's current medications.   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 CT abdomen/pelvis 02/04/2024: Diffuse carcinomatosis and mild ascites, distal small bowel obstruction, masslike soft tissue prominence in the region of the lower urine segment/cervix, liver metastases, mild retroperitoneal lymphadenopathy, rectal intussusception Ultrasound pelvis 02/05/2024: No ovarian mass, 2 cm heterogenous area of the uterine fundus,  ascites Paracentesis 02/05/2024: Adenocarcinoma, cytokeratin 7, PAX8, and p53 positive, consistent with a gynecologic primary, ER positive; Foundation 1-HRD signature and microsatellite status cannot be determined, PIK3CA , no BRCA1 or BRCA2 alteration Guardant360 03/07/2024:PIK3CA, MSI high not detected Endometrial biopsy 02/07/2024: Negative for endometrial intraepithelial neoplasia and malignancy MRI pelvis 02/11/2024: Abnormal soft tissue at the uterine segment and cervix, diffuse carcinomatosis, right greater than left adnexal soft tissue fullness felt to represent ovarian metastases, sigmoid/rectal intussusception, small volume pelvic fluid, and's proved small bowel dilation CT chest 02/11/2024: Acute pulmonary embolism of the distal right pulmonary artery and lobar/segmental right upper lobe and right middle lobe pulmonary artery branches, small layering right pleural effusion, 1.1 cm left axillary lymph node, upper abdominal ascites with nodular soft tissue caking in the left upper peritoneum, hypodense liver lesions Cycle 1 Taxol /carboplatin  02/12/2024 Cycle 2 Taxol /carboplatin  03/06/2024 CT abdomen/pelvis 02/22/2024-persistent distal colonic intussusception, mild improvement in distal partial small bowel obstruction, colon decompressed, persistent ill-defined cervical bilateral adnexal masses, 1 hepatic metastasis slightly smaller, small right greater than left pleural effusions-new Cycle 3 Taxol /carboplatin  04/03/2024 CT abdomen/pelvis 04/17/2024: Decrease in cervical and bilateral adnexal masses, decreased hepatic lesions, decreased splenic lesion, and decreased omental/mesenteric nodularity, marked decrease in ascites with resolution of pleural effusions, colonic intussusception has resolved with persistent circumferential narrowing of the sigmoid colon with a tethered appearance of the colon in the right lower quadrant and proximal sigmoid in the left lower quadrant 2.  Small bowel obstruction secondary  to #1-NG tube placed discontinued 02/19/2024 Octreotide  02/23/2024 Sandostatin  LAR 03/07/2024 3.  Nutrition-TPN 4.  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  She has completed 3 cycles of Taxol /carboplatin .  She has tolerated the chemotherapy well.  The CA125 is lower and the restaging CT abdomen/pelvis is consistent with a response to therapy.  The abdomen remains distended with gas.  I reviewed the CT findings and images with Tracy Preston.  We discussed treatment options.  We discussed the expected prognosis with and without further chemotherapy.  She plans to discuss options with her family.  She will contact us  next week with her decision on further chemotherapy.  She is having bowel movements, but the abdomen remains distended.  There appears to be significant gaseous distention of the colon on the 04/17/2024 CT.  I will contact Dr. Orvil Bland to get her thoughts regarding persistent gaseous distention.  I suspect this is related to tumor involving the colon.  She will discontinue Sandostatin .  She continues Decadron .  I recommend weaning the Decadron  to off.  She would like to continue Decadron  the current dose for now.  Coni Deep, MD  04/19/2024  2:01 PM

## 2024-04-20 ENCOUNTER — Other Ambulatory Visit (HOSPITAL_COMMUNITY): Payer: Self-pay

## 2024-04-20 ENCOUNTER — Telehealth: Payer: Self-pay | Admitting: Gynecologic Oncology

## 2024-04-20 ENCOUNTER — Inpatient Hospital Stay (HOSPITAL_BASED_OUTPATIENT_CLINIC_OR_DEPARTMENT_OTHER): Admitting: Nurse Practitioner

## 2024-04-20 DIAGNOSIS — C787 Secondary malignant neoplasm of liver and intrahepatic bile duct: Secondary | ICD-10-CM

## 2024-04-20 DIAGNOSIS — K1230 Oral mucositis (ulcerative), unspecified: Secondary | ICD-10-CM

## 2024-04-20 MED ORDER — DEXAMETHASONE 2 MG PO TABS: 1.0000 mg | ORAL_TABLET | Freq: Every day | ORAL | Status: DC

## 2024-04-20 MED ORDER — NYSTATIN 100000 UNIT/ML MT SUSP: 5.0000 mL | Freq: Two times a day (BID) | OROMUCOSAL | 3 refills | Status: DC

## 2024-04-20 NOTE — Telephone Encounter (Signed)
 Called the patient.  Spoke with her and Jody.  Discussed her abdominal distention symptoms.  She endorses good appetite without nausea, emesis, or abdominal pain.  Having regular bowel function.  Discussed that dilation of small bowel is likely still related to some slow transit through her intestines related to malignant process and peritoneal disease.  She is going to be starting probiotics.  We also discussed possible trial of Reglan.  We reviewed recent CT findings and discussed implications.  The patient is trying to decide whether to stop or continue treatment.  She is overall tolerated chemotherapy very well with minimal side effects.  Patient and Irwin Manual aware that they can reach out at any point if they have questions or would like to talk.  Wiley Hanger MD Gynecologic Oncology

## 2024-04-21 ENCOUNTER — Other Ambulatory Visit (HOSPITAL_COMMUNITY): Payer: Self-pay

## 2024-04-28 ENCOUNTER — Other Ambulatory Visit (HOSPITAL_COMMUNITY): Payer: Self-pay

## 2024-04-28 ENCOUNTER — Other Ambulatory Visit: Payer: Self-pay | Admitting: Oncology

## 2024-04-29 ENCOUNTER — Other Ambulatory Visit: Payer: Self-pay | Admitting: Oncology

## 2024-05-02 ENCOUNTER — Inpatient Hospital Stay

## 2024-05-02 ENCOUNTER — Encounter: Payer: Self-pay | Admitting: Oncology

## 2024-05-02 ENCOUNTER — Other Ambulatory Visit: Payer: Self-pay | Admitting: *Deleted

## 2024-05-02 ENCOUNTER — Inpatient Hospital Stay: Attending: Oncology

## 2024-05-02 ENCOUNTER — Other Ambulatory Visit (HOSPITAL_COMMUNITY): Payer: Self-pay

## 2024-05-02 ENCOUNTER — Inpatient Hospital Stay (HOSPITAL_BASED_OUTPATIENT_CLINIC_OR_DEPARTMENT_OTHER): Admitting: Oncology

## 2024-05-02 VITALS — BP 135/75 | HR 67 | Temp 97.9°F | Resp 18 | Ht 64.0 in | Wt 121.2 lb

## 2024-05-02 VITALS — BP 143/64 | HR 70 | Temp 98.2°F | Resp 18

## 2024-05-02 DIAGNOSIS — Z5111 Encounter for antineoplastic chemotherapy: Secondary | ICD-10-CM | POA: Diagnosis not present

## 2024-05-02 DIAGNOSIS — R971 Elevated cancer antigen 125 [CA 125]: Secondary | ICD-10-CM | POA: Insufficient documentation

## 2024-05-02 DIAGNOSIS — C786 Secondary malignant neoplasm of retroperitoneum and peritoneum: Secondary | ICD-10-CM

## 2024-05-02 DIAGNOSIS — C787 Secondary malignant neoplasm of liver and intrahepatic bile duct: Secondary | ICD-10-CM | POA: Insufficient documentation

## 2024-05-02 DIAGNOSIS — Z79899 Other long term (current) drug therapy: Secondary | ICD-10-CM | POA: Insufficient documentation

## 2024-05-02 DIAGNOSIS — C801 Malignant (primary) neoplasm, unspecified: Secondary | ICD-10-CM | POA: Diagnosis not present

## 2024-05-02 DIAGNOSIS — E876 Hypokalemia: Secondary | ICD-10-CM

## 2024-05-02 DIAGNOSIS — K56609 Unspecified intestinal obstruction, unspecified as to partial versus complete obstruction: Secondary | ICD-10-CM

## 2024-05-02 LAB — CBC WITH DIFFERENTIAL (CANCER CENTER ONLY)
Abs Immature Granulocytes: 0.11 10*3/uL — ABNORMAL HIGH (ref 0.00–0.07)
Basophils Absolute: 0 10*3/uL (ref 0.0–0.1)
Basophils Relative: 0 %
Eosinophils Absolute: 0.2 10*3/uL (ref 0.0–0.5)
Eosinophils Relative: 1 %
HCT: 33.8 % — ABNORMAL LOW (ref 36.0–46.0)
Hemoglobin: 11.2 g/dL — ABNORMAL LOW (ref 12.0–15.0)
Immature Granulocytes: 1 %
Lymphocytes Relative: 11 %
Lymphs Abs: 2.1 10*3/uL (ref 0.7–4.0)
MCH: 32.7 pg (ref 26.0–34.0)
MCHC: 33.1 g/dL (ref 30.0–36.0)
MCV: 98.5 fL (ref 80.0–100.0)
Monocytes Absolute: 0.4 10*3/uL (ref 0.1–1.0)
Monocytes Relative: 2 %
Neutro Abs: 15.8 10*3/uL — ABNORMAL HIGH (ref 1.7–7.7)
Neutrophils Relative %: 85 %
Platelet Count: 229 10*3/uL (ref 150–400)
RBC: 3.43 MIL/uL — ABNORMAL LOW (ref 3.87–5.11)
RDW: 18.5 % — ABNORMAL HIGH (ref 11.5–15.5)
WBC Count: 18.6 10*3/uL — ABNORMAL HIGH (ref 4.0–10.5)
nRBC: 0 % (ref 0.0–0.2)

## 2024-05-02 LAB — CMP (CANCER CENTER ONLY)
ALT: 21 U/L (ref 0–44)
AST: 21 U/L (ref 15–41)
Albumin: 3.9 g/dL (ref 3.5–5.0)
Alkaline Phosphatase: 95 U/L (ref 38–126)
Anion gap: 13 (ref 5–15)
BUN: 23 mg/dL (ref 8–23)
CO2: 23 mmol/L (ref 22–32)
Calcium: 9.4 mg/dL (ref 8.9–10.3)
Chloride: 105 mmol/L (ref 98–111)
Creatinine: 0.93 mg/dL (ref 0.44–1.00)
GFR, Estimated: 60 mL/min — ABNORMAL LOW (ref >60)
Glucose, Bld: 168 mg/dL — ABNORMAL HIGH (ref 70–99)
Potassium: 2.8 mmol/L — ABNORMAL LOW (ref 3.5–5.1)
Sodium: 140 mmol/L (ref 135–145)
Total Bilirubin: 0.4 mg/dL (ref 0.0–1.2)
Total Protein: 6.5 g/dL (ref 6.5–8.1)

## 2024-05-02 LAB — MAGNESIUM: Magnesium: 1.7 mg/dL (ref 1.7–2.4)

## 2024-05-02 MED ORDER — DEXAMETHASONE SODIUM PHOSPHATE 10 MG/ML IJ SOLN
10.0000 mg | Freq: Once | INTRAMUSCULAR | Status: AC
  Administered 2024-05-02: 10 mg via INTRAVENOUS
  Filled 2024-05-02: qty 1

## 2024-05-02 MED ORDER — SODIUM CHLORIDE 0.9 % IV SOLN
135.0000 mg/m2 | Freq: Once | INTRAVENOUS | Status: AC
  Administered 2024-05-02: 210 mg via INTRAVENOUS
  Filled 2024-05-02: qty 35

## 2024-05-02 MED ORDER — POTASSIUM CHLORIDE CRYS ER 10 MEQ PO TBCR
10.0000 meq | EXTENDED_RELEASE_TABLET | Freq: Two times a day (BID) | ORAL | 1 refills | Status: DC
  Filled 2024-05-02: qty 60, 30d supply, fill #0
  Filled 2024-06-14: qty 60, 30d supply, fill #1

## 2024-05-02 MED ORDER — SODIUM CHLORIDE 0.9 % IV SOLN: INTRAVENOUS | Status: DC

## 2024-05-02 MED ORDER — FAMOTIDINE IN NACL 20-0.9 MG/50ML-% IV SOLN
20.0000 mg | Freq: Once | INTRAVENOUS | Status: AC
  Administered 2024-05-02: 20 mg via INTRAVENOUS
  Filled 2024-05-02: qty 50

## 2024-05-02 MED ORDER — FOSAPREPITANT DIMEGLUMINE INJECTION 150 MG
150.0000 mg | Freq: Once | INTRAVENOUS | Status: AC
  Administered 2024-05-02: 150 mg via INTRAVENOUS
  Filled 2024-05-02: qty 150

## 2024-05-02 MED ORDER — SODIUM CHLORIDE 0.9 % IV SOLN
297.0000 mg | Freq: Once | INTRAVENOUS | Status: AC
  Administered 2024-05-02: 300 mg via INTRAVENOUS
  Filled 2024-05-02: qty 30

## 2024-05-02 MED ORDER — OCTREOTIDE ACETATE 30 MG IM KIT
30.0000 mg | PACK | Freq: Once | INTRAMUSCULAR | Status: AC
  Administered 2024-05-02: 30 mg via INTRAMUSCULAR
  Filled 2024-05-02: qty 1

## 2024-05-02 MED ORDER — COLD PACK MISC ONCOLOGY: 1.0000 | Freq: Once | Status: DC | PRN

## 2024-05-02 MED ORDER — HEPARIN SOD (PORK) LOCK FLUSH 100 UNIT/ML IV SOLN: 500.0000 [IU] | Freq: Once | INTRAVENOUS | Status: DC | PRN

## 2024-05-02 MED ORDER — DEXAMETHASONE 1 MG PO TABS
1.0000 mg | ORAL_TABLET | Freq: Two times a day (BID) | ORAL | 0 refills | Status: DC
  Filled 2024-05-02: qty 90, 45d supply, fill #0

## 2024-05-02 MED ORDER — SODIUM CHLORIDE 0.9% FLUSH: 10.0000 mL | INTRAVENOUS | Status: DC | PRN

## 2024-05-02 MED ORDER — DIPHENHYDRAMINE HCL 50 MG/ML IJ SOLN
25.0000 mg | Freq: Once | INTRAMUSCULAR | Status: AC
  Administered 2024-05-02: 25 mg via INTRAVENOUS
  Filled 2024-05-02: qty 1

## 2024-05-02 MED ORDER — PALONOSETRON HCL INJECTION 0.25 MG/5ML
0.2500 mg | Freq: Once | INTRAVENOUS | Status: AC
  Administered 2024-05-02: 0.25 mg via INTRAVENOUS
  Filled 2024-05-02: qty 5

## 2024-05-02 NOTE — Progress Notes (Addendum)
 Patient presents today for chemotherapy infusion of Taxol  and Carboplatin . Patient also to get Sandostatin  injection today, verified with Kolleen Newsome [facility] that it can be given today with her infusion. Patient is in satisfactory condition with no new complaints voiced.  Vital signs are stable.  Labs reviewed by Dr. Coni Deep during the office visit and all labs are within treatment parameters.  We will proceed with treatment per MD orders.   Patient did choose to do ice therapy/cold pack with Taxol  infusion. Tolerated well.   Patient tolerated Sandostatin  injection in left upper buttock/gluteal with no complaints voiced.  Site clean and dry with no bruising or swelling noted.  No complaints of pain.    Patient tolerated treatment well with no complaints voiced.  Patient left ambulatory, with daughter, in stable condition.  Vital signs stable at discharge.  Follow up as scheduled.

## 2024-05-02 NOTE — Progress Notes (Signed)
 Patient seen by Dr. Coni Deep today  Vitals are within treatment parameters:Yes   Labs are within treatment parameters: No (Please specify and give further instructions.) K+ 2.8--may proceed  Treatment plan has been signed: Yes   Per physician team, Patient is ready for treatment. Please note the following modifications: Will d/c Neulasta  from care plan. Please give KCl 40 meq po while here today and start 20 meq daily. Please give her SandoLAR if OK per pharmacy (regarding insurance). Will recheck CBC and BMP on 05/11/24

## 2024-05-02 NOTE — Progress Notes (Signed)
 [name] OFFICE PROGRESS NOTE   Diagnosis: Gynecologic malignancy  INTERVAL HISTORY:   Tracy Preston returns as scheduled.  She generally feels well.  She reports mild nausea beginning yesterday evening and persisting today.  No emesis.  She is having bowel movements.  She continues to exercise.  She has a good appetite.  No other complaint.  She has decided to proceed with another cycle of chemotherapy.  Objective:  Vital signs in last 24 hours:  Blood pressure 135/75, pulse 67, temperature 97.9 F (36.6 C), temperature source Temporal, resp. rate 18, height 5\' 4"  (1.626 m), weight 121 lb 3.2 oz (55 kg), SpO2 98%.    HEENT: Mild white coat over the tongue Resp: End inspiratory rhonchi at the lower posterior chest bilaterally, no respiratory distress Cardio: Regular rate and rhythm GI: Distended, no mass, nontender Vascular: No leg edema   Lab Results:  Lab Results  Component Value Date   WBC 21.7 (H) 04/17/2024   HGB 11.2 (L) 04/17/2024   HCT 34.6 (L) 04/17/2024   MCV 97.2 04/17/2024   PLT 250 04/17/2024   NEUTROABS 16.9 (H) 04/17/2024    CMP  Lab Results  Component Value Date   NA 139 04/17/2024   K 3.9 04/17/2024   CL 105 04/17/2024   CO2 27 04/17/2024   GLUCOSE 137 (H) 04/17/2024   BUN 21 04/17/2024   CREATININE 0.99 04/17/2024   CALCIUM 9.0 04/17/2024   PROT 6.4 (L) 04/03/2024   ALBUMIN 3.5 04/03/2024   AST 13 (L) 04/03/2024   ALT 15 04/03/2024   ALKPHOS 85 04/03/2024   BILITOT 0.3 04/03/2024   GFRNONAA 56 (L) 04/17/2024    Lab Results  Component Value Date   CEA1 2.7 02/05/2024   WUJ811 18 02/05/2024     Medications: I have reviewed the patient's current medications.   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 CT abdomen/pelvis 02/04/2024: Diffuse carcinomatosis and mild ascites, distal small bowel obstruction, masslike soft tissue prominence in the region of the  lower urine segment/cervix, liver metastases, mild retroperitoneal lymphadenopathy, rectal intussusception Ultrasound pelvis 02/05/2024: No ovarian mass, 2 cm heterogenous area of the uterine fundus, ascites Paracentesis 02/05/2024: Adenocarcinoma, cytokeratin 7, PAX8, and p53 positive, consistent with a gynecologic primary, ER positive; Foundation 1-HRD signature and microsatellite status cannot be determined, PIK3CA , no BRCA1 or BRCA2 alteration [employer]ant360 03/07/2024:PIK3CA, MSI high not detected Endometrial biopsy 02/07/2024: Negative for endometrial intraepithelial neoplasia and malignancy MRI pelvis 02/11/2024: Abnormal soft tissue at the uterine segment and cervix, diffuse carcinomatosis, right greater than left adnexal soft tissue fullness felt to represent ovarian metastases, sigmoid/rectal intussusception, small volume pelvic fluid, and's proved small bowel dilation CT chest 02/11/2024: Acute pulmonary embolism of the distal right pulmonary artery and lobar/segmental right upper lobe and right middle lobe pulmonary artery branches, small layering right pleural effusion, 1.1 cm left axillary lymph node, upper abdominal ascites with nodular soft tissue caking in the left upper peritoneum, hypodense liver lesions Cycle 1 Taxol /carboplatin  02/12/2024 Cycle 2 Taxol /carboplatin  03/06/2024 CT abdomen/pelvis 02/22/2024-persistent distal colonic intussusception, mild improvement in distal partial small bowel obstruction, colon decompressed, persistent ill-defined cervical bilateral adnexal masses, 1 hepatic metastasis slightly smaller, small right greater than left pleural effusions-new Cycle 3 Taxol /carboplatin  04/03/2024 Cycle 4 Taxol /carboplatin  05/02/2024 CT abdomen/pelvis 04/17/2024: Decrease in cervical and bilateral adnexal masses, decreased hepatic lesions, decreased splenic lesion, and decreased omental/mesenteric nodularity, marked decrease in ascites with resolution of pleural effusions, colonic  intussusception has resolved with persistent  circumferential narrowing of the sigmoid colon with a tethered appearance of the colon in the right lower quadrant and proximal sigmoid in the left lower quadrant 2.  Small bowel obstruction secondary to #1-NG tube placed discontinued 02/19/2024 Octreotide  02/23/2024 Sandostatin  LAR 03/07/2024 3.  Nutrition-TPN 4.  Right sided pulmonary embolism noted on chest CT 02/11/2024: Heparin -transition to apixaban  02/26/2024 Lower extremity Dopplers 02/12/2024: Acute left peroneal and posterior tibial DVTs      Disposition: [name] appears stable.  She has mild nausea today and the abdomen remains distended.  The abdominal distention is likely related to ileus/partial obstruction from carcinomatosis.  We discussed treatment options.  She has responded to chemotherapy, but has persistent obstructive symptoms.  She would like to proceed with cycle 4 chemotherapy today.  She feels octreotide  helped her symptoms and would like to continue monthly Sandostatin .  She will receive Sandostatin  of 08/16/2024.  The white count is elevated today.  She will not receive G-CSF with this cycle.  She will call for a fever or symptoms of infection.  The potassium is low.  She will receive potassium at the cancer center today and begin a potassium supplement.  She will return for a CBC and chemistry panel next week.  She will continue Decadron .  [name] will call for increased nausea or new symptoms.  She will be scheduled for an office visit 05/16/2024.  Coni Deep, MD  05/02/2024  8:08 AM

## 2024-05-02 NOTE — Patient Instructions (Signed)
 [facility] A DEPT OF [place]  Discharge Instructions: Thank you for choosing [facility] to provide your oncology and hematology care.   If you have a lab appointment with the Cancer Center, please go directly to the Cancer Center and check in at the registration area.   Wear comfortable clothing and clothing appropriate for easy access to any Portacath or PICC line.   We strive to give you quality time with your provider. You may need to reschedule your appointment if you arrive late (15 or more minutes).  Arriving late affects you and other patients whose appointments are after yours.  Also, if you miss three or more appointments without notifying the office, you may be dismissed from the clinic at the provider's discretion.      For prescription refill requests, have your pharmacy contact our office and allow 72 hours for refills to be completed.    Today you received the following chemotherapy and/or immunotherapy agents Taxol , Carboplatin , and Sandostatin .  Paclitaxel  Injection What is this medication? PACLITAXEL  (PAK li TAX el) treats some types of cancer. It works by slowing down the growth of cancer cells. This medicine may be used for other purposes; ask your health care provider or pharmacist if you have questions. COMMON BRAND NAME(S): Onxol, Taxol  What should I tell my care team before I take this medication? They need to know if you have any of these conditions: Heart disease Liver disease Low white blood cell levels An unusual or allergic reaction to paclitaxel , other medications, foods, dyes, or preservatives If you or your partner are pregnant or trying to get pregnant Breast-feeding How should I use this medication? This medication is injected into a vein. It is given by your care team in a hospital or clinic setting. Talk to your care team about the use of this medication in children. While it may be given to children  for selected conditions, precautions do apply. Overdosage: If you think you have taken too much of this medicine contact a poison control center or emergency room at once. NOTE: This medicine is only for you. Do not share this medicine with others. What if I miss a dose? Keep appointments for follow-up doses. It is important not to miss your dose. Call your care team if you are unable to keep an appointment. What may interact with this medication? Do not take this medication with any of the following: Live virus vaccines Other medications may affect the way this medication works. Talk with your care team about all of the medications you take. They may suggest changes to your treatment plan to lower the risk of side effects and to make sure your medications work as intended. This list may not describe all possible interactions. Give your health care provider a list of all the medicines, herbs, non-prescription drugs, or dietary supplements you use. Also tell them if you smoke, drink alcohol, or use illegal drugs. Some items may interact with your medicine. What should I watch for while using this medication? Your condition will be monitored carefully while you are receiving this medication. You may need blood work while taking this medication. This medication may make you feel generally unwell. This is not uncommon as chemotherapy can affect healthy cells as well as cancer cells. Report any side effects. Continue your course of treatment even though you feel ill unless your care team tells you to stop. This medication can cause serious allergic reactions. To  reduce the risk, your care team may give you other medications to take before receiving this one. Be sure to follow the directions from your care team. This medication may increase your risk of getting an infection. Call your care team for advice if you get a fever, chills, sore throat, or other symptoms of a cold or flu. Do not treat yourself. Try  to avoid being around people who are sick. This medication may increase your risk to bruise or bleed. Call your care team if you notice any unusual bleeding. Be careful brushing or flossing your teeth or using a toothpick because you may get an infection or bleed more easily. If you have any dental work done, tell your dentist you are receiving this medication. Talk to your care team if you may be pregnant. Serious birth defects can occur if you take this medication during pregnancy. Talk to your care team before breastfeeding. Changes to your treatment plan may be needed. What side effects may I notice from receiving this medication? Side effects that you should report to your care team as soon as possible: Allergic reactions--skin rash, itching, hives, swelling of the face, lips, tongue, or throat Heart rhythm changes--fast or irregular heartbeat, dizziness, feeling faint or lightheaded, chest pain, trouble breathing Increase in blood pressure Infection--fever, chills, cough, sore throat, wounds that don't heal, pain or trouble when passing urine, general feeling of discomfort or being unwell Low blood pressure--dizziness, feeling faint or lightheaded, blurry vision Low red blood cell level--unusual weakness or fatigue, dizziness, headache, trouble breathing Painful swelling, warmth, or redness of the skin, blisters or sores at the infusion site Pain, tingling, or numbness in the hands or feet Slow heartbeat--dizziness, feeling faint or lightheaded, confusion, trouble breathing, unusual weakness or fatigue Unusual bruising or bleeding Side effects that usually do not require medical attention (report to your care team if they continue or are bothersome): Diarrhea Hair loss Joint pain Loss of appetite Muscle pain Nausea Vomiting This list may not describe all possible side effects. Call your doctor for medical advice about side effects. You may report side effects to FDA at  1-800-FDA-1088. Where should I keep my medication? This medication is given in a hospital or clinic. It will not be stored at home. NOTE: This sheet is a summary. It may not cover all possible information. If you have questions about this medicine, talk to your doctor, pharmacist, or health care provider.  2024 Elsevier/Gold Standard (2022-05-05 00:00:00)  Carboplatin  Injection What is this medication? CARBOPLATIN  (KAR boe pla tin) treats some types of cancer. It works by slowing down the growth of cancer cells. This medicine may be used for other purposes; ask your health care provider or pharmacist if you have questions. COMMON BRAND NAME(S): Paraplatin  What should I tell my care team before I take this medication? They need to know if you have any of these conditions: Blood disorders Hearing problems Kidney disease Recent or ongoing radiation therapy An unusual or allergic reaction to carboplatin , cisplatin, other medications, foods, dyes, or preservatives Pregnant or trying to get pregnant Breast-feeding How should I use this medication? This medication is injected into a vein. It is given by your care team in a hospital or clinic setting. Talk to your care team about the use of this medication in children. Special care may be needed. Overdosage: If you think you have taken too much of this medicine contact a poison control center or emergency room at once. NOTE: This medicine is only for  you. Do not share this medicine with others. What if I miss a dose? Keep appointments for follow-up doses. It is important not to miss your dose. Call your care team if you are unable to keep an appointment. What may interact with this medication? Medications for seizures Some antibiotics, such as amikacin, gentamicin, neomycin, streptomycin, tobramycin Vaccines This list may not describe all possible interactions. Give your health care provider a list of all the medicines, herbs,  non-prescription drugs, or dietary supplements you use. Also tell them if you smoke, drink alcohol, or use illegal drugs. Some items may interact with your medicine. What should I watch for while using this medication? Your condition will be monitored carefully while you are receiving this medication. You may need blood work while taking this medication. This medication may make you feel generally unwell. This is not uncommon, as chemotherapy can affect healthy cells as well as cancer cells. Report any side effects. Continue your course of treatment even though you feel ill unless your care team tells you to stop. In some cases, you may be given additional medications to help with side effects. Follow all directions for their use. This medication may increase your risk of getting an infection. Call your care team for advice if you get a fever, chills, sore throat, or other symptoms of a cold or flu. Do not treat yourself. Try to avoid being around people who are sick. Avoid taking medications that contain aspirin, acetaminophen , ibuprofen, naproxen, or ketoprofen unless instructed by your care team. These medications may hide a fever. Be careful brushing or flossing your teeth or using a toothpick because you may get an infection or bleed more easily. If you have any dental work done, tell your dentist you are receiving this medication. Talk to your care team if you wish to become pregnant or think you might be pregnant. This medication can cause serious birth defects. Talk to your care team about effective forms of contraception. Do not breast-feed while taking this medication. What side effects may I notice from receiving this medication? Side effects that you should report to your care team as soon as possible: Allergic reactions--skin rash, itching, hives, swelling of the face, lips, tongue, or throat Infection--fever, chills, cough, sore throat, wounds that don't heal, pain or trouble when passing  urine, general feeling of discomfort or being unwell Low red blood cell level--unusual weakness or fatigue, dizziness, headache, trouble breathing Pain, tingling, or numbness in the hands or feet, muscle weakness, change in vision, confusion or trouble speaking, loss of balance or coordination, trouble walking, seizures Unusual bruising or bleeding Side effects that usually do not require medical attention (report to your care team if they continue or are bothersome): Hair loss Nausea Unusual weakness or fatigue Vomiting This list may not describe all possible side effects. Call your doctor for medical advice about side effects. You may report side effects to FDA at 1-800-FDA-1088. Where should I keep my medication? This medication is given in a hospital or clinic. It will not be stored at home. NOTE: This sheet is a summary. It may not cover all possible information. If you have questions about this medicine, talk to your doctor, pharmacist, or health care provider.  2024 Elsevier/Gold Standard (2022-04-07 00:00:00)  Octreotide  Injection Solution What is this medication? OCTREOTIDE  (ok TREE oh tide) treats high levels of growth hormone (acromegaly). It works by reducing the amount of growth hormone your body makes. This reduces symptoms and the risk of health problems  caused by too much growth hormone, such as diabetes and heart disease. It may also be used to treat diarrhea caused by neuroendocrine tumors. It works by slowing down the release of serotonin from the tumor cells. This reduces the number of bowel movements you have. This medicine may be used for other purposes; ask your health care provider or pharmacist if you have questions. COMMON BRAND NAME(S): Bynfezia, Sandostatin  What should I tell my care team before I take this medication? They need to know if you have any of these conditions: Diabetes Gallbladder disease Heart disease Kidney disease Liver disease Pancreatic  disease Thyroid  disease An unusual or allergic reaction to octreotide , other medications, foods, dyes, or preservatives Pregnant or trying to get pregnant Breastfeeding How should I use this medication? This medication is injected under the skin or into a vein. It is usually given by your care team in a hospital or clinic setting. If you get this medication at home, you will be taught how to prepare and give it. Use exactly as directed. Take it as directed on the prescription label at the same time every day. Keep taking it unless your care team tells you to stop. Allow the injection solution to come to room temperature before use. Do not warm it artificially. It is important that you put your used needles and syringes in a special sharps container. Do not put them in a trash can. If you do not have a sharps container, call your pharmacist or care team to get one. Talk to your care team about the use of this medication in children. Special care may be needed. Overdosage: If you think you have taken too much of this medicine contact a poison control center or emergency room at once. NOTE: This medicine is only for you. Do not share this medicine with others. What if I miss a dose? If you miss a dose, take it as soon as you can. If it is almost time for your next dose, take only that dose. Do not take double or extra doses. What may interact with this medication? Bromocriptine Certain medications for blood pressure, heart disease, irregular heartbeat Cyclosporine Diuretics Medications for diabetes, including insulin  Quinidine This list may not describe all possible interactions. Give your health care provider a list of all the medicines, herbs, non-prescription drugs, or dietary supplements you use. Also tell them if you smoke, drink alcohol, or use illegal drugs. Some items may interact with your medicine. What should I watch for while using this medication? Visit your care team for regular  checks on your progress. Tell your care team if your symptoms do not start to get better or if they get worse. To help reduce irritation at the injection site, use a different site for each injection and make sure the solution is at room temperature before use. This medication may cause decreases in blood sugar. Signs of low blood sugar include chills, cool, pale skin or cold sweats, drowsiness, extreme hunger, fast heartbeat, headache, nausea, nervousness or anxiety, shakiness, trembling, unsteadiness, tiredness, or weakness. Contact your care team right away if you experience any of these symptoms. This medication may increase blood sugar. The risk may be higher in patients who already have diabetes. Ask your care team what you can do to lower your risk of diabetes while taking this medication. You should make sure you get enough vitamin B12 while you are taking this medication. Discuss the foods you eat and the vitamins you take with your care  team. What side effects may I notice from receiving this medication? Side effects that you should report to your care team as soon as possible: Allergic reactions--skin rash, itching, hives, swelling of the face, lips, tongue, or throat Gallbladder problems--severe stomach pain, nausea, vomiting, fever Heart rhythm changes--fast or irregular heartbeat, dizziness, feeling faint or lightheaded, chest pain, trouble breathing High blood sugar (hyperglycemia)--increased thirst or amount of urine, unusual weakness or fatigue, blurry vision Low blood sugar (hypoglycemia)--tremors or shaking, anxiety, sweating, cold or clammy skin, confusion, dizziness, rapid heartbeat Low thyroid  levels (hypothyroidism)--unusual weakness or fatigue, increased sensitivity to cold, constipation, hair loss, dry skin, weight gain, feelings of depression Low vitamin B12 level--pain, tingling, or numbness in the hands or feet, muscle weakness, dizziness, confusion, trouble  concentrating Oily or light-colored stools, diarrhea, bloating, weight loss Pancreatitis--severe stomach pain that spreads to your back or gets worse after eating or when touched, fever, nausea, vomiting Slow heartbeat--dizziness, feeling faint or lightheaded, confusion, trouble breathing, unusual weakness or fatigue Side effects that usually do not require medical attention (report these to your care team if they continue or are bothersome): Diarrhea Dizziness Headache Nausea Pain, redness, or irritation at injection site Stomach pain This list may not describe all possible side effects. Call your doctor for medical advice about side effects. You may report side effects to FDA at 1-800-FDA-1088. Where should I keep my medication? Keep out of the reach of children and pets. Store in the refrigerator. Protect from light. Allow to come to room temperature naturally. Do not use artificial heat. If protected from light, the injection may be stored between 20 and 30 degrees C (70 and 86 degrees F) for 14 days. After the initial use, throw away any unused portion of a multiple dose vial after 14 days. Get rid of any unused portions of the ampules after use. To get rid of medications that are no longer needed or have expired: Take the medication to a medication take-back program. Ask your pharmacy or law enforcement to find a location. If you cannot return the medication, ask your pharmacist or care team how to get rid of the medication safely. NOTE: This sheet is a summary. It may not cover all possible information. If you have questions about this medicine, talk to your doctor, pharmacist, or health care provider.  2024 Elsevier/Gold Standard (2023-11-26 00:00:00)   To help prevent nausea and vomiting after your treatment, we encourage you to take your nausea medication as directed.  BELOW ARE SYMPTOMS THAT SHOULD BE REPORTED IMMEDIATELY: *FEVER GREATER THAN 100.4 F (38 C) OR HIGHER *CHILLS OR  SWEATING *NAUSEA AND VOMITING THAT IS NOT CONTROLLED WITH YOUR NAUSEA MEDICATION *UNUSUAL SHORTNESS OF BREATH *UNUSUAL BRUISING OR BLEEDING *URINARY PROBLEMS (pain or burning when urinating, or frequent urination) *BOWEL PROBLEMS (unusual diarrhea, constipation, pain near the anus) TENDERNESS IN MOUTH AND THROAT WITH OR WITHOUT PRESENCE OF ULCERS (sore throat, sores in mouth, or a toothache) UNUSUAL RASH, SWELLING OR PAIN  UNUSUAL VAGINAL DISCHARGE OR ITCHING   Items with * indicate a potential emergency and should be followed up as soon as possible or go to the Emergency Department if any problems should occur.  Please show the CHEMOTHERAPY ALERT CARD or IMMUNOTHERAPY ALERT CARD at check-in to the Emergency Department and triage nurse.  Should you have questions after your visit or need to cancel or reschedule your appointment, please contact [facility] - A DEPT OF [facility]  Dept: [phone]  and follow  the prompts.  Office hours are 8:00 a.m. to 4:30 p.m. Monday - Friday. Please note that voicemails left after 4:00 p.m. may not be returned until the following business day.  We are closed weekends and major holidays. You have access to a nurse at all times for urgent questions. Please call the main number to the clinic Dept: [phone] and follow the prompts.   For any non-urgent questions, you may also contact your provider using MyChart. We now offer e-Visits for anyone 55 and older to request care online for non-urgent symptoms. For details visit [email or web address].   Also download the MyChart app! Go to the app store, search "MyChart", open the app, select [facility], and log in with your MyChart username and password.

## 2024-05-03 ENCOUNTER — Ambulatory Visit

## 2024-05-03 LAB — CA 125: Cancer Antigen (CA) 125: 58.2 U/mL — ABNORMAL HIGH (ref 0.0–38.1)

## 2024-05-05 ENCOUNTER — Telehealth: Payer: Self-pay | Admitting: *Deleted

## 2024-05-05 MED ORDER — CLOTRIMAZOLE 10 MG MT TROC: 10.0000 mg | Freq: Three times a day (TID) | OROMUCOSAL | 1 refills | Status: DC

## 2024-05-05 MED ORDER — FLUCONAZOLE 100 MG PO TABS
100.0000 mg | ORAL_TABLET | Freq: Every day | ORAL | 0 refills | Status: DC
Start: 2024-05-05 — End: 2024-07-05

## 2024-05-05 NOTE — Telephone Encounter (Signed)
 Reports progression of thrush and if it is OK to take a laxative for constipation? Per Dr. 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 OK for laxative of choice.

## 2024-05-08 ENCOUNTER — Other Ambulatory Visit (HOSPITAL_COMMUNITY): Payer: Self-pay

## 2024-05-11 ENCOUNTER — Inpatient Hospital Stay

## 2024-05-11 ENCOUNTER — Ambulatory Visit: Payer: Self-pay | Admitting: Oncology

## 2024-05-11 DIAGNOSIS — C801 Malignant (primary) neoplasm, unspecified: Secondary | ICD-10-CM | POA: Diagnosis not present

## 2024-05-11 DIAGNOSIS — C787 Secondary malignant neoplasm of liver and intrahepatic bile duct: Secondary | ICD-10-CM | POA: Diagnosis not present

## 2024-05-11 DIAGNOSIS — R971 Elevated cancer antigen 125 [CA 125]: Secondary | ICD-10-CM | POA: Diagnosis not present

## 2024-05-11 DIAGNOSIS — Z79899 Other long term (current) drug therapy: Secondary | ICD-10-CM | POA: Diagnosis not present

## 2024-05-11 DIAGNOSIS — Z5111 Encounter for antineoplastic chemotherapy: Secondary | ICD-10-CM | POA: Diagnosis not present

## 2024-05-11 LAB — CBC WITH DIFFERENTIAL (CANCER CENTER ONLY)
Abs Immature Granulocytes: 0 10*3/uL (ref 0.00–0.07)
Band Neutrophils: 3 %
Basophils Absolute: 0 10*3/uL (ref 0.0–0.1)
Basophils Relative: 0 %
Eosinophils Absolute: 0 10*3/uL (ref 0.0–0.5)
Eosinophils Relative: 0 %
HCT: 28.7 % — ABNORMAL LOW (ref 36.0–46.0)
Hemoglobin: 9.6 g/dL — ABNORMAL LOW (ref 12.0–15.0)
Lymphocytes Relative: 30 %
Lymphs Abs: 1.4 10*3/uL (ref 0.7–4.0)
MCH: 32.8 pg (ref 26.0–34.0)
MCHC: 33.4 g/dL (ref 30.0–36.0)
MCV: 98 fL (ref 80.0–100.0)
Monocytes Absolute: 0.2 10*3/uL (ref 0.1–1.0)
Monocytes Relative: 5 %
Neutro Abs: 3.1 10*3/uL (ref 1.7–7.7)
Neutrophils Relative %: 62 %
Platelet Count: 268 10*3/uL (ref 150–400)
RBC: 2.93 MIL/uL — ABNORMAL LOW (ref 3.87–5.11)
RDW: 16.3 % — ABNORMAL HIGH (ref 11.5–15.5)
WBC Count: 4.7 10*3/uL (ref 4.0–10.5)
WBC Morphology: ABNORMAL
nRBC: 0 % (ref 0.0–0.2)

## 2024-05-11 LAB — BASIC METABOLIC PANEL - CANCER CENTER ONLY
Anion gap: 12 (ref 5–15)
BUN: 39 mg/dL — ABNORMAL HIGH (ref 8–23)
CO2: 22 mmol/L (ref 22–32)
Calcium: 8.9 mg/dL (ref 8.9–10.3)
Chloride: 99 mmol/L (ref 98–111)
Creatinine: 1.35 mg/dL — ABNORMAL HIGH (ref 0.44–1.00)
GFR, Estimated: 38 mL/min — ABNORMAL LOW (ref >60)
Glucose, Bld: 154 mg/dL — ABNORMAL HIGH (ref 70–99)
Potassium: 5 mmol/L (ref 3.5–5.1)
Sodium: 133 mmol/L — ABNORMAL LOW (ref 135–145)

## 2024-05-12 ENCOUNTER — Telehealth: Payer: Self-pay | Admitting: *Deleted

## 2024-05-12 DIAGNOSIS — C787 Secondary malignant neoplasm of liver and intrahepatic bile duct: Secondary | ICD-10-CM

## 2024-05-12 NOTE — Telephone Encounter (Addendum)
 Tracy Preston called to inquire if she still needs lab on her 5/20 visit? Also wanted MD aware she is not going out of town, so asking if she can have her 6/3 appointments moved to 5/29 again since Dr. Scherrie Curt is in office that week? Per Dr. Scherrie Curt: OK to move 6/3 to 5/29 if she wishes. Spoke with daughter, and they will just keep as is for 6/3. Will be here on 5/20 as planned.

## 2024-05-16 ENCOUNTER — Encounter: Payer: Self-pay | Admitting: Nurse Practitioner

## 2024-05-16 ENCOUNTER — Inpatient Hospital Stay

## 2024-05-16 ENCOUNTER — Inpatient Hospital Stay (HOSPITAL_BASED_OUTPATIENT_CLINIC_OR_DEPARTMENT_OTHER): Admitting: Nurse Practitioner

## 2024-05-16 VITALS — BP 136/78 | HR 86 | Temp 98.1°F | Resp 18 | Ht 64.0 in | Wt 117.7 lb

## 2024-05-16 DIAGNOSIS — Z79899 Other long term (current) drug therapy: Secondary | ICD-10-CM | POA: Diagnosis not present

## 2024-05-16 DIAGNOSIS — C786 Secondary malignant neoplasm of retroperitoneum and peritoneum: Secondary | ICD-10-CM

## 2024-05-16 DIAGNOSIS — Z5111 Encounter for antineoplastic chemotherapy: Secondary | ICD-10-CM | POA: Diagnosis not present

## 2024-05-16 DIAGNOSIS — C801 Malignant (primary) neoplasm, unspecified: Secondary | ICD-10-CM | POA: Diagnosis not present

## 2024-05-16 DIAGNOSIS — C787 Secondary malignant neoplasm of liver and intrahepatic bile duct: Secondary | ICD-10-CM

## 2024-05-16 DIAGNOSIS — R971 Elevated cancer antigen 125 [CA 125]: Secondary | ICD-10-CM | POA: Diagnosis not present

## 2024-05-16 LAB — CBC WITH DIFFERENTIAL (CANCER CENTER ONLY)
Abs Immature Granulocytes: 0.17 10*3/uL — ABNORMAL HIGH (ref 0.00–0.07)
Basophils Absolute: 0 10*3/uL (ref 0.0–0.1)
Basophils Relative: 0 %
Eosinophils Absolute: 0 10*3/uL (ref 0.0–0.5)
Eosinophils Relative: 0 %
HCT: 29.5 % — ABNORMAL LOW (ref 36.0–46.0)
Hemoglobin: 9.7 g/dL — ABNORMAL LOW (ref 12.0–15.0)
Immature Granulocytes: 2 %
Lymphocytes Relative: 22 %
Lymphs Abs: 1.9 10*3/uL (ref 0.7–4.0)
MCH: 32.3 pg (ref 26.0–34.0)
MCHC: 32.9 g/dL (ref 30.0–36.0)
MCV: 98.3 fL (ref 80.0–100.0)
Monocytes Absolute: 0.5 10*3/uL (ref 0.1–1.0)
Monocytes Relative: 6 %
Neutro Abs: 5.9 10*3/uL (ref 1.7–7.7)
Neutrophils Relative %: 70 %
Platelet Count: 522 10*3/uL — ABNORMAL HIGH (ref 150–400)
RBC: 3 MIL/uL — ABNORMAL LOW (ref 3.87–5.11)
RDW: 15.9 % — ABNORMAL HIGH (ref 11.5–15.5)
WBC Count: 8.4 10*3/uL (ref 4.0–10.5)
nRBC: 0 % (ref 0.0–0.2)

## 2024-05-16 LAB — CMP (CANCER CENTER ONLY)
ALT: 111 U/L — ABNORMAL HIGH (ref 0–44)
AST: 58 U/L — ABNORMAL HIGH (ref 15–41)
Albumin: 3.4 g/dL — ABNORMAL LOW (ref 3.5–5.0)
Alkaline Phosphatase: 246 U/L — ABNORMAL HIGH (ref 38–126)
Anion gap: 13 (ref 5–15)
BUN: 27 mg/dL — ABNORMAL HIGH (ref 8–23)
CO2: 20 mmol/L — ABNORMAL LOW (ref 22–32)
Calcium: 9.4 mg/dL (ref 8.9–10.3)
Chloride: 101 mmol/L (ref 98–111)
Creatinine: 1.1 mg/dL — ABNORMAL HIGH (ref 0.44–1.00)
GFR, Estimated: 49 mL/min — ABNORMAL LOW (ref >60)
Glucose, Bld: 139 mg/dL — ABNORMAL HIGH (ref 70–99)
Potassium: 4.8 mmol/L (ref 3.5–5.1)
Sodium: 134 mmol/L — ABNORMAL LOW (ref 135–145)
Total Bilirubin: 0.2 mg/dL (ref 0.0–1.2)
Total Protein: 7.6 g/dL (ref 6.5–8.1)

## 2024-05-16 NOTE — Progress Notes (Signed)
 [facility] OFFICE PROGRESS NOTE   Diagnosis: Gynecologic malignancy  INTERVAL HISTORY:   Tracy Preston returns as scheduled.  She completed another cycle of Taxol /carboplatin  05/02/2024.  She received Sandostatin  05/02/2024.  She denies nausea/vomiting.  No mouth sores but she did develop "thrush".  She developed constipation a few days after treatment, relieved with senna.  She was fatigued for several days.  No change in abdominal distention.  No numbness or tingling in the hands or feet.  She walked 1.5 miles earlier today.  Following the walk her daughter noted some areas of red/purple discoloration on her feet.  Objective:  Vital signs in last 24 hours:  Blood pressure 136/78, pulse 86, temperature 98.1 F (36.7 C), temperature source Temporal, resp. rate 18, height 5\' 4"  (1.626 m), weight 117 lb 11.2 oz (53.4 kg), SpO2 100%.    HEENT: No thrush or ulcers. Resp: Inspiratory rhonchi lower lung fields bilaterally.  No respiratory distress. Cardio: Regular rate and rhythm. GI: Abdomen is mildly distended.  Nontender.  No mass. Vascular: No leg edema. Skin: A few small ? ecchymoses right medial foot and right and left dorsal aspect.    Lab Results:  Lab Results  Component Value Date   WBC 8.4 05/16/2024   HGB 9.7 (L) 05/16/2024   HCT 29.5 (L) 05/16/2024   MCV 98.3 05/16/2024   PLT 522 (H) 05/16/2024   NEUTROABS 5.9 05/16/2024    Imaging:  No results found.  Medications: I have reviewed the patient's current medications.  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 CT abdomen/pelvis 02/04/2024: Diffuse carcinomatosis and mild ascites, distal small bowel obstruction, masslike soft tissue prominence in the region of the lower urine segment/cervix, liver metastases, mild retroperitoneal lymphadenopathy, rectal intussusception Ultrasound pelvis 02/05/2024: No ovarian mass, 2 cm heterogenous area of the  uterine fundus, ascites Paracentesis 02/05/2024: Adenocarcinoma, cytokeratin 7, PAX8, and p53 positive, consistent with a gynecologic primary, ER positive; Foundation 1-HRD signature and microsatellite status cannot be determined, PIK3CA , no BRCA1 or BRCA2 alteration Guardant360 03/07/2024:PIK3CA, MSI high not detected Endometrial biopsy 02/07/2024: Negative for endometrial intraepithelial neoplasia and malignancy MRI pelvis 02/11/2024: Abnormal soft tissue at the uterine segment and cervix, diffuse carcinomatosis, right greater than left adnexal soft tissue fullness felt to represent ovarian metastases, sigmoid/rectal intussusception, small volume pelvic fluid, and's proved small bowel dilation CT chest 02/11/2024: Acute pulmonary embolism of the distal right pulmonary artery and lobar/segmental right upper lobe and right middle lobe pulmonary artery branches, small layering right pleural effusion, 1.1 cm left axillary lymph node, upper abdominal ascites with nodular soft tissue caking in the left upper peritoneum, hypodense liver lesions Cycle 1 Taxol /carboplatin  02/12/2024 Cycle 2 Taxol /carboplatin  03/06/2024 CT abdomen/pelvis 02/22/2024-persistent distal colonic intussusception, mild improvement in distal partial small bowel obstruction, colon decompressed, persistent ill-defined cervical bilateral adnexal masses, 1 hepatic metastasis slightly smaller, small right greater than left pleural effusions-new Cycle 3 Taxol /carboplatin  04/03/2024 CT abdomen/pelvis 04/17/2024: Decrease in cervical and bilateral adnexal masses, decreased hepatic lesions, decreased splenic lesion, and decreased omental/mesenteric nodularity, marked decrease in ascites with resolution of pleural effusions, colonic intussusception has resolved with persistent circumferential narrowing of the sigmoid colon with a tethered appearance of the colon in the right lower quadrant and proximal sigmoid in the left lower quadrant Cycle 4  Taxol /carboplatin  05/02/2024 2.  Small bowel obstruction secondary to #1-NG tube placed discontinued 02/19/2024 Octreotide  02/23/2024 Sandostatin  LAR 03/07/2024 3.  Nutrition-TPN 4.  Right sided pulmonary embolism noted on chest CT 02/11/2024: Heparin -transition  to apixaban  02/26/2024 Lower extremity Dopplers 02/12/2024: Acute left peroneal and posterior tibial DVTs  Disposition: Tracy Preston appears stable.  She has completed 4 cycles of Taxol /carboplatin .  Clinical status continues to be improved.  She is tolerating treatment well.  She is scheduled for the next cycle on 05/30/2024.  She is undecided as to if she will continue chemotherapy.  We reviewed the CBC and chemistry panel from today.  Creatinine is better but remains mildly elevated.  Transaminases with new mild elevation.  We will continue to monitor.  She will return for lab and follow-up on 05/24/2024.  She will contact the office in the interim with any problems.  Patient seen with Dr. Scherrie Curt.      Diana Forster ANP/GNP-BC   05/16/2024  1:50 PM  This was a shared visit with Diana Forster.  Tracy Preston was interviewed and examined.  She is now at day 15 following cycle 4 Taxol /carboplatin .  She had more malaise following the most recent cycle of chemotherapy.  The abdomen is less distended today.  The CA125 was lower on 05/02/2024.  The etiology of the liver enzyme elevation is unclear.  We will check a chemistry panel when she is here on 05/24/2024.  She is undecided on continuing chemotherapy.  Anise Kerns, MD

## 2024-05-23 ENCOUNTER — Other Ambulatory Visit: Payer: Self-pay | Admitting: Oncology

## 2024-05-24 ENCOUNTER — Other Ambulatory Visit: Payer: Self-pay

## 2024-05-24 ENCOUNTER — Other Ambulatory Visit: Payer: Self-pay | Admitting: Oncology

## 2024-05-24 ENCOUNTER — Inpatient Hospital Stay (HOSPITAL_BASED_OUTPATIENT_CLINIC_OR_DEPARTMENT_OTHER): Admitting: Oncology

## 2024-05-24 ENCOUNTER — Inpatient Hospital Stay

## 2024-05-24 ENCOUNTER — Other Ambulatory Visit (HOSPITAL_COMMUNITY): Payer: Self-pay

## 2024-05-24 ENCOUNTER — Other Ambulatory Visit (HOSPITAL_BASED_OUTPATIENT_CLINIC_OR_DEPARTMENT_OTHER): Payer: Self-pay

## 2024-05-24 VITALS — BP 140/66 | HR 95 | Temp 98.2°F | Resp 18 | Ht 64.0 in | Wt 117.5 lb

## 2024-05-24 DIAGNOSIS — C786 Secondary malignant neoplasm of retroperitoneum and peritoneum: Secondary | ICD-10-CM | POA: Diagnosis not present

## 2024-05-24 DIAGNOSIS — Z5111 Encounter for antineoplastic chemotherapy: Secondary | ICD-10-CM | POA: Diagnosis not present

## 2024-05-24 DIAGNOSIS — Z79899 Other long term (current) drug therapy: Secondary | ICD-10-CM | POA: Diagnosis not present

## 2024-05-24 DIAGNOSIS — R971 Elevated cancer antigen 125 [CA 125]: Secondary | ICD-10-CM | POA: Diagnosis not present

## 2024-05-24 DIAGNOSIS — C787 Secondary malignant neoplasm of liver and intrahepatic bile duct: Secondary | ICD-10-CM | POA: Diagnosis not present

## 2024-05-24 DIAGNOSIS — C801 Malignant (primary) neoplasm, unspecified: Secondary | ICD-10-CM | POA: Diagnosis not present

## 2024-05-24 LAB — CBC WITH DIFFERENTIAL (CANCER CENTER ONLY)
Abs Immature Granulocytes: 0.15 10*3/uL — ABNORMAL HIGH (ref 0.00–0.07)
Basophils Absolute: 0 10*3/uL (ref 0.0–0.1)
Basophils Relative: 0 %
Eosinophils Absolute: 0 10*3/uL (ref 0.0–0.5)
Eosinophils Relative: 0 %
HCT: 30 % — ABNORMAL LOW (ref 36.0–46.0)
Hemoglobin: 9.6 g/dL — ABNORMAL LOW (ref 12.0–15.0)
Immature Granulocytes: 1 %
Lymphocytes Relative: 19 %
Lymphs Abs: 2.6 10*3/uL (ref 0.7–4.0)
MCH: 31.9 pg (ref 26.0–34.0)
MCHC: 32 g/dL (ref 30.0–36.0)
MCV: 99.7 fL (ref 80.0–100.0)
Monocytes Absolute: 1.3 10*3/uL — ABNORMAL HIGH (ref 0.1–1.0)
Monocytes Relative: 10 %
Neutro Abs: 9.6 10*3/uL — ABNORMAL HIGH (ref 1.7–7.7)
Neutrophils Relative %: 70 %
Platelet Count: 449 10*3/uL — ABNORMAL HIGH (ref 150–400)
RBC: 3.01 MIL/uL — ABNORMAL LOW (ref 3.87–5.11)
RDW: 15.5 % (ref 11.5–15.5)
WBC Count: 13.8 10*3/uL — ABNORMAL HIGH (ref 4.0–10.5)
nRBC: 0 % (ref 0.0–0.2)

## 2024-05-24 LAB — CMP (CANCER CENTER ONLY)
ALT: 26 U/L (ref 0–44)
AST: 17 U/L (ref 15–41)
Albumin: 3.5 g/dL (ref 3.5–5.0)
Alkaline Phosphatase: 172 U/L — ABNORMAL HIGH (ref 38–126)
Anion gap: 13 (ref 5–15)
BUN: 24 mg/dL — ABNORMAL HIGH (ref 8–23)
CO2: 24 mmol/L (ref 22–32)
Calcium: 9.4 mg/dL (ref 8.9–10.3)
Chloride: 100 mmol/L (ref 98–111)
Creatinine: 1.33 mg/dL — ABNORMAL HIGH (ref 0.44–1.00)
GFR, Estimated: 39 mL/min — ABNORMAL LOW (ref >60)
Glucose, Bld: 183 mg/dL — ABNORMAL HIGH (ref 70–99)
Potassium: 4.4 mmol/L (ref 3.5–5.1)
Sodium: 137 mmol/L (ref 135–145)
Total Bilirubin: 0.3 mg/dL (ref 0.0–1.2)
Total Protein: 7.7 g/dL (ref 6.5–8.1)

## 2024-05-24 MED ORDER — APIXABAN 2.5 MG PO TABS
2.5000 mg | ORAL_TABLET | Freq: Two times a day (BID) | ORAL | 1 refills | Status: DC
  Filled 2024-05-24 (×2): qty 180, 90d supply, fill #0

## 2024-05-24 NOTE — Progress Notes (Signed)
 [facility] OFFICE PROGRESS NOTE   Diagnosis:.  Metastatic carcinoma  INTERVAL HISTORY:   Ms. Tracy Preston returns as scheduled.  No nausea.  She is having bowel movements.  Good appetite.  She has a bruise at the right lower leg.  No other bleeding.  She has decided to discontinue chemotherapy.  Objective:  Vital signs in last 24 hours:  Blood pressure (!) 140/66, pulse 95, temperature 98.2 F (36.8 C), temperature source Temporal, resp. rate 18, height 5\' 4"  (1.626 m), weight 117 lb 8 oz (53.3 kg), SpO2 100%.    HEENT: Mild white coating to tongue, 2 mm ulcer at the posterior right buccal mucosa Resp: Coarse rhonchi at the posterior base bilaterally, no respiratory distress Cardio: Regular rate and rhythm GI: Soft, mildly distended, no mass, no hepatosplenomegaly Vascular: No leg edema  Skin: Ecchymoses at the right greater than left pretibial area  Portacath/PICC-without erythema  Lab Results:  Lab Results  Component Value Date   WBC 13.8 (H) 05/24/2024   HGB 9.6 (L) 05/24/2024   HCT 30.0 (L) 05/24/2024   MCV 99.7 05/24/2024   PLT 449 (H) 05/24/2024   NEUTROABS 9.6 (H) 05/24/2024    CMP  Lab Results  Component Value Date   NA 134 (L) 05/16/2024   K 4.8 05/16/2024   CL 101 05/16/2024   CO2 20 (L) 05/16/2024   GLUCOSE 139 (H) 05/16/2024   BUN 27 (H) 05/16/2024   CREATININE 1.10 (H) 05/16/2024   CALCIUM 9.4 05/16/2024   PROT 7.6 05/16/2024   ALBUMIN 3.4 (L) 05/16/2024   AST 58 (H) 05/16/2024   ALT 111 (H) 05/16/2024   ALKPHOS 246 (H) 05/16/2024   BILITOT 0.2 05/16/2024   GFRNONAA 49 (L) 05/16/2024    Lab Results  Component Value Date   CEA1 2.7 02/05/2024   ZOX096 18 02/05/2024      Medications: I have reviewed the patient's current medications.   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 CT abdomen/pelvis 02/04/2024: Diffuse carcinomatosis and mild ascites, distal small bowel  obstruction, masslike soft tissue prominence in the region of the lower urine segment/cervix, liver metastases, mild retroperitoneal lymphadenopathy, rectal intussusception Ultrasound pelvis 02/05/2024: No ovarian mass, 2 cm heterogenous area of the uterine fundus, ascites Paracentesis 02/05/2024: Adenocarcinoma, cytokeratin 7, PAX8, and p53 positive, consistent with a gynecologic primary, ER positive; Foundation 1-HRD signature and microsatellite status cannot be determined, PIK3CA , no BRCA1 or BRCA2 alteration Guardant360 03/07/2024:PIK3CA, MSI high not detected Endometrial biopsy 02/07/2024: Negative for endometrial intraepithelial neoplasia and malignancy MRI pelvis 02/11/2024: Abnormal soft tissue at the uterine segment and cervix, diffuse carcinomatosis, right greater than left adnexal soft tissue fullness felt to represent ovarian metastases, sigmoid/rectal intussusception, small volume pelvic fluid, and's proved small bowel dilation CT chest 02/11/2024: Acute pulmonary embolism of the distal right pulmonary artery and lobar/segmental right upper lobe and right middle lobe pulmonary artery branches, small layering right pleural effusion, 1.1 cm left axillary lymph node, upper abdominal ascites with nodular soft tissue caking in the left upper peritoneum, hypodense liver lesions Cycle 1 Taxol /carboplatin  02/12/2024 Cycle 2 Taxol /carboplatin  03/06/2024 CT abdomen/pelvis 02/22/2024-persistent distal colonic intussusception, mild improvement in distal partial small bowel obstruction, colon decompressed, persistent ill-defined cervical bilateral adnexal masses, 1 hepatic metastasis slightly smaller, small right greater than left pleural effusions-new Cycle 3 Taxol /carboplatin  04/03/2024 CT abdomen/pelvis 04/17/2024: Decrease in cervical and bilateral adnexal masses, decreased hepatic lesions, decreased splenic lesion, and decreased omental/mesenteric nodularity, marked decrease in ascites with resolution of  pleural  effusions, colonic intussusception has resolved with persistent circumferential narrowing of the sigmoid colon with a tethered appearance of the colon in the right lower quadrant and proximal sigmoid in the left lower quadrant Cycle 4 Taxol /carboplatin  05/02/2024 2.  Small bowel obstruction secondary to #1-NG tube placed discontinued 02/19/2024 Octreotide  02/23/2024 Sandostatin  LAR 03/07/2024 3.  Nutrition-TPN 4.  Right sided pulmonary embolism noted on chest CT 02/11/2024: Heparin -transition to apixaban  02/26/2024 Lower extremity Dopplers 02/12/2024: Acute left peroneal and posterior tibial DVTs    Disposition: Ms. Tracy Preston has carcinomatosis from a gynecologic primary.  She has completed 4 cycles of Taxol /carboplatin .  Her clinical status has improved and the CA125 was lower again on 05/02/2024.  She had malaise following the most recent cycle of chemotherapy.  She has decided to discontinue chemotherapy.  Ms. Tracy Preston has developed renal insufficiency over the past month.  The etiology is unclear.  We will consider a renal ultrasound if the creatinine is higher when she is here next month.  Apixaban  will be dosed adjusted for renal insufficiency.  She would like to continue monthly Sandostatin .  She will return for Sandostatin  on 05/30/2024.  Ms. Tracy Preston will return for an office visit in 4 weeks.    Coni Deep, MD  05/24/2024  8:31 AM

## 2024-05-25 LAB — CA 125: Cancer Antigen (CA) 125: 58.3 U/mL — ABNORMAL HIGH (ref 0.0–38.1)

## 2024-05-27 ENCOUNTER — Other Ambulatory Visit (HOSPITAL_COMMUNITY): Payer: Self-pay

## 2024-05-30 ENCOUNTER — Ambulatory Visit: Admitting: Nurse Practitioner

## 2024-05-30 ENCOUNTER — Ambulatory Visit

## 2024-05-30 ENCOUNTER — Encounter: Payer: Self-pay | Admitting: Internal Medicine

## 2024-05-30 ENCOUNTER — Other Ambulatory Visit: Payer: Self-pay

## 2024-05-30 ENCOUNTER — Inpatient Hospital Stay: Attending: Oncology

## 2024-05-30 ENCOUNTER — Telehealth: Payer: Self-pay

## 2024-05-30 ENCOUNTER — Other Ambulatory Visit

## 2024-05-30 ENCOUNTER — Inpatient Hospital Stay

## 2024-05-30 VITALS — BP 144/66 | HR 75 | Temp 98.0°F | Resp 18

## 2024-05-30 DIAGNOSIS — C787 Secondary malignant neoplasm of liver and intrahepatic bile duct: Secondary | ICD-10-CM | POA: Insufficient documentation

## 2024-05-30 DIAGNOSIS — R300 Dysuria: Secondary | ICD-10-CM

## 2024-05-30 DIAGNOSIS — K56609 Unspecified intestinal obstruction, unspecified as to partial versus complete obstruction: Secondary | ICD-10-CM

## 2024-05-30 DIAGNOSIS — R971 Elevated cancer antigen 125 [CA 125]: Secondary | ICD-10-CM | POA: Insufficient documentation

## 2024-05-30 DIAGNOSIS — C801 Malignant (primary) neoplasm, unspecified: Secondary | ICD-10-CM | POA: Insufficient documentation

## 2024-05-30 DIAGNOSIS — Z79899 Other long term (current) drug therapy: Secondary | ICD-10-CM | POA: Insufficient documentation

## 2024-05-30 DIAGNOSIS — Z5111 Encounter for antineoplastic chemotherapy: Secondary | ICD-10-CM | POA: Diagnosis not present

## 2024-05-30 DIAGNOSIS — C786 Secondary malignant neoplasm of retroperitoneum and peritoneum: Secondary | ICD-10-CM

## 2024-05-30 LAB — CBC WITH DIFFERENTIAL (CANCER CENTER ONLY)
Abs Immature Granulocytes: 0.55 10*3/uL — ABNORMAL HIGH (ref 0.00–0.07)
Basophils Absolute: 0 10*3/uL (ref 0.0–0.1)
Basophils Relative: 0 %
Eosinophils Absolute: 0 10*3/uL (ref 0.0–0.5)
Eosinophils Relative: 0 %
HCT: 26.5 % — ABNORMAL LOW (ref 36.0–46.0)
Hemoglobin: 8.6 g/dL — ABNORMAL LOW (ref 12.0–15.0)
Immature Granulocytes: 3 %
Lymphocytes Relative: 8 %
Lymphs Abs: 1.5 10*3/uL (ref 0.7–4.0)
MCH: 31.3 pg (ref 26.0–34.0)
MCHC: 32.5 g/dL (ref 30.0–36.0)
MCV: 96.4 fL (ref 80.0–100.0)
Monocytes Absolute: 1.2 10*3/uL — ABNORMAL HIGH (ref 0.1–1.0)
Monocytes Relative: 6 %
Neutro Abs: 15.8 10*3/uL — ABNORMAL HIGH (ref 1.7–7.7)
Neutrophils Relative %: 83 %
Platelet Count: 395 10*3/uL (ref 150–400)
RBC: 2.75 MIL/uL — ABNORMAL LOW (ref 3.87–5.11)
RDW: 15.3 % (ref 11.5–15.5)
WBC Count: 19.1 10*3/uL — ABNORMAL HIGH (ref 4.0–10.5)
nRBC: 0 % (ref 0.0–0.2)

## 2024-05-30 LAB — URINALYSIS, COMPLETE (UACMP) WITH MICROSCOPIC
Bilirubin Urine: NEGATIVE
Glucose, UA: NEGATIVE mg/dL
Ketones, ur: NEGATIVE mg/dL
Nitrite: NEGATIVE
Protein, ur: 30 mg/dL — AB
Specific Gravity, Urine: 1.01 (ref 1.005–1.030)
Trans Epithel, UA: 2
WBC, UA: >50 WBC/hpf (ref 0–5)
pH: 6 (ref 5.0–8.0)

## 2024-05-30 MED ORDER — OCTREOTIDE ACETATE 30 MG IM KIT
30.0000 mg | PACK | Freq: Once | INTRAMUSCULAR | Status: AC
  Administered 2024-05-30: 30 mg via INTRAMUSCULAR
  Filled 2024-05-30: qty 1

## 2024-05-30 MED ORDER — NITROFURANTOIN MONOHYD MACRO 100 MG PO CAPS: 100.0000 mg | ORAL_CAPSULE | Freq: Two times a day (BID) | ORAL | 0 refills | Status: DC

## 2024-05-30 NOTE — Telephone Encounter (Signed)
 Pt called and made aware of additional lab appt. No further needs at this time.

## 2024-05-30 NOTE — Patient Instructions (Signed)
 Octreotide Injection Solution What is this medication? OCTREOTIDE (ok TREE oh tide) treats high levels of growth hormone (acromegaly). It works by reducing the amount of growth hormone your body makes. This reduces symptoms and the risk of health problems caused by too much growth hormone, such as diabetes and heart disease. It may also be used to treat diarrhea caused by neuroendocrine tumors. It works by slowing down the release of serotonin from the tumor cells. This reduces the number of bowel movements you have. This medicine may be used for other purposes; ask your health care provider or pharmacist if you have questions. COMMON BRAND NAME(S): Berline Lopes, Sandostatin What should I tell my care team before I take this medication? They need to know if you have any of these conditions: Diabetes Gallbladder disease Heart disease Kidney disease Liver disease Pancreatic disease Thyroid disease An unusual or allergic reaction to octreotide, other medications, foods, dyes, or preservatives Pregnant or trying to get pregnant Breastfeeding How should I use this medication? This medication is injected under the skin or into a vein. It is usually given by your care team in a hospital or clinic setting. If you get this medication at home, you will be taught how to prepare and give it. Use exactly as directed. Take it as directed on the prescription label at the same time every day. Keep taking it unless your care team tells you to stop. Allow the injection solution to come to room temperature before use. Do not warm it artificially. It is important that you put your used needles and syringes in a special sharps container. Do not put them in a trash can. If you do not have a sharps container, call your pharmacist or care team to get one. Talk to your care team about the use of this medication in children. Special care may be needed. Overdosage: If you think you have taken too much of this medicine  contact a poison control center or emergency room at once. NOTE: This medicine is only for you. Do not share this medicine with others. What if I miss a dose? If you miss a dose, take it as soon as you can. If it is almost time for your next dose, take only that dose. Do not take double or extra doses. What may interact with this medication? Bromocriptine Certain medications for blood pressure, heart disease, irregular heartbeat Cyclosporine Diuretics Medications for diabetes, including insulin Quinidine This list may not describe all possible interactions. Give your health care provider a list of all the medicines, herbs, non-prescription drugs, or dietary supplements you use. Also tell them if you smoke, drink alcohol, or use illegal drugs. Some items may interact with your medicine. What should I watch for while using this medication? Visit your care team for regular checks on your progress. Tell your care team if your symptoms do not start to get better or if they get worse. To help reduce irritation at the injection site, use a different site for each injection and make sure the solution is at room temperature before use. This medication may cause decreases in blood sugar. Signs of low blood sugar include chills, cool, pale skin or cold sweats, drowsiness, extreme hunger, fast heartbeat, headache, nausea, nervousness or anxiety, shakiness, trembling, unsteadiness, tiredness, or weakness. Contact your care team right away if you experience any of these symptoms. This medication may increase blood sugar. The risk may be higher in patients who already have diabetes. Ask your care team what you can  do to lower your risk of diabetes while taking this medication. You should make sure you get enough vitamin B12 while you are taking this medication. Discuss the foods you eat and the vitamins you take with your care team. What side effects may I notice from receiving this medication? Side effects that  you should report to your care team as soon as possible: Allergic reactions--skin rash, itching, hives, swelling of the face, lips, tongue, or throat Gallbladder problems--severe stomach pain, nausea, vomiting, fever Heart rhythm changes--fast or irregular heartbeat, dizziness, feeling faint or lightheaded, chest pain, trouble breathing High blood sugar (hyperglycemia)--increased thirst or amount of urine, unusual weakness or fatigue, blurry vision Low blood sugar (hypoglycemia)--tremors or shaking, anxiety, sweating, cold or clammy skin, confusion, dizziness, rapid heartbeat Low thyroid levels (hypothyroidism)--unusual weakness or fatigue, increased sensitivity to cold, constipation, hair loss, dry skin, weight gain, feelings of depression Low vitamin B12 level--pain, tingling, or numbness in the hands or feet, muscle weakness, dizziness, confusion, trouble concentrating Oily or light-colored stools, diarrhea, bloating, weight loss Pancreatitis--severe stomach pain that spreads to your back or gets worse after eating or when touched, fever, nausea, vomiting Slow heartbeat--dizziness, feeling faint or lightheaded, confusion, trouble breathing, unusual weakness or fatigue Side effects that usually do not require medical attention (report these to your care team if they continue or are bothersome): Diarrhea Dizziness Headache Nausea Pain, redness, or irritation at injection site Stomach pain This list may not describe all possible side effects. Call your doctor for medical advice about side effects. You may report side effects to FDA at 1-800-FDA-1088. Where should I keep my medication? Keep out of the reach of children and pets. Store in the refrigerator. Protect from light. Allow to come to room temperature naturally. Do not use artificial heat. If protected from light, the injection may be stored between 20 and 30 degrees C (70 and 86 degrees F) for 14 days. After the initial use, throw away  any unused portion of a multiple dose vial after 14 days. Get rid of any unused portions of the ampules after use. To get rid of medications that are no longer needed or have expired: Take the medication to a medication take-back program. Ask your pharmacy or law enforcement to find a location. If you cannot return the medication, ask your pharmacist or care team how to get rid of the medication safely. NOTE: This sheet is a summary. It may not cover all possible information. If you have questions about this medicine, talk to your doctor, pharmacist, or health care provider.  2024 Elsevier/Gold Standard (2023-11-26 00:00:00)

## 2024-05-31 LAB — CA 125: Cancer Antigen (CA) 125: 62.7 U/mL — ABNORMAL HIGH (ref 0.0–38.1)

## 2024-06-01 ENCOUNTER — Ambulatory Visit

## 2024-06-01 ENCOUNTER — Encounter: Payer: Self-pay | Admitting: Internal Medicine

## 2024-06-01 LAB — URINE CULTURE: Culture: >=100000 — AB

## 2024-06-01 MED ORDER — CRANBERRY-D MANNOSE 158-500 MG PO CAPS: 1.0000 | ORAL_CAPSULE | ORAL | 11 refills | Status: DC

## 2024-06-01 MED ORDER — SULFAMETHOXAZOLE-TRIMETHOPRIM 800-160 MG PO TABS: 1.0000 | ORAL_TABLET | Freq: Two times a day (BID) | ORAL | 0 refills | Status: DC

## 2024-06-06 ENCOUNTER — Other Ambulatory Visit: Payer: Self-pay

## 2024-06-09 ENCOUNTER — Encounter: Payer: Self-pay | Admitting: Oncology

## 2024-06-09 NOTE — Progress Notes (Signed)
 Erroneous.

## 2024-06-12 DIAGNOSIS — H353132 Nonexudative age-related macular degeneration, bilateral, intermediate dry stage: Secondary | ICD-10-CM | POA: Diagnosis not present

## 2024-06-12 DIAGNOSIS — Z961 Presence of intraocular lens: Secondary | ICD-10-CM | POA: Diagnosis not present

## 2024-06-12 DIAGNOSIS — H52203 Unspecified astigmatism, bilateral: Secondary | ICD-10-CM | POA: Diagnosis not present

## 2024-06-13 ENCOUNTER — Inpatient Hospital Stay

## 2024-06-13 ENCOUNTER — Other Ambulatory Visit: Payer: Self-pay

## 2024-06-13 DIAGNOSIS — C787 Secondary malignant neoplasm of liver and intrahepatic bile duct: Secondary | ICD-10-CM | POA: Diagnosis not present

## 2024-06-13 DIAGNOSIS — C786 Secondary malignant neoplasm of retroperitoneum and peritoneum: Secondary | ICD-10-CM

## 2024-06-13 DIAGNOSIS — Z5111 Encounter for antineoplastic chemotherapy: Secondary | ICD-10-CM | POA: Diagnosis not present

## 2024-06-13 DIAGNOSIS — Z515 Encounter for palliative care: Secondary | ICD-10-CM

## 2024-06-13 DIAGNOSIS — Z79899 Other long term (current) drug therapy: Secondary | ICD-10-CM | POA: Diagnosis not present

## 2024-06-13 DIAGNOSIS — R300 Dysuria: Secondary | ICD-10-CM

## 2024-06-13 DIAGNOSIS — R971 Elevated cancer antigen 125 [CA 125]: Secondary | ICD-10-CM | POA: Diagnosis not present

## 2024-06-13 DIAGNOSIS — C801 Malignant (primary) neoplasm, unspecified: Secondary | ICD-10-CM | POA: Diagnosis not present

## 2024-06-13 LAB — URINALYSIS, COMPLETE (UACMP) WITH MICROSCOPIC
Bacteria, UA: NONE SEEN
Bilirubin Urine: NEGATIVE
Glucose, UA: NEGATIVE mg/dL
Hgb urine dipstick: NEGATIVE
Ketones, ur: NEGATIVE mg/dL
Leukocytes,Ua: NEGATIVE
Nitrite: NEGATIVE
Protein, ur: NEGATIVE mg/dL
Specific Gravity, Urine: 1.005 (ref 1.005–1.030)
pH: 5 (ref 5.0–8.0)

## 2024-06-13 NOTE — Progress Notes (Signed)
 Orders placed per Dr.Golding, pt confirmed appt.

## 2024-06-14 ENCOUNTER — Other Ambulatory Visit (HOSPITAL_COMMUNITY): Payer: Self-pay

## 2024-06-14 LAB — URINE CULTURE: Culture: NO GROWTH

## 2024-06-16 ENCOUNTER — Encounter: Payer: Self-pay | Admitting: Nurse Practitioner

## 2024-06-16 DIAGNOSIS — K1230 Oral mucositis (ulcerative), unspecified: Secondary | ICD-10-CM

## 2024-06-16 DIAGNOSIS — C787 Secondary malignant neoplasm of liver and intrahepatic bile duct: Secondary | ICD-10-CM

## 2024-06-16 MED ORDER — NYSTATIN 100000 UNIT/ML MT SUSP: 5.0000 mL | Freq: Two times a day (BID) | OROMUCOSAL | 3 refills | Status: DC

## 2024-06-21 ENCOUNTER — Encounter: Payer: Self-pay | Admitting: Nurse Practitioner

## 2024-06-21 ENCOUNTER — Inpatient Hospital Stay (HOSPITAL_BASED_OUTPATIENT_CLINIC_OR_DEPARTMENT_OTHER): Admitting: Nurse Practitioner

## 2024-06-21 ENCOUNTER — Inpatient Hospital Stay

## 2024-06-21 VITALS — BP 151/73 | HR 91 | Temp 98.2°F | Resp 18 | Ht 64.0 in | Wt 117.0 lb

## 2024-06-21 DIAGNOSIS — C801 Malignant (primary) neoplasm, unspecified: Secondary | ICD-10-CM | POA: Diagnosis not present

## 2024-06-21 DIAGNOSIS — R971 Elevated cancer antigen 125 [CA 125]: Secondary | ICD-10-CM | POA: Diagnosis not present

## 2024-06-21 DIAGNOSIS — C786 Secondary malignant neoplasm of retroperitoneum and peritoneum: Secondary | ICD-10-CM

## 2024-06-21 DIAGNOSIS — C787 Secondary malignant neoplasm of liver and intrahepatic bile duct: Secondary | ICD-10-CM | POA: Diagnosis not present

## 2024-06-21 DIAGNOSIS — Z79899 Other long term (current) drug therapy: Secondary | ICD-10-CM | POA: Diagnosis not present

## 2024-06-21 DIAGNOSIS — Z5111 Encounter for antineoplastic chemotherapy: Secondary | ICD-10-CM | POA: Diagnosis not present

## 2024-06-21 LAB — CBC WITH DIFFERENTIAL (CANCER CENTER ONLY)
Abs Immature Granulocytes: 0.21 10*3/uL — ABNORMAL HIGH (ref 0.00–0.07)
Basophils Absolute: 0 10*3/uL (ref 0.0–0.1)
Basophils Relative: 0 %
Eosinophils Absolute: 0.1 10*3/uL (ref 0.0–0.5)
Eosinophils Relative: 0 %
HCT: 26.7 % — ABNORMAL LOW (ref 36.0–46.0)
Hemoglobin: 8.5 g/dL — ABNORMAL LOW (ref 12.0–15.0)
Immature Granulocytes: 2 %
Lymphocytes Relative: 18 %
Lymphs Abs: 2.5 10*3/uL (ref 0.7–4.0)
MCH: 29.8 pg (ref 26.0–34.0)
MCHC: 31.8 g/dL (ref 30.0–36.0)
MCV: 93.7 fL (ref 80.0–100.0)
Monocytes Absolute: 1.2 10*3/uL — ABNORMAL HIGH (ref 0.1–1.0)
Monocytes Relative: 8 %
Neutro Abs: 10.1 10*3/uL — ABNORMAL HIGH (ref 1.7–7.7)
Neutrophils Relative %: 72 %
Platelet Count: 529 10*3/uL — ABNORMAL HIGH (ref 150–400)
RBC: 2.85 MIL/uL — ABNORMAL LOW (ref 3.87–5.11)
RDW: 15.7 % — ABNORMAL HIGH (ref 11.5–15.5)
WBC Count: 14.1 10*3/uL — ABNORMAL HIGH (ref 4.0–10.5)
nRBC: 0 % (ref 0.0–0.2)

## 2024-06-21 LAB — CMP (CANCER CENTER ONLY)
ALT: 54 U/L — ABNORMAL HIGH (ref 0–44)
AST: 32 U/L (ref 15–41)
Albumin: 3 g/dL — ABNORMAL LOW (ref 3.5–5.0)
Alkaline Phosphatase: 257 U/L — ABNORMAL HIGH (ref 38–126)
Anion gap: 10 (ref 5–15)
BUN: 30 mg/dL — ABNORMAL HIGH (ref 8–23)
CO2: 24 mmol/L (ref 22–32)
Calcium: 8.9 mg/dL (ref 8.9–10.3)
Chloride: 98 mmol/L (ref 98–111)
Creatinine: 1.52 mg/dL — ABNORMAL HIGH (ref 0.44–1.00)
GFR, Estimated: 33 mL/min — ABNORMAL LOW (ref >60)
Glucose, Bld: 159 mg/dL — ABNORMAL HIGH (ref 70–99)
Potassium: 4.5 mmol/L (ref 3.5–5.1)
Sodium: 132 mmol/L — ABNORMAL LOW (ref 135–145)
Total Bilirubin: <0.2 mg/dL (ref 0.0–1.2)
Total Protein: 7.4 g/dL (ref 6.5–8.1)

## 2024-06-21 NOTE — Progress Notes (Signed)
 [facility] OFFICE PROGRESS NOTE   Diagnosis: Metastatic carcinoma  INTERVAL HISTORY:   Tracy Preston returns as scheduled.  Energy level is slowly declining.  She reports being treated for a urinary tract infection since last office visit.  No abdominal pain.  Intermittent abdominal distention, few episodes of constipation.  She is able to relieve the constipation with a stool softener and laxative.  No nausea or vomiting.  Appetite stable.  No bleeding.  Objective:  Vital signs in last 24 hours:  Blood pressure (!) 151/73, pulse 91, temperature 98.2 F (36.8 C), resp. rate 18, height 5' 4 (1.626 m), weight 117 lb (53.1 kg), SpO2 98%.    HEENT: No thrush. Resp: Faint rales both lung bases.  No respiratory distress. Cardio: Regular rate and rhythm. GI: Abdomen is mildly distended.  Nontender.  No mass.  No hepatosplenomegaly. Vascular: No leg edema.    Lab Results:  Lab Results  Component Value Date   WBC 14.1 (H) 06/21/2024   HGB 8.5 (L) 06/21/2024   HCT 26.7 (L) 06/21/2024   MCV 93.7 06/21/2024   PLT 529 (H) 06/21/2024   NEUTROABS 10.1 (H) 06/21/2024    Imaging:  No results found.  Medications: I have reviewed the patient's current medications.  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 CT abdomen/pelvis 02/04/2024: Diffuse carcinomatosis and mild ascites, distal small bowel obstruction, masslike soft tissue prominence in the region of the lower urine segment/cervix, liver metastases, mild retroperitoneal lymphadenopathy, rectal intussusception Ultrasound pelvis 02/05/2024: No ovarian mass, 2 cm heterogenous area of the uterine fundus, ascites Paracentesis 02/05/2024: Adenocarcinoma, cytokeratin 7, PAX8, and p53 positive, consistent with a gynecologic primary, ER positive; Foundation 1-HRD signature and microsatellite status cannot be determined, PIK3CA , no BRCA1 or BRCA2 alteration Guardant360  03/07/2024:PIK3CA, MSI high not detected Endometrial biopsy 02/07/2024: Negative for endometrial intraepithelial neoplasia and malignancy MRI pelvis 02/11/2024: Abnormal soft tissue at the uterine segment and cervix, diffuse carcinomatosis, right greater than left adnexal soft tissue fullness felt to represent ovarian metastases, sigmoid/rectal intussusception, small volume pelvic fluid, and's proved small bowel dilation CT chest 02/11/2024: Acute pulmonary embolism of the distal right pulmonary artery and lobar/segmental right upper lobe and right middle lobe pulmonary artery branches, small layering right pleural effusion, 1.1 cm left axillary lymph node, upper abdominal ascites with nodular soft tissue caking in the left upper peritoneum, hypodense liver lesions Cycle 1 Taxol /carboplatin  02/12/2024 Cycle 2 Taxol /carboplatin  03/06/2024 CT abdomen/pelvis 02/22/2024-persistent distal colonic intussusception, mild improvement in distal partial small bowel obstruction, colon decompressed, persistent ill-defined cervical bilateral adnexal masses, 1 hepatic metastasis slightly smaller, small right greater than left pleural effusions-new Cycle 3 Taxol /carboplatin  04/03/2024 CT abdomen/pelvis 04/17/2024: Decrease in cervical and bilateral adnexal masses, decreased hepatic lesions, decreased splenic lesion, and decreased omental/mesenteric nodularity, marked decrease in ascites with resolution of pleural effusions, colonic intussusception has resolved with persistent circumferential narrowing of the sigmoid colon with a tethered appearance of the colon in the right lower quadrant and proximal sigmoid in the left lower quadrant Cycle 4 Taxol /carboplatin  05/02/2024 2.  Small bowel obstruction secondary to #1-NG tube placed discontinued 02/19/2024 Octreotide  02/23/2024 Sandostatin  LAR 03/07/2024 3.  Nutrition-TPN 4.  Right sided pulmonary embolism noted on chest CT 02/11/2024: Heparin -transition to apixaban  02/26/2024 Lower  extremity Dopplers 02/12/2024: Acute left peroneal and posterior tibial DVTs      Disposition: Tracy Preston appears stable.  She has carcinomatosis from a gynecologic primary.  She decided to discontinue chemotherapy after completing 4 cycles of  Taxol /carboplatin .  She is being followed with observation.  We reviewed the labs from today.  Creatinine is slightly higher.  At present she does not want to evaluate this further.  She would like to continue monthly Sandostatin .  She will return for a Sandostatin  injection next week.  Tracy Preston will return for an office visit in approximately 5 weeks.  We are available to see her sooner if needed.  Patient seen with Dr. Cloretta.    Olam Ned ANP/GNP-BC   06/21/2024  9:46 AM  This was a shared visit with Olam Ned.  Tracy Preston was interviewed and examined.  Her overall status appears unchanged, though the abdomen is more distended today.  The creatinine is higher.  We discussed the possibility of obstructive nephropathy.  She does not wish to undergo evaluation of the elevated creatinine.  She reconfirmed her decision to not receive further chemotherapy.  She would like to continue Sandostatin .  I was present for greater than 50% of today's visit.  I performed medical decision making.  Arvella Cloretta, MD

## 2024-06-22 ENCOUNTER — Other Ambulatory Visit (HOSPITAL_COMMUNITY): Payer: Self-pay

## 2024-06-28 ENCOUNTER — Inpatient Hospital Stay: Attending: Oncology

## 2024-06-28 VITALS — BP 115/65 | HR 77 | Temp 98.1°F | Resp 16

## 2024-06-28 DIAGNOSIS — C801 Malignant (primary) neoplasm, unspecified: Secondary | ICD-10-CM | POA: Diagnosis not present

## 2024-06-28 DIAGNOSIS — C787 Secondary malignant neoplasm of liver and intrahepatic bile duct: Secondary | ICD-10-CM | POA: Diagnosis not present

## 2024-06-28 DIAGNOSIS — K56609 Unspecified intestinal obstruction, unspecified as to partial versus complete obstruction: Secondary | ICD-10-CM | POA: Insufficient documentation

## 2024-06-28 MED ORDER — OCTREOTIDE ACETATE 30 MG IM KIT
30.0000 mg | PACK | Freq: Once | INTRAMUSCULAR | Status: AC
  Administered 2024-06-28: 30 mg via INTRAMUSCULAR
  Filled 2024-06-28: qty 1

## 2024-06-28 NOTE — Patient Instructions (Signed)
 Octreotide Injection Solution What is this medication? OCTREOTIDE (ok TREE oh tide) treats high levels of growth hormone (acromegaly). It works by reducing the amount of growth hormone your body makes. This reduces symptoms and the risk of health problems caused by too much growth hormone, such as diabetes and heart disease. It may also be used to treat diarrhea caused by neuroendocrine tumors. It works by slowing down the release of serotonin from the tumor cells. This reduces the number of bowel movements you have. This medicine may be used for other purposes; ask your health care provider or pharmacist if you have questions. COMMON BRAND NAME(S): Berline Lopes, Sandostatin What should I tell my care team before I take this medication? They need to know if you have any of these conditions: Diabetes Gallbladder disease Heart disease Kidney disease Liver disease Pancreatic disease Thyroid disease An unusual or allergic reaction to octreotide, other medications, foods, dyes, or preservatives Pregnant or trying to get pregnant Breastfeeding How should I use this medication? This medication is injected under the skin or into a vein. It is usually given by your care team in a hospital or clinic setting. If you get this medication at home, you will be taught how to prepare and give it. Use exactly as directed. Take it as directed on the prescription label at the same time every day. Keep taking it unless your care team tells you to stop. Allow the injection solution to come to room temperature before use. Do not warm it artificially. It is important that you put your used needles and syringes in a special sharps container. Do not put them in a trash can. If you do not have a sharps container, call your pharmacist or care team to get one. Talk to your care team about the use of this medication in children. Special care may be needed. Overdosage: If you think you have taken too much of this medicine  contact a poison control center or emergency room at once. NOTE: This medicine is only for you. Do not share this medicine with others. What if I miss a dose? If you miss a dose, take it as soon as you can. If it is almost time for your next dose, take only that dose. Do not take double or extra doses. What may interact with this medication? Bromocriptine Certain medications for blood pressure, heart disease, irregular heartbeat Cyclosporine Diuretics Medications for diabetes, including insulin Quinidine This list may not describe all possible interactions. Give your health care provider a list of all the medicines, herbs, non-prescription drugs, or dietary supplements you use. Also tell them if you smoke, drink alcohol, or use illegal drugs. Some items may interact with your medicine. What should I watch for while using this medication? Visit your care team for regular checks on your progress. Tell your care team if your symptoms do not start to get better or if they get worse. To help reduce irritation at the injection site, use a different site for each injection and make sure the solution is at room temperature before use. This medication may cause decreases in blood sugar. Signs of low blood sugar include chills, cool, pale skin or cold sweats, drowsiness, extreme hunger, fast heartbeat, headache, nausea, nervousness or anxiety, shakiness, trembling, unsteadiness, tiredness, or weakness. Contact your care team right away if you experience any of these symptoms. This medication may increase blood sugar. The risk may be higher in patients who already have diabetes. Ask your care team what you can  do to lower your risk of diabetes while taking this medication. You should make sure you get enough vitamin B12 while you are taking this medication. Discuss the foods you eat and the vitamins you take with your care team. What side effects may I notice from receiving this medication? Side effects that  you should report to your care team as soon as possible: Allergic reactions--skin rash, itching, hives, swelling of the face, lips, tongue, or throat Gallbladder problems--severe stomach pain, nausea, vomiting, fever Heart rhythm changes--fast or irregular heartbeat, dizziness, feeling faint or lightheaded, chest pain, trouble breathing High blood sugar (hyperglycemia)--increased thirst or amount of urine, unusual weakness or fatigue, blurry vision Low blood sugar (hypoglycemia)--tremors or shaking, anxiety, sweating, cold or clammy skin, confusion, dizziness, rapid heartbeat Low thyroid levels (hypothyroidism)--unusual weakness or fatigue, increased sensitivity to cold, constipation, hair loss, dry skin, weight gain, feelings of depression Low vitamin B12 level--pain, tingling, or numbness in the hands or feet, muscle weakness, dizziness, confusion, trouble concentrating Oily or light-colored stools, diarrhea, bloating, weight loss Pancreatitis--severe stomach pain that spreads to your back or gets worse after eating or when touched, fever, nausea, vomiting Slow heartbeat--dizziness, feeling faint or lightheaded, confusion, trouble breathing, unusual weakness or fatigue Side effects that usually do not require medical attention (report these to your care team if they continue or are bothersome): Diarrhea Dizziness Headache Nausea Pain, redness, or irritation at injection site Stomach pain This list may not describe all possible side effects. Call your doctor for medical advice about side effects. You may report side effects to FDA at 1-800-FDA-1088. Where should I keep my medication? Keep out of the reach of children and pets. Store in the refrigerator. Protect from light. Allow to come to room temperature naturally. Do not use artificial heat. If protected from light, the injection may be stored between 20 and 30 degrees C (70 and 86 degrees F) for 14 days. After the initial use, throw away  any unused portion of a multiple dose vial after 14 days. Get rid of any unused portions of the ampules after use. To get rid of medications that are no longer needed or have expired: Take the medication to a medication take-back program. Ask your pharmacy or law enforcement to find a location. If you cannot return the medication, ask your pharmacist or care team how to get rid of the medication safely. NOTE: This sheet is a summary. It may not cover all possible information. If you have questions about this medicine, talk to your doctor, pharmacist, or health care provider.  2024 Elsevier/Gold Standard (2023-11-26 00:00:00)

## 2024-07-05 ENCOUNTER — Other Ambulatory Visit: Payer: Self-pay | Admitting: Internal Medicine

## 2024-07-05 ENCOUNTER — Encounter: Payer: Self-pay | Admitting: Internal Medicine

## 2024-07-05 DIAGNOSIS — C786 Secondary malignant neoplasm of retroperitoneum and peritoneum: Secondary | ICD-10-CM

## 2024-07-05 DIAGNOSIS — C569 Malignant neoplasm of unspecified ovary: Secondary | ICD-10-CM

## 2024-07-05 DIAGNOSIS — C801 Malignant (primary) neoplasm, unspecified: Secondary | ICD-10-CM

## 2024-07-05 DIAGNOSIS — Z7189 Other specified counseling: Secondary | ICD-10-CM

## 2024-07-05 DIAGNOSIS — Z515 Encounter for palliative care: Secondary | ICD-10-CM

## 2024-07-05 DIAGNOSIS — Z79899 Other long term (current) drug therapy: Secondary | ICD-10-CM

## 2024-07-05 DIAGNOSIS — C787 Secondary malignant neoplasm of liver and intrahepatic bile duct: Secondary | ICD-10-CM

## 2024-07-05 DIAGNOSIS — Z66 Do not resuscitate: Secondary | ICD-10-CM

## 2024-07-05 MED ORDER — MORPHINE SULFATE (CONCENTRATE) 20 MG/ML PO SOLN: 10.0000 mg | ORAL | 0 refills | Status: DC | PRN

## 2024-07-05 MED ORDER — LORAZEPAM 0.5 MG PO TABS: ORAL_TABLET | ORAL | 1 refills | Status: DC

## 2024-07-05 MED ORDER — DEXAMETHASONE 4 MG PO TABS: 4.0000 mg | ORAL_TABLET | Freq: Every day | ORAL | 1 refills | Status: DC

## 2024-07-05 MED ORDER — METOCLOPRAMIDE HCL 5 MG/5ML PO SOLN: 5.0000 mg | Freq: Three times a day (TID) | ORAL | 0 refills | Status: DC

## 2024-07-05 MED ORDER — FLUCONAZOLE 100 MG PO TABS: 100.0000 mg | ORAL_TABLET | Freq: Every day | ORAL | 0 refills | Status: DC

## 2024-07-05 NOTE — Progress Notes (Deleted)
 GOALS OF CARE  Date of initial consult: 07/05/2024  Patient Name: Tracy Preston DOB:02-05-1935  FMW:[id number]  Age: 89 y.o.  Sex: female  Code status:  Code Status History     Date Active Date Inactive Code Status Order ID Comments User Context   02/10/2024 1303 03/01/2024 1942 Limited: Do not attempt resuscitation (DNR) -DNR-LIMITED -Do Not Intubate/DNI  [id number]  Clayton Tinnie ORN, DO Inpatient   02/05/2024 1952 02/10/2024 1303 Do not attempt resuscitation (DNR) PRE-ARREST INTERVENTIONS DESIRED [id number]  Kenard Zachary PARAS, MD Inpatient   02/04/2024 2112 02/05/2024 1952 Full Code [id number]  Sim Emery CROME, MD ED    Questions for Most Recent Historical Code Status (Order [id number])     Question Answer   If pulseless and not breathing No CPR or chest compressions.   In Pre-Arrest Conditions (Patient Is Breathing and Has A Pulse) Do not intubate. Provide all appropriate non-invasive medical interventions. Avoid ICU transfer unless indicated or required.   Consent: Discussion documented in EHR or advanced directives reviewed            Attending provider: No att. providers found  PCP: Delice Charleston (Inactive)  Sub specialty provider:***   Reason for consultation: {Reason for Consult:23484}   History of present illness:    Palliative review of symptoms  ECOG: {CHL ONC ECOG PS:[id number]}  Pain:   Pain description: ***  Pain intensity [0 - 10]  Dyspnea: {YES NO:22349} Cough: {YES NO:22349} Nutritional status( weight change, appetite, taste disturbance): {YES NO:22349} Oral Symptoms (xerostomia, dysphagia, odynophagia): {YES NO:22349} Nausea/Vomiting: {YES NO:22349} Constipation/diarrhea: {YES NO:22349}  Urination problems: {YES NO:22349} Sleep: {YES NO:22349} Fatigue: {YES NO:22349} Sedation: {YES NO:22349} Cognitive/ memory problems: {YES NO:22349} Anxiety: {YES NO:22349} Depression: {YES NO:22349} Concerns/worries: {YES NO:22349} Other: {YES  NO:22349}     Patient's understanding of medical condition and prognosis:  Know diagnosis: {YES NO:22349}  Knows prognosis: {YES NO:22349}   {Palliative prognosis:23489}  Patient's preference about sharing medical information:  {Patient's preference about sharing medical information:23490} Family's or surrogate's awareness of illness:{YES NO:22349}  Decision making preferences : {Decision making preferences:23491}   Patient understanding of illness and prognosis:{YES NO:22349}   Hopes and concerns *** Previous experiences with end of life decisions.  Attitudes about diminished states and use of aggressive life-sustaining care  Unacceptable states ( eg, unable to thing or communicate or live independently)   Limitations on life sustaining treatments ( should conform to orders):  {Limitations of life sustaining treatments (should conform to orders):23495}   PM Hx:  Past Medical History:  Diagnosis Date   Cancer (HCC)    skin cancer leg -no problems now   GERD (gastroesophageal reflux disease)    mild -no meds   Heart murmur    faint   Hypertension      Hospitalization in the last 6 months:***  Allergies, adverse reactions:  Allergies  Allergen Reactions   Calcium-Containing Compounds Other (See Comments)    Upsets the stomach, so it is not taken very often    Outpatient medications: (Not in a hospital admission)   Infusions:   Scheduled medications:   PRN medication:   Social History:  Social History   Socioeconomic History   Marital status: Married    Spouse name: Not on file   Number of children: Not on file   Years of education: Not on file   Highest education level: Not on file  Occupational History   Not on file  Tobacco Use   Smoking status: Never   Smokeless  tobacco: Not on file  Substance and Sexual Activity   Alcohol use: Yes    Comment: 1 glass wine nightly   Drug use: No   Sexual activity: Not on file  Other Topics  Concern   Not on file  Social History Narrative   Not on file   Social Drivers of Health   Financial Resource Strain: Not on file  Food Insecurity: No Food Insecurity (02/05/2024)   Hunger Vital Sign    Worried About Running Out of Food in the Last Year: Never true    Ran Out of Food in the Last Year: Never true  Transportation Needs: No Transportation Needs (02/05/2024)   PRAPARE - Administrator, Civil Service (Medical): No    Lack of Transportation (Non-Medical): No  Physical Activity: Not on file  Stress: Not on file  Social Connections: Socially Integrated (02/05/2024)   Social Connection and Isolation Panel    Frequency of Communication with Friends and Family: More than three times a week    Frequency of Social Gatherings with Friends and Family: Twice a week    Attends Religious Services: More than 4 times per year    Active Member of Clubs or Organizations: Yes    Attends Banker Meetings: 1 to 4 times per year    Marital Status: Married    Family History:  Family History  Problem Relation Age of Onset   Breast cancer Sister    Breast cancer Sister    Breast cancer Sister    Colon cancer Paternal Aunt     Spiritual History:  Desire for further Chaplaincy support:{YES NO:22349}     Physical Exam:{PE:[id number]}  Laboratory Studies:  Basic or Comprehensive Metabolic Panel:     Component Value Date/Time   NA 132 (L) 06/21/2024 0906   K 4.5 06/21/2024 0906   CL 98 06/21/2024 0906   CO2 24 06/21/2024 0906   BUN 30 (H) 06/21/2024 0906   CREATININE 1.52 (H) 06/21/2024 0906   GLUCOSE 159 (H) 06/21/2024 0906   CALCIUM 8.9 06/21/2024 0906   AST 32 06/21/2024 0906   ALT 54 (H) 06/21/2024 0906   ALKPHOS 257 (H) 06/21/2024 0906   BILITOT <0.2 06/21/2024 0906   PROT 7.4 06/21/2024 0906   ALBUMIN 3.0 (L) 06/21/2024 0906   CBC:    Component Value Date/Time   WBC 14.1 (H) 06/21/2024 0906   WBC 7.3 02/28/2024 0750   HGB 8.5 (L) 06/21/2024 0906    HCT 26.7 (L) 06/21/2024 0906   PLT 529 (H) 06/21/2024 0906   MCV 93.7 06/21/2024 0906   NEUTROABS 10.1 (H) 06/21/2024 0906   LYMPHSABS 2.5 06/21/2024 0906   MONOABS 1.2 (H) 06/21/2024 0906   EOSABS 0.1 06/21/2024 0906   BASOSABS 0.0 06/21/2024 0906   BNP (last 3 results) No results for input(s): PROBNP in the last 8760 hours. CBG: No results for input(s): GLUCAP in the last 72 hours.  Imaging and Other data: Reviewed:{YES NO:22349}   Assessment and Summary of Goals of Care:  Code Status: {Palliative Code status:23503}  Prognosis:{Palliative Care Prognosis:23504}\  Goals of Care:   Recommendations:  Symptom management:***  Disposition:{Palliative dispostion:23505}   Time In: ***   Time Out: ***  Time spent: {Time; 15 min - 8 hours:17441}  Greater than 50%  of this time was spent counseling and coordinating care related to the above assessment and plan.    Almarie General, [place] 7/9/20252:40 PM

## 2024-07-05 NOTE — Progress Notes (Deleted)
 Home Visit Encounter

## 2024-07-05 NOTE — Progress Notes (Signed)
 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  At the time of diagnosis patient had independent level of functioning and was relatively asymptomatic until presentation.  Her malignant obstruction was managed during night during an acute hospitalization with conservative measures including administration of octreotide  and bridging her nutritional status with tPA.  She also received chemotherapy during her acute hospitalization.  Following that hospitalization her malignant obstruction resolved, she was able to resume adequate p.o. intake and experienced a temporary improvement in her functional status, she elected to receive 3 additional chemotherapy treatments and after the fourth chemotherapy declined further treatment due to side effects and impact on her quality of life.  She has been followed by outpatient palliative care in the cancer center for supportive care services and symptom management.  She has been receiving a q. monthly octreotide  injection to manage her malignant obstruction as well as receive chronic steroid dosing.  For the past 4 months she has done remarkably well however in the last 2 weeks has experienced a rapid and precipitous decline in her functional status including profound fatigue and weakness and activity intolerance.  She was previously treated for urinary tract infection which temporarily improved her symptoms but despite this has continued to have poor appetite, worsening abdominal distention and activity intolerance.  Today I am seeing patient upon her request and that of the family due to her precipitous decline.  [facility] and Family are requesting information about hospice services.  Since the time  of diagnosis Tracy Preston and I have had many discussions about the terminal trajectory of her illness and if and when hospice services would be indicated.  At this time she is no longer going to receive chemotherapy and she would like all of her care to be focused on symptom management and quality of life.   Hide today I met with Tracy Preston, her daughters Tracy Preston and Tracy Preston as well as her sister Tracy Preston who is here visiting from [place].  Her sister Tracy Preston is a retired Physicist, medical.  Additionally she has a daughter Tracy Preston who has assisted with her care and decision making.  She also has a son and several son-in-law's who have been very helpful in caregiving and support.   [name] appears to be much weaker than I have ever seen her since initial consultation.  She is lying on her couch, guards her movements, but is extremely pleasant in conversation cognitively appears to be doing fairly well although is having some minor issues with short-term memory loss per family.  She does not complain of any pain dyspnea or nausea.  Her main complaint is severe and profound weakness and fatigue to the degree that is difficult from her to get from the couch to the bathroom or to exert any effort or energy in her activities of daily living.  Her family reports that she is sleeping more and much less active.  She is also experienced a dramatic decrease in her appetite and ability to meet her nutritional needs.  Her abdomen is visibly and palpably more distended with evidence of what is likely ascites and fluid causing tension on her skin which may suggest clinically I disease progression.  She is fortunately not having any overt symptoms of obstruction she had 1 episode of vomiting and diarrhea following  a large meal recently however, but has not had any additional issues when she switched to a shake based, softer diet.  Additionally she appears clinically dehydrated dry mucous membranes, visible weight loss and  frailty.  She denies any peripheral edema, abdominal pain, no urinary symptoms, and has not had any recent issues with constipation.  Assessment and plan:  Peggye has multifactorial severe weakness and fatigue indicative of what is most likely cancer progression, although there may be additional processes such as smoldering infection or metastatic disease causing her symptoms of severe fatigue including metabolic derangements or progressive kidney failure.  Fortunately she is not showing signs of malignant obstruction at this time we will need to keep these monitor very closely she does have evidence of abdominal ascites I did discuss briefly the option of potentially having therapeutic paracentesis however we will wait since she is not having pain from the abdominal distention.  We discussed her frailty and loss of functional status as being the most important indicator of progression towards end-of-life.  At this time I recommend that we engage hospice services for support and care services at home.  Her plan is when she transitions to more active end-of-life and has symptoms to be managed that she be moved to a hospice inpatient unit for that level of care.   Will increase her Decadron  to 4 mg daily, we may need to increase this to higher doses, her goal is to be able to attend a book signing of her daughters on Friday at her home. Will start her on fluconazole  100 mg daily, she appears to have some early thrush on her tongue which may also affecting her appetite as well as her p.o. intake if there is esophageal involvement.  This will be a prophylactic dose that should continue indefinitely as long as she is able to swallow pills. I did a complete medication reconciliation and discontinued both her magnesium  and potassium supplements as those pills are difficult for her to swallow we will maintain her Eliquis  for now including twice daily low-dose Eliquis  to 2.5 mg, will also continue her UTI  prophylaxis with d-mannose and cranberry supplement.  Do not need to continue monthly octreotide  injections. Will start Reglan  5 mg with meals as needed to help with motility and prevent indigestion/nausea Hospice referral placed, [facility]. Primary Attending : CHARLENA General Will prescribe shelf rescue meds including Roxanol and Lorazepam .  Almarie General, DO Palliative Medicine   Time: 90 minutes

## 2024-07-08 ENCOUNTER — Encounter: Payer: Self-pay | Admitting: Internal Medicine

## 2024-07-08 MED ORDER — SMOG ENEMA: RECTAL | 0 refills | Status: DC

## 2024-07-27 ENCOUNTER — Ambulatory Visit

## 2024-07-27 ENCOUNTER — Ambulatory Visit: Admitting: Oncology

## 2024-07-27 ENCOUNTER — Other Ambulatory Visit

## 2024-07-28 DEATH — deceased
